# Patient Record
Sex: Female | Born: 1960 | Race: White | Hispanic: No | Smoking: Former smoker
Health system: Southern US, Community
[De-identification: ages and names within clinical notes are randomized; demographics above are authoritative.]

## PROBLEM LIST (undated history)

## (undated) DIAGNOSIS — K219 Gastro-esophageal reflux disease without esophagitis: Secondary | ICD-10-CM

## (undated) DIAGNOSIS — F32A Depression, unspecified: Secondary | ICD-10-CM

## (undated) DIAGNOSIS — Z8719 Personal history of other diseases of the digestive system: Secondary | ICD-10-CM

## (undated) DIAGNOSIS — M1990 Unspecified osteoarthritis, unspecified site: Secondary | ICD-10-CM

## (undated) DIAGNOSIS — F419 Anxiety disorder, unspecified: Secondary | ICD-10-CM

## (undated) DIAGNOSIS — C801 Malignant (primary) neoplasm, unspecified: Secondary | ICD-10-CM

## (undated) HISTORY — PX: CERVIX SURGERY: SHX593

## (undated) HISTORY — PX: LASIK: SHX215

---

## 1998-03-03 ENCOUNTER — Other Ambulatory Visit: Admission: RE | Admit: 1998-03-03 | Discharge: 1998-03-03 | Payer: Self-pay | Admitting: Obstetrics & Gynecology

## 1999-07-07 ENCOUNTER — Other Ambulatory Visit: Admission: RE | Admit: 1999-07-07 | Discharge: 1999-07-07 | Payer: Self-pay | Admitting: Obstetrics & Gynecology

## 2000-03-14 ENCOUNTER — Ambulatory Visit (HOSPITAL_COMMUNITY): Admission: RE | Admit: 2000-03-14 | Discharge: 2000-03-14 | Payer: Self-pay | Admitting: *Deleted

## 2000-08-13 ENCOUNTER — Other Ambulatory Visit: Admission: RE | Admit: 2000-08-13 | Discharge: 2000-08-13 | Payer: Self-pay | Admitting: Obstetrics & Gynecology

## 2001-11-20 ENCOUNTER — Other Ambulatory Visit: Admission: RE | Admit: 2001-11-20 | Discharge: 2001-11-20 | Payer: Self-pay | Admitting: Obstetrics & Gynecology

## 2002-12-02 ENCOUNTER — Other Ambulatory Visit: Admission: RE | Admit: 2002-12-02 | Discharge: 2002-12-02 | Payer: Self-pay | Admitting: Obstetrics & Gynecology

## 2003-12-04 ENCOUNTER — Other Ambulatory Visit: Admission: RE | Admit: 2003-12-04 | Discharge: 2003-12-04 | Payer: Self-pay | Admitting: Obstetrics & Gynecology

## 2005-01-11 ENCOUNTER — Other Ambulatory Visit: Admission: RE | Admit: 2005-01-11 | Discharge: 2005-01-11 | Payer: Self-pay | Admitting: Obstetrics & Gynecology

## 2006-12-17 ENCOUNTER — Ambulatory Visit: Payer: Self-pay | Admitting: Internal Medicine

## 2006-12-17 LAB — CONVERTED CEMR LAB
Basophils Absolute: 0 10*3/uL (ref 0.0–0.1)
Basophils Relative: 0.7 % (ref 0.0–1.0)
Eosinophils Absolute: 0.2 10*3/uL (ref 0.0–0.6)
Eosinophils Relative: 3.6 % (ref 0.0–5.0)
HCT: 42.7 % (ref 36.0–46.0)
Hemoglobin: 14.8 g/dL (ref 12.0–15.0)
IgA: 175 mg/dL (ref 68–378)
IgE (Immunoglobulin E), Serum: 23.4 intl units/mL (ref 0.0–180.0)
IgG (Immunoglobin G), Serum: 1085 mg/dL (ref 694–1618)
Lymphocytes Relative: 34.9 % (ref 12.0–46.0)
MCHC: 34.7 g/dL (ref 30.0–36.0)
MCV: 88.4 fL (ref 78.0–100.0)
Monocytes Absolute: 0.5 10*3/uL (ref 0.2–0.7)
Monocytes Relative: 7.9 % (ref 3.0–11.0)
Neutro Abs: 3.3 10*3/uL (ref 1.4–7.7)
Neutrophils Relative %: 52.9 % (ref 43.0–77.0)
Platelets: 191 10*3/uL (ref 150–400)
RBC: 4.83 M/uL (ref 3.87–5.11)
RDW: 13.9 % (ref 11.5–14.6)
Tissue Transglutaminase Ab, IgA: <3 units (ref <5)
WBC: 6.2 10*3/uL (ref 4.5–10.5)

## 2006-12-20 ENCOUNTER — Encounter (INDEPENDENT_AMBULATORY_CARE_PROVIDER_SITE_OTHER): Payer: Self-pay | Admitting: Specialist

## 2006-12-20 ENCOUNTER — Ambulatory Visit: Payer: Self-pay | Admitting: Internal Medicine

## 2008-08-07 ENCOUNTER — Encounter: Admission: RE | Admit: 2008-08-07 | Discharge: 2008-08-07 | Payer: Self-pay | Admitting: Obstetrics & Gynecology

## 2009-08-09 ENCOUNTER — Encounter: Admission: RE | Admit: 2009-08-09 | Discharge: 2009-08-09 | Payer: Self-pay | Admitting: Obstetrics & Gynecology

## 2010-09-06 ENCOUNTER — Ambulatory Visit (HOSPITAL_COMMUNITY)
Admission: RE | Admit: 2010-09-06 | Discharge: 2010-09-06 | Payer: Self-pay | Source: Home / Self Care | Admitting: Internal Medicine

## 2010-09-26 ENCOUNTER — Encounter (HOSPITAL_COMMUNITY)
Admission: RE | Admit: 2010-09-26 | Discharge: 2010-10-26 | Payer: Self-pay | Source: Home / Self Care | Attending: Internal Medicine | Admitting: Internal Medicine

## 2011-03-07 ENCOUNTER — Emergency Department (HOSPITAL_COMMUNITY): Payer: 59

## 2011-03-07 ENCOUNTER — Emergency Department (HOSPITAL_COMMUNITY)
Admission: EM | Admit: 2011-03-07 | Discharge: 2011-03-07 | Disposition: A | Discharge status: Home / Self Care | Payer: 59 | Attending: Emergency Medicine | Admitting: Emergency Medicine

## 2011-03-07 DIAGNOSIS — Z79899 Other long term (current) drug therapy: Secondary | ICD-10-CM | POA: Insufficient documentation

## 2011-03-07 DIAGNOSIS — R079 Chest pain, unspecified: Secondary | ICD-10-CM | POA: Insufficient documentation

## 2011-03-07 LAB — CBC
HCT: 40 % (ref 36.0–46.0)
MCHC: 33.3 g/dL (ref 30.0–36.0)
MCV: 87 fL (ref 78.0–100.0)
Platelets: 221 10*3/uL (ref 150–400)
RBC: 4.6 MIL/uL (ref 3.87–5.11)
RDW: 14 % (ref 11.5–15.5)
WBC: 4.7 10*3/uL (ref 4.0–10.5)

## 2011-03-07 LAB — BASIC METABOLIC PANEL
CO2: 22 mEq/L (ref 19–32)
Calcium: 9.2 mg/dL (ref 8.4–10.5)
Chloride: 106 mEq/L (ref 96–112)
Creatinine, Ser: 0.77 mg/dL (ref 0.4–1.2)
GFR calc Af Amer: >60 mL/min (ref >60)
GFR calc non Af Amer: >60 mL/min (ref >60)
Glucose, Bld: 115 mg/dL — ABNORMAL HIGH (ref 70–99)
Sodium: 137 mEq/L (ref 135–145)

## 2011-03-07 LAB — POCT CARDIAC MARKERS
CKMB, poc: <1 ng/mL — ABNORMAL LOW (ref 1.0–8.0)
Myoglobin, poc: 33.3 ng/mL (ref 12–200)
Troponin i, poc: <0.05 ng/mL (ref 0.00–0.09)

## 2011-03-07 LAB — DIFFERENTIAL
Basophils Absolute: 0 10*3/uL (ref 0.0–0.1)
Basophils Relative: 0 % (ref 0–1)
Eosinophils Absolute: 0.2 10*3/uL (ref 0.0–0.7)
Eosinophils Relative: 4 % (ref 0–5)
Lymphocytes Relative: 45 % (ref 12–46)
Lymphs Abs: 2.1 10*3/uL (ref 0.7–4.0)
Monocytes Absolute: 0.3 10*3/uL (ref 0.1–1.0)
Monocytes Relative: 7 % (ref 3–12)

## 2011-03-31 NOTE — Procedures (Signed)
[place]. [facility]  Patient:    Caitlyn Torres, Caitlyn Torres                     MRN: [id number] Proc. Date: 03/14/00 Adm. Date:  [id number] Attending:  Sabino Gasser CC:         Dr. _______________             Guy Franco, P.A., [facility], [place]                           Procedure Report  ADDENDUM:  [name] tells me that her upper abdominal discomfort has been improved fairly significantly and at this point with negative examinations would not plan any other work-up at this point.  But, I did tell  her that if it has not totally cleared over the next month for her to return to my office for further consideration of this discomfort. DD:  03/14/00 TD:  03/15/00 Job: [id number]

## 2011-03-31 NOTE — Procedures (Signed)
[place]  Patient:    Caitlyn Torres, Caitlyn Torres                     MRN: [id number] Proc. Date: 03/14/00 Adm. Date:  [id number] Attending:  Sabino Gasser Dictator:   Sabino Gasser, M.D.             Guy Franco, P.A. at [facility] in [place]                           Procedure Report  PROCEDURE:  Colonoscopy.  INDICATIONS:  Hemoccult positive stools.  ANESTHESIA:  Versed 2.5 mg was given additionally.  DESCRIPTION OF PROCEDURE:  With the patient mildly sedated in the left lateral decubitus position, a rectal examination was performed which was unremarkable. Subsequently, the Olympus videoscopic pediatric colonoscope was inserted into the rectum and passed under direct vision to the cecum.  The cecum was identified by the ileocecal valve and appendiceal orifice, both of which were photographed.  We then entered into the terminal ileum via the ileocecal valve, and this appeared normal as well and was photographed.  From this point the endoscope was then slowly withdrawn, taking circumferential views of the entire colonic mucosa, stopping only in the rectum which appeared normal in direct view, and showed internal hemorrhoids on retroflex view.  The endoscope was straightened and withdrawn.  The patients vital signs and pulse oximetry remained stable.  The patient tolerated the procedure well without apparent complications.  FINDINGS:  Internal hemorrhoids, otherwise unremarkable colonoscopic examination, including the terminal ileum.  PLAN:  We will have the patient follow up with me as needed.  Negative essentially endoscopic examinations, including terminal ileum.  Etiology of Hemoccult positive not clear, but not related to any organic cause other than possibly internal hemorrhoids.  Consider repeat colon cancer screening in this lady in 5-10 years. DD:  03/14/00 TD:  03/15/00 Job: [id number]

## 2011-03-31 NOTE — Procedures (Signed)
[name]  Patient:    Caitlyn Torres, Caitlyn Torres                     MRN: [id number] Proc. Date: 03/14/00 Adm. Date:  [id number] Attending:  Sabino Gasser Dictator:   Sabino Gasser, M.D.             Guy Franco, P.A. at [facility] in [place]                           Procedure Report  PROCEDURE:  Upper endoscopy.  INDICATIONS:  Abdominal pain, Hemoccult positive stools.  ANESTHESIA: 1. Demerol 50 mg intravenously in divided dose. 2. Versed 5 mg intravenously in divided dose.  DESCRIPTION OF PROCEDURE:  With the patient mildly sedated in the left lateral decubitus position the Olympus videoscopic endoscope was inserted in the mouth and passed under direct vision through the esophagus, which appeared normal, into the stomach.  The antrum, duodenal bulb, second portion of the duodenum were approached.  All appeared normal and were photographed.  From this point, the endoscope was slowly withdrawn, taken circumferentially viewing the entire duodenum and mucosa until the endoscope had been pulled back into the stomach, placed in retroflexion to view the stomach from below, and this appeared normal and was photographed.  The endoscope was then straightened and pulled back from distal to proximal stomach, taking circumferential views the entire gastric and subsequently esophageal mucosa, all of which appeared normal.  The patients vital signs and pulse oximetry remained stable.  The patient tolerated the procedure well without apparent complications.  FINDINGS:  Essentially endoscopic examination.  PLAN:  We will proceed with colonoscopy as previously planned. DD:  03/14/00 TD:  03/15/00 Job: [id number]

## 2011-03-31 NOTE — Assessment & Plan Note (Signed)
[facility]                         GASTROENTEROLOGY OFFICE NOTE   JAELYNN, POZO                     MRN:          [id number]  DATE:12/17/2006                            DOB:          1961-01-12    Ms. Caitlyn Torres is a very nice 50 year old white female who is here today for  evaluation of gastroesophageal reflux. It seems to be associated with a  rash, and evaluated by Dr. Willa Rough who  feels it qualifies as idiopathic  urticaria. The patient almost on a daily basis breaks out with pruritic  rash on her upper extremities and torso, which is not related to food.  She has been tested for allergy and is current on Zyrtec 10 mg a day  with complete relief of her symptoms .She also takes ranitidine 150 mg a  day, sometimes twice a day as a H2 receptor antago nist. Zantac also  reduces her  symptoms of reflux. She has been experiencing more  heartburn in mid-chest and substernally, not radiating to the back.  There has been no dysphagia or odynophagia. She has occasional diarrhea.  No family history of Crohn's disease or inflammatory bowel disease. The  patient had gastroesophageal reflux several years ago when she was  evaluated by [facility]. She does not remember name of  the gastroenterologist, but she underwent upper endoscopy and  colonoscopy around 2002. No definite diagnosis was made. She has  multiple allergies including ASPIRIN, TYLENOL, AND IBUPROFEN. Another  complaint has been hemorrhoids.   PAST MEDICAL HISTORY:  Significant for:  1. Depression.  2. Hyperlipidemia.  3. Anxiety.   PAST SURGICAL HISTORY:  No operations.   FAMILY HISTORY:  Negative for colon cancer, inflammatory bowel disease.  There is a hx of alcoholism in maternal uncle and heart disease in  mother and father.   SOCIAL HISTORY:  She is married, has one child. She works as a Armed forces logistics/support/administrative officer. She smokes, but does not drink alcohol more than  socially.   REVIEW OF SYSTEMS:  Positive for allergies, urinary changes, arthritic  complaints, skin rashes as described in History of Present Illness.  Heart rhythm problems, back pain, itching, severe fatigue and leakage of  urine.   PHYSICAL EXAMINATION:  Blood pressure is 102/72, pulse 80 and weight 121  pounds. She was slim, alert and oriented, in no distress.  Sclerae anicteric. Oral cavity is normal. The tongue was papillated;  there were no cheilosis.  There was a pruritic maculo-papular rash on the upper extremities and  torso, which appeared like urticaria.  LUNGS: Clear to auscultation.  COR: Normal S1, normal S2.  ABDOMEN: Soft, with normoactive bowel sounds. No tenderness. Liver edge  at costal margin. Lower abdomen was normal. No palpable masses, stool.  RECTAL: Is normal rectal tone. Stool was soft, Hemoccult negative, no  external hemorrhoids   IMPRESSION:  A 50 year old white female with a history of  gastroesophageal reflux disease, recent exacerbation of a burning  sensation, poorly controlled on ranitidine 150 mg a day. There is no  dysphagia to suggest esophageal stricture. She  has an idiopathic  urticaria  controlled  with Zyrtec .We need to rule out possibility of  eosinophilic gastroenteritis or some sort of an allergic condition such  as sprue, which may be associated with rash, dermatitis herpetiformis.  Her H-pylori test was negative in the past.   PLAN:  1. Tissue  transglutaminase levels as well as total eosinophil count      today.  2. IGE levels.  3. Upper endoscopy scheduled with planned biopsy of the small bowel as      well as of the stomach and esophagus.  4. Samples of Prilosec 20 mg daily as a proton pump inhibitor to treat      her reflux symptoms.     [name]. Juanda Chance, MD  Electronically Signed    DMB/MedQ  DD: 12/17/2006  DT: 12/17/2006  Job #: [id number]   cc:   Meliton Rattan. Willa Rough, M.D.  Corrie Mckusick, M.D.

## 2011-09-15 ENCOUNTER — Other Ambulatory Visit: Payer: Self-pay | Admitting: Obstetrics & Gynecology

## 2016-11-01 ENCOUNTER — Other Ambulatory Visit: Payer: Self-pay | Admitting: Obstetrics & Gynecology

## 2016-11-01 DIAGNOSIS — R928 Other abnormal and inconclusive findings on diagnostic imaging of breast: Secondary | ICD-10-CM

## 2016-11-07 ENCOUNTER — Other Ambulatory Visit: Payer: Self-pay | Admitting: Obstetrics & Gynecology

## 2016-11-07 ENCOUNTER — Ambulatory Visit
Admission: RE | Admit: 2016-11-07 | Discharge: 2016-11-07 | Disposition: A | Discharge status: Home / Self Care | Payer: BC Managed Care – PPO | Source: Ambulatory Visit | Attending: Obstetrics & Gynecology | Admitting: Obstetrics & Gynecology

## 2016-11-07 DIAGNOSIS — R928 Other abnormal and inconclusive findings on diagnostic imaging of breast: Secondary | ICD-10-CM

## 2019-10-07 ENCOUNTER — Other Ambulatory Visit: Payer: Self-pay | Admitting: Obstetrics & Gynecology

## 2019-10-07 DIAGNOSIS — N6310 Unspecified lump in the right breast, unspecified quadrant: Secondary | ICD-10-CM

## 2019-10-16 ENCOUNTER — Other Ambulatory Visit: Payer: BC Managed Care – PPO

## 2019-10-17 ENCOUNTER — Ambulatory Visit
Admission: RE | Admit: 2019-10-17 | Discharge: 2019-10-17 | Disposition: A | Discharge status: Home / Self Care | Payer: BC Managed Care – PPO | Source: Ambulatory Visit | Attending: Obstetrics & Gynecology | Admitting: Obstetrics & Gynecology

## 2019-10-17 ENCOUNTER — Other Ambulatory Visit: Payer: Self-pay

## 2019-10-17 DIAGNOSIS — N6310 Unspecified lump in the right breast, unspecified quadrant: Secondary | ICD-10-CM

## 2022-02-02 ENCOUNTER — Other Ambulatory Visit: Payer: Self-pay | Admitting: Obstetrics & Gynecology

## 2022-02-02 DIAGNOSIS — R928 Other abnormal and inconclusive findings on diagnostic imaging of breast: Secondary | ICD-10-CM

## 2022-02-24 LAB — COLOGUARD: COLOGUARD: NEGATIVE

## 2022-02-28 ENCOUNTER — Ambulatory Visit
Admission: RE | Admit: 2022-02-28 | Discharge: 2022-02-28 | Disposition: A | Discharge status: Home / Self Care | Payer: BC Managed Care – PPO | Source: Ambulatory Visit | Attending: Obstetrics & Gynecology | Admitting: Obstetrics & Gynecology

## 2022-02-28 ENCOUNTER — Other Ambulatory Visit: Payer: Self-pay | Admitting: Obstetrics & Gynecology

## 2022-02-28 DIAGNOSIS — R599 Enlarged lymph nodes, unspecified: Secondary | ICD-10-CM

## 2022-02-28 DIAGNOSIS — N6320 Unspecified lump in the left breast, unspecified quadrant: Secondary | ICD-10-CM

## 2022-02-28 DIAGNOSIS — R928 Other abnormal and inconclusive findings on diagnostic imaging of breast: Secondary | ICD-10-CM

## 2022-03-09 ENCOUNTER — Other Ambulatory Visit: Payer: BC Managed Care – PPO

## 2022-03-13 DIAGNOSIS — C801 Malignant (primary) neoplasm, unspecified: Secondary | ICD-10-CM

## 2022-03-13 HISTORY — DX: Malignant (primary) neoplasm, unspecified: C80.1

## 2022-03-14 ENCOUNTER — Ambulatory Visit
Admission: RE | Admit: 2022-03-14 | Discharge: 2022-03-14 | Disposition: A | Discharge status: Home / Self Care | Payer: BC Managed Care – PPO | Source: Ambulatory Visit | Attending: Obstetrics & Gynecology | Admitting: Obstetrics & Gynecology

## 2022-03-14 DIAGNOSIS — R599 Enlarged lymph nodes, unspecified: Secondary | ICD-10-CM

## 2022-03-14 DIAGNOSIS — N6320 Unspecified lump in the left breast, unspecified quadrant: Secondary | ICD-10-CM

## 2022-03-21 ENCOUNTER — Telehealth: Payer: Self-pay | Admitting: Hematology and Oncology

## 2022-03-21 NOTE — Telephone Encounter (Signed)
Scheduled appt per 5/9 staff msg from nurse navigator. Pt is aware of appt date and time. Pt is aware to arrive 15 mins prior to appt time and to bring and updated insurance card. Pt is aware of appt location.   ?

## 2022-03-27 ENCOUNTER — Ambulatory Visit: Payer: Self-pay | Admitting: Surgery

## 2022-03-29 ENCOUNTER — Encounter: Payer: Self-pay | Admitting: Radiology

## 2022-03-29 ENCOUNTER — Inpatient Hospital Stay: Payer: BC Managed Care – PPO | Attending: Hematology and Oncology | Admitting: Hematology and Oncology

## 2022-03-29 ENCOUNTER — Other Ambulatory Visit: Payer: Self-pay

## 2022-03-29 VITALS — BP 148/78 | HR 75 | Temp 97.7°F | Resp 18 | Wt 122.4 lb

## 2022-03-29 DIAGNOSIS — Z170 Estrogen receptor positive status [ER+]: Secondary | ICD-10-CM | POA: Insufficient documentation

## 2022-03-29 DIAGNOSIS — C50412 Malignant neoplasm of upper-outer quadrant of left female breast: Secondary | ICD-10-CM | POA: Diagnosis present

## 2022-03-29 MED ORDER — LIDOCAINE-PRILOCAINE 2.5-2.5 % EX CREA: TOPICAL_CREAM | CUTANEOUS | 3 refills | Status: DC

## 2022-03-29 MED ORDER — DEXAMETHASONE 4 MG PO TABS: 4.0000 mg | ORAL_TABLET | Freq: Every day | ORAL | 0 refills | Status: DC

## 2022-03-29 MED ORDER — PROCHLORPERAZINE MALEATE 10 MG PO TABS: 10.0000 mg | ORAL_TABLET | Freq: Four times a day (QID) | ORAL | 1 refills | Status: DC | PRN

## 2022-03-29 MED ORDER — ONDANSETRON HCL 8 MG PO TABS: 8.0000 mg | ORAL_TABLET | Freq: Two times a day (BID) | ORAL | 1 refills | Status: DC | PRN

## 2022-03-29 MED ORDER — LETROZOLE 2.5 MG PO TABS: 2.5000 mg | ORAL_TABLET | Freq: Every day | ORAL | 3 refills | Status: DC

## 2022-03-29 NOTE — Progress Notes (Signed)
[place] ?CONSULT NOTE ? ?Patient Care Team: ?Bradd Canary, MD as PCP - General (Family Medicine) ? ?CHIEF COMPLAINTS/PURPOSE OF CONSULTATION:  ?Newly diagnosed breast cancer ? ?HISTORY OF PRESENTING ILLNESS:  ?Caitlyn Torres 61 y.o. female is here because of recent diagnosis of left breast cancer.  She had a screening mammogram that detected 2 breast masses 1.5 and 1.3 cm with enlarged left axillary lymph node.  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  She is here accompanied by her husband and an patient of mine to discuss the treatment plan. ? ?I reviewed her records extensively and collaborated the history with the patient. ? ?SUMMARY OF ONCOLOGIC HISTORY: ?Oncology History  ?Malignant neoplasm of upper-outer quadrant of left breast in female, estrogen receptor positive ([place])  ?03/14/2022 Initial Diagnosis  ? Screening detected left breast masses 1.5 cm and 1.3 cm with enlarged left axillary lymph node, biopsy revealed grade 3 IDC with lymph node being positive, ER 95%, PR 0%, HER2 2+ by IHC and FISH positive with a ratio 2.45 and a copy #4.9, the second biopsy was HER2 negative with a ratio 2.15 and a copy #3.55 ?  ? ? ? ?MEDICAL HISTORY:  ? anxiety, arthritis ? ?SURGICAL HISTORY: ?  [place]  ? Skin Cancer 08/2020  ? ?Allergies  ?Allergen Reactions  ? Acetaminophen Swelling  ? Aspirin Swelling  ? Nsaids (Non-Steroidal Anti-Inflammatory Drug) Swelling  ? Cpm-Diphenhyd-Pse-Acetaminophn Hives  ? Ibuprofen Unknown  ? Macrolide Antibiotics Other (See Comments)  ?Flu like sx  ? Naproxen Sodium Unknown  ? Nitrofurantoin Monohyd/M-Cryst Other (See Comments) and Unknown  ?Flu like symptoms, fever 103 ? ? ?Current Outpatient Medications on File Prior to Visit  ?Medication Sig Dispense Refill  ? biotin 10 mg Tab Take by mouth  ? cetirizine (ZYRTEC) 10 MG tablet Take 10 mg by mouth once  daily  ? EPINEPHrine (EPIPEN) 0.3 mg/0.3 mL auto-injector as directed  ? multivitamin tablet Take 1 tablet by mouth once daily  ? ?No current facility-administered medications on file prior to visit.  ? ?History reviewed. No pertinent family history.  ? ?REVIEW OF SYSTEMS:   ?Constitutional: Denies fevers, chills or abnormal night sweats ?  ?All other systems were reviewed with the patient and are negative. ? ?PHYSICAL EXAMINATION: ?ECOG PERFORMANCE STATUS: 1 - Symptomatic but completely ambulatory ? ?Vitals:  ? 03/29/22 1458  ?BP: (!) 148/78  ?Pulse: 75  ?Resp: 18  ?Temp: 97.7 ?F (36.5 ?C)  ?SpO2: 100%  ? ?Filed Weights  ? 03/29/22 1458  ?Weight: 122 lb 6.4 oz (55.5 kg)  ? ?  ? ?LABORATORY DATA:  ?I have reviewed the data as listed ?Lab Results  ?Component Value Date  ? WBC 4.7 03/07/2011  ? HGB 13.3 03/07/2011  ? HCT 40.0 03/07/2011  ? MCV 87.0 03/07/2011  ? PLT 221 03/07/2011  ? ?Lab Results  ?Component Value Date  ? NA 137 03/07/2011  ? K 3.7 03/07/2011  ? CL 106 03/07/2011  ? CO2 22 03/07/2011  ? ? ?RADIOGRAPHIC STUDIES: ?I have personally reviewed the radiological reports and agreed with the findings in the report. ? ?ASSESSMENT AND PLAN:  ?Malignant neoplasm of upper-outer quadrant of left breast in female, estrogen receptor positive ([place]) ?03/14/2022:Screening detected left breast masses 1.5 cm and 1.3 cm with enlarged left axillary lymph node, biopsy revealed grade 3 IDC with lymph node being positive, ER 95%, PR  0%, HER2 2+ by IHC and FISH positive with a ratio 2.45 and a copy #4.9, the second biopsy was HER2 negative with a ratio 2.15 and a copy #3.55 ? ?Pathology and radiology counseling: Discussed with the patient, the details of pathology including the type of breast cancer,the clinical staging, the significance of ER, PR and HER-2/neu receptors and the implications for treatment. After reviewing the pathology in detail, we proceeded to discuss the different treatment options between surgery, radiation,  chemotherapy, antiestrogen therapies. ? ?Recommendation based on multidisciplinary tumor board: ?1. Neoadjuvant chemotherapy with [place] ?6 cycles followed by Herceptin Perjeta maintenance versus Kadcyla maintenance (based on response to neoadjuvant chemo) for 1 year ?2. Followed by breast conserving surgery if possible with sentinel lymph node study ?3. Followed by adjuvant radiation therapy if patient had lumpectomy ?4.  Adjuvant antiestrogen therapy with neratinib ? ?Chemotherapy Counseling: I discussed the risks and benefits of chemotherapy including the risks of nausea/ vomiting, risk of infection from low WBC count, fatigue due to chemo or anemia, bruising or bleeding due to low platelets, mouth sores, loss/ change in taste and decreased appetite. Liver and kidney function will be monitored through out chemotherapy as abnormalities in liver and kidney function may be a side effect of treatment. Cardiac dysfunction due to Herceptin and Perjeta and neuropathy risk from Taxotere were discussed in detail. Risk of permanent bone marrow dysfunction due to chemo were also discussed. ? ?Plan: ?1. Port placement ?2. Echocardiogram ?3. Chemotherapy class ?4. Breast MRI ? ?I encouraged her to participate in the nausea clinical trial ?  ?She is currently moving from [place] to [place]. ?Therefore we will start her on letrozole today. ?Return to clinic in 2 weeks to start chemotherapy. ? ? ? ? ?All questions were answered. The patient knows to call the clinic with any problems, questions or concerns. ?  ? Harriette Ohara, MD ?03/29/22 ? I Gardiner Coins am scribing for Dr. Lindi Adie ? ?I have reviewed the above documentation for accuracy and completeness, and I agree with the above. ?  ?

## 2022-03-29 NOTE — Progress Notes (Signed)
START ON PATHWAY REGIMEN - Breast     Cycle 1: A cycle is 21 days:     Pertuzumab      Trastuzumab-xxxx      Docetaxel      Carboplatin    Cycles 2 through 6: A cycle is every 21 days:     Pertuzumab      Trastuzumab-xxxx      Docetaxel      Carboplatin   **Always confirm dose/schedule in your pharmacy ordering system**  Patient Characteristics: Preoperative or Nonsurgical Candidate (Clinical Staging), Neoadjuvant Therapy followed by Surgery, Invasive Disease, Chemotherapy, HER2 Positive, ER Positive Therapeutic Status: Preoperative or Nonsurgical Candidate (Clinical Staging) AJCC M Category: cM0 AJCC Grade: G3 Breast Surgical Plan: Neoadjuvant Therapy followed by Surgery ER Status: Positive (+) AJCC 8 Stage Grouping: IIA HER2 Status: Positive (+) AJCC T Category: cT1c AJCC N Category: cN1 PR Status: Negative (-) Intent of Therapy: Curative Intent, Discussed with Patient 

## 2022-03-29 NOTE — Research (Signed)
[id number] - TREATMENT OF REFRACTORY NAUSEA ? ?03/29/2022 ? ?[place] was identified by Dr. Lindi Adie as a potential candidate for the above listed study.  This Clinical Research Coordinator met with VIKA BUSKE, [id number], on 03/29/22 in a manner and location that ensures patient privacy to discuss participation in the above listed research study.  Patient is Accompanied by her husband and daughters .  A copy of the informed consent document and separate HIPAA Authorization was provided to the patient.  Patient reads, speaks, and understands [place]. Patient was provided with the business card of this Coordinator and encouraged to contact the research team with any questions.  Approximately 10 minutes were spent with the patient reviewing the informed consent documents.  Patient was provided the option of taking informed consent documents home to review and was encouraged to review at their convenience with their support network, including other care providers. Patient took the consent documents home to review. Informed patient this coordinator will follow up at a later date. Patient and family were thanked for their time. This coordinator will review chart to ensure patient meets eligibility if she is interested in participation.  ? ?Carol Ada, RT(R)(T) ?Clinical Research Coordinator ? ?

## 2022-03-29 NOTE — Assessment & Plan Note (Signed)
03/14/2022:Screening detected left breast masses 1.5 cm and 1.3 cm with enlarged left axillary lymph node, biopsy revealed grade 3 IDC with lymph node being positive, ER 95%, PR 0%, HER2 2+ by IHC and FISH positive with a ratio 2.45 and a copy #4.9, the second biopsy was HER2 negative with a ratio 2.15 and a copy #3.55 ? ?Pathology and radiology counseling: Discussed with the patient, the details of pathology including the type of breast cancer,the clinical staging, the significance of ER, PR and HER-2/neu receptors and the implications for treatment. After reviewing the pathology in detail, we proceeded to discuss the different treatment options between surgery, radiation, chemotherapy, antiestrogen therapies. ? ?Recommendation based on multidisciplinary tumor board: ?1. Neoadjuvant chemotherapy with [place] ?6 cycles followed by Herceptin Perjeta maintenance versus Kadcyla maintenance (based on response to neoadjuvant chemo) for 1 year ?2. Followed by breast conserving surgery if possible with sentinel lymph node study ?3. Followed by adjuvant radiation therapy if patient had lumpectomy ?4.  Adjuvant antiestrogen therapy with neratinib ? ?Chemotherapy Counseling: I discussed the risks and benefits of chemotherapy including the risks of nausea/ vomiting, risk of infection from low WBC count, fatigue due to chemo or anemia, bruising or bleeding due to low platelets, mouth sores, loss/ change in taste and decreased appetite. Liver and kidney function will be monitored through out chemotherapy as abnormalities in liver and kidney function may be a side effect of treatment. Cardiac dysfunction due to Herceptin and Perjeta and neuropathy risk from Taxotere were discussed in detail. Risk of permanent bone marrow dysfunction due to chemo were also discussed. ? ?Plan: ?1. Port placement ?2. Echocardiogram ?3. Chemotherapy class ?4. Breast MRI ? ?I encouraged her to participate in the nausea clinical trial ?I also  encouraged her to participate in the blood draw study ? ?Return to clinic in 2 weeks to start chemotherapy. ? ? ? ?

## 2022-03-30 ENCOUNTER — Telehealth: Payer: Self-pay | Admitting: Genetic Counselor

## 2022-03-30 ENCOUNTER — Telehealth: Payer: Self-pay | Admitting: *Deleted

## 2022-03-30 ENCOUNTER — Encounter: Payer: Self-pay | Admitting: *Deleted

## 2022-03-30 DIAGNOSIS — Z170 Estrogen receptor positive status [ER+]: Secondary | ICD-10-CM

## 2022-03-30 NOTE — Telephone Encounter (Signed)
Spoke to pt regarding future appts. Scheduled and confirmed chemo class after echo. Confirmed remainder of scheduled appts. Denies questions or concerns regarding dx or treatment care plan. Encourage pt to call with needs. Received verbal understanding. Contact information provided.

## 2022-03-30 NOTE — Therapy (Signed)
OUTPATIENT PHYSICAL THERAPY BREAST CANCER BASELINE EVALUATION   Patient Name: Caitlyn Torres MRN: [id number] DOB:1960-12-30, 61 y.o., female Today's Date: 03/31/2022   PT End of Session - 03/31/22 0908     Visit Number 1    Number of Visits 2    Date for PT Re-Evaluation 09/29/22    PT Start Time 0901    PT Stop Time 0947    PT Time Calculation (min) 46 min    Activity Tolerance Patient tolerated treatment well    Behavior During Therapy [facility] for tasks assessed/performed             No past medical history on file. No past surgical history on file. Patient Active Problem List   Diagnosis Date Noted   Malignant neoplasm of upper-outer quadrant of left breast in female, estrogen receptor positive ([place]) 03/29/2022    PCP: Sidney Ace, MD  REFERRING PROVIDER: Nicholas Lose MD  REFERRING DIAG: Left Breast Cancer  THERAPY DIAG:  Malignant neoplasm of upper-outer quadrant of left female breast, unspecified estrogen receptor status ([place])  Abnormal posture  Rationale for Evaluation and Treatment Rehabilitation  ONSET DATE: 03/14/2022  SUBJECTIVE                                                                                                                                                                                           SUBJECTIVE STATEMENT: Patient reports she is here today to be seen by her medical team for her newly diagnosed left breast cancer.   PERTINENT HISTORY:  03/14/2022:Screening detected left breast masses 1.5 cm and 1.3 cm with enlarged left axillary lymph node, biopsy revealed grade 3 IDC with lymph node being positive, ER 95%, PR 0%, HER2 2+ by IHC and FISH positive with a ratio 2.45 and a copy #4.9, the second biopsy was HER2 negative with a ratio 2.15 and a copy #3.55. based on multidisciplinary tumor board: 1. Neoadjuvant chemotherapy with TCH Perjeta 6 cycles followed by Herceptin Perjeta maintenance versus Kadcyla maintenance (based on  response to neoadjuvant chemo) for 1 year 2. Followed by breast conserving surgery if possible with sentinel lymph node study 3. Followed by adjuvant radiation therapy if patient had lumpectomy 4.  Adjuvant antiestrogen therapy with neratinib    PATIENT GOALS   reduce lymphedema risk and learn post op HEP.   PAIN:  Are you having pain? No   PRECAUTIONS: Active CA None  HAND DOMINANCE: right  WEIGHT BEARING RESTRICTIONS No  FALLS:  Has patient fallen in last 6 months? No  LIVING ENVIRONMENT: Patient lives with: partner Lives in: House/apartment Has following equipment at home: none  OCCUPATION:  retired Electrical engineer, Human resources officer for [employer]: walking, out to eat  PRIOR LEVEL OF FUNCTION: Independent   OBJECTIVE  COGNITION:  Overall cognitive status: Within functional limits for tasks assessed    POSTURE:  Forward head and rounded shoulders posture  UPPER EXTREMITY AROM/PROM:  A/PROM RIGHT   eval   Shoulder extension 47  Shoulder flexion 163  Shoulder abduction 180  Shoulder internal rotation 67  Shoulder external rotation 107    (Blank rows = not tested)  A/PROM LEFT   eval  Shoulder extension 60  Shoulder flexion 165  Shoulder abduction 180  Shoulder internal rotation 57  Shoulder external rotation 107    (Blank rows = not tested)   CERVICAL AROM: All within normal limits:      UPPER EXTREMITY STRENGTH: WNL   LYMPHEDEMA ASSESSMENTS:   LANDMARK RIGHT   Eval 03/31/2022  10 cm proximal to olecranon process 24.0  Olecranon process 22.3  10 cm proximal to ulnar styloid process 20.9  Just proximal to ulnar styloid process 14.6  Across hand at thumb web space 19.3  At base of 2nd digit 5.9  (Blank rows = not tested)  LANDMARK LEFT   Eval 03/31/2022  10 cm proximal to olecranon process 23.6  Olecranon process 21.6  10 cm proximal to ulnar styloid process 19.5  Just proximal to ulnar styloid process 14.15  Across hand  at thumb web space 18.45  At base of 2nd digit 5.7  (Blank rows = not tested)   L-DEX LYMPHEDEMA SCREENING:  The patient was assessed using the L-Dex machine today to produce a lymphedema index baseline score. The patient will be reassessed on a regular basis (typically every 3 months) to obtain new L-Dex scores. If the score is > 6.5 points away from his/her baseline score indicating onset of subclinical lymphedema, it will be recommended to wear a compression garment for 4 weeks, 12 hours per day and then be reassessed. If the score continues to be > 6.5 points from baseline at reassessment, we will initiate lymphedema treatment. Assessing in this manner has a 95% rate of preventing clinically significant lymphedema.   L-DEX FLOWSHEETS - 03/31/22 0900       L-DEX LYMPHEDEMA SCREENING   Measurement Type Unilateral    L-DEX MEASUREMENT EXTREMITY Upper Extremity    POSITION  Standing    DOMINANT SIDE Right    At Risk Side Left    BASELINE SCORE (UNILATERAL) -2.1              QUICK DASH SURVEY:6.82   PATIENT EDUCATION:  Education details: Lymphedema risk reduction and post op shoulder/posture HEP, compression BRA, SOZO screens Person educated: Patient Education method: Explanation, Demonstration, Handout Education comprehension: Patient verbalized understanding and returned demonstration   HOME EXERCISE PROGRAM: Patient was instructed today in a home exercise program today for post op shoulder range of motion. These included active assist shoulder flexion in sitting, scapular retraction, wall walking with shoulder abduction, and hands behind head external rotation.  She was encouraged to do these twice a day, holding 3 seconds and repeating 5 times when permitted by her physician.   ASSESSMENT:  CLINICAL IMPRESSION: Her multidisciplinary medical team met prior to her assessments to determine a recommended treatment plan. She is planning to have neoadjuvant chemotherapy  followed by surgery planning a left lumpectomy with SLNB but is unsure right now and possible radiation. She will benefit from a post op PT reassessment to determine needs and from L-Dex screens  every 3 months for 2 years to detect subclinical lymphedema.  Pt will benefit from skilled therapeutic intervention to improve on the following deficits: Decreased knowledge of precautions, impaired UE functional use, pain, decreased ROM, postural dysfunction.   PT treatment/interventions: ADL/self-care home management, pt/family education, therapeutic exercise  REHAB POTENTIAL: Excellent  CLINICAL DECISION MAKING: Stable/uncomplicated  EVALUATION COMPLEXITY: Low   GOALS: Goals reviewed with patient? YES  LONG TERM GOALS: (STG=LTG)    Name Target Date Goal status  1 Pt will be able to verbalize understanding of pertinent lymphedema risk reduction practices relevant to her dx specifically related to skin care.  Baseline:  No knowledge 03/31/2022 Achieved at eval  2 Pt will be able to return demo and/or verbalize understanding of the post op HEP related to regaining shoulder ROM. Baseline:  No knowledge 03/31/2022 Achieved at eval  3 Pt will be able to verbalize understanding of the importance of attending the post op After Breast CA Class for further lymphedema risk reduction education and therapeutic exercise.  Baseline:  No knowledge 03/31/2022 Achieved at eval  4 Pt will demo she has regained full shoulder ROM and function post operatively compared to baselines.  Baseline: See objective measurements taken today. 09/29/2022 INITIAL     PLAN: PT FREQUENCY/DURATION: EVAL and 1 follow up appointment.   PLAN FOR NEXT SESSION: will reassess 3-4 weeks post op to determine needs.   Patient will follow up at outpatient cancer rehab 3-4 weeks following surgery.  If the patient requires physical therapy at that time, a specific plan will be dictated and sent to the referring physician for  approval. The patient was educated today on appropriate basic range of motion exercises to begin post operatively and the importance of attending the After Breast Cancer class following surgery.  Patient was educated today on lymphedema risk reduction practices as it pertains to recommendations that will benefit the patient immediately following surgery.  She verbalized good understanding.    Physical Therapy Information for After Breast Cancer Surgery/Treatment:  Lymphedema is a swelling condition that you may be at risk for in your arm if you have lymph nodes removed from the armpit area.  After a sentinel node biopsy, the risk is approximately 5-9% and is higher after an axillary node dissection.  There is treatment available for this condition and it is not life-threatening.  Contact your physician or physical therapist with concerns. You may begin the 4 shoulder/posture exercises (see additional sheet) when permitted by your physician (typically a week after surgery).  If you have drains, you may need to wait until those are removed before beginning range of motion exercises.  A general recommendation is to not lift your arms above shoulder height until drains are removed.  These exercises should be done to your tolerance and gently.  This is not a "no pain/no gain" type of recovery so listen to your body and stretch into the range of motion that you can tolerate, stopping if you have pain.  If you are having immediate reconstruction, ask your plastic surgeon about doing exercises as he or she may want you to wait. We encourage you to attend the free one time ABC (After Breast Cancer) class offered by [facility].  You will learn information related to lymphedema risk, prevention and treatment and additional exercises to regain mobility following surgery.  You can call [phone] for more information.  This is offered the 1st and 3rd Monday of each month.  You only attend  the  class one time. While undergoing any medical procedure or treatment, try to avoid blood pressure being taken or needle sticks from occurring on the arm on the side of cancer.   This recommendation begins after surgery and continues for the rest of your life.  This may help reduce your risk of getting lymphedema (swelling in your arm). An excellent resource for those seeking information on lymphedema is the National Lymphedema Network's web site. It can be accessed at [place].org If you notice swelling in your hand, arm or breast at any time following surgery (even if it is many years from now), please contact your doctor or physical therapist to discuss this.  Lymphedema can be treated at any time but it is easier for you if it is treated early on.  If you feel like your shoulder motion is not returning to normal in a reasonable amount of time, please contact your surgeon or physical therapist.  [facility] [phone]. [address], [place] [address]  ABC CLASS After Breast Cancer Class  After Breast Cancer Class is a specially designed exercise class to assist you in a safe recover after having breast cancer surgery.  In this class you will learn how to get back to full function whether your drains were just removed or if you had surgery a month ago.  This one-time class is held the 1st and 3rd Monday of every month from 11:00 a.m. until 12:00 noon virtually.  This class is FREE and space is limited. For more information or to register for the next available class, call [phone].  Class Goals  Understand specific stretches to improve the flexibility of you chest and shoulder. Learn ways to safely strengthen your upper body and improve your posture. Understand the warning signs of infection and why you may be at risk for an arm infection. Learn about Lymphedema and prevention.  ** You do not attend this class until after surgery.   Drains must be removed to participate  Patient was instructed today in a home exercise program today for post op shoulder range of motion. These included active assist shoulder flexion in sitting, scapular retraction, wall walking with shoulder abduction, and hands behind head external rotation.  She was encouraged to do these twice a day, holding 3 seconds and repeating 5 times when permitted by her physician.    Claris Pong, PT 03/31/2022, 9:56 AM

## 2022-03-30 NOTE — Progress Notes (Incomplete)
Location of Breast Cancer:  Malignant neoplasm of upper-outer quadrant of left breast in female, estrogen receptor positive  Histology per Pathology Report:  03/14/2022 1. Breast, left, needle core biopsy, 2:00 - INVASIVE DUCTAL CARCINOMA - SEE COMMENT 2. Breast, left, needle core biopsy, 2:30 - INVASIVE DUCTAL CARCINOMA - SEE COMMENT 3. Lymph node, needle/core biopsy, left axilla - METASTATIC CARCINOMA INVOLVING NODAL TISSUE Microscopic Comment 1. and 2. The carcinoma has a syncytial growth pattern. Based on the biopsy, the carcinoma appears Nottingham grade 3 of 3 and measures 1.0 cm in greatest linear extent.  Receptor Status:  Left breast, 2 o'clock: ER(95%), PR (0%), Her2-neu (Positive via FISH), Ki-67(95%) Left breast, 2:30: ER(95%), PR (0%), Her2-neu (Negative via FISH)  Did patient present with symptoms (if so, please note symptoms) or was this found on screening mammography?: Screening mammogram detected 2 breast masses 1.5 and 1.3 cm with enlarged left axillary lymph node.  Past/Anticipated interventions by surgeon, if any:  03/27/2022 --Dr. Erroll Luna (office visit) Patient is scheduled to see oncology on Wednesday. She appears to be a good neoadjuvant chemotherapy candidate. I discussed this with her today as well as port placement for administration of this.  I discussed breast conserving surgery with her, the management of the axilla in her circumstance given a positive node, as well as mastectomy and reconstruction.  I recommend genetic counseling as well for her given her sisters history as well as hers.  She will think all these options over.  Discussed port placement and the pros and cons of this and the rationale for placing a port for chemotherapy.  Risks and benefits of the surgery discussed.  Discussed breast conserving surgery as well with the goal of chemotherapy to downstage her disease.  She will see oncology and let me know about her final decision  when she is seen in the oncologist.  Past/Anticipated interventions by medical oncology, if any:  Under care of Dr. Nicholas Lose 03/29/2022 --Recommendation based on multidisciplinary tumor board: Neoadjuvant chemotherapy with [place] Perjeta 6 cycles followed by Herceptin Perjeta maintenance versus Kadcyla maintenance (based on response to neoadjuvant chemo) for 1 year Followed by breast conserving surgery if possible with sentinel lymph node study Followed by adjuvant radiation therapy if patient had lumpectomy Adjuvant antiestrogen therapy with neratinib --Plan: Port placement Echocardiogram Chemotherapy class Breast MRI Scheduled for 04/07/2022  I encouraged her to participate in the nausea clinical trial She is currently moving from [place] to [place] Therefore we will start her on letrozole today Return to clinic in 2 weeks to start chemotherapy  Lymphedema issues, if any:  ***    Pain issues, if any:  ***   SAFETY ISSUES: Prior radiation? *** Pacemaker/ICD? *** Possible current pregnancy? No--postmenopausal Is the patient on methotrexate? ***  Current Complaints / other details:  ***

## 2022-03-30 NOTE — Telephone Encounter (Signed)
Scheduled appointment per inbasket message. Left message.   ?

## 2022-03-31 ENCOUNTER — Ambulatory Visit: Payer: BC Managed Care – PPO | Attending: Hematology and Oncology

## 2022-03-31 DIAGNOSIS — C50412 Malignant neoplasm of upper-outer quadrant of left female breast: Secondary | ICD-10-CM | POA: Insufficient documentation

## 2022-03-31 DIAGNOSIS — Z170 Estrogen receptor positive status [ER+]: Secondary | ICD-10-CM | POA: Insufficient documentation

## 2022-03-31 DIAGNOSIS — R293 Abnormal posture: Secondary | ICD-10-CM | POA: Insufficient documentation

## 2022-04-03 ENCOUNTER — Ambulatory Visit: Payer: BC Managed Care – PPO

## 2022-04-03 ENCOUNTER — Telehealth: Payer: Self-pay | Admitting: Radiology

## 2022-04-03 ENCOUNTER — Ambulatory Visit: Payer: BC Managed Care – PPO | Admitting: Radiation Oncology

## 2022-04-03 NOTE — Telephone Encounter (Signed)
[id number] - TREATMENT OF REFRACTORY NAUSEA   04/03/2022  PHONE CALL: Confirmed I was speaking with Caitlyn Torres . Informed patient reason for call is to follow-up on the above mentioned study. Patient has not had a chance to read over consent, this clinical research coordinator expressed understanding. Patient requested a call back tomorrow around mid-day. Thanked patient for her time and I look forward to speaking with patient tomorrow.  Carol Ada, RT(R)(T) Clinical Research Coordinator

## 2022-04-04 ENCOUNTER — Encounter: Payer: Self-pay | Admitting: Licensed Clinical Social Worker

## 2022-04-04 ENCOUNTER — Encounter: Payer: Self-pay | Admitting: *Deleted

## 2022-04-04 NOTE — Progress Notes (Signed)
[facility] Work  Initial Assessment   Caitlyn Torres is a 61 y.o. year old female contacted by phone. Clinical Social Work was referred by  new pt protocol  for assessment of psychosocial needs.   SDOH (Social Determinants of Health) assessments performed: Yes SDOH Interventions    Flowsheet Row Most Recent Value  SDOH Interventions   Food Insecurity Interventions Intervention Not Indicated  Financial Strain Interventions Intervention Not Indicated  Housing Interventions Intervention Not Indicated  Transportation Interventions Intervention Not Indicated       SDOH Screenings   Alcohol Screen: Not on file  Depression (PHQ2-9): Not on file  Financial Resource Strain: Low Risk    Difficulty of Paying Living Expenses: Not very hard  Food Insecurity: No Food Insecurity   Worried About Running Out of Food in the Last Year: Never true   Ran Out of Food in the Last Year: Never true  Housing: Low Risk    Last Housing Risk Score: 0  Physical Activity: Not on file  Social Connections: Not on file  Stress: Not on file  Tobacco Use: Not on file  Transportation Needs: No Transportation Needs   Lack of Transportation (Medical): No   Lack of Transportation (Non-Medical): No     Distress Screen completed: No     View : No data to display.            Family/Social Information:  Housing Arrangement: patient lives with partner. They are moving from [place] to [place] prior to tx start (pt works in [employer], family is closer) Family members/support persons in your life? Partner, Family (daughter, parents) and Friends Transportation concerns: no  Employment: Working full time for [employer]. Also retired from working for the state.  Income source: Employment Nurse, children's concerns: No Type of concern: None Food access concerns: no Religious or spiritual practice: Not known Services Currently in place:  n/a  Coping/ Adjustment to  diagnosis: Patient understands treatment plan and what happens next? yes Concerns about diagnosis and/or treatment:  General adjustment to diagnosis Patient reported stressors: Housing- getting ready to move Current coping skills/ strengths: Capable of independent living  and Supportive family/friends     SUMMARY: Current SDOH Barriers:  No significant SDOH barriers noted today  Clinical Social Work Clinical Goal(s):  Patient will work with CSW to continue to adjust to cancer diagnosis and associated treatment  Interventions: Discussed common feeling and emotions when being diagnosed with cancer, and the importance of support during treatment Informed patient of the support team roles and support services at [facility] Provided [place] contact information and encouraged patient to call with any questions or concerns   Follow Up Plan: CSW will see patient on 04/11/2022 for counseling Patient verbalizes understanding of plan: Yes    [name], LCSW

## 2022-04-05 ENCOUNTER — Telehealth: Payer: Self-pay | Admitting: Hematology and Oncology

## 2022-04-05 ENCOUNTER — Telehealth: Payer: Self-pay | Admitting: Radiology

## 2022-04-05 DIAGNOSIS — Z170 Estrogen receptor positive status [ER+]: Secondary | ICD-10-CM

## 2022-04-05 NOTE — Telephone Encounter (Signed)
[id number] - TREATMENT OF REFRACTORY NAUSEA   04/04/22  PHONE CALL: Patient called this clinical research coordinator to follow-up on the above mentioned study. This coordinator answered all questions and explained the process of the study as well as the voluntary nature of study. Patient is interested in participation. Plan to meet with patient on Thursday, 5/25 at 1PM, after her morning appointments. Patient was thanked for her time and consideration of the above mentioned study and I look forward to speaking with patient then.   Carol Ada, RT(R)(T) Clinical Research Coordinator

## 2022-04-05 NOTE — Telephone Encounter (Signed)
.  Called patient to schedule appointment per 5/20 inbasket, patient is aware of date and time.

## 2022-04-06 ENCOUNTER — Inpatient Hospital Stay: Payer: BC Managed Care – PPO | Admitting: Radiology

## 2022-04-06 ENCOUNTER — Inpatient Hospital Stay (HOSPITAL_BASED_OUTPATIENT_CLINIC_OR_DEPARTMENT_OTHER): Payer: BC Managed Care – PPO | Admitting: Hematology and Oncology

## 2022-04-06 ENCOUNTER — Telehealth: Payer: Self-pay | Admitting: *Deleted

## 2022-04-06 ENCOUNTER — Inpatient Hospital Stay: Payer: BC Managed Care – PPO

## 2022-04-06 ENCOUNTER — Ambulatory Visit (HOSPITAL_COMMUNITY)
Admission: RE | Admit: 2022-04-06 | Discharge: 2022-04-06 | Disposition: A | Discharge status: Home / Self Care | Payer: BC Managed Care – PPO | Source: Ambulatory Visit | Attending: Hematology and Oncology | Admitting: Hematology and Oncology

## 2022-04-06 ENCOUNTER — Other Ambulatory Visit: Payer: Self-pay

## 2022-04-06 DIAGNOSIS — C50412 Malignant neoplasm of upper-outer quadrant of left female breast: Secondary | ICD-10-CM | POA: Insufficient documentation

## 2022-04-06 DIAGNOSIS — Z5111 Encounter for antineoplastic chemotherapy: Secondary | ICD-10-CM | POA: Diagnosis not present

## 2022-04-06 DIAGNOSIS — Z0189 Encounter for other specified special examinations: Secondary | ICD-10-CM

## 2022-04-06 DIAGNOSIS — Z170 Estrogen receptor positive status [ER+]: Secondary | ICD-10-CM | POA: Insufficient documentation

## 2022-04-06 LAB — ECHOCARDIOGRAM COMPLETE
AR max vel: 2.11 cm2
AV Area VTI: 2.21 cm2
AV Area mean vel: 2.09 cm2
AV Mean grad: 2 mmHg
AV Peak grad: 3.9 mmHg
Ao pk vel: 0.99 m/s
Area-P 1/2: 3.5 cm2
Calc EF: 63.8 %
S' Lateral: 2.7 cm
Single Plane A2C EF: 70.2 %
Single Plane A4C EF: 53.5 %

## 2022-04-06 NOTE — Telephone Encounter (Signed)
Pt called and requested to meet with Dr. Lindi Adie prior to start of chemo. She has many questions regarding chemo as well as if any other treatments could be added or done other than chemo. Informed pt we can schedule her to see Dr. Lindi Adie at 1pm after chemo education. Received verbal understanding.

## 2022-04-06 NOTE — Progress Notes (Signed)
Echocardiogram 2D Echocardiogram has been performed.  Caitlyn Torres 04/06/2022, 10:11 AM

## 2022-04-06 NOTE — Progress Notes (Signed)
Patient Care Team: Bradd Canary, MD as PCP - General (Family Medicine) Mauro Kaufmann, RN as Oncology Nurse Navigator Rockwell Germany, RN as Oncology Nurse Navigator Nicholas Lose, MD as Consulting Physician (Hematology and Oncology)  DIAGNOSIS:  Encounter Diagnosis  Name Primary?   Malignant neoplasm of upper-outer quadrant of left breast in female, estrogen receptor positive ([place])     SUMMARY OF ONCOLOGIC HISTORY: Oncology History  Malignant neoplasm of upper-outer quadrant of left breast in female, estrogen receptor positive ([place])  03/14/2022 Initial Diagnosis   Screening detected left breast masses 1.5 cm and 1.3 cm with enlarged left axillary lymph node, biopsy revealed grade 3 IDC with lymph node being positive, ER 95%, PR 0%, HER2 2+ by IHC and FISH positive with a ratio 2.45 and a copy #4.9, the second biopsy was HER2 negative with a ratio 2.15 and a copy #3.55   03/29/2022 Cancer Staging   Staging form: Breast, AJCC 8th Edition - Clinical: Stage IIA (cT1c, cN1, cM0, G3, ER+, PR-, HER2+) - Signed by Nicholas Lose, MD on 03/29/2022 Stage prefix: Initial diagnosis Histologic grading system: 3 grade system    04/20/2022 -  Chemotherapy   Patient is on Treatment Plan : BREAST  Docetaxel + Carboplatin + Trastuzumab + Pertuzumab  (TCHP) q21d         CHIEF COMPLIANT: Follow up to discuss [facility] Perjeta   INTERVAL HISTORY: Caitlyn Torres is a 61 y.o. female is here because of recent diagnosis of left breast cancer. She presents to the clinic today for a follow-up to discuss TCHP.  She tells me that she has tremendous number of allergies and therefore she is concerned about going through chemotherapy.  In fact she informed that she does not want to do chemotherapy and would rather only do the Herceptin Perjeta along with the letrozole that she is currently taking.  She is tolerating letrozole fairly well.  Therefore she has decided not to initiate chemotherapy but to start with  the Herceptin and Perjeta along with her letrozole and see what happens.  If she does not respond then she will go on chemo.    ALLERGIES:  is allergic to aleve [naproxen sodium], aspirin, macrobid [nitrofurantoin], motrin [ibuprofen], and tylenol [acetaminophen].  MEDICATIONS:  Current Outpatient Medications  Medication Sig Dispense Refill   carboxymethylcellulose (REFRESH PLUS) 0.5 % SOLN 1 drop 3 (three) times daily as needed (irritation).     cetirizine (ZYRTEC) 10 MG tablet Take 10 mg by mouth daily.     dexamethasone (DECADRON) 4 MG tablet Take 1 tablet (4 mg total) by mouth daily. Take 1 tablet day before chemo and 1 tablet day after chemo with food 12 tablet 0   EPINEPHrine 0.3 mg/0.3 mL IJ SOAJ injection Inject 0.3 mg into the muscle as needed for anaphylaxis.     letrozole (FEMARA) 2.5 MG tablet Take 1 tablet (2.5 mg total) by mouth daily. 90 tablet 3   lidocaine-prilocaine (EMLA) cream Apply to affected area once 30 g 3   olopatadine (PATANOL) 0.1 % ophthalmic solution Place 1 drop into both eyes daily as needed for allergies.     ondansetron (ZOFRAN) 8 MG tablet Take 1 tablet (8 mg total) by mouth 2 (two) times daily as needed (Nausea or vomiting). Start on the third day after chemotherapy. 30 tablet 1   prochlorperazine (COMPAZINE) 10 MG tablet Take 1 tablet (10 mg total) by mouth every 6 (six) hours as needed (Nausea or vomiting). 30 tablet 1  No current facility-administered medications for this visit.    PHYSICAL EXAMINATION: ECOG PERFORMANCE STATUS: 1 - Symptomatic but completely ambulatory  Vitals:   04/06/22 1309  BP: 139/80  Pulse: 63  Resp: 18  Temp: 97.7 F (36.5 C)  SpO2: 100%   Filed Weights   04/06/22 1309  Weight: 122 lb 4.8 oz (55.5 kg)     LABORATORY DATA:  I have reviewed the data as listed    Latest Ref Rng & Units 03/07/2011    5:41 PM  CMP  Glucose 70 - 99 mg/dL 115    BUN 6 - 23 mg/dL 11    Creatinine 0.4 - 1.2 mg/dL 0.77    Sodium 135  - 145 mEq/L 137    Potassium 3.5 - 5.1 mEq/L 3.7    Chloride 96 - 112 mEq/L 106    CO2 19 - 32 mEq/L 22    Calcium 8.4 - 10.5 mg/dL 9.2      Lab Results  Component Value Date   WBC 4.7 03/07/2011   HGB 13.3 03/07/2011   HCT 40.0 03/07/2011   MCV 87.0 03/07/2011   PLT 221 03/07/2011   NEUTROABS 2.0 03/07/2011    ASSESSMENT & PLAN:  Malignant neoplasm of upper-outer quadrant of left breast in female, estrogen receptor positive ([facility]) 03/14/2022:Screening detected left breast masses 1.5 cm and 1.3 cm with enlarged left axillary lymph node, biopsy revealed grade 3 IDC with lymph node being positive, ER 95%, PR 0%, HER2 2+ by IHC and FISH positive with a ratio 2.45 and a copy #4.9, the second biopsy was HER2 negative with a ratio 2.15 and a copy #3.55  Treatment plan based on multidisciplinary tumor board: 1. Neoadjuvant chemotherapy with Herceptin Perjeta and letrozole for 3 cycles.  Reassess with another MRI and then decide if she can continue with the same plan or do we need to add chemotherapy. (Our original recommendation is TCHP x6 cycles but patient is not willing to go through chemo right away) 2. Followed by breast conserving surgery if possible with sentinel lymph node study 3. Followed by adjuvant radiation therapy if patient had lumpectomy 4.  Adjuvant antiestrogen therapy with neratinib   Plan: 1. Port placement 2. Echocardiogram: Normal 3. Chemotherapy class: Done  4. Breast MRI: Tomorrow  CT CAP and bone scan also being planned  Patient will be seeing Dr. Iruku on her first day of her treatment.  She will go through her scans in detail. Patient's husband is focused on holistic approach to treatment. He wants to know all the ingredients in Herceptin and Perjeta.  I informed him that it may not be possible to find out.  Return to clinic in 2 weeks to start chemotherapy.      No orders of the defined types were placed in this encounter.  The patient has a good  understanding of the overall plan. she agrees with it. she will call with any problems that may develop before the next visit here. Total time spent: 30 mins including face to face time and time spent for planning, charting and co-ordination of care   Viinay K Gudena, MD 04/06/22    I Deritra, Mcnairy am scribing for Dr. Gudena  I have reviewed the above documentation for accuracy and completeness, and I agree with the above.   

## 2022-04-06 NOTE — Assessment & Plan Note (Addendum)
03/14/2022:Screening detected left breast masses 1.5 cm and 1.3 cm with enlarged left axillary lymph node, biopsy revealed grade 3 IDC with lymph node being positive, ER 95%, PR 0%, HER2 2+ by IHC and FISH positive with a ratio 2.45 and a copy #4.9, the second biopsy was HER2 negative with a ratio 2.15 and a copy #3.55  Treatment plan based on multidisciplinary tumor board: 1. Neoadjuvant chemotherapy with Herceptin Perjeta and letrozole for 3 cycles.  Reassess with another MRI and then decide if she can continue with the same plan or do we need to add chemotherapy. (Our original recommendation is TCHP x6 cycles but patient is not willing to go through chemo right away) 2. Followed by breast conserving surgery if possible with sentinel lymph node study 3. Followed by adjuvant radiation therapy if patient had lumpectomy 4.  Adjuvant antiestrogen therapy with neratinib   Plan: 1. Port placement 2. Echocardiogram: Normal 3. Chemotherapy class: Done  4. Breast MRI: Tomorrow  CT CAP and bone scan also being planned  Patient will be seeing Dr. Delma Officer on her first day of her treatment.  She will go through her scans in detail. Patient's husband is focused on holistic approach to treatment. He wants to know all the ingredients in Herceptin and Perjeta.  I informed him that it may not be possible to find out.  Return to clinic in 2 weeks to start chemotherapy.

## 2022-04-07 ENCOUNTER — Ambulatory Visit
Admission: RE | Admit: 2022-04-07 | Discharge: 2022-04-07 | Disposition: A | Discharge status: Home / Self Care | Payer: BC Managed Care – PPO | Source: Ambulatory Visit | Attending: Hematology and Oncology | Admitting: Hematology and Oncology

## 2022-04-07 ENCOUNTER — Telehealth: Payer: Self-pay | Admitting: Hematology and Oncology

## 2022-04-07 ENCOUNTER — Telehealth: Payer: Self-pay | Admitting: Radiology

## 2022-04-07 DIAGNOSIS — Z170 Estrogen receptor positive status [ER+]: Secondary | ICD-10-CM

## 2022-04-07 MED ORDER — GADOBUTROL 1 MMOL/ML IV SOLN
5.0000 mL | Freq: Once | INTRAVENOUS | Status: AC | PRN
  Administered 2022-04-07: 5 mL via INTRAVENOUS

## 2022-04-07 NOTE — Telephone Encounter (Signed)
Cancelled appointments per 5/25 los. Left message for patient.

## 2022-04-07 NOTE — Telephone Encounter (Signed)
[id number] - TREATMENT OF REFRACTORY NAUSEA  04/07/22  PHONE CALL: Confirmed I was speaking with Marius Ditch . Informed patient reason for call is to follow-up about the above mentioned study. Patient stated she spoke with Dr. Lindi Adie yesterday and has decided to forgo chemo and do Herceptin + Letrozole. This coordinator expressed understanding and thanked patient for her time and consideration of the above mentioned study.   Carol Ada, RT(R)(T) Clinical Research Coordinator

## 2022-04-07 NOTE — Progress Notes (Signed)
New Breast Cancer Diagnosis: Left Breast UOQ  Did patient present with symptoms (if so, please note symptoms) or screening mammography?: Screening mammogram detected 2 masses in the left breast.   MRI Breast 04/07/2022:   Location and Extent of disease :left breast. Located at 2 o'clock and 2:30 position, measured 1.5 cm and 1.3 cm in greatest dimension. Adenopathy yes.  Histology per Pathology Report: grade 3, Invasive Ductal Carcinoma 03/14/2022  Receptor Status: ER(positive), PR (negative), Her2-neu (positive), Ki-(95%)  Receptor Status: ER(positive), PR (negative), Her2-neu (negative), Ki-(%)   Surgeon and surgical plan, if any:  Dr. Brantley Stage 03/27/2022 -She appears to be a good neoadjuvant chemotherapy candidate. I discussed this with her today as well as port placement for administration of this.  -I discussed breast conserving surgery with her, the management of the axilla in her circumstance given a positive node, as well as mastectomy and reconstruction.  -I recommend genetic counseling as well for her given her sisters history as well as hers.  -She will think all these options over.  -Discussed breast conserving surgery as well with the goal of chemotherapy to downstage her disease.  -She will see oncology and let me know about her final decision.     Medical oncologist, treatment if any:   Dr. Lindi Adie 04/06/2022 -She presents to the clinic today for a follow-up to discuss TCHP.  She tells me that she has tremendous number of allergies and therefore she is concerned about going through chemotherapy. -In fact she informed that she does not want to do chemotherapy and would rather only do the Herceptin Perjeta along with the letrozole that she is currently taking.   -She is tolerating letrozole fairly well.  Therefore she has decided not to initiate chemotherapy but to start with the Herceptin and Perjeta along with her letrozole and see what happens.  If she does not respond then she  will go on chemo. -CT CAP and bone scan also being planned -Patient will be seeing Dr. Chryl Heck on her first day of her treatment.  She will go through her scans in detail. Patient's husband is focused on holistic approach to treatment. He wants to know all the ingredients in Herceptin and Perjeta.  I informed him that it may not be possible to find out. Plan: 1.Neoadjuvant chemotherapy (Herceptin Perjeta and Letrozole) 2. Breast conserving surgery if possible with sentinel lymph node bx 3. Adjuvant radiation therapy if patient has lumpectomy 4. Adjuvant antiestrogen therapy with neratinib. -Chemo start 04/21/2022  Family History of Breast/Ovarian/Prostate Cancer: Sister had breast cancer in her 72's.  Dad had prostate cancer.  Maternal grandfather had prostate.  Prostate cancer in the family.    Lymphedema issues, if any: Yes     Pain issues, if any: Has some tenderness at the biopsy site.    SAFETY ISSUES: Prior radiation? No Pacemaker/ICD? No Possible current pregnancy? Perimenopausal Is the patient on methotrexate? No  Current Complaints / other details:   Port Insertion 04/20/2022

## 2022-04-11 ENCOUNTER — Ambulatory Visit
Admission: RE | Admit: 2022-04-11 | Discharge: 2022-04-11 | Disposition: A | Discharge status: Home / Self Care | Payer: BC Managed Care – PPO | Source: Ambulatory Visit | Attending: Radiation Oncology | Admitting: Radiation Oncology

## 2022-04-11 ENCOUNTER — Encounter: Payer: Self-pay | Admitting: Radiation Oncology

## 2022-04-11 ENCOUNTER — Inpatient Hospital Stay: Payer: BC Managed Care – PPO | Admitting: Licensed Clinical Social Worker

## 2022-04-11 ENCOUNTER — Other Ambulatory Visit: Payer: Self-pay

## 2022-04-11 VITALS — BP 136/73 | HR 62 | Temp 97.7°F | Resp 16 | Ht 63.0 in | Wt 124.2 lb

## 2022-04-11 DIAGNOSIS — C50412 Malignant neoplasm of upper-outer quadrant of left female breast: Secondary | ICD-10-CM | POA: Diagnosis not present

## 2022-04-11 DIAGNOSIS — Z800 Family history of malignant neoplasm of digestive organs: Secondary | ICD-10-CM | POA: Diagnosis not present

## 2022-04-11 DIAGNOSIS — Z170 Estrogen receptor positive status [ER+]: Secondary | ICD-10-CM | POA: Diagnosis not present

## 2022-04-11 DIAGNOSIS — Z8042 Family history of malignant neoplasm of prostate: Secondary | ICD-10-CM | POA: Insufficient documentation

## 2022-04-11 DIAGNOSIS — Z79811 Long term (current) use of aromatase inhibitors: Secondary | ICD-10-CM | POA: Diagnosis not present

## 2022-04-11 DIAGNOSIS — Z87891 Personal history of nicotine dependence: Secondary | ICD-10-CM | POA: Insufficient documentation

## 2022-04-11 DIAGNOSIS — Z803 Family history of malignant neoplasm of breast: Secondary | ICD-10-CM | POA: Diagnosis not present

## 2022-04-11 NOTE — Progress Notes (Signed)
Radiation Oncology         [phone]) [phone] ________________________________  Name: Caitlyn Torres        MRN: [id number]  Date of Service: 04/11/2022 DOB: November 30, 1960  [id number], Marilynne Drivers, MD  Nicholas Lose, MD     REFERRING PHYSICIAN: Nicholas Lose, MD   DIAGNOSIS: The encounter diagnosis was Malignant neoplasm of upper-outer quadrant of left breast in female, estrogen receptor positive ([place]).   HISTORY OF PRESENT ILLNESS: Caitlyn Torres is a 61 y.o. female seen at the request of Dr. Lindi Adie for a diagnosis of left breast cancer. The patient was seen for screening mammogram earlier last month that showed a mass in the left breast.  Further diagnostic work-up showed 2 adjacent oval masses with irregular margins in the 2 and 230 o'clock positions each measured 1.5 cm and 1.3 cm respectively.  The largest of the 2 masses corresponded to the mammographic finding and may represent an abnormal intramammary lymph node.  The left axilla showed a single morphologically abnormal lymph node with cortical thickening of 6 mm.  The remaining visualized axillary nodes were negative.  She underwent a biopsy on 03/14/2022 of the breast and lymph node.  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  Her tumor in the breast was ER positive weak to moderate staining, PR negative, HER2 positive with a Ki-67 of 95%.  Her node was ER positive weak to moderate staining PR negative, HER2 negative.  She underwent an MRI on 04/07/2022 of the breasts, this confirmed the 2 biopsy-proven malignancies with no additional suspicious findings in the left breast, and normal-appearing right breast.  No additional lymphadenopathy was identified but biopsy-proven confirmation in the lymph node measuring 2 cm with clip artifact present.  She is scheduled to undergo staging studies later this week.  She met with Dr. Quintella Reichert who recommends  neoadjuvant chemotherapy.  She is currently tolerating letrozole and elected to forego chemotherapy but to start with Herceptin and Perjeta along with letrozole.  Plans are to continue with this for 3 cycles then reassess with repeat MRI, if this persists then chemotherapy would be added.  She desires breast conservation if possible, and is seen to discuss adjuvant radiotherapy.  PREVIOUS RADIATION THERAPY: No   PAST MEDICAL HISTORY: History reviewed. No pertinent past medical history.     PAST SURGICAL HISTORY:History reviewed. No pertinent surgical history.   FAMILY HISTORY:  Family History  Problem Relation Age of Onset   Prostate cancer Father    Breast cancer Sister 4   Pancreatic cancer Paternal Grandmother    Parkinson's disease Paternal Grandmother    Bladder Cancer Paternal Grandfather      SOCIAL HISTORY:  reports that she has quit smoking. Her smoking use included cigarettes. She has never used smokeless tobacco. She reports that she does not currently use alcohol. The patient is in a relationship and lives in [place], but is moving to [place] this weekend. 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     ALLERGIES: Aleve [naproxen sodium], Aspirin, Macrobid [nitrofurantoin], Motrin [ibuprofen], and Tylenol [acetaminophen]   MEDICATIONS:  Current Outpatient Medications  Medication Sig Dispense Refill   carboxymethylcellulose (REFRESH PLUS) 0.5 % SOLN 1 drop 3 (three) times daily as needed (irritation).     cetirizine (ZYRTEC) 10 MG tablet Take 10 mg by mouth daily.  EPINEPHrine 0.3 mg/0.3 mL IJ SOAJ injection Inject 0.3 mg into the muscle as needed for anaphylaxis.     letrozole (FEMARA) 2.5 MG tablet Take 1 tablet (2.5 mg total) by mouth daily. 90 tablet 3   olopatadine (PATANOL) 0.1 % ophthalmic solution Place 1 drop into both eyes daily as needed for allergies.     dexamethasone (DECADRON) 4  MG tablet Take 1 tablet (4 mg total) by mouth daily. Take 1 tablet day before chemo and 1 tablet day after chemo with food (Patient not taking: Reported on 04/11/2022) 12 tablet 0   lidocaine-prilocaine (EMLA) cream Apply to affected area once (Patient not taking: Reported on 04/11/2022) 30 g 3   ondansetron (ZOFRAN) 8 MG tablet Take 1 tablet (8 mg total) by mouth 2 (two) times daily as needed (Nausea or vomiting). Start on the third day after chemotherapy. (Patient not taking: Reported on 04/11/2022) 30 tablet 1   prochlorperazine (COMPAZINE) 10 MG tablet Take 1 tablet (10 mg total) by mouth every 6 (six) hours as needed (Nausea or vomiting). (Patient not taking: Reported on 04/11/2022) 30 tablet 1   No current facility-administered medications for this encounter.     REVIEW OF SYSTEMS: On review of systems, the patient reports that she is doing pretty well with navigating her illness and is glad to be relocating to [place] as she used to live here and her daughter lives here as well. No breast specific complaints are noted.      PHYSICAL EXAM:  Wt Readings from Last 3 Encounters:  04/11/22 124 lb 4 oz (56.4 kg)  04/06/22 122 lb 4.8 oz (55.5 kg)  03/29/22 122 lb 6.4 oz (55.5 kg)   Temp Readings from Last 3 Encounters:  04/11/22 97.7 F (36.5 C) (Oral)  04/06/22 97.7 F (36.5 C) (Temporal)  03/29/22 97.7 F (36.5 C) (Temporal)   BP Readings from Last 3 Encounters:  04/11/22 136/73  04/06/22 139/80  03/29/22 (!) 148/78   Pulse Readings from Last 3 Encounters:  04/11/22 62  04/06/22 63  03/29/22 75    In general this is a well appearing caucasian female in no acute distress. She's alert and oriented x4 and appropriate throughout the examination. Cardiopulmonary assessment is negative for acute distress and she exhibits normal effort. Bilateral breast exam is deferred.    ECOG = 0  0 - Asymptomatic (Fully active, able to carry on all predisease activities without  restriction)  1 - Symptomatic but completely ambulatory (Restricted in physically strenuous activity but ambulatory and able to carry out work of a light or sedentary nature. For example, light housework, office work)  2 - Symptomatic, <50% in bed during the day (Ambulatory and capable of all self care but unable to carry out any work activities. Up and about more than 50% of waking hours)  3 - Symptomatic, >50% in bed, but not bedbound (Capable of only limited self-care, confined to bed or chair 50% or more of waking hours)  4 - Bedbound (Completely disabled. Cannot carry on any self-care. Totally confined to bed or chair)  5 - Death   Eustace Pen MM, Creech RH, Tormey DC, et al. [phone]). "Toxicity and response criteria of the [facility]". [place] Oncol. 5 (6): 649-55    LABORATORY DATA:  Lab Results  Component Value Date   WBC 4.7 03/07/2011   HGB 13.3 03/07/2011   HCT 40.0 03/07/2011   MCV 87.0 03/07/2011   PLT 221 03/07/2011   Lab Results  Component Value Date   NA 137 03/07/2011   K 3.7 03/07/2011   CL 106 03/07/2011   CO2 22 03/07/2011   No results found for: ALT, AST, GGT, ALKPHOS, BILITOT    RADIOGRAPHY: MR BREAST BILATERAL W WO CONTRAST INC CAD  Result Date: 04/07/2022 CLINICAL DATA:  Recent diagnosis of invasive ductal carcinoma involving 2 adjacent masses in the posterior upper outer left breast as well as metastatic left axillary lymph node. Assess extent of disease. EXAM: BILATERAL BREAST MRI WITH AND WITHOUT CONTRAST TECHNIQUE: Multiplanar, multisequence MR images of both breasts were obtained prior to and following the intravenous administration of 5 ml of Gadavist. Three-dimensional MR images were rendered by post-processing of the original MR data on an independent workstation. The three-dimensional MR images were interpreted, and findings are reported in the following complete MRI report for this study. Three dimensional images were  evaluated at the independent interpreting workstation using the DynaCAD thin client. COMPARISON:  Previous exams. FINDINGS: Breast composition: c. Heterogeneous fibroglandular tissue. Background parenchymal enhancement: Mild Right breast: No mass or abnormal enhancement. Left breast: Evidence of patient's biopsy-proven malignancy over the posterior upper outer left breast measuring 1.3 x 2.1 x 1.4 cm (image 75, series 9) with associated clip artifact. Evidence of patient's biopsy-proven smaller malignancy over the posterior upper outer left breast measuring 0.6 x 1.2 x 0.9 cm (image 85, series 9) with associated clip artifact. Remainder of the left breast is normal. Lymph nodes: Biopsy-proven metastatic left axillary lymph node measuring 2 cm in diameter and containing clip artifact along its superior margin. Ancillary findings:  None. IMPRESSION: 1. Evidence of patient's 2 adjacent biopsy-proven malignancies over the posterior left upper outer quadrant with associated clip artifact. No additional suspicious findings within the left breast. 2.  Normal MRI of the right breast. 3. Biopsy-proven metastatic left axillary lymph node with associated clip artifact. RECOMMENDATION: Recommend continued management per clinical treatment plan. BI-RADS CATEGORY  6: Known biopsy-proven malignancy. Electronically Signed   By: Marin Olp M.D.   On: 04/07/2022 12:25  ECHOCARDIOGRAM COMPLETE  Result Date: 04/06/2022    ECHOCARDIOGRAM REPORT   Patient Name:   CHEYEANNE ROADCAP Date of Exam: 04/06/2022 Medical Rec #:  [id number]           Height: Accession #:    [id number]          Weight:       122.4 lb Date of Birth:  03/28/1961           BSA:          1.456 m Patient Age:    70 years            BP:           127/85 mmHg Patient Gender: F                   HR:           58 bpm. Exam Location:  Outpatient Procedure: 2D Echo, Cardiac Doppler, Color Doppler and Strain Analysis Indications:    Chemo  History:        Patient has  no prior history of Echocardiogram examinations. LT                 Breast Cancer.  Sonographer:    Joette Catching RCS Referring Phys: [phone] Nicholas Lose  Sonographer Comments: Global longitudinal strain was attempted. IMPRESSIONS  1. Left ventricular ejection fraction, by estimation, is 55 to 60%. The left ventricle  has normal function. The left ventricle has no regional wall motion abnormalities. Left ventricular diastolic parameters were normal. The average left ventricular global longitudinal strain is -16.4 %. The global longitudinal strain is normal. GLS likely underestimated due to suboptimal endocardial tracking.  2. Right ventricular systolic function is normal. The right ventricular size is normal. There is normal pulmonary artery systolic pressure.  3. The mitral valve is normal in structure. No evidence of mitral valve regurgitation. No evidence of mitral stenosis.  4. The aortic valve is normal in structure. Aortic valve regurgitation is not visualized. No aortic stenosis is present.  5. The inferior vena cava is normal in size with greater than 50% respiratory variability, suggesting right atrial pressure of 3 mmHg. FINDINGS  Left Ventricle: Left ventricular ejection fraction, by estimation, is 55 to 60%. The left ventricle has normal function. The left ventricle has no regional wall motion abnormalities. The average left ventricular global longitudinal strain is -16.4 %. The global longitudinal strain is normal. The left ventricular internal cavity size was normal in size. There is no left ventricular hypertrophy. Left ventricular diastolic parameters were normal. Right Ventricle: The right ventricular size is normal. No increase in right ventricular wall thickness. Right ventricular systolic function is normal. There is normal pulmonary artery systolic pressure. The tricuspid regurgitant velocity is 1.94 m/s, and  with an assumed right atrial pressure of 3 mmHg, the estimated right ventricular  systolic pressure is 75.4 mmHg. Left Atrium: Left atrial size was normal in size. Right Atrium: Right atrial size was normal in size. Pericardium: There is no evidence of pericardial effusion. Mitral Valve: The mitral valve is normal in structure. No evidence of mitral valve regurgitation. No evidence of mitral valve stenosis. Tricuspid Valve: The tricuspid valve is normal in structure. Tricuspid valve regurgitation is not demonstrated. No evidence of tricuspid stenosis. Aortic Valve: The aortic valve is normal in structure. Aortic valve regurgitation is not visualized. No aortic stenosis is present. Aortic valve mean gradient measures 2.0 mmHg. Aortic valve peak gradient measures 3.9 mmHg. Aortic valve area, by VTI measures 2.21 cm. Pulmonic Valve: The pulmonic valve was normal in structure. Pulmonic valve regurgitation is trivial. No evidence of pulmonic stenosis. Aorta: The aortic root is normal in size and structure. Venous: The inferior vena cava is normal in size with greater than 50% respiratory variability, suggesting right atrial pressure of 3 mmHg. IAS/Shunts: No atrial level shunt detected by color flow Doppler.  LEFT VENTRICLE PLAX 2D LVIDd:         4.10 cm     Diastology LVIDs:         2.70 cm     LV e' medial:    6.53 cm/s LV PW:         0.80 cm     LV E/e' medial:  11.2 LV IVS:        0.80 cm     LV e' lateral:   8.27 cm/s LVOT diam:     1.90 cm     LV E/e' lateral: 8.9 LV SV:         48 LV SV Index:   33          2D Longitudinal Strain LVOT Area:     2.84 cm    2D Strain GLS Avg:     -16.4 %  LV Volumes (MOD) LV vol d, MOD A2C: 70.2 ml LV vol d, MOD A4C: 60.6 ml LV vol s, MOD A2C: 20.9 ml LV vol s,  MOD A4C: 28.2 ml LV SV MOD A2C:     49.3 ml LV SV MOD A4C:     60.6 ml LV SV MOD BP:      42.6 ml RIGHT VENTRICLE             IVC RV Basal diam:  2.30 cm     IVC diam: 1.60 cm RV Mid diam:    1.90 cm RV S prime:     11.60 cm/s TAPSE (M-mode): 1.8 cm LEFT ATRIUM           Index        RIGHT ATRIUM            Index LA diam:      2.90 cm 1.99 cm/m   RA Area:     14.20 cm LA Vol (A2C): 35.3 ml 24.25 ml/m  RA Volume:   33.20 ml  22.81 ml/m LA Vol (A4C): 20.9 ml 14.36 ml/m  AORTIC VALVE                    PULMONIC VALVE AV Area (Vmax):    2.11 cm     PV Vmax:          0.69 m/s AV Area (Vmean):   2.09 cm     PV Peak grad:     1.9 mmHg AV Area (VTI):     2.21 cm     PR End Diast Vel: 3.02 msec AV Vmax:           99.00 cm/s AV Vmean:          66.900 cm/s AV VTI:            0.217 m AV Peak Grad:      3.9 mmHg AV Mean Grad:      2.0 mmHg LVOT Vmax:         73.70 cm/s LVOT Vmean:        49.300 cm/s LVOT VTI:          0.169 m LVOT/AV VTI ratio: 0.78  AORTA Ao Root diam: 2.80 cm Ao Asc diam:  2.60 cm MITRAL VALVE               TRICUSPID VALVE MV Area (PHT): 3.50 cm    TR Peak grad:   15.1 mmHg MV Decel Time: 217 msec    TR Vmax:        194.00 cm/s MV E velocity: 73.30 cm/s MV A velocity: 69.00 cm/s  SHUNTS MV E/A ratio:  1.06        Systemic VTI:  0.17 m                            Systemic Diam: 1.90 cm Glori Bickers MD Electronically signed by Glori Bickers MD Signature Date/Time: 04/06/2022/10:49:21 AM    Final    [place] AXILLARY NODE CORE BIOPSY LEFT  Addendum Date: 03/22/2022   ADDENDUM REPORT: 03/22/2022 15:18 ADDENDUM: Pathology revealed GRADE III INVASIVE DUCTAL CARCINOMA of the LEFT breast, 2:00 o'clock, (coil clip). This was found to be concordant by Dr. Lovey Newcomer. Pathology revealed GRADE III INVASIVE DUCTAL CARCINOMA of the LEFT breast, 2:30 o'clock, (ribbon clip). This was found to be concordant by Dr. Lovey Newcomer. Pathology revealed METASTATIC CARCINOMA INVOLVING NODAL TISSUE of the LEFT axilla, (hydromark clip). This was found to be concordant by Dr. Lovey Newcomer. Pathology results were discussed with the patient by telephone. The patient  reported doing well after the biopsies with tenderness at the sites. Post biopsy instructions and care were reviewed and questions were answered. The patient was  encouraged to call [facility] for any additional concerns. My direct phone number was provided. Surgical consultation has been arranged with Dr. Erroll Luna, per patient request, at [facility] on Mar 27, 2022. Medical oncology consultation has been arranged with Dr. Nicholas Lose at [facility] on Mar 29, 2022. Given the pathologic Diagnosis as well as dense fibroglandular tissue, breast MRI is recommended. Pathology results reported by Terie Purser, RN on 03/15/2022. Electronically Signed   By: Lovey Newcomer M.D.   On: 03/22/2022 15:18   Result Date: 03/22/2022 CLINICAL DATA:  Patient with indeterminate left breast masses and cortically thickened left axillary lymph node. EXAM: ULTRASOUND GUIDED LEFT BREAST CORE NEEDLE BIOPSY COMPARISON:  [facility] PROCEDURE: I met with the patient and we discussed the procedure of ultrasound-guided biopsy, including benefits and alternatives. We discussed the high likelihood of a successful procedure. We discussed the risks of the procedure, including infection, bleeding, tissue injury, clip migration, and inadequate sampling. Informed written consent was given. The usual time-out protocol was performed immediately prior to the procedure. Site 1: Left breast 2 o'clock position 8 cm from the nipple Lesion quadrant: Upper outer quadrant Using sterile technique and 1% Lidocaine as local anesthetic, under direct ultrasound visualization, a 14 gauge spring-loaded device was used to perform biopsy of left breast mass 2 o'clock position using a lateral approach. At the conclusion of the procedure coil tissue marker clip was deployed into the biopsy cavity. Follow up 2 view mammogram was performed and dictated separately. Site 2: Left breast 2:30 o'clock position 8 cm from nipple Lesion quadrant: Upper outer quadrant Using sterile technique and 1% Lidocaine as local anesthetic, under direct ultrasound visualization, a 14 gauge  spring-loaded device was used to perform biopsy of left breast mass 2:30 o'clock using a lateral approach. At the conclusion of the procedure ribbon tissue marker clip was deployed into the biopsy cavity. Follow up 2 view mammogram was performed and dictated separately. Site 3: Left axillary lymph node Lesion quadrant: Upper outer quadrant Using sterile technique and 1% Lidocaine as local anesthetic, under direct ultrasound visualization, a 14 gauge spring-loaded device was used to perform biopsy of left axillary lymph node using a lateral approach. At the conclusion of the procedure [facility] tissue marker clip was deployed into the biopsy cavity. Follow up 2 view mammogram was performed and dictated separately. IMPRESSION: Ultrasound guided biopsy of two adjacent left breast masses and cortically thickened left axillary lymph node. No apparent complications. Electronically Signed: By: Lovey Newcomer M.D. On: 03/14/2022 13:58  MM CLIP PLACEMENT LEFT  Result Date: 03/14/2022 CLINICAL DATA:  Patient status post ultrasound biopsy 2 left breast masses and 1 left axillary lymph node. EXAM: 3D DIAGNOSTIC LEFT MAMMOGRAM POST ULTRASOUND BIOPSY COMPARISON:  None Available. FINDINGS: 3D Mammographic images were obtained following ultrasound guided biopsy of 2 left breast masses and 1 axillary lymph node. Site 1: Left breast mass 2 o'clock position: Coil shaped clip: In appropriate position. Site 2: Left breast mass 2:30 o'clock position: Ribbon shaped clip: In appropriate position. Site 3: Left axillary lymph node: HydroMARK clip: In appropriate position. IMPRESSION: Appropriate positioning of the biopsy marking clips as above. Final Assessment: Post Procedure Mammograms for Marker Placement Electronically Signed   By: Lovey Newcomer M.D.   On: 03/14/2022 14:12  [place] LT BREAST BX  W LOC DEV 1ST LESION IMG BX SPEC US GUIDE  Addendum Date: 03/22/2022   ADDENDUM REPORT: 03/22/2022 15:18 ADDENDUM: Pathology revealed GRADE III  INVASIVE DUCTAL CARCINOMA of the LEFT breast, 2:00 o'clock, (coil clip). This was found to be concordant by Dr. Lovey Newcomer. Pathology revealed GRADE III INVASIVE DUCTAL CARCINOMA of the LEFT breast, 2:30 o'clock, (ribbon clip). This was found to be concordant by Dr. Lovey Newcomer. Pathology revealed METASTATIC CARCINOMA INVOLVING NODAL TISSUE of the LEFT axilla, (hydromark clip). This was found to be concordant by Dr. Lovey Newcomer. Pathology results were discussed with the patient by telephone. The patient reported doing well after the biopsies with tenderness at the sites. Post biopsy instructions and care were reviewed and questions were answered. The patient was encouraged to call [facility] for any additional concerns. My direct phone number was provided. Surgical consultation has been arranged with Dr. Erroll Luna, per patient request, at [facility] on Mar 27, 2022. Medical oncology consultation has been arranged with Dr. Nicholas Lose at [facility] on Mar 29, 2022. Given the pathologic Diagnosis as well as dense fibroglandular tissue, breast MRI is recommended. Pathology results reported by Terie Purser, RN on 03/15/2022. Electronically Signed   By: Lovey Newcomer M.D.   On: 03/22/2022 15:18   Result Date: 03/22/2022 CLINICAL DATA:  Patient with indeterminate left breast masses and cortically thickened left axillary lymph node. EXAM: ULTRASOUND GUIDED LEFT BREAST CORE NEEDLE BIOPSY COMPARISON:  [facility] PROCEDURE: I met with the patient and we discussed the procedure of ultrasound-guided biopsy, including benefits and alternatives. We discussed the high likelihood of a successful procedure. We discussed the risks of the procedure, including infection, bleeding, tissue injury, clip migration, and inadequate sampling. Informed written consent was given. The usual time-out protocol was performed immediately prior to the procedure. Site 1: Left breast 2 o'clock  position 8 cm from the nipple Lesion quadrant: Upper outer quadrant Using sterile technique and 1% Lidocaine as local anesthetic, under direct ultrasound visualization, a 14 gauge spring-loaded device was used to perform biopsy of left breast mass 2 o'clock position using a lateral approach. At the conclusion of the procedure coil tissue marker clip was deployed into the biopsy cavity. Follow up 2 view mammogram was performed and dictated separately. Site 2: Left breast 2:30 o'clock position 8 cm from nipple Lesion quadrant: Upper outer quadrant Using sterile technique and 1% Lidocaine as local anesthetic, under direct ultrasound visualization, a 14 gauge spring-loaded device was used to perform biopsy of left breast mass 2:30 o'clock using a lateral approach. At the conclusion of the procedure ribbon tissue marker clip was deployed into the biopsy cavity. Follow up 2 view mammogram was performed and dictated separately. Site 3: Left axillary lymph node Lesion quadrant: Upper outer quadrant Using sterile technique and 1% Lidocaine as local anesthetic, under direct ultrasound visualization, a 14 gauge spring-loaded device was used to perform biopsy of left axillary lymph node using a lateral approach. At the conclusion of the procedure [facility] tissue marker clip was deployed into the biopsy cavity. Follow up 2 view mammogram was performed and dictated separately. IMPRESSION: Ultrasound guided biopsy of two adjacent left breast masses and cortically thickened left axillary lymph node. No apparent complications. Electronically Signed: By: Lovey Newcomer M.D. On: 03/14/2022 13:58  [place] LT BREAST BX W LOC DEV EA ADD LESION IMG BX SPEC US GUIDE  Addendum Date: 03/22/2022   ADDENDUM REPORT: 03/22/2022 15:18 ADDENDUM: Pathology revealed GRADE  III INVASIVE DUCTAL CARCINOMA of the LEFT breast, 2:00 o'clock, (coil clip). This was found to be concordant by Dr. Lovey Newcomer. Pathology revealed GRADE III INVASIVE DUCTAL CARCINOMA  of the LEFT breast, 2:30 o'clock, (ribbon clip). This was found to be concordant by Dr. Lovey Newcomer. Pathology revealed METASTATIC CARCINOMA INVOLVING NODAL TISSUE of the LEFT axilla, (hydromark clip). This was found to be concordant by Dr. Lovey Newcomer. Pathology results were discussed with the patient by telephone. The patient reported doing well after the biopsies with tenderness at the sites. Post biopsy instructions and care were reviewed and questions were answered. The patient was encouraged to call [facility] for any additional concerns. My direct phone number was provided. Surgical consultation has been arranged with Dr. Erroll Luna, per patient request, at [facility] on Mar 27, 2022. Medical oncology consultation has been arranged with Dr. Nicholas Lose at [facility] on Mar 29, 2022. Given the pathologic Diagnosis as well as dense fibroglandular tissue, breast MRI is recommended. Pathology results reported by Terie Purser, RN on 03/15/2022. Electronically Signed   By: Lovey Newcomer M.D.   On: 03/22/2022 15:18   Result Date: 03/22/2022 CLINICAL DATA:  Patient with indeterminate left breast masses and cortically thickened left axillary lymph node. EXAM: ULTRASOUND GUIDED LEFT BREAST CORE NEEDLE BIOPSY COMPARISON:  [facility] PROCEDURE: I met with the patient and we discussed the procedure of ultrasound-guided biopsy, including benefits and alternatives. We discussed the high likelihood of a successful procedure. We discussed the risks of the procedure, including infection, bleeding, tissue injury, clip migration, and inadequate sampling. Informed written consent was given. The usual time-out protocol was performed immediately prior to the procedure. Site 1: Left breast 2 o'clock position 8 cm from the nipple Lesion quadrant: Upper outer quadrant Using sterile technique and 1% Lidocaine as local anesthetic, under direct ultrasound visualization, a 14 gauge  spring-loaded device was used to perform biopsy of left breast mass 2 o'clock position using a lateral approach. At the conclusion of the procedure coil tissue marker clip was deployed into the biopsy cavity. Follow up 2 view mammogram was performed and dictated separately. Site 2: Left breast 2:30 o'clock position 8 cm from nipple Lesion quadrant: Upper outer quadrant Using sterile technique and 1% Lidocaine as local anesthetic, under direct ultrasound visualization, a 14 gauge spring-loaded device was used to perform biopsy of left breast mass 2:30 o'clock using a lateral approach. At the conclusion of the procedure ribbon tissue marker clip was deployed into the biopsy cavity. Follow up 2 view mammogram was performed and dictated separately. Site 3: Left axillary lymph node Lesion quadrant: Upper outer quadrant Using sterile technique and 1% Lidocaine as local anesthetic, under direct ultrasound visualization, a 14 gauge spring-loaded device was used to perform biopsy of left axillary lymph node using a lateral approach. At the conclusion of the procedure [facility] tissue marker clip was deployed into the biopsy cavity. Follow up 2 view mammogram was performed and dictated separately. IMPRESSION: Ultrasound guided biopsy of two adjacent left breast masses and cortically thickened left axillary lymph node. No apparent complications. Electronically Signed: By: Lovey Newcomer M.D. On: 03/14/2022 13:58      IMPRESSION/PLAN: 1. 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 Dr. Lisbeth Renshaw discusses the pathology findings and reviews the nature of left breast disease. The consensus from her providers are to proceed with neoadjuvant therapy. She is not interested in chemotherapy,  so she will continue Letrozole and begin Herceptin. The plan is to complete 3 cycles of this and restage her with MRI. If her disease persists at the size now, chemotherapy would be revisited.  She is hoping to proceed ultimately with  breast conservation with lumpectomy with targeted node dissection. Dr. Lisbeth Renshaw discusses the rationale for external radiotherapy to the breast and regional nodes to reduce risks of local recurrence followed by continuation of antiestrogen therapy. We discussed the risks, benefits, short, and long term effects of radiotherapy, as well as the curative intent, and the patient is interested in proceeding at the appropriate time. Dr. Lisbeth Renshaw discusses the delivery and logistics of radiotherapy and anticipates a course of 6 1/2 weeks of radiotherapy to the left breast and regional nodes with deep inspiration breath hold technique. We will see her back a few weeks after surgery to discuss the simulation process and anticipate we starting radiotherapy about 4-6 weeks after surgery. '    In a visit lasting 60 minutes, greater than 50% of the time was spent face to face reviewing her case, as well as in preparation of, discussing, and coordinating the patient's care.  The above documentation reflects my direct findings during this shared patient visit. Please see the separate note by Dr. Lisbeth Renshaw on this date for the remainder of the patient's plan of care.    Carola Rhine, [facility]    **Disclaimer: This note was dictated with voice recognition software. Similar sounding words can inadvertently be transcribed and this note may contain transcription errors which may not have been corrected upon publication of note.**

## 2022-04-11 NOTE — Progress Notes (Signed)
[place] CSW Progress Note  Holiday representative met with patient to provide coping support. Patient experiencing a multitude of stressors with moving this coming weekend, all of her initial appointments and scans, and changing lifestyle habits (eating more plant-based). Pt feels she has gotten out of her habit of doing devotionals/reading Bible and walking which are typically helpful for her. CSW and pt discussed ways to reincorporate these into her life. Also discussed giving herself grace if she is not able to do something or eat strictly plant-based one day.    Pt also expressed interest in some support programs- signed up for yoga and may do Reiki in the future.  CSW will plan to see pt during first infusion on 04/21/22.    Marlean Mortell E Kristyn Obyrne, LCSW

## 2022-04-12 ENCOUNTER — Encounter: Payer: Self-pay | Admitting: *Deleted

## 2022-04-12 NOTE — Pre-Procedure Instructions (Signed)
Surgical Instructions    Your procedure is scheduled on Thursday, April 20, 2022 at 7:30 AM.  Report to [facility] Main Entrance "A" at 5:30 A.M., then check in with the Admitting office.  Call this number if you have problems the morning of surgery:  [phone]   If you have any questions prior to your surgery date call [phone]: Open Monday-Friday 8am-4pm    Remember:  Do not eat after midnight the night before your surgery  You may drink clear liquids until 4:30 AM the morning of your surgery.   Clear liquids allowed are: Water, Non-Citrus Juices (without pulp), Carbonated Beverages, Clear Tea, Black Coffee Only (NO MILK, CREAM OR POWDERED CREAMER of any kind), and Gatorade.    Take these medicines the morning of surgery with A SIP OF WATER:  cetirizine (ZYRTEC)  IF NEEDED: carboxymethylcellulose (REFRESH PLUS) EPINEPHrine olopatadine (PATANOL)   As of today, STOP taking any Aspirin (unless otherwise instructed by your surgeon) Aleve, Naproxen, Ibuprofen, Motrin, Advil, Goody's, BC's, all herbal medications, fish oil, and all vitamins.                     Do NOT Smoke (Tobacco/Vaping) for 24 hours prior to your procedure.  If you use a CPAP at night, you may bring your mask/headgear for your overnight stay.   Contacts, glasses, piercing's, hearing aid's, dentures or partials may not be worn into surgery, please bring cases for these belongings.    For patients admitted to the hospital, discharge time will be determined by your treatment team.   Patients discharged the day of surgery will not be allowed to drive home, and someone needs to stay with them for 24 hours.  SURGICAL WAITING ROOM VISITATION Patients having surgery or a procedure may have two support people in the waiting area. Visitors may stay in the waiting area during the procedure and switch out with other visitors if needed. Children under the age of 42 must have an adult accompany them who is not the  patient. If the patient needs to stay at the hospital during part of their recovery, the visitor guidelines for inpatient rooms apply.  Please refer to the [facility] website for the visitor guidelines for Inpatients (after your surgery is over and you are in a regular room).    Special instructions:   [place]- Preparing For Surgery  Before surgery, you can play an important role. Because skin is not sterile, your skin needs to be as free of germs as possible. You can reduce the number of germs on your skin by washing with CHG (chlorahexidine gluconate) Soap before surgery.  CHG is an antiseptic cleaner which kills germs and bonds with the skin to continue killing germs even after washing.    Oral Hygiene is also important to reduce your risk of infection.  Remember - BRUSH YOUR TEETH THE MORNING OF SURGERY WITH YOUR REGULAR TOOTHPASTE  Please do not use if you have an allergy to CHG or antibacterial soaps. If your skin becomes reddened/irritated stop using the CHG.  Do not shave (including legs and underarms) for at least 48 hours prior to first CHG shower. It is OK to shave your face.  Please follow these instructions carefully.   Shower the NIGHT BEFORE SURGERY and the MORNING OF SURGERY  If you chose to wash your hair, wash your hair first as usual with your normal shampoo.  After you shampoo, rinse your hair and body thoroughly to remove the shampoo.  Use CHG Soap as you would any other liquid soap. You can apply CHG directly to the skin and wash gently with a scrungie or a clean washcloth.   Apply the CHG Soap to your body ONLY FROM THE NECK DOWN.  Do not use on open wounds or open sores. Avoid contact with your eyes, ears, mouth and genitals (private parts). Wash Face and genitals (private parts)  with your normal soap.   Wash thoroughly, paying special attention to the area where your surgery will be performed.  Thoroughly rinse your body with warm water from the neck  down.  DO NOT shower/wash with your normal soap after using and rinsing off the CHG Soap.  Pat yourself dry with a CLEAN TOWEL.  Wear CLEAN PAJAMAS to bed the night before surgery  Place CLEAN SHEETS on your bed the night before your surgery  DO NOT SLEEP WITH PETS.   Day of Surgery: Take a shower with CHG soap. Do not wear jewelry or makeup Do not wear lotions, powders, perfumes, or deodorant. Do not shave 48 hours prior to surgery. Do not bring valuables to the hospital.  [facility] is not responsible for any belongings or valuables. Do not wear nail polish, gel polish, artificial nails, or any other type of covering on natural nails (fingers and toes) If you have artificial nails or gel coating that need to be removed by a nail salon, please have this removed prior to surgery. Artificial nails or gel coating may interfere with anesthesia's ability to adequately monitor your vital signs. Wear Clean/Comfortable clothing the morning of surgery Do not apply any deodorants/lotions.   Remember to brush your teeth WITH YOUR REGULAR TOOTHPASTE.   Please read over the following fact sheets that you were given.  If you received a COVID test during your pre-op visit  it is requested that you wear a mask when out in public, stay away from anyone that may not be feeling well and notify your surgeon if you develop symptoms. If you have been in contact with anyone that has tested positive in the last 10 days please notify you surgeon.

## 2022-04-13 ENCOUNTER — Encounter (HOSPITAL_COMMUNITY): Payer: Self-pay

## 2022-04-13 ENCOUNTER — Encounter (HOSPITAL_COMMUNITY)
Admission: RE | Admit: 2022-04-13 | Discharge: 2022-04-13 | Disposition: A | Discharge status: Home / Self Care | Payer: BC Managed Care – PPO | Source: Ambulatory Visit | Attending: Surgery | Admitting: Surgery

## 2022-04-13 ENCOUNTER — Other Ambulatory Visit: Payer: Self-pay

## 2022-04-13 VITALS — BP 133/66 | HR 55 | Temp 97.5°F | Resp 17 | Ht 63.0 in | Wt 120.1 lb

## 2022-04-13 DIAGNOSIS — Z01818 Encounter for other preprocedural examination: Secondary | ICD-10-CM

## 2022-04-13 DIAGNOSIS — Z01812 Encounter for preprocedural laboratory examination: Secondary | ICD-10-CM | POA: Diagnosis present

## 2022-04-13 HISTORY — DX: Malignant (primary) neoplasm, unspecified: C80.1

## 2022-04-13 LAB — CBC
HCT: 43.2 % (ref 36.0–46.0)
Hemoglobin: 14.5 g/dL (ref 12.0–15.0)
MCH: 29.4 pg (ref 26.0–34.0)
MCHC: 33.6 g/dL (ref 30.0–36.0)
MCV: 87.4 fL (ref 80.0–100.0)
Platelets: 143 10*3/uL — ABNORMAL LOW (ref 150–400)
RBC: 4.94 MIL/uL (ref 3.87–5.11)
RDW: 14.4 % (ref 11.5–15.5)
WBC: 3.3 10*3/uL — ABNORMAL LOW (ref 4.0–10.5)
nRBC: 0 % (ref 0.0–0.2)

## 2022-04-13 LAB — BASIC METABOLIC PANEL
Anion gap: 7 (ref 5–15)
BUN: 6 mg/dL (ref 6–20)
CO2: 25 mmol/L (ref 22–32)
Calcium: 9.8 mg/dL (ref 8.9–10.3)
Chloride: 105 mmol/L (ref 98–111)
Creatinine, Ser: 0.69 mg/dL (ref 0.44–1.00)
GFR, Estimated: >60 mL/min (ref >60)
Glucose, Bld: 83 mg/dL (ref 70–99)
Potassium: 4.6 mmol/L (ref 3.5–5.1)
Sodium: 137 mmol/L (ref 135–145)

## 2022-04-13 NOTE — Progress Notes (Signed)
PCP - Dr. Sidney Ace Cardiologist - Denies Oncologist: Dr. Nicholas Lose  PPM/ICD - Denies  Chest x-ray - N/A EKG - N/A Stress Test - Denies ECHO - 04/06/22 Cardiac Cath - Denies  Sleep Study - Denies  Denies diabetes  Blood Thinner Instructions: N/A Aspirin Instructions: N/A  ERAS Protcol - Yes PRE-SURGERY Ensure or G2- No  COVID TEST- N/A   Anesthesia review: Yes, takes femara - hormone chemo pill  Patient denies shortness of breath, fever, cough and chest pain at PAT appointment   All instructions explained to the patient, with a verbal understanding of the material. Patient agrees to go over the instructions while at home for a better understanding. Patient also instructed to self quarantine after being tested for COVID-19. The opportunity to ask questions was provided.

## 2022-04-14 ENCOUNTER — Ambulatory Visit (HOSPITAL_COMMUNITY)
Admission: RE | Admit: 2022-04-14 | Discharge: 2022-04-14 | Disposition: A | Discharge status: Home / Self Care | Payer: BC Managed Care – PPO | Source: Ambulatory Visit | Attending: Hematology and Oncology | Admitting: Hematology and Oncology

## 2022-04-14 DIAGNOSIS — C50412 Malignant neoplasm of upper-outer quadrant of left female breast: Secondary | ICD-10-CM | POA: Insufficient documentation

## 2022-04-14 DIAGNOSIS — Z170 Estrogen receptor positive status [ER+]: Secondary | ICD-10-CM | POA: Diagnosis present

## 2022-04-14 MED ORDER — SODIUM CHLORIDE (PF) 0.9 % IJ SOLN
INTRAMUSCULAR | Status: AC
  Filled 2022-04-14: qty 50

## 2022-04-14 MED ORDER — TECHNETIUM TC 99M MEDRONATE IV KIT
20.0000 | PACK | Freq: Once | INTRAVENOUS | Status: AC | PRN
  Administered 2022-04-14: 21 via INTRAVENOUS

## 2022-04-14 MED ORDER — IOHEXOL 300 MG/ML  SOLN
100.0000 mL | Freq: Once | INTRAMUSCULAR | Status: AC | PRN
  Administered 2022-04-14: 100 mL via INTRAVENOUS

## 2022-04-14 NOTE — Progress Notes (Signed)
Pharmacist Chemotherapy Monitoring - Initial Assessment    Anticipated start date: 04/21/22   The following has been reviewed per standard work regarding the patient's treatment regimen: The patient's diagnosis, treatment plan and drug doses, and organ/hematologic function Lab orders and baseline tests specific to treatment regimen  The treatment plan start date, drug sequencing, and pre-medications Prior authorization status  Patient's documented medication list, including drug-drug interaction screen and prescriptions for anti-emetics and supportive care specific to the treatment regimen The drug concentrations, fluid compatibility, administration routes, and timing of the medications to be used The patient's access for treatment and lifetime cumulative dose history, if applicable  The patient's medication allergies and previous infusion related reactions, if applicable   Changes made to treatment plan:  pre-medications Message sent to covering MD to change premeds to benadryl only for trastuzumab and pertuzumab  Follow up needed:  N/A   Caitlyn Torres, [place], 04/14/2022  1:22 PM

## 2022-04-17 ENCOUNTER — Encounter: Payer: Self-pay | Admitting: *Deleted

## 2022-04-20 ENCOUNTER — Ambulatory Visit (HOSPITAL_COMMUNITY)
Admission: RE | Admit: 2022-04-20 | Discharge: 2022-04-20 | Disposition: A | Discharge status: Home / Self Care | Payer: BC Managed Care – PPO | Attending: Surgery | Admitting: Surgery

## 2022-04-20 ENCOUNTER — Other Ambulatory Visit: Payer: Self-pay

## 2022-04-20 ENCOUNTER — Ambulatory Visit (HOSPITAL_COMMUNITY): Payer: BC Managed Care – PPO

## 2022-04-20 ENCOUNTER — Encounter (HOSPITAL_COMMUNITY): Payer: Self-pay | Admitting: Surgery

## 2022-04-20 ENCOUNTER — Encounter (HOSPITAL_COMMUNITY)
Admission: RE | Disposition: A | Discharge status: Home / Self Care | Payer: Self-pay | Source: Home / Self Care | Attending: Surgery

## 2022-04-20 ENCOUNTER — Encounter: Payer: Self-pay | Admitting: Hematology and Oncology

## 2022-04-20 ENCOUNTER — Ambulatory Visit (HOSPITAL_COMMUNITY): Payer: BC Managed Care – PPO | Admitting: Certified Registered Nurse Anesthetist

## 2022-04-20 ENCOUNTER — Ambulatory Visit (HOSPITAL_COMMUNITY): Payer: BC Managed Care – PPO | Admitting: Emergency Medicine

## 2022-04-20 DIAGNOSIS — Z87891 Personal history of nicotine dependence: Secondary | ICD-10-CM | POA: Insufficient documentation

## 2022-04-20 DIAGNOSIS — Z171 Estrogen receptor negative status [ER-]: Secondary | ICD-10-CM | POA: Insufficient documentation

## 2022-04-20 DIAGNOSIS — C50412 Malignant neoplasm of upper-outer quadrant of left female breast: Secondary | ICD-10-CM | POA: Insufficient documentation

## 2022-04-20 DIAGNOSIS — C773 Secondary and unspecified malignant neoplasm of axilla and upper limb lymph nodes: Secondary | ICD-10-CM | POA: Insufficient documentation

## 2022-04-20 DIAGNOSIS — Z803 Family history of malignant neoplasm of breast: Secondary | ICD-10-CM | POA: Diagnosis not present

## 2022-04-20 DIAGNOSIS — Z452 Encounter for adjustment and management of vascular access device: Secondary | ICD-10-CM | POA: Insufficient documentation

## 2022-04-20 HISTORY — PX: PORTACATH PLACEMENT: SHX2246

## 2022-04-20 SURGERY — INSERTION, TUNNELED CENTRAL VENOUS DEVICE, WITH PORT
Anesthesia: General | Site: Chest | Laterality: Right

## 2022-04-20 MED ORDER — OXYCODONE HCL 5 MG PO TABS: 5.0000 mg | ORAL_TABLET | Freq: Once | ORAL | Status: DC | PRN

## 2022-04-20 MED ORDER — AMISULPRIDE (ANTIEMETIC) 5 MG/2ML IV SOLN: 10.0000 mg | Freq: Once | INTRAVENOUS | Status: DC | PRN

## 2022-04-20 MED ORDER — CEFAZOLIN SODIUM-DEXTROSE 2-4 GM/100ML-% IV SOLN
INTRAVENOUS | Status: AC
  Filled 2022-04-20: qty 100

## 2022-04-20 MED ORDER — CEFAZOLIN SODIUM-DEXTROSE 2-4 GM/100ML-% IV SOLN
2.0000 g | INTRAVENOUS | Status: AC
  Administered 2022-04-20: 2 g via INTRAVENOUS

## 2022-04-20 MED ORDER — DEXAMETHASONE SODIUM PHOSPHATE 10 MG/ML IJ SOLN
INTRAMUSCULAR | Status: DC | PRN
  Administered 2022-04-20: 8 mg via INTRAVENOUS

## 2022-04-20 MED ORDER — GABAPENTIN 300 MG PO CAPS: 300.0000 mg | ORAL_CAPSULE | ORAL | Status: AC

## 2022-04-20 MED ORDER — BUPIVACAINE-EPINEPHRINE (PF) 0.25% -1:200000 IJ SOLN
INTRAMUSCULAR | Status: AC
Start: 2022-04-20 — End: ?
  Filled 2022-04-20: qty 30

## 2022-04-20 MED ORDER — LIDOCAINE 2% (20 MG/ML) 5 ML SYRINGE
INTRAMUSCULAR | Status: DC | PRN
  Administered 2022-04-20: 40 mg via INTRAVENOUS

## 2022-04-20 MED ORDER — OXYCODONE HCL 5 MG/5ML PO SOLN: 5.0000 mg | Freq: Once | ORAL | Status: DC | PRN

## 2022-04-20 MED ORDER — CHLORHEXIDINE GLUCONATE 0.12 % MT SOLN: 15.0000 mL | Freq: Once | OROMUCOSAL | Status: AC

## 2022-04-20 MED ORDER — HEPARIN SOD (PORK) LOCK FLUSH 100 UNIT/ML IV SOLN
INTRAVENOUS | Status: AC
Start: 2022-04-20 — End: ?
  Filled 2022-04-20: qty 5

## 2022-04-20 MED ORDER — CHLORHEXIDINE GLUCONATE CLOTH 2 % EX PADS: 6.0000 | MEDICATED_PAD | Freq: Once | CUTANEOUS | Status: DC

## 2022-04-20 MED ORDER — PHENYLEPHRINE 80 MCG/ML (10ML) SYRINGE FOR IV PUSH (FOR BLOOD PRESSURE SUPPORT)
PREFILLED_SYRINGE | INTRAVENOUS | Status: DC | PRN
  Administered 2022-04-20: 240 ug via INTRAVENOUS
  Administered 2022-04-20: 160 ug via INTRAVENOUS
  Administered 2022-04-20 (×2): 80 ug via INTRAVENOUS

## 2022-04-20 MED ORDER — 0.9 % SODIUM CHLORIDE (POUR BTL) OPTIME
TOPICAL | Status: DC | PRN
  Administered 2022-04-20: 1000 mL

## 2022-04-20 MED ORDER — HEPARIN SOD (PORK) LOCK FLUSH 100 UNIT/ML IV SOLN
INTRAVENOUS | Status: DC | PRN
  Administered 2022-04-20: 500 [IU]

## 2022-04-20 MED ORDER — LACTATED RINGERS IV SOLN: INTRAVENOUS | Status: DC

## 2022-04-20 MED ORDER — FENTANYL CITRATE (PF) 100 MCG/2ML IJ SOLN
INTRAMUSCULAR | Status: DC | PRN
Start: 2022-04-20 — End: 2022-04-20
  Administered 2022-04-20: 50 ug via INTRAVENOUS

## 2022-04-20 MED ORDER — PROPOFOL 10 MG/ML IV BOLUS
INTRAVENOUS | Status: AC
  Filled 2022-04-20: qty 20

## 2022-04-20 MED ORDER — HEPARIN 6000 UNIT IRRIGATION SOLUTION
Status: DC | PRN
  Administered 2022-04-20: 1

## 2022-04-20 MED ORDER — FENTANYL CITRATE (PF) 250 MCG/5ML IJ SOLN
INTRAMUSCULAR | Status: AC
  Filled 2022-04-20: qty 5

## 2022-04-20 MED ORDER — CHLORHEXIDINE GLUCONATE 0.12 % MT SOLN
OROMUCOSAL | Status: AC
  Administered 2022-04-20: 15 mL via OROMUCOSAL
  Filled 2022-04-20: qty 15

## 2022-04-20 MED ORDER — HYDROMORPHONE HCL 1 MG/ML IJ SOLN: 0.2500 mg | INTRAMUSCULAR | Status: DC | PRN

## 2022-04-20 MED ORDER — LACTATED RINGERS IV SOLN: INTRAVENOUS | Status: DC | PRN

## 2022-04-20 MED ORDER — MEPERIDINE HCL 25 MG/ML IJ SOLN: 6.2500 mg | INTRAMUSCULAR | Status: DC | PRN

## 2022-04-20 MED ORDER — BUPIVACAINE-EPINEPHRINE 0.25% -1:200000 IJ SOLN
INTRAMUSCULAR | Status: DC | PRN
  Administered 2022-04-20: 12 mL

## 2022-04-20 MED ORDER — MIDAZOLAM HCL 2 MG/2ML IJ SOLN
INTRAMUSCULAR | Status: AC
Start: 2022-04-20 — End: ?
  Filled 2022-04-20: qty 2

## 2022-04-20 MED ORDER — OXYCODONE HCL 5 MG PO TABS: 5.0000 mg | ORAL_TABLET | Freq: Four times a day (QID) | ORAL | 0 refills | Status: DC | PRN

## 2022-04-20 MED ORDER — MIDAZOLAM HCL 2 MG/2ML IJ SOLN
INTRAMUSCULAR | Status: DC | PRN
  Administered 2022-04-20: 2 mg via INTRAVENOUS

## 2022-04-20 MED ORDER — ACETAMINOPHEN 500 MG PO TABS: 1000.0000 mg | ORAL_TABLET | ORAL | Status: DC

## 2022-04-20 MED ORDER — ACETAMINOPHEN 500 MG PO TABS
ORAL_TABLET | ORAL | Status: AC
  Filled 2022-04-20: qty 2

## 2022-04-20 MED ORDER — PROMETHAZINE HCL 25 MG/ML IJ SOLN: 6.2500 mg | INTRAMUSCULAR | Status: DC | PRN

## 2022-04-20 MED ORDER — ONDANSETRON HCL 4 MG/2ML IJ SOLN
INTRAMUSCULAR | Status: DC | PRN
  Administered 2022-04-20: 4 mg via INTRAVENOUS

## 2022-04-20 MED ORDER — HEPARIN 6000 UNIT IRRIGATION SOLUTION
Status: AC
  Filled 2022-04-20: qty 500

## 2022-04-20 MED ORDER — PROPOFOL 10 MG/ML IV BOLUS
INTRAVENOUS | Status: DC | PRN
  Administered 2022-04-20: 200 mg via INTRAVENOUS

## 2022-04-20 MED ORDER — ORAL CARE MOUTH RINSE
15.0000 mL | Freq: Once | OROMUCOSAL | Status: AC
Start: 2022-04-20 — End: 2022-04-20

## 2022-04-20 MED ORDER — GABAPENTIN 300 MG PO CAPS
ORAL_CAPSULE | ORAL | Status: AC
  Administered 2022-04-20: 300 mg via ORAL
  Filled 2022-04-20: qty 1

## 2022-04-20 SURGICAL SUPPLY — 42 items
ADH SKN CLS APL DERMABOND .7 (GAUZE/BANDAGES/DRESSINGS) ×1
APL PRP STRL LF DISP 70% ISPRP (MISCELLANEOUS) ×1
BAG COUNTER SPONGE SURGICOUNT (BAG) ×2 IMPLANT
BAG DECANTER FOR FLEXI CONT (MISCELLANEOUS) ×2 IMPLANT
BAG SPNG CNTER NS LX DISP (BAG) ×1
CHLORAPREP W/TINT 26 (MISCELLANEOUS) ×2 IMPLANT
COVER SURGICAL LIGHT HANDLE (MISCELLANEOUS) ×2 IMPLANT
COVER TRANSDUCER ULTRASND GEL (DISPOSABLE) ×2 IMPLANT
DERMABOND ADVANCED (GAUZE/BANDAGES/DRESSINGS) ×1
DERMABOND ADVANCED .7 DNX12 (GAUZE/BANDAGES/DRESSINGS) ×1 IMPLANT
DRAPE C-ARM 42X120 X-RAY (DRAPES) ×2 IMPLANT
DRSG TEGADERM 4X4.75 (GAUZE/BANDAGES/DRESSINGS) ×2 IMPLANT
ELECT CAUTERY BLADE 6.4 (BLADE) ×2 IMPLANT
ELECT REM PT RETURN 9FT ADLT (ELECTROSURGICAL) ×2
ELECTRODE REM PT RTRN 9FT ADLT (ELECTROSURGICAL) ×1 IMPLANT
GAUZE SPONGE 4X4 12PLY STRL LF (GAUZE/BANDAGES/DRESSINGS) ×1 IMPLANT
GLOVE BIO SURGEON STRL SZ8 (GLOVE) ×2 IMPLANT
GLOVE BIOGEL PI IND STRL 8 (GLOVE) ×1 IMPLANT
GLOVE BIOGEL PI INDICATOR 8 (GLOVE) ×1
GOWN STRL REUS W/ TWL LRG LVL3 (GOWN DISPOSABLE) ×1 IMPLANT
GOWN STRL REUS W/ TWL XL LVL3 (GOWN DISPOSABLE) ×1 IMPLANT
GOWN STRL REUS W/TWL LRG LVL3 (GOWN DISPOSABLE) ×4
GOWN STRL REUS W/TWL XL LVL3 (GOWN DISPOSABLE) ×2
INTRODUCER COOK 11FR (CATHETERS) IMPLANT
KIT BASIN OR (CUSTOM PROCEDURE TRAY) ×2 IMPLANT
KIT PORT POWER 8FR ISP CVUE (Port) ×1 IMPLANT
KIT TURNOVER KIT B (KITS) ×2 IMPLANT
NS IRRIG 1000ML POUR BTL (IV SOLUTION) ×2 IMPLANT
PAD ARMBOARD 7.5X6 YLW CONV (MISCELLANEOUS) ×2 IMPLANT
PENCIL BUTTON HOLSTER BLD 10FT (ELECTRODE) ×2 IMPLANT
POSITIONER HEAD DONUT 9IN (MISCELLANEOUS) ×2 IMPLANT
SET INTRODUCER 12FR PACEMAKER (INTRODUCER) IMPLANT
SET SHEATH INTRODUCER 10FR (MISCELLANEOUS) IMPLANT
SHEATH COOK PEEL AWAY SET 9F (SHEATH) IMPLANT
SUT MNCRL AB 4-0 PS2 18 (SUTURE) ×2 IMPLANT
SUT PROLENE 2 0 SH 30 (SUTURE) ×2 IMPLANT
SUT VIC AB 3-0 SH 27 (SUTURE) ×2
SUT VIC AB 3-0 SH 27X BRD (SUTURE) ×1 IMPLANT
SYR 5ML LUER SLIP (SYRINGE) ×2 IMPLANT
TOWEL GREEN STERILE (TOWEL DISPOSABLE) ×2 IMPLANT
TOWEL GREEN STERILE FF (TOWEL DISPOSABLE) ×2 IMPLANT
TRAY LAPAROSCOPIC MC (CUSTOM PROCEDURE TRAY) ×2 IMPLANT

## 2022-04-20 NOTE — H&P (Signed)
Chief Complaint: New Consultation (Left Breast Cancer )   History of Present Illness: Caitlyn Torres is a 61 y.o. female who is seen today as an office consultation for evaluation of New Consultation (Left Breast Cancer ) .   Patient presents for evaluation of newly diagnosed left breast cancer. She underwent recent screening mammogram was noted to have 2 nodules left breast upper outer quadrant each measuring roughly 1.3 cm. This is at the 2 o'clock position. 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 Of note her sister had breast cancer in her 20s. She also relates a family history of prostate cancer as well. Since the biopsy, the areas are sore and mildly swollen. Also, she had an abnormal lymph node in the left axilla core biopsy showed metastatic disease involving the left axilla.  Review of Systems: A complete review of systems was obtained from the patient. I have reviewed this information and discussed as appropriate with the patient. See HPI as well for other ROS.    Medical History: Past Medical History:  Diagnosis Date   Anxiety   Arthritis   History of cancer   There is no problem list on file for this patient.  Past Surgical History:  Procedure Laterality Date   CAUTERIZATION CERVIX BY LASER 1989   Skin Cancer 08/2020    Allergies  Allergen Reactions   Acetaminophen Swelling   Aspirin Swelling   Nsaids (Non-Steroidal Anti-Inflammatory Drug) Swelling   Cpm-Diphenhyd-Pse-Acetaminophn Hives   Ibuprofen Unknown   Macrolide Antibiotics Other (See Comments)  Flu like sx   Naproxen Sodium Unknown   Nitrofurantoin Monohyd/M-Cryst Other (See Comments) and Unknown  Flu like symptoms, fever 103   Current Outpatient Medications on File Prior to Visit  Medication Sig Dispense Refill   biotin 10 mg Tab Take by mouth   cetirizine (ZYRTEC) 10 MG tablet Take 10 mg by mouth once daily   EPINEPHrine (EPIPEN) 0.3 mg/0.3  mL auto-injector as directed   multivitamin tablet Take 1 tablet by mouth once daily   No current facility-administered medications on file prior to visit.   History reviewed. No pertinent family history.   Social History   Tobacco Use  Smoking Status Former   Types: Cigarettes  Smokeless Tobacco Never    Social History   Socioeconomic History   Marital status: Life Partner  Tobacco Use   Smoking status: Former  Types: Cigarettes   Smokeless tobacco: Never  Substance and Sexual Activity   Alcohol use: Yes   Drug use: Never   Objective:   Vitals:  03/27/22 0924  BP: 124/70  Pulse: 62  Temp: 36.2 C (97.1 F)  SpO2: 99%  Weight: 55.6 kg (122 lb 9.6 oz)  Height: 160 cm ($Remov'5\' 3"'pcISIX$ )   Body mass index is 21.72 kg/m.  Physical Exam Eyes:  Pupils: Pupils are equal, round, and reactive to light.  Cardiovascular:  Rate and Rhythm: Normal rate.  Chest:  Breasts: Right: No mass, skin change or tenderness.  Left: Swelling and mass present. No skin change or tenderness.   Comments: Area of swelling and fullness measuring about 3 cm corresponding to biopsy site. Mobile. Left breast   Musculoskeletal:  General: Normal range of motion.  Cervical back: Normal range of motion.  Lymphadenopathy:  Upper Body:  Right upper body: No supraclavicular or axillary adenopathy.  Left upper body: Axillary adenopathy present. No supraclavicular adenopathy.  Skin: General: Skin is warm.  Neurological:  General: No focal deficit present.  Mental Status: She is alert.  Psychiatric:  Mood and Affect: Mood normal.  Behavior: Behavior normal.     Labs, Imaging and Diagnostic Testing: CLINICAL DATA: 61 year old female recalled from screening mammogram dated 02/01/2022 for a possible left breast mass. Patient has a strong family history of breast cancer with her sister diagnosed in her 70s.   EXAM: DIGITAL DIAGNOSTIC UNILATERAL LEFT MAMMOGRAM WITH TOMOSYNTHESIS AND CAD; ULTRASOUND  LEFT BREAST LIMITED   TECHNIQUE: Left digital diagnostic mammography and breast tomosynthesis was performed. The images were evaluated with computer-aided detection.; Targeted ultrasound examination of the left breast was performed.   COMPARISON: Previous exam(s).   ACR Breast Density Category d: The breast tissue is extremely dense, which lowers the sensitivity of mammography.   FINDINGS: There is a persistent irregular, spiculated mass in the upper-outer quadrant of the left breast at posterior depth. Further evaluation with ultrasound was performed.   Targeted ultrasound is performed, showing 2 adjacent oval, hypoechoic masses with irregular margins at the 2 o'clock and 2:30 positions 8 cm from the nipple. They measure 1.5 x 1.3 x 0.9 cm and 1.3 x 0.8 x 0.5 cm respectively. There is associated vascularity. The largest of the 2 masses corresponds with the mammographic finding and may represent an abnormal intramammary lymph node. Evaluation of the left axilla demonstrates a single morphologically abnormal lymph node with cortical thickening up to 6 mm. Additional axillary lymph nodes appear normal.   IMPRESSION: 1. Two adjacent suspicious masses at the 2 o'clock and 2:30 positions of the left breast. Recommendation is for ultrasound-guided biopsy of both masses. 2. Single suspicious left axillary lymph node. Recommendation is for ultrasound-guided biopsy.   RECOMMENDATION: Three area ultrasound-guided biopsy to include 2 adjacent left breast masses and an abnormal left axillary lymph node.   I have discussed the findings and recommendations with the patient. If applicable, a reminder letter will be sent to the patient regarding the next appointment.   BI-RADS CATEGORY 5: Highly suggestive of malignancy.     Electronically Signed By: Kristopher Oppenheim M.D. On: 02/28/2022 10:21  ADDITIONAL INFORMATION: 1. FLUORESCENCE IN-SITU HYBRIDIZATION Results: GROUP 1: HER2  **POSITIVE** On the tissue sample received from this individual HER2 FISH was performed by a technologist and cell imaging and analysis on the [employer]. RATIO OF HER2/CEN 17 SIGNALS 2.45 AVERAGE HER2 COPY NUMBER PER CELL 4.90 The ratio of HER2/CEN 17 result exceeds the cutoff value of >=2.0 and a copy number of HER2 signals exceeding the cutoff range of >=4.0 signals per cell. Arch Pathol Lab Med 1:1,2018 Tobin Chad MD Pathologist, Electronic Signature ( Signed 03/17/2022) 1. PROGNOSTIC INDICATORS Results: IMMUNOHISTOCHEMICAL AND MORPHOMETRIC ANALYSIS PERFORMED MANUALLY The tumor cells are EQUIVOCAL for Her2 (2+). Her2 by FISH will be performed and the results reported separately. Estrogen Receptor: 95%, POSITIVE, WEAK-MODERATE STAINING INTENSITY Progesterone Receptor: 0%, NEGATIVE Proliferation Marker Ki67: 95% COMMENT: The negative hormone receptor study(ies) in this case has an internal positive control. REFERENCE RANGE ESTROGEN RECEPTOR NEGATIVE 0% 1 of 4 FINAL for ANGLES, TREVIZO [phone]) ADDITIONAL INFORMATION:(continued) POSITIVE =>1% REFERENCE RANGE PROGESTERONE RECEPTOR NEGATIVE 0% POSITIVE =>1% All controls stained appropriately Tobin Chad MD Pathologist, Electronic Signature ( Signed 03/16/2022) 3. FLUORESCENCE IN-SITU HYBRIDIZATION Results: GROUP 2: HER2 **NEGATIVE** Equivocal form of amplification of the HER2 gene was detected in the IHC 2+ tissue sample received from this individual. HER2 FISH was performed by a technologist and cell imaging and analysis on the [employer]View. First Analysis: RATIO OF HER2/CEN 17 SIGNALS 2.15 AVERAGE HER2 COPY NUMBER PER  CELL 3.55 An additional 20 cells was analyzed by FISH in a separate region with invasive cancer. Additional Analysis: RATIO OF HER2/CEN 17 SIGNALS 2.00 AVERAGE HER2 COPY NUMBER PER CELL 3.40 The ratio of HER2/CEN 17 is within the normal range >=2.0 of HER2/CEN 17 and a copy number of HER2 signals  per cell is < 4.0. Arch Pathol Lab Med 1:1,2018 COMMENT: Evidence is limited on the efficacy of HER2-targeted therapy in the small subset of cases with HER2/CEN 17 ratio >=2.0 and an average HER2 copy number <4.0/cell. In the first generation of adjuvant trastuzumab trials, patients in this subgroup who were randomized to the trastuzumab arm did not appear to derive an improvement in disease free or overall survival, but there were too few such cases to draw definitive conclusions. IHC expression for HER2 should be used to complement ISH and define HER2 status. If IHC result is not 3+ positive, it is recommended that the specimen be considered HER2 negative because of the low HER2 copy number by ISH and lack of protein overexpression. Tobin Chad MD Pathologist, Electronic Signature ( Signed 03/17/2022) 3. PROGNOSTIC INDICATORS Results: IMMUNOHISTOCHEMICAL AND MORPHOMETRIC ANALYSIS PERFORMED MANUALLY 2 of 4 FINAL for SUKHMAN, KOCHER [phone]) ADDITIONAL INFORMATION:(continued) The tumor cells are EQUIVOCAL for Her2 (2+). Her2 by FISH will be performed and the results reported separately. Estrogen Receptor: 95%, POSITIVE, WEAK-MODERATE STAINING INTENSITY Progesterone Receptor: 0%, NEGATIVE COMMENT: The negative hormone receptor study(ies) in this case has an internal positive control. REFERENCE RANGE ESTROGEN RECEPTOR NEGATIVE 0% POSITIVE =>1% REFERENCE RANGE PROGESTERONE RECEPTOR NEGATIVE 0% POSITIVE =>1% All controls stained appropriately Tobin Chad MD Pathologist, Electronic Signature ( Signed 03/16/2022) FINAL DIAGNOSIS Diagnosis 1. Breast, left, needle core biopsy, 2:00 - INVASIVE DUCTAL CARCINOMA - SEE COMMENT 2. Breast, left, needle core biopsy, 2:30 - INVASIVE DUCTAL CARCINOMA - SEE COMMENT 3. Lymph node, needle/core biopsy, left axilla - METASTATIC CARCINOMA INVOLVING NODAL TISSUE Microscopic Comment 1. and 2. The carcinoma has a syncytial growth  pattern. Based on the biopsy, the carcinoma appears Nottingham grade 3 of 3 and measures 1.0 cm in greatest linear extent. Prognostic markers (ER/PR/ki-67/HER2) are pending and will be reported in an addendum. Dr. Thornell Sartorius reviewed the case and agrees with the above diagnosis.  Assessment and Plan:   Diagnoses and all orders for this visit:  Malignant neoplasm of upper-outer quadrant of left breast in female, estrogen receptor negative (CMS-HCC) - MRI breast bilateral with and without contrast; Future - Ambulatory Referral to Genetics   Patient is scheduled to see oncology on Wednesday. She appears to be a good neoadjuvant chemotherapy candidate. I discussed this with her today as well as port placement for administration of this. I discussed breast conserving surgery with her, the management of the axilla in her circumstance given a positive node, as well as mastectomy and reconstruction. I recommend genetic counseling as well for her given her sisters history as well as hers. She will think all these options over. Discussed port placement and the pros and cons of this and the rationale for placing a port for chemotherapy. Risks and benefits of the surgery discussed. Complications of surgery to include but not exclusive of bleeding, infection, collapsed lung, injury to major vascular structure, nerve injury, the need for the treatment center procedures, catheter migration, catheter malfunction, catheter thrombosis, and less likely cardiovascular event, pulmonary event or death. Discussed breast conserving surgery as well with the goal of chemotherapy to downstage her disease. She will see oncology and let me know about her  final decision when she is seen in the oncologist.  No follow-ups on file.  Kennieth Francois, MD   I spent a total of 63 minutes in both face-to-face and non-face-to-face activities, excluding procedures performed, for this visit on the date of this encounter.   Electronically signed by Kennieth Francois, MD at 03/27/2022 12:02 PM EDT

## 2022-04-20 NOTE — Discharge Instructions (Signed)
PORT-A-CATH: POST OP INSTRUCTIONS  Always review your discharge instruction sheet given to you by the facility where your surgery was performed.   A prescription for pain medication may be given to you upon discharge. Take your pain medication as prescribed, if needed. If narcotic pain medicine is not needed, then you make take acetaminophen (Tylenol) or ibuprofen (Advil) as needed.  Take your usually prescribed medications unless otherwise directed. If you need a refill on your pain medication, please contact our office. All narcotic pain medicine now requires a paper prescription.  Phoned in and fax refills are no longer allowed by law.  Prescriptions will not be filled after 5 pm or on weekends.  You should follow a light diet for the remainder of the day after your procedure. Most patients will experience some mild swelling and/or bruising in the area of the incision. It may take several days to resolve. It is common to experience some constipation if taking pain medication after surgery. Increasing fluid intake and taking a stool softener (such as Colace) will usually help or prevent this problem from occurring. A mild laxative (Milk of Magnesia or Miralax) should be taken according to package directions if there are no bowel movements after 48 hours.  Unless discharge instructions indicate otherwise, you may remove your bandages 48 hours after surgery, and you may shower at that time. You may have steri-strips (small white skin tapes) in place directly over the incision.  These strips should be left on the skin for 7-10 days.  If your surgeon used Dermabond (skin glue) on the incision, you may shower in 24 hours.  The glue will flake off over the next 2-3 weeks.  If your port is left accessed at the end of surgery (needle left in port), the dressing cannot get wet and should only by changed by a healthcare professional. When the port is no longer accessed (when the needle has been removed), follow  step 7.   ACTIVITIES:  Limit activity involving your arms for the next 72 hours. Do no strenuous exercise or activity for 1 week. You may drive when you are no longer taking prescription pain medication, you can comfortably wear a seatbelt, and you can maneuver your car. 10.You may need to see your doctor in the office for a follow-up appointment.  Please       check with your doctor.  11.When you receive a new Port-a-Cath, you will get a product guide and        ID card.  Please keep them in case you need them.  WHEN TO CALL YOUR DOCTOR ([phone]): Fever over 101.0 Chills Continued bleeding from incision Increased redness and tenderness at the site Shortness of breath, difficulty breathing   The clinic staff is available to answer your questions during regular business hours. Please don't hesitate to call and ask to speak to one of the nurses or medical assistants for clinical concerns. If you have a medical emergency, go to the nearest emergency room or call 911.  A surgeon from [facility] is always on call at the hospital.     For further information, please visit [email or web address]      

## 2022-04-20 NOTE — Anesthesia Procedure Notes (Signed)
Procedure Name: LMA Insertion Date/Time: 04/20/2022 7:37 AM  Performed by: Leonor Liv, CRNAPre-anesthesia Checklist: Patient identified, Emergency Drugs available, Suction available and Patient being monitored Patient Re-evaluated:Patient Re-evaluated prior to induction Oxygen Delivery Method: Circle System Utilized Preoxygenation: Pre-oxygenation with 100% oxygen Induction Type: IV induction Ventilation: Mask ventilation without difficulty LMA: LMA inserted LMA Size: 4.0 Number of attempts: 1 Placement Confirmation: positive ETCO2 Tube secured with: Tape Dental Injury: Teeth and Oropharynx as per pre-operative assessment

## 2022-04-20 NOTE — Progress Notes (Signed)
Called pt to introduce myself as her Arboriculturist and to discuss copay assistance.  Pt gave me consent to apply in her behalf so I applied to the [employer].  She is approved for $25,000 for Perjeta from 04/20/22.  Pt will pay as little as $5 for each treatment.  I also completed the online application with the [employer] for [employer].  The app is pending so I will notify her of the outcome once I receive it. Pt exceeds the income requirement for the [employer].  I will give her my card to contact me if she has any questions or concerns in the future.

## 2022-04-20 NOTE — Op Note (Signed)
Preoperative diagnosis: PAC needed for chemotherapy   Postoperative diagnosis: Same  Procedure: Portacath Placement with C arm and U/S guidance   Surgeon: Turner Daniels, MD, FACS  Anesthesia: General and 0.25 % marcaine with epinephrine  Clinical History and Indications: The patient is getting ready to begin chemotherapy for her cancer. She  needs a Port-A-Cath for venous access. Risk of bleeding, infection,  Collapse lung,  Death,  DVT,  Organ injury,  Mediastinal injury,  Injury to heart,  Injury to blood vessels,  Nerves,  Migration of catheter,  Embolization of catheter and the need for more surgery.  Description of Procedure: I have seen the patient in the holding area and confirmed the plans for the procedure as noted above. I reviewed the risks and complications again and the patient has no further questions. She wishes to proceed.   The patient was then taken to the operating room. After satisfactory general  anesthesia had been obtained the upper chest and lower neck were prepped and draped as a sterile field. The timeout was done.  The right internal jugular vein  was entered under U/S guidance  and the guidewire threaded into the superior vena cava right atrial area under fluoroscopic guidance. An incision was then made on the anterior chest wall and a subcutaneous pocket fashioned for the port reservoir.  The port tubing was then brought through a subcutaneous tunnel from the port site to the guidewire site.  The port and catheter were attached, locked  and flushed. The catheter was measured and cut to appropriate length.The dilator and peel-away sheath were then advanced over the guidewire while monitoring this with fluoroscopy. The guidewire and dilator were removed and the tubing threaded to approximately 20 cm. The peel-away sheath was then removed. The catheter aspirated and flushed easily. Using fluoroscopy the tip was in the superior vena cava right atrial junction area. It  aspirated and flushed easily. That aspirated and flushed easily.  The reservoir was secured to the fascia with 1 sutures of 2-0 Prolene. A final check with fluoroscopy was done to make sure we had no kinks and good positioning of the tip of the catheter. Everything appeared to be okay. The catheter was aspirated, flushed with dilute heparin and then concentrated aqueous heparin.  The port was accessed for chemotherapy.  The incision was then closed with interrupted 3-0 Vicryl, and 4-0 Monocryl subcuticular with Dermabond on the skin.  There were no operative complications. Estimated blood loss was minimal. All counts were correct. The patient tolerated the procedure well.  Turner Daniels, MD, FACS

## 2022-04-20 NOTE — Transfer of Care (Signed)
Immediate Anesthesia Transfer of Care Note  Patient: Caitlyn Torres  Procedure(s) Performed: PORT PLACEMENT (Right: Chest)  Patient Location: PACU  Anesthesia Type:General  Level of Consciousness: awake, alert  and oriented  Airway & Oxygen Therapy: Patient Spontanous Breathing and Patient connected to face mask oxygen  Post-op Assessment: Report given to RN, Post -op Vital signs reviewed and stable and Patient moving all extremities  Post vital signs: Reviewed and stable  Last Vitals:  Vitals Value Taken Time  BP 107/42 04/20/22 0842  Temp    Pulse 67 04/20/22 0853  Resp 22 04/20/22 0853  SpO2 100 % 04/20/22 0853  Vitals shown include unvalidated device data.  Last Pain:  Vitals:   04/20/22 0642  TempSrc:   PainSc: 0-No pain         Complications: No notable events documented.

## 2022-04-20 NOTE — Anesthesia Preprocedure Evaluation (Signed)
Anesthesia Evaluation  Patient identified by MRN, date of birth, ID band Patient awake    Reviewed: Allergy & Precautions, NPO status , Patient's Chart, lab work & pertinent test results  Airway Mallampati: II  TM Distance: >3 FB Neck ROM: Full    Dental no notable dental hx.    Pulmonary neg pulmonary ROS, former smoker,    Pulmonary exam normal breath sounds clear to auscultation       Cardiovascular negative cardio ROS Normal cardiovascular exam Rhythm:Regular Rate:Normal     Neuro/Psych negative neurological ROS  negative psych ROS   GI/Hepatic negative GI ROS, Neg liver ROS,   Endo/Other  negative endocrine ROS  Renal/GU negative Renal ROS  negative genitourinary   Musculoskeletal negative musculoskeletal ROS (+)   Abdominal   Peds negative pediatric ROS (+)  Hematology negative hematology ROS (+)   Anesthesia Other Findings Breast Cancer  Reproductive/Obstetrics negative OB ROS                             Anesthesia Physical Anesthesia Plan  ASA: 3  Anesthesia Plan: General   Post-op Pain Management: Gabapentin PO (pre-op)* and Minimal or no pain anticipated   Induction: Intravenous  PONV Risk Score and Plan: 3 and Ondansetron, Dexamethasone, Midazolam and Treatment may vary due to age or medical condition  Airway Management Planned: LMA  Additional Equipment:   Intra-op Plan:   Post-operative Plan: Extubation in OR  Informed Consent: I have reviewed the patients History and Physical, chart, labs and discussed the procedure including the risks, benefits and alternatives for the proposed anesthesia with the patient or authorized representative who has indicated his/her understanding and acceptance.     Dental advisory given  Plan Discussed with: CRNA  Anesthesia Plan Comments:         Anesthesia Quick Evaluation

## 2022-04-20 NOTE — Interval H&P Note (Signed)
History and Physical Interval Note:  04/20/2022 7:26 AM  Caitlyn Torres  has presented today for surgery, with the diagnosis of ACCESS FOR CHEMOTHERAPY.  The various methods of treatment have been discussed with the patient and family. After consideration of risks, benefits and other options for treatment, the patient has consented to  Procedure(s): PORT PLACEMENT (N/A) as a surgical intervention.  The patient's history has been reviewed, patient examined, no change in status, stable for surgery.  I have reviewed the patient's chart and labs.  Questions were answered to the patient's satisfaction.     [name]

## 2022-04-21 ENCOUNTER — Inpatient Hospital Stay: Payer: BC Managed Care – PPO | Admitting: Licensed Clinical Social Worker

## 2022-04-21 ENCOUNTER — Inpatient Hospital Stay: Payer: BC Managed Care – PPO

## 2022-04-21 ENCOUNTER — Other Ambulatory Visit: Payer: Self-pay

## 2022-04-21 ENCOUNTER — Inpatient Hospital Stay: Payer: BC Managed Care – PPO | Attending: Hematology and Oncology

## 2022-04-21 ENCOUNTER — Encounter (HOSPITAL_COMMUNITY): Payer: Self-pay | Admitting: Surgery

## 2022-04-21 ENCOUNTER — Inpatient Hospital Stay: Payer: BC Managed Care – PPO | Admitting: Hematology and Oncology

## 2022-04-21 ENCOUNTER — Encounter: Payer: Self-pay | Admitting: *Deleted

## 2022-04-21 VITALS — BP 134/64 | HR 76 | Resp 15

## 2022-04-21 VITALS — BP 133/61 | HR 72 | Temp 98.2°F | Resp 16 | Ht 63.0 in | Wt 124.0 lb

## 2022-04-21 DIAGNOSIS — D72819 Decreased white blood cell count, unspecified: Secondary | ICD-10-CM | POA: Insufficient documentation

## 2022-04-21 DIAGNOSIS — Z5112 Encounter for antineoplastic immunotherapy: Secondary | ICD-10-CM | POA: Insufficient documentation

## 2022-04-21 DIAGNOSIS — Z170 Estrogen receptor positive status [ER+]: Secondary | ICD-10-CM | POA: Insufficient documentation

## 2022-04-21 DIAGNOSIS — Z7952 Long term (current) use of systemic steroids: Secondary | ICD-10-CM | POA: Diagnosis not present

## 2022-04-21 DIAGNOSIS — Z803 Family history of malignant neoplasm of breast: Secondary | ICD-10-CM | POA: Diagnosis not present

## 2022-04-21 DIAGNOSIS — R530 Neoplastic (malignant) related fatigue: Secondary | ICD-10-CM | POA: Insufficient documentation

## 2022-04-21 DIAGNOSIS — Z79899 Other long term (current) drug therapy: Secondary | ICD-10-CM | POA: Insufficient documentation

## 2022-04-21 DIAGNOSIS — R590 Localized enlarged lymph nodes: Secondary | ICD-10-CM | POA: Diagnosis not present

## 2022-04-21 DIAGNOSIS — Z79811 Long term (current) use of aromatase inhibitors: Secondary | ICD-10-CM | POA: Diagnosis not present

## 2022-04-21 DIAGNOSIS — Z5189 Encounter for other specified aftercare: Secondary | ICD-10-CM | POA: Insufficient documentation

## 2022-04-21 DIAGNOSIS — C50412 Malignant neoplasm of upper-outer quadrant of left female breast: Secondary | ICD-10-CM

## 2022-04-21 DIAGNOSIS — R110 Nausea: Secondary | ICD-10-CM | POA: Diagnosis not present

## 2022-04-21 DIAGNOSIS — R197 Diarrhea, unspecified: Secondary | ICD-10-CM | POA: Diagnosis not present

## 2022-04-21 DIAGNOSIS — Z800 Family history of malignant neoplasm of digestive organs: Secondary | ICD-10-CM | POA: Insufficient documentation

## 2022-04-21 LAB — CBC WITH DIFFERENTIAL/PLATELET
Abs Immature Granulocytes: 0.01 10*3/uL (ref 0.00–0.07)
Basophils Absolute: 0 10*3/uL (ref 0.0–0.1)
Basophils Relative: 0 %
Eosinophils Absolute: 0.1 10*3/uL (ref 0.0–0.5)
Eosinophils Relative: 1 %
HCT: 40.5 % (ref 36.0–46.0)
Hemoglobin: 14 g/dL (ref 12.0–15.0)
Immature Granulocytes: 0 %
Lymphocytes Relative: 29 %
Lymphs Abs: 2 10*3/uL (ref 0.7–4.0)
MCH: 29.4 pg (ref 26.0–34.0)
MCHC: 34.6 g/dL (ref 30.0–36.0)
MCV: 84.9 fL (ref 80.0–100.0)
Monocytes Absolute: 0.6 10*3/uL (ref 0.1–1.0)
Monocytes Relative: 9 %
Neutro Abs: 4.2 10*3/uL (ref 1.7–7.7)
Neutrophils Relative %: 61 %
Platelets: 151 10*3/uL (ref 150–400)
RBC: 4.77 MIL/uL (ref 3.87–5.11)
RDW: 14.7 % (ref 11.5–15.5)
WBC: 6.9 10*3/uL (ref 4.0–10.5)
nRBC: 0 % (ref 0.0–0.2)

## 2022-04-21 LAB — COMPREHENSIVE METABOLIC PANEL
ALT: 16 U/L (ref 0–44)
AST: 17 U/L (ref 15–41)
Albumin: 4.5 g/dL (ref 3.5–5.0)
Alkaline Phosphatase: 44 U/L (ref 38–126)
Anion gap: 7 (ref 5–15)
BUN: 8 mg/dL (ref 6–20)
CO2: 28 mmol/L (ref 22–32)
Calcium: 9.7 mg/dL (ref 8.9–10.3)
Chloride: 103 mmol/L (ref 98–111)
Creatinine, Ser: 0.72 mg/dL (ref 0.44–1.00)
GFR, Estimated: >60 mL/min (ref >60)
Glucose, Bld: 121 mg/dL — ABNORMAL HIGH (ref 70–99)
Potassium: 3.9 mmol/L (ref 3.5–5.1)
Sodium: 138 mmol/L (ref 135–145)
Total Bilirubin: 0.5 mg/dL (ref 0.3–1.2)
Total Protein: 6.8 g/dL (ref 6.5–8.1)

## 2022-04-21 MED ORDER — SODIUM CHLORIDE 0.9 % IV SOLN
420.0000 mg | Freq: Once | INTRAVENOUS | Status: AC
  Administered 2022-04-21: 420 mg via INTRAVENOUS
  Filled 2022-04-21: qty 14

## 2022-04-21 MED ORDER — TRASTUZUMAB-DKST CHEMO 150 MG IV SOLR
8.0000 mg/kg | Freq: Once | INTRAVENOUS | Status: AC
  Administered 2022-04-21: 441 mg via INTRAVENOUS
  Filled 2022-04-21: qty 21

## 2022-04-21 MED ORDER — DIPHENHYDRAMINE HCL 25 MG PO CAPS
50.0000 mg | ORAL_CAPSULE | Freq: Once | ORAL | Status: AC
  Administered 2022-04-21: 50 mg via ORAL
  Filled 2022-04-21: qty 2

## 2022-04-21 MED ORDER — SODIUM CHLORIDE 0.9 % IV SOLN: Freq: Once | INTRAVENOUS | Status: AC

## 2022-04-21 NOTE — Progress Notes (Signed)
Per Dr. Chryl Heck, "OK To Start Premeds without CMET".

## 2022-04-21 NOTE — Progress Notes (Signed)
Patient Care Team: Bradd Canary, MD as PCP - General (Family Medicine) Mauro Kaufmann, RN as Oncology Nurse Navigator Rockwell Germany, RN as Oncology Nurse Navigator Nicholas Lose, MD as Consulting Physician (Hematology and Oncology)  DIAGNOSIS:  No diagnosis found.   SUMMARY OF ONCOLOGIC HISTORY: Oncology History  Malignant neoplasm of upper-outer quadrant of left breast in female, estrogen receptor positive ([place])  03/14/2022 Initial Diagnosis   Screening detected left breast masses 1.5 cm and 1.3 cm with enlarged left axillary lymph node, biopsy revealed grade 3 IDC with lymph node being positive, ER 95%, PR 0%, HER2 2+ by IHC and FISH positive with a ratio 2.45 and a copy #4.9, the second biopsy was HER2 negative with a ratio 2.15 and a copy #3.55   03/29/2022 Cancer Staging   Staging form: Breast, AJCC 8th Edition - Clinical: Stage IIA (cT1c, cN1, cM0, G3, ER+, PR-, HER2+) - Signed by Nicholas Lose, MD on 03/29/2022 Stage prefix: Initial diagnosis Histologic grading system: 3 grade system   04/21/2022 -  Chemotherapy   Patient is on Treatment Plan : BREAST  Docetaxel + Carboplatin + Trastuzumab + Pertuzumab  (TCHP) q21d        CHIEF COMPLIANT: Follow up to discuss [facility] Perjeta   INTERVAL HISTORY: Caitlyn Torres is a 61 y.o. female is here because of recent diagnosis of left breast cancer.  Patient is here for follow-up with her husband before planned Herceptin and pertuzumab.  She had her port placed yesterday, some bruising around the area.  She otherwise denies any new complaints at all.  Husband states that they have changed her diet quite a bit and they are hoping to just continue the antibodytherapy.  ALLERGIES:  is allergic to aleve [naproxen sodium], aspirin, macrobid [nitrofurantoin], motrin [ibuprofen], and tylenol [acetaminophen].  MEDICATIONS:  Current Outpatient Medications  Medication Sig Dispense Refill   carboxymethylcellulose (REFRESH PLUS) 0.5 % SOLN  1 drop 3 (three) times daily as needed (irritation).     cetirizine (ZYRTEC) 10 MG tablet Take 10 mg by mouth daily.     dexamethasone (DECADRON) 4 MG tablet Take 1 tablet (4 mg total) by mouth daily. Take 1 tablet day before chemo and 1 tablet day after chemo with food (Patient not taking: Reported on 04/11/2022) 12 tablet 0   EPINEPHrine 0.3 mg/0.3 mL IJ SOAJ injection Inject 0.3 mg into the muscle as needed for anaphylaxis.     letrozole (FEMARA) 2.5 MG tablet Take 1 tablet (2.5 mg total) by mouth daily. 90 tablet 3   lidocaine-prilocaine (EMLA) cream Apply to affected area once (Patient not taking: Reported on 04/11/2022) 30 g 3   olopatadine (PATANOL) 0.1 % ophthalmic solution Place 1 drop into both eyes daily as needed for allergies.     ondansetron (ZOFRAN) 8 MG tablet Take 1 tablet (8 mg total) by mouth 2 (two) times daily as needed (Nausea or vomiting). Start on the third day after chemotherapy. (Patient not taking: Reported on 04/11/2022) 30 tablet 1   oxyCODONE (OXY IR/ROXICODONE) 5 MG immediate release tablet Take 1 tablet (5 mg total) by mouth every 6 (six) hours as needed for severe pain. 15 tablet 0   prochlorperazine (COMPAZINE) 10 MG tablet Take 1 tablet (10 mg total) by mouth every 6 (six) hours as needed (Nausea or vomiting). (Patient not taking: Reported on 04/11/2022) 30 tablet 1   No current facility-administered medications for this visit.    PHYSICAL EXAMINATION: ECOG PERFORMANCE STATUS: 1 - Symptomatic but  completely ambulatory  Vitals:   04/21/22 0843  BP: 133/61  Pulse: 72  Resp: 16  Temp: 98.2 F (36.8 C)  SpO2: 100%    Filed Weights   04/21/22 0843  Weight: 124 lb (56.2 kg)     Physical Exam Constitutional:      Appearance: Normal appearance.  Chest:       Comments: Palpable left breast upper outer quadrant breast mass measuring around 2 to 3 cm in largest dimension.  I could not appreciate a small mass that was described on the MRI.  Palpable left  axillary lymphadenopathy measuring again approximately 2 cm in largest dimension. Musculoskeletal:     Cervical back: Normal range of motion and neck supple. No rigidity.  Lymphadenopathy:     Cervical: No cervical adenopathy.  Skin:    Findings: Rash (Some bruising around the port site which is expected) present.  Neurological:     Mental Status: She is alert.      LABORATORY DATA:  I have reviewed the data as listed    Latest Ref Rng & Units 04/13/2022    9:17 AM 03/07/2011    5:41 PM  CMP  Glucose 70 - 99 mg/dL 83  115   BUN 6 - 20 mg/dL 6  11   Creatinine 0.44 - 1.00 mg/dL 0.69  0.77   Sodium 135 - 145 mmol/L 137  137   Potassium 3.5 - 5.1 mmol/L 4.6  3.7   Chloride 98 - 111 mmol/L 105  106   CO2 22 - 32 mmol/L 25  22   Calcium 8.9 - 10.3 mg/dL 9.8  9.2     Lab Results  Component Value Date   WBC 3.3 (L) 04/13/2022   HGB 14.5 04/13/2022   HCT 43.2 04/13/2022   MCV 87.4 04/13/2022   PLT 143 (L) 04/13/2022   NEUTROABS 2.0 03/07/2011    ASSESSMENT & PLAN:   Malignant neoplasm of upper-outer quadrant of left breast in female, estrogen receptor positive ([place]) 03/14/2022:Screening detected left breast masses 1.5 cm and 1.3 cm with enlarged left axillary lymph node, biopsy revealed grade 3 IDC with lymph node being positive, ER 95%, PR 0%, HER2 2+ by IHC and FISH positive with a ratio 2.45 and a copy #4.9, the second biopsy was HER2 negative with a ratio 2.15 and a copy #3.55  Treatment plan based on multidisciplinary tumor board: 1. Neoadjuvant chemotherapy with Herceptin Perjeta and letrozole for 3 cycles.  Reassess with another MRI and then decide if she can continue with the same plan or do we need to add chemotherapy. (Our original recommendation is TCHP x6 cycles but patient is not willing to go through chemo right away) 2. Followed by breast conserving surgery if possible with sentinel lymph node study 3. Followed by adjuvant radiation therapy if patient had  lumpectomy 4.  Adjuvant antiestrogen therapy with neratinib   Plan: 1. Port placement 2. Echocardiogram: Normal 3. Chemotherapy class: Done  4. Breast MRI: Tomorrow  She is a patient of Dr. Lindi Adie and she refused to consider chemotherapy and wanted to proceed with antibody alone, Herceptin and Perjeta.  She is here before her first planned cycle of chemotherapy. CT chest abdomen pelvis without any evidence of metastatic disease.  Bone scan again with no definitive evidence of skeletal metastatic disease. We have discussed the imaging findings today.  We have discussed that a PCR rate with antibody alone could be in the order of 15 to 20% whereas with the combination  of chemotherapy and antibody therapy, PCR rate could be in the order of about 50%.  Once again they would like to proceed with Herceptin and pertuzumab alone for now.  They will see Mendel Ryder for toxicity check and they will return to clinic before cycle 2 with Dr. Lindi Adie. Okay to proceed with treatment as planned today  No orders of the defined types were placed in this encounter.  The patient has a good understanding of the overall plan. she agrees with it. she will call with any problems that may develop before the next visit here. Total time spent: 30 mins including face to face time and time spent for planning, charting and co-ordination of care   Benay Pike, MD 04/21/22

## 2022-04-21 NOTE — Patient Instructions (Signed)
[facility]COL[facility]  Discharge Instructions: Thank you for choosing [facility] to provide your oncology and hematology care.   If you have a lab appointment with the [facility], please go directly to the [facility] and check in at the registration area.   Wear comfortable clothing and clothing appropriate for easy access to any Portacath or PICC line.   We strive to give you quality time with your provider. You may need to reschedule your appointment if you arrive late (15 or more minutes).  Arriving late affects you and other patients whose appointments are after yours.  Also, if you miss three or more appointments without notifying the office, you may be dismissed from the clinic at the provider's discretion.      For prescription refill requests, have your pharmacy contact our office and allow 72 hours for refills to be completed.    Today you received the following chemotherapy and/or immunotherapy agents Trastuzumab-dkst and Pertuzumab      To help prevent nausea and vomiting after your treatment, we encourage you to take your nausea medication as directed.  BELOW ARE SYMPTOMS THAT SHOULD BE REPORTED IMMEDIATELY: *FEVER GREATER THAN 100.4 F (38 C) OR HIGHER *CHILLS OR SWEATING *NAUSEA AND VOMITING THAT IS NOT CONTROLLED WITH YOUR NAUSEA MEDICATION *UNUSUAL SHORTNESS OF BREATH *UNUSUAL BRUISING OR BLEEDING *URINARY PROBLEMS (pain or burning when urinating, or frequent urination) *BOWEL PROBLEMS (unusual diarrhea, constipation, pain near the anus) TENDERNESS IN MOUTH AND THROAT WITH OR WITHOUT PRESENCE OF ULCERS (sore throat, sores in mouth, or a toothache) UNUSUAL RASH, SWELLING OR PAIN  UNUSUAL VAGINAL DISCHARGE OR ITCHING   Items with * indicate a potential emergency and should be followed up as soon as possible or go to the Emergency Department if any problems should occur.  Please show the CHEMOTHERAPY ALERT CARD or IMMUNOTHERAPY ALERT  CARD at check-in to the Emergency Department and triage nurse.  Should you have questions after your visit or need to cancel or reschedule your appointment, please contact [facility]  Dept: [phone]  and follow the prompts.  Office hours are 8:00 a.m. to 4:30 p.m. Monday - Friday. Please note that voicemails left after 4:00 p.m. may not be returned until the following business day.  We are closed weekends and major holidays. You have access to a nurse at all times for urgent questions. Please call the main number to the clinic Dept: [phone] and follow the prompts.   For any non-urgent questions, you may also contact your provider using MyChart. We now offer e-Visits for anyone 25 and older to request care online for non-urgent symptoms. For details visit [email or web address].   Also download the MyChart app! Go to the app store, search "MyChart", open the app, select [facility], and log in with your MyChart username and password.  Due to Covid, a mask is required upon entering the hospital/clinic. If you do not have a mask, one will be given to you upon arrival. For doctor visits, patients may have 1 support person aged 52 or older with them. For treatment visits, patients cannot have anyone with them due to current Covid guidelines and our immunocompromised population.  Trastuzumab injection for infusion What is this medication? TRASTUZUMAB (tras TOO zoo mab) is a monoclonal antibody. It is used to treat breast cancer and stomach cancer. This medicine may be used for other purposes; ask your health care provider or pharmacist if you have questions. COMMON BRAND  NAME(S): Herceptin, Janae Bridgeman, Ontruzant, Trazimera What should I tell my care team before I take this medication? They need to know if you have any of these conditions: heart disease heart failure lung or breathing disease, like asthma an unusual or allergic reaction to  trastuzumab, benzyl alcohol, or other medications, foods, dyes, or preservatives pregnant or trying to get pregnant breast-feeding How should I use this medication? This drug is given as an infusion into a vein. It is administered in a hospital or clinic by a specially trained health care professional. Talk to your pediatrician regarding the use of this medicine in children. This medicine is not approved for use in children. Overdosage: If you think you have taken too much of this medicine contact a poison control center or emergency room at once. NOTE: This medicine is only for you. Do not share this medicine with others. What if I miss a dose? It is important not to miss a dose. Call your doctor or health care professional if you are unable to keep an appointment. What may interact with this medication? This medicine may interact with the following medications: certain types of chemotherapy, such as daunorubicin, doxorubicin, epirubicin, and idarubicin This list may not describe all possible interactions. Give your health care provider a list of all the medicines, herbs, non-prescription drugs, or dietary supplements you use. Also tell them if you smoke, drink alcohol, or use illegal drugs. Some items may interact with your medicine. What should I watch for while using this medication? Visit your doctor for checks on your progress. Report any side effects. Continue your course of treatment even though you feel ill unless your doctor tells you to stop. Call your doctor or health care professional for advice if you get a fever, chills or sore throat, or other symptoms of a cold or flu. Do not treat yourself. Try to avoid being around people who are sick. You may experience fever, chills and shaking during your first infusion. These effects are usually mild and can be treated with other medicines. Report any side effects during the infusion to your health care professional. Fever and chills usually  do not happen with later infusions. Do not become pregnant while taking this medicine or for 7 months after stopping it. Women should inform their doctor if they wish to become pregnant or think they might be pregnant. Women of child-bearing potential will need to have a negative pregnancy test before starting this medicine. There is a potential for serious side effects to an unborn child. Talk to your health care professional or pharmacist for more information. Do not breast-feed an infant while taking this medicine or for 7 months after stopping it. Women must use effective birth control with this medicine. What side effects may I notice from receiving this medication? Side effects that you should report to your doctor or health care professional as soon as possible: allergic reactions like skin rash, itching or hives, swelling of the face, lips, or tongue chest pain or palpitations cough dizziness feeling faint or lightheaded, falls fever general ill feeling or flu-like symptoms signs of worsening heart failure like breathing problems; swelling in your legs and feet unusually weak or tired Side effects that usually do not require medical attention (report to your doctor or health care professional if they continue or are bothersome): bone pain changes in taste diarrhea joint pain nausea/vomiting weight loss This list may not describe all possible side effects. Call your doctor for medical advice about side  effects. You may report side effects to FDA at 1-800-FDA-1088. Where should I keep my medication? This drug is given in a hospital or clinic and will not be stored at home. NOTE: This sheet is a summary. It may not cover all possible information. If you have questions about this medicine, talk to your doctor, pharmacist, or health care provider.  2023 Elsevier/Gold Standard (2016-11-14 00:00:00)  Pertuzumab injection What is this medication? PERTUZUMAB (per TOOZ ue mab) is a  monoclonal antibody. It is used to treat breast cancer. This medicine may be used for other purposes; ask your health care provider or pharmacist if you have questions. COMMON BRAND NAME(S): PERJETA What should I tell my care team before I take this medication? They need to know if you have any of these conditions: heart disease heart failure high blood pressure history of irregular heart beat recent or ongoing radiation therapy an unusual or allergic reaction to pertuzumab, other medicines, foods, dyes, or preservatives pregnant or trying to get pregnant breast-feeding How should I use this medication? This medicine is for infusion into a vein. It is given by a health care professional in a hospital or clinic setting. Talk to your pediatrician regarding the use of this medicine in children. Special care may be needed. Overdosage: If you think you have taken too much of this medicine contact a poison control center or emergency room at once. NOTE: This medicine is only for you. Do not share this medicine with others. What if I miss a dose? It is important not to miss your dose. Call your doctor or health care professional if you are unable to keep an appointment. What may interact with this medication? Interactions are not expected. Give your health care provider a list of all the medicines, herbs, non-prescription drugs, or dietary supplements you use. Also tell them if you smoke, drink alcohol, or use illegal drugs. Some items may interact with your medicine. This list may not describe all possible interactions. Give your health care provider a list of all the medicines, herbs, non-prescription drugs, or dietary supplements you use. Also tell them if you smoke, drink alcohol, or use illegal drugs. Some items may interact with your medicine. What should I watch for while using this medication? Your condition will be monitored carefully while you are receiving this medicine. Report any side  effects. Continue your course of treatment even though you feel ill unless your doctor tells you to stop. Do not become pregnant while taking this medicine or for 7 months after stopping it. Women should inform their doctor if they wish to become pregnant or think they might be pregnant. Women of child-bearing potential will need to have a negative pregnancy test before starting this medicine. There is a potential for serious side effects to an unborn child. Talk to your health care professional or pharmacist for more information. Do not breast-feed an infant while taking this medicine or for 7 months after stopping it. Women must use effective birth control with this medicine. Call your doctor or health care professional for advice if you get a fever, chills or sore throat, or other symptoms of a cold or flu. Do not treat yourself. Try to avoid being around people who are sick. You may experience fever, chills, and headache during the infusion. Report any side effects during the infusion to your health care professional. What side effects may I notice from receiving this medication? Side effects that you should report to your doctor or health care  professional as soon as possible: breathing problems chest pain or palpitations dizziness feeling faint or lightheaded fever or chills skin rash, itching or hives sore throat swelling of the face, lips, or tongue swelling of the legs or ankles unusually weak or tired Side effects that usually do not require medical attention (report to your doctor or health care professional if they continue or are bothersome): diarrhea hair loss nausea, vomiting tiredness This list may not describe all possible side effects. Call your doctor for medical advice about side effects. You may report side effects to FDA at 1-800-FDA-1088. Where should I keep my medication? This drug is given in a hospital or clinic and will not be stored at home. NOTE: This sheet is a  summary. It may not cover all possible information. If you have questions about this medicine, talk to your doctor, pharmacist, or health care provider.  2023 Elsevier/Gold Standard (2015-12-02 00:00:00)

## 2022-04-21 NOTE — Anesthesia Postprocedure Evaluation (Signed)
Anesthesia Post Note  Patient: Caitlyn Torres  Procedure(s) Performed: PORT PLACEMENT (Right: Chest)     Patient location during evaluation: PACU Anesthesia Type: General Level of consciousness: awake and alert Pain management: pain level controlled Vital Signs Assessment: post-procedure vital signs reviewed and stable Respiratory status: spontaneous breathing, nonlabored ventilation and respiratory function stable Cardiovascular status: blood pressure returned to baseline and stable Postop Assessment: no apparent nausea or vomiting Anesthetic complications: no   No notable events documented.  Last Vitals:  Vitals:   04/20/22 0857 04/20/22 0912  BP: 112/86 134/74  Pulse: 69 (!) 53  Resp: 20 14  Temp:  36.5 C  SpO2: 100% 100%    Last Pain:  Vitals:   04/20/22 0912  TempSrc:   PainSc: 0-No pain   Pain Goal:                   Lynda Rainwater

## 2022-04-21 NOTE — Progress Notes (Signed)
[facility] CSW Progress Note  Holiday representative met with patient to provide support during first infusion. Patient is doing well today. Decreased stress as the move has been completed and she has had help with unpacking. She is receiving significant support from family and friends and plans to stay with her mom after treatment today. Pt was able to go for a walk this week and has done some reading and devotionals. She plans to try to attend yoga next week.  No other needs at this time.    Nishanth Mccaughan E Rumaldo Difatta, LCSW

## 2022-04-24 ENCOUNTER — Telehealth: Payer: Self-pay | Admitting: *Deleted

## 2022-04-24 ENCOUNTER — Inpatient Hospital Stay: Payer: BC Managed Care – PPO

## 2022-04-24 ENCOUNTER — Encounter: Payer: Self-pay | Admitting: Hematology and Oncology

## 2022-04-24 NOTE — Progress Notes (Signed)
Pt was approved for [employer] from 04/20/22 to 04/20/23 for up to $25,000.  The program reduces pt's copay responsibility to $0.

## 2022-04-24 NOTE — Telephone Encounter (Signed)
Called pt to see how she did with her recent treatment.  She reports doing well & worked at home today.  She reports some nausea but hasn't taken anything.  Encouraged to take either compazine or zofran now to help with this.  She reports fever 100.5 after treatment & called on-call RN & was told to take tylenol which she is allergic to but took with benadryl & fever decreased.  She denies any other signs of infection.  She did not take her decadron before or after treatment b/c she thought that was only if she did chemo.  Instructed to call & talk with Merleen Nicely RN tomorrow to verify for next treatment.

## 2022-04-24 NOTE — Telephone Encounter (Signed)
-----   Message from Evalee Jefferson, RN sent at 04/21/2022  2:31 PM EDT ----- Regarding: First Time Pertuzumab and Trastuzumab-dkst ([place]) Pt tolerated infusion well.

## 2022-04-24 NOTE — Telephone Encounter (Signed)
Received after hours message from pt regarding fever of 100.5.  RN attempt x1 to contact pt.  No answer, LVM for pt to return call to the office.

## 2022-04-27 ENCOUNTER — Encounter (HOSPITAL_COMMUNITY): Payer: Self-pay | Admitting: Surgery

## 2022-04-27 NOTE — Progress Notes (Signed)
Patient Care Team: Caitlyn Canary, MD as PCP - General (Family Medicine) Caitlyn Kaufmann, RN as Oncology Nurse Navigator Caitlyn Germany, RN as Oncology Nurse Navigator Caitlyn Lose, MD as Consulting Physician (Hematology and Oncology)  DIAGNOSIS:  Encounter Diagnosis  Name Primary?   Malignant neoplasm of upper-outer quadrant of left breast in female, estrogen receptor positive ([place])     SUMMARY OF ONCOLOGIC HISTORY: Oncology History  Malignant neoplasm of upper-outer quadrant of left breast in female, estrogen receptor positive ([place])  03/14/2022 Initial Diagnosis   Screening detected left breast masses 1.5 cm and 1.3 cm with enlarged left axillary lymph node, biopsy revealed grade 3 IDC with lymph node being positive, ER 95%, PR 0%, HER2 2+ by IHC and FISH positive with a ratio 2.45 and a copy #4.9, the second biopsy was HER2 negative with a ratio 2.15 and a copy #3.55   03/29/2022 Cancer Staging   Staging form: Breast, AJCC 8th Edition - Clinical: Stage IIA (cT1c, cN1, cM0, G3, ER+, PR-, HER2+) - Signed by Caitlyn Lose, MD on 03/29/2022 Stage prefix: Initial diagnosis Histologic grading system: 3 grade system   03/29/2022 -  Anti-estrogen oral therapy   Letrozole daily   04/21/2022 -  Chemotherapy   Herceptin/Perjeta every 21 days initially x 3, will reassess with MRI and then will decide whether to continue or to add chemo.  (Original recommendation was TCHP x 6)       CHIEF COMPLIANT: Follow-up cycle 2 Herceptin Perjeta   INTERVAL HISTORY: Caitlyn Torres is a  a 61 y.o. female is here because of recent diagnosis of left breast cancer. She presents to the clinic today for a follow-up. States that she had a fever but she took some benadryl. States that she had some mild fatigue. States 3rd day she had some sever diarrhea. States that she didn't take anything for it. States that a week in a half ago she noticed mouth sores. States that it was causing her problems to eat.  She states that she has had a lot of stress from trying to move and getting her house situated. States that she had some nausea for at leas 5 or 6 days. States that she is tolerating the letrozole.   ALLERGIES:  is allergic to aleve [naproxen sodium], aspirin, macrobid [nitrofurantoin], motrin [ibuprofen], and tylenol [acetaminophen].  MEDICATIONS:  Current Outpatient Medications  Medication Sig Dispense Refill   carboxymethylcellulose (REFRESH PLUS) 0.5 % SOLN 1 drop 3 (three) times daily as needed (irritation).     cetirizine (ZYRTEC) 10 MG tablet Take 10 mg by mouth daily.     dexamethasone (DECADRON) 4 MG tablet Take 1 tablet (4 mg total) by mouth daily. Take 1 tablet day before chemo and 1 tablet day after chemo with food (Patient not taking: Reported on 04/11/2022) 12 tablet 0   EPINEPHrine 0.3 mg/0.3 mL IJ SOAJ injection Inject 0.3 mg into the muscle as needed for anaphylaxis. Prn     letrozole (FEMARA) 2.5 MG tablet Take 1 tablet (2.5 mg total) by mouth daily. 90 tablet 3   lidocaine-prilocaine (EMLA) cream Apply to affected area once (Patient not taking: Reported on 04/11/2022) 30 g 3   magic mouthwash w/lidocaine SOLN Take 5 mLs by mouth 4 (four) times daily as needed for mouth pain. 240 mL 1   olopatadine (PATANOL) 0.1 % ophthalmic solution Place 1 drop into both eyes daily as needed for allergies.     ondansetron (ZOFRAN) 8 MG tablet Take 1  tablet (8 mg total) by mouth 2 (two) times daily as needed (Nausea or vomiting). Start on the third day after chemotherapy. (Patient not taking: Reported on 04/11/2022) 30 tablet 1   prochlorperazine (COMPAZINE) 10 MG tablet Take 1 tablet (10 mg total) by mouth every 6 (six) hours as needed (Nausea or vomiting). (Patient not taking: Reported on 04/11/2022) 30 tablet 1   No current facility-administered medications for this visit.    PHYSICAL EXAMINATION: ECOG PERFORMANCE STATUS: 1 - Symptomatic but completely ambulatory  Vitals:   05/11/22 1052   BP: 136/68  Pulse: (!) 57  Resp: 17  Temp: (!) 97.5 F (36.4 C)  SpO2: 100%   Filed Weights   05/11/22 1052  Weight: 118 lb 4.8 oz (53.7 kg)     LABORATORY DATA:  I have reviewed the data as listed    Latest Ref Rng & Units 05/11/2022   10:38 AM 04/28/2022   10:34 AM 04/21/2022    9:07 AM  CMP  Glucose 70 - 99 mg/dL 106  97  121   BUN 6 - 20 mg/dL _0 Creatinine 0.44 - 1.00 mg/dL 0.63  0.57  0.72   Sodium 135 - 145 mmol/L 137  135  138   Potassium 3.5 - 5.1 mmol/L 4.0  4.2  3.9   Chloride 98 - 111 mmol/L 103  102  103   CO2 22 - 32 mmol/L _1 Calcium 8.9 - 10.3 mg/dL 9.8  9.8  9.7   Total Protein 6.5 - 8.1 g/dL 6.4  7.2  6.8   Total Bilirubin 0.3 - 1.2 mg/dL 0.6  0.5  0.5   Alkaline Phos 38 - 126 U/L 48  52  44   AST 15 - 41 U/L _2 ALT 0 - 44 U/L _3 Lab Results  Component Value Date   WBC 2.8 (L) 05/11/2022   HGB 13.5 05/11/2022   HCT 38.8 05/11/2022   MCV 85.1 05/11/2022   PLT 151 05/11/2022   NEUTROABS 1.1 (L) 05/11/2022    ASSESSMENT & PLAN:  Malignant neoplasm of upper-outer quadrant of left breast in female, estrogen receptor positive ([place]) 03/14/2022:Screening detected left breast masses 1.5 cm and 1.3 cm with enlarged left axillary lymph node, biopsy revealed grade 3 IDC with lymph node being positive, ER 95%, PR 0%, HER2 2+ by IHC and FISH positive with a ratio 2.45 and a copy #4.9, the second biopsy was HER2 negative with a ratio 2.15 and a copy #3.55   Treatment plan based on multidisciplinary tumor board: 1. Neoadjuvant chemotherapy with Herceptin Perjeta and letrozole for 3 cycles.  Reassess with another MRI and then decide if she can continue with the same plan or do we need to add chemotherapy. (Our original recommendation is TCHP x6 cycles but patient is not willing to go through chemo right away) 2. Followed by breast conserving surgery if possible with sentinel lymph node study 3. Followed by adjuvant radiation  therapy if patient had lumpectomy 4.  Adjuvant antiestrogen therapy with neratinib -------------------------------------------------------------------------------------------------------------------------- Current Treatment: Cycle 2 Herceptin and Perjeta CT CAP and Bone Scan: 04/16/22: No distant mets Toxicities: Severe diarrhea for 1 day: She will start taking Imodium if needed in the future. Nausea: Takes Compazine Fatigue  Leukopenia: I do not believe it is a side effect of Herceptin and Perjeta.  She does take a lot  of natural herbal supplements and I encouraged her to look at each and every one of them and determine if any of those could be causing her leukopenia. For today it is fine to treat with an ANC of 1.1.  RTC in 3 weeks for cycle 3 We will plan to do mammogram/ultrasound after 3 cycles.   No orders of the defined types were placed in this encounter.  The patient has a good understanding of the overall plan. she agrees with it. she will call with any problems that may develop before the next visit here. Total time spent: 30 mins including face to face time and time spent for planning, charting and co-ordination of care   Harriette Ohara, MD 05/11/22    I Gardiner Coins am scribing for Dr. Lindi Adie  I have reviewed the above documentation for accuracy and completeness, and I agree with the above.

## 2022-04-28 ENCOUNTER — Inpatient Hospital Stay: Payer: BC Managed Care – PPO | Admitting: Adult Health

## 2022-04-28 ENCOUNTER — Other Ambulatory Visit: Payer: Self-pay

## 2022-04-28 ENCOUNTER — Inpatient Hospital Stay: Payer: BC Managed Care – PPO

## 2022-04-28 ENCOUNTER — Encounter: Payer: Self-pay | Admitting: Adult Health

## 2022-04-28 VITALS — BP 147/71 | HR 66 | Temp 97.6°F | Resp 18 | Ht 63.0 in | Wt 118.8 lb

## 2022-04-28 DIAGNOSIS — Z170 Estrogen receptor positive status [ER+]: Secondary | ICD-10-CM | POA: Diagnosis not present

## 2022-04-28 DIAGNOSIS — C50412 Malignant neoplasm of upper-outer quadrant of left female breast: Secondary | ICD-10-CM

## 2022-04-28 DIAGNOSIS — Z95828 Presence of other vascular implants and grafts: Secondary | ICD-10-CM | POA: Insufficient documentation

## 2022-04-28 LAB — COMPREHENSIVE METABOLIC PANEL
ALT: 21 U/L (ref 0–44)
AST: 23 U/L (ref 15–41)
Albumin: 4.6 g/dL (ref 3.5–5.0)
Alkaline Phosphatase: 52 U/L (ref 38–126)
Anion gap: 5 (ref 5–15)
BUN: 7 mg/dL (ref 6–20)
CO2: 28 mmol/L (ref 22–32)
Calcium: 9.8 mg/dL (ref 8.9–10.3)
Chloride: 102 mmol/L (ref 98–111)
Creatinine, Ser: 0.57 mg/dL (ref 0.44–1.00)
GFR, Estimated: >60 mL/min (ref >60)
Glucose, Bld: 97 mg/dL (ref 70–99)
Potassium: 4.2 mmol/L (ref 3.5–5.1)
Sodium: 135 mmol/L (ref 135–145)
Total Bilirubin: 0.5 mg/dL (ref 0.3–1.2)
Total Protein: 7.2 g/dL (ref 6.5–8.1)

## 2022-04-28 LAB — CBC WITH DIFFERENTIAL/PLATELET
Abs Immature Granulocytes: 0.02 10*3/uL (ref 0.00–0.07)
Basophils Absolute: 0 10*3/uL (ref 0.0–0.1)
Basophils Relative: 1 %
Eosinophils Absolute: 0.1 10*3/uL (ref 0.0–0.5)
Eosinophils Relative: 3 %
HCT: 40 % (ref 36.0–46.0)
Hemoglobin: 14.1 g/dL (ref 12.0–15.0)
Immature Granulocytes: 1 %
Lymphocytes Relative: 33 %
Lymphs Abs: 1.1 10*3/uL (ref 0.7–4.0)
MCH: 29.6 pg (ref 26.0–34.0)
MCHC: 35.3 g/dL (ref 30.0–36.0)
MCV: 83.9 fL (ref 80.0–100.0)
Monocytes Absolute: 0.3 10*3/uL (ref 0.1–1.0)
Monocytes Relative: 9 %
Neutro Abs: 1.8 10*3/uL (ref 1.7–7.7)
Neutrophils Relative %: 53 %
Platelets: 161 10*3/uL (ref 150–400)
RBC: 4.77 MIL/uL (ref 3.87–5.11)
RDW: 14.1 % (ref 11.5–15.5)
WBC: 3.4 10*3/uL — ABNORMAL LOW (ref 4.0–10.5)
nRBC: 0 % (ref 0.0–0.2)

## 2022-04-28 MED ORDER — LETROZOLE 2.5 MG PO TABS: 2.5000 mg | ORAL_TABLET | Freq: Every day | ORAL | 3 refills | Status: DC

## 2022-04-28 MED ORDER — SODIUM CHLORIDE 0.9% FLUSH
10.0000 mL | Freq: Once | INTRAVENOUS | Status: AC
  Administered 2022-04-28: 10 mL

## 2022-04-28 MED ORDER — HEPARIN SOD (PORK) LOCK FLUSH 100 UNIT/ML IV SOLN
500.0000 [IU] | Freq: Once | INTRAVENOUS | Status: AC
  Administered 2022-04-28: 500 [IU]

## 2022-04-28 NOTE — Assessment & Plan Note (Signed)
Caitlyn Torres is a 61 year old woman with stage IIa estrogen and HER2 positive breast cancer currently undergoing neoadjuvant treatment with Herceptin, Perjeta, and letrozole.  She tolerated this therapy well.  Her labs today are stable and I reviewed those with her in detail.  She will continue on the letrozole daily and will return in 2 weeks for Herceptin Perjeta.  I reviewed her medications with her and she is not taking dexamethasone which is strictly for her chemo so she would not start that unless she starts chemotherapy.  The working plan is for her to receive 3 cycles of therapy and then undergo restaging with breast MRI to determine whether the addition of Taxotere and carboplatin are necessary.

## 2022-04-28 NOTE — Progress Notes (Signed)
[facility] Follow up:    Caitlyn Canary, MD [place] [address]   DIAGNOSIS:  Cancer Staging  Malignant neoplasm of upper-outer quadrant of left breast in female, estrogen receptor positive ([place]) Staging form: Breast, AJCC 8th Edition - Clinical: Stage IIA (cT1c, cN1, cM0, G3, ER+, PR-, HER2+) - Signed by Nicholas Lose, MD on 03/29/2022 Stage prefix: Initial diagnosis Histologic grading system: 3 grade system   SUMMARY OF ONCOLOGIC HISTORY: Oncology History  Malignant neoplasm of upper-outer quadrant of left breast in female, estrogen receptor positive ([place])  03/14/2022 Initial Diagnosis   Screening detected left breast masses 1.5 cm and 1.3 cm with enlarged left axillary lymph node, biopsy revealed grade 3 IDC with lymph node being positive, ER 95%, PR 0%, HER2 2+ by IHC and FISH positive with a ratio 2.45 and a copy #4.9, the second biopsy was HER2 negative with a ratio 2.15 and a copy #3.55   03/29/2022 Cancer Staging   Staging form: Breast, AJCC 8th Edition - Clinical: Stage IIA (cT1c, cN1, cM0, G3, ER+, PR-, HER2+) - Signed by Nicholas Lose, MD on 03/29/2022 Stage prefix: Initial diagnosis Histologic grading system: 3 grade system   03/29/2022 -  Anti-estrogen oral therapy   Letrozole daily   04/21/2022 -  Chemotherapy   Herceptin/Perjeta every 21 days initially x 3, will reassess with MRI and then will decide whether to continue or to add chemo.  (Original recommendation was TCHP x 6)       CURRENT THERAPY: Herceptin Perjeta cycle 1 day 8; letrozole daily  INTERVAL HISTORY: Caitlyn Torres 61 y.o. female returns forfollow-up after receiving her first cycle of Herceptin and Perjeta.  She is tolerating this treatment well.  On day 3 following the infusion she did develop some diarrhea this happened overnight but then resolved.  She did not need to take any Imodium and she has been hydrating herself well.  She had some mild  nausea and took Compazine for it but otherwise denies any side effects from this medication.  Her most recent echocardiogram was completed on Apr 06, 2022 and showed a normal ejection fraction of 55 to 60% and normal global longitudinal strain.  She felt her breast nodule the other day and said that it felt the same to may be slightly better.  She also continues on letrozole daily.  She is tolerating this well.  Patient Active Problem List   Diagnosis Date Noted   Port-A-Cath in place 04/28/2022   Malignant neoplasm of upper-outer quadrant of left breast in female, estrogen receptor positive ([place]) 03/29/2022    is allergic to aleve [naproxen sodium], aspirin, macrobid [nitrofurantoin], motrin [ibuprofen], and tylenol [acetaminophen].  MEDICAL HISTORY: Past Medical History:  Diagnosis Date   Cancer [facility])     SURGICAL HISTORY: Past Surgical History:  Procedure Laterality Date   CERVIX SURGERY     Laser removal of pre-cancer cells   LASIK Bilateral    PORTACATH PLACEMENT Right 04/20/2022   Procedure: PORT PLACEMENT;  Surgeon: Erroll Luna, MD;  Location: [facility];  Service: General;  Laterality: Right;    SOCIAL HISTORY: Social History   Socioeconomic History   Marital status: Married    Spouse name: Not on file   Number of children: 1   Years of education: Not on file   Highest education level: Not on file  Occupational History   Not on file  Tobacco Use   Smoking status: Former  Types: Cigarettes   Smokeless tobacco: Never  Vaping Use   Vaping Use: Never used  Substance and Sexual Activity   Alcohol use: Not Currently   Drug use: Never   Sexual activity: Not on file  Other Topics Concern   Not on file  Social History Narrative   Not on file   Social Determinants of Health   Financial Resource Strain: Low Risk  (04/04/2022)   Overall Financial Resource Strain (CARDIA)    Difficulty of Paying Living Expenses: Not very hard  Food Insecurity: No Food Insecurity  (04/04/2022)   Hunger Vital Sign    Worried About Running Out of Food in the Last Year: Never true    Ran Out of Food in the Last Year: Never true  Transportation Needs: No Transportation Needs (04/04/2022)   PRAPARE - Hydrologist (Medical): No    Lack of Transportation (Non-Medical): No  Physical Activity: Not on file  Stress: Not on file  Social Connections: Not on file  Intimate Partner Violence: Not At Risk (04/11/2022)   Humiliation, Afraid, Rape, and Kick questionnaire    Fear of Current or Ex-Partner: No    Emotionally Abused: No    Physically Abused: No    Sexually Abused: No    FAMILY HISTORY: Family History  Problem Relation Age of Onset   Prostate cancer Father    Breast cancer Sister 54   Pancreatic cancer Paternal Grandmother    Parkinson's disease Paternal Grandmother    Bladder Cancer Paternal Grandfather     Review of Systems  Constitutional:  Positive for fatigue (Mild). Negative for appetite change, chills, fever and unexpected weight change.  HENT:   Negative for hearing loss, lump/mass and trouble swallowing.   Eyes:  Negative for eye problems and icterus.  Respiratory:  Negative for chest tightness, cough and shortness of breath.   Cardiovascular:  Negative for chest pain, leg swelling and palpitations.  Gastrointestinal:  Positive for diarrhea (Now resolved). Negative for abdominal distention, abdominal pain, constipation, nausea and vomiting.  Endocrine: Negative for hot flashes.  Genitourinary:  Negative for difficulty urinating.   Musculoskeletal:  Negative for arthralgias.  Skin:  Negative for itching and rash.  Neurological:  Negative for dizziness, extremity weakness, headaches and numbness.  Hematological:  Negative for adenopathy. Does not bruise/bleed easily.  Psychiatric/Behavioral:  Negative for depression. The patient is not nervous/anxious.       PHYSICAL EXAMINATION  ECOG PERFORMANCE STATUS: 1 - Symptomatic  but completely ambulatory  Vitals:   04/28/22 1055  BP: (!) 147/71  Pulse: 66  Resp: 18  Temp: 97.6 F (36.4 C)  SpO2: 100%    Physical Exam Constitutional:      General: She is not in acute distress.    Appearance: Normal appearance. She is not toxic-appearing.  HENT:     Head: Normocephalic and atraumatic.  Eyes:     General: No scleral icterus. Cardiovascular:     Rate and Rhythm: Normal rate and regular rhythm.     Pulses: Normal pulses.     Heart sounds: Normal heart sounds.  Pulmonary:     Effort: Pulmonary effort is normal.     Breath sounds: Normal breath sounds.  Abdominal:     General: Abdomen is flat. Bowel sounds are normal. There is no distension.     Palpations: Abdomen is soft.     Tenderness: There is no abdominal tenderness.  Musculoskeletal:        General: No  swelling.     Cervical back: Neck supple.  Lymphadenopathy:     Cervical: No cervical adenopathy.  Skin:    General: Skin is warm and dry.     Findings: No rash.  Neurological:     General: No focal deficit present.     Mental Status: She is alert.  Psychiatric:        Mood and Affect: Mood normal.        Behavior: Behavior normal.     LABORATORY DATA:  CBC    Component Value Date/Time   WBC 3.4 (L) 04/28/2022 1034   RBC 4.77 04/28/2022 1034   HGB 14.1 04/28/2022 1034   HCT 40.0 04/28/2022 1034   PLT 161 04/28/2022 1034   MCV 83.9 04/28/2022 1034   MCH 29.6 04/28/2022 1034   MCHC 35.3 04/28/2022 1034   RDW 14.1 04/28/2022 1034   LYMPHSABS 1.1 04/28/2022 1034   MONOABS 0.3 04/28/2022 1034   EOSABS 0.1 04/28/2022 1034   BASOSABS 0.0 04/28/2022 1034    CMP     Component Value Date/Time   NA 135 04/28/2022 1034   K 4.2 04/28/2022 1034   CL 102 04/28/2022 1034   CO2 28 04/28/2022 1034   GLUCOSE 97 04/28/2022 1034   BUN 7 04/28/2022 1034   CREATININE 0.57 04/28/2022 1034   CALCIUM 9.8 04/28/2022 1034   PROT 7.2 04/28/2022 1034   ALBUMIN 4.6 04/28/2022 1034   AST 23  04/28/2022 1034   ALT 21 04/28/2022 1034   ALKPHOS 52 04/28/2022 1034   BILITOT 0.5 04/28/2022 1034   GFRNONAA >60 04/28/2022 1034   GFRAA  03/07/2011 1741    >60        The eGFR has been calculated using the MDRD equation. This calculation has not been validated in all clinical situations. eGFR's persistently <60 mL/min signify possible Chronic Kidney Disease.      ASSESSMENT and THERAPY PLAN:   Malignant neoplasm of upper-outer quadrant of left breast in female, estrogen receptor positive ([place]) Caitlyn Torres is a 61 year old woman with stage IIa estrogen and HER2 positive breast cancer currently undergoing neoadjuvant treatment with Herceptin, Perjeta, and letrozole.  She tolerated this therapy well.  Her labs today are stable and I reviewed those with her in detail.  She will continue on the letrozole daily and will return in 2 weeks for Herceptin Perjeta.  I reviewed her medications with her and she is not taking dexamethasone which is strictly for her chemo so she would not start that unless she starts chemotherapy.  The working plan is for her to receive 3 cycles of therapy and then undergo restaging with breast MRI to determine whether the addition of Taxotere and carboplatin are necessary.   All questions were answered. The patient knows to call the clinic with any problems, questions or concerns. We can certainly see the patient much sooner if necessary.  Total encounter time:30 minutes*in face-to-face visit time, chart review, lab review, care coordination, order entry, and documentation of the encounter time.    Wilber Bihari, NP 04/28/22 11:39 AM Medical Oncology and Hematology [facility] [place], [place] [address] Tel. [phone]    Fax. [phone]  *Total Encounter Time as defined by the Centers for Medicare and Medicaid Services includes, in addition to the face-to-face time of a patient visit (documented in the note above)  non-face-to-face time: obtaining and reviewing outside history, ordering and reviewing medications, tests or procedures, care coordination (communications with other health care  professionals or caregivers) and documentation in the medical record.

## 2022-04-28 NOTE — Addendum Note (Signed)
Addended by: Wilber Bihari C on: 04/28/2022 11:40 AM   Modules accepted: Orders

## 2022-05-01 ENCOUNTER — Encounter: Payer: Self-pay | Admitting: *Deleted

## 2022-05-09 ENCOUNTER — Other Ambulatory Visit: Payer: Self-pay | Admitting: *Deleted

## 2022-05-09 MED ORDER — MAGIC MOUTHWASH W/LIDOCAINE: 5.0000 mL | Freq: Four times a day (QID) | ORAL | 1 refills | Status: DC | PRN

## 2022-05-10 NOTE — Assessment & Plan Note (Signed)
03/14/2022:Screening detected left breast masses 1.5 cm and 1.3 cm with enlarged left axillary lymph node, biopsy revealed grade 3 IDC with lymph node being positive, ER 95%, PR 0%, HER2 2+ by IHC and FISH positive with a ratio 2.45 and a copy #4.9, the second biopsy was HER2 negative with a ratio 2.15 and a copy #3.55  Treatment plan based on multidisciplinary tumor board: 1. Neoadjuvant chemotherapy with Herceptin Perjeta and letrozole for 3 cycles.  Reassess with another MRI and then decide if she can continue with the same plan or do we need to add chemotherapy. (Our original recommendation is TCHP x6 cycles but patient is not willing to go through chemo right away) 2. Followed by breast conserving surgery if possible with sentinel lymph node study 3. Followed by adjuvant radiation therapy if patient had lumpectomy 4.  Adjuvant antiestrogen therapy with neratinib -------------------------------------------------------------------------------------------------------------------------- Current Treatment: Cycle 2 Herceptin and Perjeta CT CAP and Bone Scan: 04/16/22: No distant mets Toxicities:  RTC in 3 weeks for cycle 3

## 2022-05-11 ENCOUNTER — Other Ambulatory Visit: Payer: Self-pay

## 2022-05-11 ENCOUNTER — Inpatient Hospital Stay (HOSPITAL_BASED_OUTPATIENT_CLINIC_OR_DEPARTMENT_OTHER): Payer: BC Managed Care – PPO | Admitting: Genetic Counselor

## 2022-05-11 ENCOUNTER — Inpatient Hospital Stay: Payer: BC Managed Care – PPO

## 2022-05-11 ENCOUNTER — Inpatient Hospital Stay (HOSPITAL_BASED_OUTPATIENT_CLINIC_OR_DEPARTMENT_OTHER): Payer: BC Managed Care – PPO | Admitting: Hematology and Oncology

## 2022-05-11 ENCOUNTER — Other Ambulatory Visit: Payer: Self-pay | Admitting: Genetic Counselor

## 2022-05-11 ENCOUNTER — Encounter: Payer: BC Managed Care – PPO | Admitting: Genetic Counselor

## 2022-05-11 VITALS — BP 117/50 | HR 60 | Resp 16

## 2022-05-11 DIAGNOSIS — Z170 Estrogen receptor positive status [ER+]: Secondary | ICD-10-CM

## 2022-05-11 DIAGNOSIS — Z8042 Family history of malignant neoplasm of prostate: Secondary | ICD-10-CM | POA: Diagnosis not present

## 2022-05-11 DIAGNOSIS — C50412 Malignant neoplasm of upper-outer quadrant of left female breast: Secondary | ICD-10-CM

## 2022-05-11 DIAGNOSIS — Z800 Family history of malignant neoplasm of digestive organs: Secondary | ICD-10-CM | POA: Diagnosis not present

## 2022-05-11 DIAGNOSIS — Z803 Family history of malignant neoplasm of breast: Secondary | ICD-10-CM | POA: Diagnosis not present

## 2022-05-11 DIAGNOSIS — Z95828 Presence of other vascular implants and grafts: Secondary | ICD-10-CM

## 2022-05-11 LAB — CBC WITH DIFFERENTIAL/PLATELET
Abs Immature Granulocytes: 0 10*3/uL (ref 0.00–0.07)
Basophils Absolute: 0 10*3/uL (ref 0.0–0.1)
Basophils Relative: 1 %
Eosinophils Absolute: 0.1 10*3/uL (ref 0.0–0.5)
Eosinophils Relative: 5 %
HCT: 38.8 % (ref 36.0–46.0)
Hemoglobin: 13.5 g/dL (ref 12.0–15.0)
Immature Granulocytes: 0 %
Lymphocytes Relative: 44 %
Lymphs Abs: 1.2 10*3/uL (ref 0.7–4.0)
MCH: 29.6 pg (ref 26.0–34.0)
MCHC: 34.8 g/dL (ref 30.0–36.0)
MCV: 85.1 fL (ref 80.0–100.0)
Monocytes Absolute: 0.3 10*3/uL (ref 0.1–1.0)
Monocytes Relative: 11 %
Neutro Abs: 1.1 10*3/uL — ABNORMAL LOW (ref 1.7–7.7)
Neutrophils Relative %: 39 %
Platelets: 151 10*3/uL (ref 150–400)
RBC: 4.56 MIL/uL (ref 3.87–5.11)
RDW: 14.4 % (ref 11.5–15.5)
WBC: 2.8 10*3/uL — ABNORMAL LOW (ref 4.0–10.5)
nRBC: 0 % (ref 0.0–0.2)

## 2022-05-11 LAB — GENETIC SCREENING ORDER

## 2022-05-11 LAB — COMPREHENSIVE METABOLIC PANEL
ALT: 23 U/L (ref 0–44)
AST: 25 U/L (ref 15–41)
Albumin: 4.3 g/dL (ref 3.5–5.0)
Alkaline Phosphatase: 48 U/L (ref 38–126)
Anion gap: 5 (ref 5–15)
BUN: 7 mg/dL (ref 6–20)
CO2: 29 mmol/L (ref 22–32)
Calcium: 9.8 mg/dL (ref 8.9–10.3)
Chloride: 103 mmol/L (ref 98–111)
Creatinine, Ser: 0.63 mg/dL (ref 0.44–1.00)
GFR, Estimated: >60 mL/min (ref >60)
Glucose, Bld: 106 mg/dL — ABNORMAL HIGH (ref 70–99)
Potassium: 4 mmol/L (ref 3.5–5.1)
Sodium: 137 mmol/L (ref 135–145)
Total Bilirubin: 0.6 mg/dL (ref 0.3–1.2)
Total Protein: 6.4 g/dL — ABNORMAL LOW (ref 6.5–8.1)

## 2022-05-11 MED ORDER — TRASTUZUMAB-DKST CHEMO 150 MG IV SOLR
6.0000 mg/kg | Freq: Once | INTRAVENOUS | Status: AC
  Administered 2022-05-11: 336 mg via INTRAVENOUS
  Filled 2022-05-11: qty 16

## 2022-05-11 MED ORDER — SODIUM CHLORIDE 0.9% FLUSH
10.0000 mL | INTRAVENOUS | Status: DC | PRN
  Administered 2022-05-11: 10 mL

## 2022-05-11 MED ORDER — SODIUM CHLORIDE 0.9% FLUSH
10.0000 mL | Freq: Once | INTRAVENOUS | Status: AC
  Administered 2022-05-11: 10 mL

## 2022-05-11 MED ORDER — SODIUM CHLORIDE 0.9 % IV SOLN
420.0000 mg | Freq: Once | INTRAVENOUS | Status: AC
  Administered 2022-05-11: 420 mg via INTRAVENOUS
  Filled 2022-05-11: qty 14

## 2022-05-11 MED ORDER — HEPARIN SOD (PORK) LOCK FLUSH 100 UNIT/ML IV SOLN
500.0000 [IU] | Freq: Once | INTRAVENOUS | Status: AC | PRN
  Administered 2022-05-11: 500 [IU]

## 2022-05-11 MED ORDER — DIPHENHYDRAMINE HCL 25 MG PO CAPS
50.0000 mg | ORAL_CAPSULE | Freq: Once | ORAL | Status: AC
  Administered 2022-05-11: 50 mg via ORAL
  Filled 2022-05-11 (×2): qty 2

## 2022-05-11 MED ORDER — SODIUM CHLORIDE 0.9 % IV SOLN: Freq: Once | INTRAVENOUS | Status: AC

## 2022-05-11 NOTE — Progress Notes (Signed)
Per Dr. Lindi Adie OK to trt w/ ANC 1.1 today

## 2022-05-11 NOTE — Progress Notes (Signed)
REFERRING PROVIDER: Nicholas Lose, MD [place],  [place] [address]  PRIMARY PROVIDER:  Bradd Canary, MD  PRIMARY REASON FOR VISIT:  Encounter Diagnoses  Name Primary?   Malignant neoplasm of upper-outer quadrant of left breast in female, estrogen receptor positive ([facility]) Yes   Family history of breast cancer    Family history of pancreatic cancer    Family history of prostate cancer     HISTORY OF PRESENT ILLNESS:   Caitlyn Torres, a 61 y.o. female, was seen for a [facility] cancer genetics consultation at the request of Dr. Lindi Torres due to a personal and family history of breast cancer.  Caitlyn Torres presents to clinic today to discuss the possibility of a hereditary predisposition to cancer, to discuss genetic testing, and to further clarify her future cancer risks, as well as potential cancer risks for family members.   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 Current treatment includes neoadjuvant chemotherapy and anti-estrogens.    CANCER HISTORY:  Oncology History  Malignant neoplasm of upper-outer quadrant of left breast in female, estrogen receptor positive ([facility] [place])  03/14/2022 Initial Diagnosis   Screening detected left breast masses 1.5 cm and 1.3 cm with enlarged left axillary lymph node, biopsy revealed grade 3 IDC with lymph node being positive, ER 95%, PR 0%, HER2 2+ by IHC and FISH positive with a ratio 2.45 and a copy #4.9, the second biopsy was HER2 negative with a ratio 2.15 and a copy #3.55   03/29/2022 Cancer Staging   Staging form: Breast, AJCC 8th Edition - Clinical: Stage IIA (cT1c, cN1, cM0, G3, ER+, PR-, HER2+) - Signed by Nicholas Lose, MD on 03/29/2022 Stage prefix: Initial diagnosis Histologic grading system: 3 grade system   03/29/2022 -  Anti-estrogen oral therapy   Letrozole daily   04/21/2022 -  Chemotherapy   Herceptin/Perjeta every 21 days initially x  3, will reassess with MRI and then will decide whether to continue or to add chemo.  (Original recommendation was TCHP x 6)       RISK FACTORS:  Colonoscopy: yes;  more than 10 years ago; Cologuard normal . Hysterectomy: no.  Ovaries intact: yes.  Menarche was at age 75 or 89.  First live birth at age 89.  Menopausal status: postmenopausal.  OCP use for approximately  over 30  years.  HRT use: around 1 month Any excessive radiation exposure in the past: no Dermatology: as needed  Past Medical History:  Diagnosis Date   Cancer [facility])     Past Surgical History:  Procedure Laterality Date   CERVIX SURGERY     Laser removal of pre-cancer cells   LASIK Bilateral    PORTACATH PLACEMENT Right 04/20/2022   Procedure: PORT PLACEMENT;  Surgeon: Erroll Luna, MD;  Location: [facility];  Service: General;  Laterality: Right;    FAMILY HISTORY:  We obtained a detailed, 4-generation family history.  Significant diagnoses are listed below:  Family History  Problem Relation Age of Onset   Prostate cancer Father        dx after 18   Breast cancer Sister 52       neg GT   Prostate cancer Maternal Grandfather        d. late 22s   Pancreatic cancer Paternal Grandmother        d. 53s   Parkinson's disease Paternal Grandmother    Bladder Cancer Paternal Grandfather  dx after 64   Breast cancer Cousin        paternal female cousin; dx 13s     Caitlyn Torres's sister had negative hereditary cancer genetic testing in 2017 for 24 genes through [facility]. Caitlyn Torres is unaware of other previous family history of genetic testing for hereditary cancer risks. There is no reported Ashkenazi Jewish ancestry. There is no known consanguinity.  GENETIC COUNSELING ASSESSMENT: Ms. Pala is a 61 y.o. female with a personal and family history of cancer which is somewhat suggestive of a hereditary cancer syndrome and predisposition to cancer given the presence of related cancers in the family (breast,  prostate, pancreatic) and the ages of diagnosis. We, therefore, discussed and recommended the following at today's visit.   DISCUSSION: We discussed that 5 - 10% of cancer is hereditary.  Most cases of hereditary breast, prostate, and pancreatic cancers are associated with mutations in BRCA1/2.  There are other genes that can be associated with hereditary breast cancer syndromes.  We discussed that testing is beneficial for several reasons including knowing how to follow individuals for their cancer risks, identifying whether potential treatment options would be beneficial, and understanding if other family members could be at risk for cancer and allowing them to undergo genetic testing.   We reviewed the characteristics, features and inheritance patterns of hereditary cancer syndromes. We also discussed genetic testing, including the appropriate family members to test, the process of testing, insurance coverage and turn-around-time for results. We discussed the implications of a negative, positive, carrier and/or variant of uncertain significant result. We recommended Caitlyn Torres pursue genetic testing for a panel that includes genes associated with breast, pancreatic, prostate and other cancers.   The CancerNext-Expanded gene panel offered by [facility] and includes sequencing, rearrangement, and RNA analysis for the following 77 genes: AIP, ALK, APC, ATM, AXIN2, BAP1, BARD1, BLM, BMPR1A, BRCA1, BRCA2, BRIP1, CDC73, CDH1, CDK4, CDKN1B, CDKN2A, CHEK2, CTNNA1, DICER1, FANCC, FH, FLCN, GALNT12, KIF1B, LZTR1, MAX, MEN1, MET, MLH1, MSH2, MSH3, MSH6, MUTYH, NBN, NF1, NF2, NTHL1, PALB2, PHOX2B, PMS2, POT1, PRKAR1A, PTCH1, PTEN, RAD51C, RAD51D, RB1, RECQL, RET, SDHA, SDHAF2, SDHB, SDHC, SDHD, SMAD4, SMARCA4, SMARCB1, SMARCE1, STK11, SUFU, TMEM127, TP53, TSC1, TSC2, VHL and XRCC2 (sequencing and deletion/duplication); EGFR, EGLN1, HOXB13, KIT, MITF, PDGFRA, POLD1, and POLE (sequencing only); EPCAM and GREM1  (deletion/duplication only).   Based on Caitlyn Torres's personal and family history of cancer, she meets medical criteria for genetic testing. Despite that she meets criteria, she may still have an out of pocket cost. We discussed that if her out of pocket cost for testing is over $100, the laboratory should contact her and discuss the self-pay prices and/or patient pay assistance programs.    PLAN: After considering the risks, benefits, and limitations, Ms. Ortlieb provided informed consent to pursue genetic testing and the blood sample was sent to [facility] for analysis of the CancerNext-Expanded +RNAinsight Panel. Results should be available within approximately 3 weeks' time, at which point they will be disclosed by telephone to Ms. Kuan, as will any additional recommendations warranted by these results. Ms. Murphey will receive a summary of her genetic counseling visit and a copy of her results once available. This information will also be available in Epic.   Ms. Neiswender questions were answered to her satisfaction today. Our contact information was provided should additional questions or concerns arise. Thank you for the referral and allowing [place] to share in the care of your patient.   Jasiyah Paulding M. Joette Catching, [place], [place]  [email or web address] (P) [phone]  The patient was seen for a total of 30 minutes in face-to-face genetic counseling.  Patient was accompanied by her husband, Lanny Hurst.  Drs. Caitlyn Torres and/or Burr Medico were available to discuss this case as needed.   _______________________________________________________________________ For Office Staff:  Number of people involved in session: 2 Was an Intern/ student involved with case: no

## 2022-05-11 NOTE — Patient Instructions (Signed)
[facility]COL[facility]  Discharge Instructions: Thank you for choosing [facility] to provide your oncology and hematology care.   If you have a lab appointment with the [facility], please go directly to the [facility] and check in at the registration area.   Wear comfortable clothing and clothing appropriate for easy access to any Portacath or PICC line.   We strive to give you quality time with your provider. You may need to reschedule your appointment if you arrive late (15 or more minutes).  Arriving late affects you and other patients whose appointments are after yours.  Also, if you miss three or more appointments without notifying the office, you may be dismissed from the clinic at the provider's discretion.      For prescription refill requests, have your pharmacy contact our office and allow 72 hours for refills to be completed.    Today you received the following chemotherapy and/or immunotherapy agents: Og[place]ri/Perjeta      To help prevent nausea and vomiting after your treatment, we encourage you to take your nausea medication as directed.  BELOW ARE SYMPTOMS THAT SHOULD BE REPORTED IMMEDIATELY: *FEVER GREATER THAN 100.4 F (38 C) OR HIGHER *CHILLS OR SWEATING *NAUSEA AND VOMITING THAT IS NOT CONTROLLED WITH YOUR NAUSEA MEDICATION *UNUSUAL SHORTNESS OF BREATH *UNUSUAL BRUISING OR BLEEDING *URINARY PROBLEMS (pain or burning when urinating, or frequent urination) *BOWEL PROBLEMS (unusual diarrhea, constipation, pain near the anus) TENDERNESS IN MOUTH AND THROAT WITH OR WITHOUT PRESENCE OF ULCERS (sore throat, sores in mouth, or a toothache) UNUSUAL RASH, SWELLING OR PAIN  UNUSUAL VAGINAL DISCHARGE OR ITCHING   Items with * indicate a potential emergency and should be followed up as soon as possible or go to the Emergency Department if any problems should occur.  Please show the CHEMOTHERAPY ALERT CARD or IMMUNOTHERAPY ALERT CARD at  check-in to the Emergency Department and triage nurse.  Should you have questions after your visit or need to cancel or reschedule your appointment, please contact [facility]  Dept: [phone]  and follow the prompts.  Office hours are 8:00 a.m. to 4:30 p.m. Monday - Friday. Please note that voicemails left after 4:00 p.m. may not be returned until the following business day.  We are closed weekends and major holidays. You have access to a nurse at all times for urgent questions. Please call the main number to the clinic Dept: [phone] and follow the prompts.   For any non-urgent questions, you may also contact your provider using MyChart. We now offer e-Visits for anyone 25 and older to request care online for non-urgent symptoms. For details visit [email or web address].   Also download the MyChart app! Go to the app store, search "MyChart", open the app, select [facility], and log in with your MyChart username and password.  Masks are optional in the cancer centers. If you would like for your care team to wear a mask while they are taking care of you, please let them know. For doctor visits, patients may have with them one support person who is at least 59 years old. At this time, visitors are not allowed in the infusion area.

## 2022-05-13 ENCOUNTER — Inpatient Hospital Stay: Payer: BC Managed Care – PPO

## 2022-05-15 ENCOUNTER — Encounter: Payer: Self-pay | Admitting: Hematology and Oncology

## 2022-05-15 ENCOUNTER — Encounter: Payer: Self-pay | Admitting: Genetic Counselor

## 2022-05-15 ENCOUNTER — Inpatient Hospital Stay: Payer: BC Managed Care – PPO

## 2022-05-15 ENCOUNTER — Encounter: Payer: Self-pay | Admitting: Adult Health

## 2022-05-15 DIAGNOSIS — Z800 Family history of malignant neoplasm of digestive organs: Secondary | ICD-10-CM

## 2022-05-15 DIAGNOSIS — Z803 Family history of malignant neoplasm of breast: Secondary | ICD-10-CM

## 2022-05-15 DIAGNOSIS — Z8042 Family history of malignant neoplasm of prostate: Secondary | ICD-10-CM

## 2022-05-15 HISTORY — DX: Family history of malignant neoplasm of digestive organs: Z80.0

## 2022-05-15 HISTORY — DX: Family history of malignant neoplasm of breast: Z80.3

## 2022-05-15 HISTORY — DX: Family history of malignant neoplasm of prostate: Z80.42

## 2022-06-01 ENCOUNTER — Encounter: Payer: Self-pay | Admitting: *Deleted

## 2022-06-01 ENCOUNTER — Inpatient Hospital Stay (HOSPITAL_BASED_OUTPATIENT_CLINIC_OR_DEPARTMENT_OTHER): Payer: BC Managed Care – PPO

## 2022-06-01 ENCOUNTER — Inpatient Hospital Stay: Payer: BC Managed Care – PPO | Admitting: Hematology and Oncology

## 2022-06-01 ENCOUNTER — Telehealth: Payer: Self-pay | Admitting: Genetic Counselor

## 2022-06-01 ENCOUNTER — Ambulatory Visit: Payer: Self-pay | Admitting: Genetic Counselor

## 2022-06-01 ENCOUNTER — Encounter: Payer: Self-pay | Admitting: Genetic Counselor

## 2022-06-01 ENCOUNTER — Inpatient Hospital Stay: Payer: BC Managed Care – PPO | Attending: Hematology and Oncology

## 2022-06-01 ENCOUNTER — Other Ambulatory Visit: Payer: Self-pay

## 2022-06-01 VITALS — BP 123/73 | HR 59 | Resp 17

## 2022-06-01 DIAGNOSIS — C50412 Malignant neoplasm of upper-outer quadrant of left female breast: Secondary | ICD-10-CM

## 2022-06-01 DIAGNOSIS — Z1379 Encounter for other screening for genetic and chromosomal anomalies: Secondary | ICD-10-CM | POA: Insufficient documentation

## 2022-06-01 DIAGNOSIS — Z170 Estrogen receptor positive status [ER+]: Secondary | ICD-10-CM

## 2022-06-01 DIAGNOSIS — Z79811 Long term (current) use of aromatase inhibitors: Secondary | ICD-10-CM | POA: Diagnosis not present

## 2022-06-01 DIAGNOSIS — Z803 Family history of malignant neoplasm of breast: Secondary | ICD-10-CM

## 2022-06-01 DIAGNOSIS — Z5112 Encounter for antineoplastic immunotherapy: Secondary | ICD-10-CM | POA: Insufficient documentation

## 2022-06-01 DIAGNOSIS — D72819 Decreased white blood cell count, unspecified: Secondary | ICD-10-CM | POA: Diagnosis not present

## 2022-06-01 DIAGNOSIS — Z95828 Presence of other vascular implants and grafts: Secondary | ICD-10-CM

## 2022-06-01 DIAGNOSIS — Z8042 Family history of malignant neoplasm of prostate: Secondary | ICD-10-CM

## 2022-06-01 DIAGNOSIS — Z800 Family history of malignant neoplasm of digestive organs: Secondary | ICD-10-CM

## 2022-06-01 LAB — CBC WITH DIFFERENTIAL/PLATELET
Abs Immature Granulocytes: 0.01 10*3/uL (ref 0.00–0.07)
Basophils Absolute: 0 10*3/uL (ref 0.0–0.1)
Basophils Relative: 1 %
Eosinophils Absolute: 0.2 10*3/uL (ref 0.0–0.5)
Eosinophils Relative: 6 %
HCT: 38.8 % (ref 36.0–46.0)
Hemoglobin: 13.3 g/dL (ref 12.0–15.0)
Immature Granulocytes: 0 %
Lymphocytes Relative: 35 %
Lymphs Abs: 1.1 10*3/uL (ref 0.7–4.0)
MCH: 29.4 pg (ref 26.0–34.0)
MCHC: 34.3 g/dL (ref 30.0–36.0)
MCV: 85.7 fL (ref 80.0–100.0)
Monocytes Absolute: 0.3 10*3/uL (ref 0.1–1.0)
Monocytes Relative: 10 %
Neutro Abs: 1.5 10*3/uL — ABNORMAL LOW (ref 1.7–7.7)
Neutrophils Relative %: 48 %
Platelets: 144 10*3/uL — ABNORMAL LOW (ref 150–400)
RBC: 4.53 MIL/uL (ref 3.87–5.11)
RDW: 14.6 % (ref 11.5–15.5)
WBC: 3.1 10*3/uL — ABNORMAL LOW (ref 4.0–10.5)
nRBC: 0 % (ref 0.0–0.2)

## 2022-06-01 LAB — COMPREHENSIVE METABOLIC PANEL
ALT: 28 U/L (ref 0–44)
AST: 25 U/L (ref 15–41)
Albumin: 4.3 g/dL (ref 3.5–5.0)
Alkaline Phosphatase: 63 U/L (ref 38–126)
Anion gap: 3 — ABNORMAL LOW (ref 5–15)
BUN: 7 mg/dL (ref 6–20)
CO2: 30 mmol/L (ref 22–32)
Calcium: 9.6 mg/dL (ref 8.9–10.3)
Chloride: 105 mmol/L (ref 98–111)
Creatinine, Ser: 0.55 mg/dL (ref 0.44–1.00)
GFR, Estimated: >60 mL/min (ref >60)
Glucose, Bld: 91 mg/dL (ref 70–99)
Potassium: 4.1 mmol/L (ref 3.5–5.1)
Sodium: 138 mmol/L (ref 135–145)
Total Bilirubin: 0.4 mg/dL (ref 0.3–1.2)
Total Protein: 6.5 g/dL (ref 6.5–8.1)

## 2022-06-01 MED ORDER — TRASTUZUMAB-DKST CHEMO 150 MG IV SOLR
6.0000 mg/kg | Freq: Once | INTRAVENOUS | Status: AC
  Administered 2022-06-01: 336 mg via INTRAVENOUS
  Filled 2022-06-01: qty 16

## 2022-06-01 MED ORDER — SODIUM CHLORIDE 0.9 % IV SOLN
420.0000 mg | Freq: Once | INTRAVENOUS | Status: AC
  Administered 2022-06-01: 420 mg via INTRAVENOUS
  Filled 2022-06-01: qty 14

## 2022-06-01 MED ORDER — SODIUM CHLORIDE 0.9% FLUSH
10.0000 mL | INTRAVENOUS | Status: DC | PRN
  Administered 2022-06-01: 10 mL

## 2022-06-01 MED ORDER — SODIUM CHLORIDE 0.9 % IV SOLN: Freq: Once | INTRAVENOUS | Status: AC

## 2022-06-01 MED ORDER — DIPHENHYDRAMINE HCL 25 MG PO CAPS
50.0000 mg | ORAL_CAPSULE | Freq: Once | ORAL | Status: AC
  Administered 2022-06-01: 50 mg via ORAL
  Filled 2022-06-01: qty 2

## 2022-06-01 MED ORDER — SODIUM CHLORIDE 0.9% FLUSH
10.0000 mL | Freq: Once | INTRAVENOUS | Status: AC
  Administered 2022-06-01: 10 mL

## 2022-06-01 MED ORDER — HEPARIN SOD (PORK) LOCK FLUSH 100 UNIT/ML IV SOLN
500.0000 [IU] | Freq: Once | INTRAVENOUS | Status: AC | PRN
  Administered 2022-06-01: 500 [IU]

## 2022-06-01 NOTE — Progress Notes (Signed)
Patient Care Team: Konrad Felix, MD as PCP - General (Family Medicine) Pershing Proud, RN as Oncology Nurse Navigator Donnelly Angelica, RN as Oncology Nurse Navigator [name], MD as Consulting Physician (Hematology and Oncology)  DIAGNOSIS:  Encounter Diagnosis  Name Primary?   Malignant neoplasm of upper-outer quadrant of left breast in female, estrogen receptor positive ([facility])     SUMMARY OF ONCOLOGIC HISTORY: Oncology History  Malignant neoplasm of upper-outer quadrant of left breast in female, estrogen receptor positive ([facility])  03/14/2022 Initial Diagnosis   Screening detected left breast masses 1.5 cm and 1.3 cm with enlarged left axillary lymph node, biopsy revealed grade 3 IDC with lymph node being positive, ER 95%, PR 0%, HER2 2+ by IHC and FISH positive with a ratio 2.45 and a copy #4.9, the second biopsy was HER2 negative with a ratio 2.15 and a copy #3.55   03/29/2022 Cancer Staging   Staging form: Breast, AJCC 8th Edition - Clinical: Stage IIA (cT1c, cN1, cM0, G3, ER+, PR-, HER2+) - Signed by [name], MD on 03/29/2022 Stage prefix: Initial diagnosis Histologic grading system: 3 grade system   03/29/2022 -  Anti-estrogen oral therapy   Letrozole daily   04/21/2022 -  Chemotherapy   Herceptin/Perjeta every 21 days initially x 3, will reassess with MRI and then will decide whether to continue or to add chemo.  (Original recommendation was TCHP x 6)       CHIEF COMPLIANT: Follow-up cycle 3 Herceptin Perjeta and Letrozole  INTERVAL HISTORY: Caitlyn Torres is a 61 y.o. female is here because of recent diagnosis of left breast cancer. She presents to the clinic today for a follow-up. She states that she had less side effects. Denies diarrhea just a little gas. She is tolerating the letrozole denies pain and discomfort in breast.   ALLERGIES:  is allergic to aleve [naproxen sodium], aspirin, macrobid [nitrofurantoin], motrin [ibuprofen], and tylenol  [acetaminophen].  MEDICATIONS:  Current Outpatient Medications  Medication Sig Dispense Refill   carboxymethylcellulose (REFRESH PLUS) 0.5 % SOLN 1 drop 3 (three) times daily as needed (irritation).     cetirizine (ZYRTEC) 10 MG tablet Take 10 mg by mouth daily.     dexamethasone (DECADRON) 4 MG tablet Take 1 tablet (4 mg total) by mouth daily. Take 1 tablet day before chemo and 1 tablet day after chemo with food (Patient not taking: Reported on 04/11/2022) 12 tablet 0   EPINEPHrine 0.3 mg/0.3 mL IJ SOAJ injection Inject 0.3 mg into the muscle as needed for anaphylaxis. Prn     letrozole (FEMARA) 2.5 MG tablet Take 1 tablet (2.5 mg total) by mouth daily. 90 tablet 3   lidocaine-prilocaine (EMLA) cream Apply to affected area once (Patient not taking: Reported on 04/11/2022) 30 g 3   magic mouthwash w/lidocaine SOLN Take 5 mLs by mouth 4 (four) times daily as needed for mouth pain. 240 mL 1   olopatadine (PATANOL) 0.1 % ophthalmic solution Place 1 drop into both eyes daily as needed for allergies.     ondansetron (ZOFRAN) 8 MG tablet Take 1 tablet (8 mg total) by mouth 2 (two) times daily as needed (Nausea or vomiting). Start on the third day after chemotherapy. (Patient not taking: Reported on 04/11/2022) 30 tablet 1   prochlorperazine (COMPAZINE) 10 MG tablet Take 1 tablet (10 mg total) by mouth every 6 (six) hours as needed (Nausea or vomiting). (Patient not taking: Reported on 04/11/2022) 30 tablet 1   No current facility-administered medications  for this visit.    PHYSICAL EXAMINATION: ECOG PERFORMANCE STATUS: 0 - Asymptomatic  Vitals:   06/01/22 1111  BP: (!) 156/82  Pulse: 74  Resp: 18  Temp: (!) 97.2 F (36.2 C)  SpO2: 100%   Filed Weights   06/01/22 1111  Weight: 118 lb 12.8 oz (53.9 kg)    BREAST: No palpable masses or nodules in either   left breast. No palpable axillary supraclavicular or infraclavicular adenopathy no breast tenderness or nipple discharge. (exam performed in  the presence of a chaperone)  LABORATORY DATA:  I have reviewed the data as listed    Latest Ref Rng & Units 05/11/2022   10:38 AM 04/28/2022   10:34 AM 04/21/2022    9:07 AM  CMP  Glucose 70 - 99 mg/dL 106  97  121   BUN 6 - 20 mg/dL $Remove'7  7  8   'DmMkMir$ Creatinine 0.44 - 1.00 mg/dL 0.63  0.57  0.72   Sodium 135 - 145 mmol/L 137  135  138   Potassium 3.5 - 5.1 mmol/L 4.0  4.2  3.9   Chloride 98 - 111 mmol/L 103  102  103   CO2 22 - 32 mmol/L $RemoveB'29  28  28   'GzSyppAU$ Calcium 8.9 - 10.3 mg/dL 9.8  9.8  9.7   Total Protein 6.5 - 8.1 g/dL 6.4  7.2  6.8   Total Bilirubin 0.3 - 1.2 mg/dL 0.6  0.5  0.5   Alkaline Phos 38 - 126 U/L 48  52  44   AST 15 - 41 U/L $Remo'25  23  17   'YwDdg$ ALT 0 - 44 U/L $Remo'23  21  16     'myzfY$ Lab Results  Component Value Date   WBC 3.1 (L) 06/01/2022   HGB 13.3 06/01/2022   HCT 38.8 06/01/2022   MCV 85.7 06/01/2022   PLT 144 (L) 06/01/2022   NEUTROABS 1.5 (L) 06/01/2022    ASSESSMENT & PLAN:  Malignant neoplasm of upper-outer quadrant of left breast in female, estrogen receptor positive ([facility]) 03/14/2022:Screening detected left breast masses 1.5 cm and 1.3 cm with enlarged left axillary lymph node, biopsy revealed grade 3 IDC with lymph node being positive, ER 95%, PR 0%, HER2 2+ by IHC and FISH positive with a ratio 2.45 and a copy #4.9, the second biopsy was HER2 negative with a ratio 2.15 and a copy #3.55   Treatment plan based on multidisciplinary tumor board: 1. Neoadjuvant chemotherapy with Herceptin Perjeta and letrozole for 3 cycles.  Reassess with another MRI and then decide if she can continue with the same plan or do we need to add chemotherapy. (Our original recommendation is TCHP x6 cycles but patient is not willing to go through chemo right away) 2. Followed by breast conserving surgery if possible with sentinel lymph node study 3. Followed by adjuvant radiation therapy if patient had lumpectomy 4.  Adjuvant antiestrogen therapy with  neratinib -------------------------------------------------------------------------------------------------------------------------- Current Treatment: Cycle 3 Herceptin and Perjeta CT CAP and Bone Scan: 04/16/22: No distant mets Toxicities: Severe diarrhea improved with cycle 2. Nausea: Takes Compazine Fatigue 4.  Leukopenia:   RTC in 3 weeks for cycle 4 If she is responding to treatment we will continue with 6 rounds of treatment and then discuss surgery.  If she does not respond to respond then we will have to add chemotherapy. We will plan to do mammogram/ultrasound after this cycle.   Orders Placed This Encounter  Procedures   MM DIAG BREAST TOMO UNI  LEFT    Standing Status:   Future    Standing Expiration Date:   06/02/2023    Order Specific Question:   Reason for Exam (SYMPTOM  OR DIAGNOSIS REQUIRED)    Answer:   Evaluate for breast cancer response    Order Specific Question:   Is the patient pregnant?    Answer:   No    Order Specific Question:   Preferred imaging location?    Answer[facility]   US BREAST LTD UNI LEFT INC AXILLA    Standing Status:   Future    Standing Expiration Date:   06/02/2023    Order Specific Question:   Reason for Exam (SYMPTOM  OR DIAGNOSIS REQUIRED)    Answer:   evaluate response to treatment    Order Specific Question:   Preferred imaging location?    Answer:   [facility]    Order Specific Question:   Release to patient    Answer:   Immediate   The patient has a good understanding of the overall plan. she agrees with it. she will call with any problems that may develop before the next visit here. Total time spent: 30 mins including face to face time and time spent for planning, charting and co-ordination of care   Harriette Ohara, MD 06/01/22    I Gardiner Coins am scribing for Dr. Lindi Adie  I have reviewed the above documentation for accuracy and completeness, and I agree with the above. Marland Kitchen

## 2022-06-01 NOTE — Telephone Encounter (Signed)
I contacted Ms. Garmon to discuss her genetic testing results. No pathogenic variants were identified in the 77 genes analyzed. Detailed clinic note to follow.  The test report has been scanned into EPIC and is located under the Molecular Pathology section of the Results Review tab.  A portion of the result report is included below for reference.   Lucille Passy, MS, [facility] Genetic Counselor [email or web address] (P) [phone]

## 2022-06-01 NOTE — Patient Instructions (Signed)
[facility] ONCOLOG[facility]   Discharge Instructions: Thank you for choosing [facility] to provide your oncology and hematology care.   If you have a lab appointment with the [facility], please go directly to the [facility] and check in at the registration area.   Wear comfortable clothing and clothing appropriate for easy access to any Portacath or PICC line.   We strive to give you quality time with your provider. You may need to reschedule your appointment if you arrive late (15 or more minutes).  Arriving late affects you and other patients whose appointments are after yours.  Also, if you miss three or more appointments without notifying the office, you may be dismissed from the clinic at the provider's discretion.      For prescription refill requests, have your pharmacy contact our office and allow 72 hours for refills to be completed.    Today you received the following chemotherapy and/or immunotherapy agents: trastuzumab-dkst and pertuzumab      To help prevent nausea and vomiting after your treatment, we encourage you to take your nausea medication as directed.  BELOW ARE SYMPTOMS THAT SHOULD BE REPORTED IMMEDIATELY: *FEVER GREATER THAN 100.4 F (38 C) OR HIGHER *CHILLS OR SWEATING *NAUSEA AND VOMITING THAT IS NOT CONTROLLED WITH YOUR NAUSEA MEDICATION *UNUSUAL SHORTNESS OF BREATH *UNUSUAL BRUISING OR BLEEDING *URINARY PROBLEMS (pain or burning when urinating, or frequent urination) *BOWEL PROBLEMS (unusual diarrhea, constipation, pain near the anus) TENDERNESS IN MOUTH AND THROAT WITH OR WITHOUT PRESENCE OF ULCERS (sore throat, sores in mouth, or a toothache) UNUSUAL RASH, SWELLING OR PAIN  UNUSUAL VAGINAL DISCHARGE OR ITCHING   Items with * indicate a potential emergency and should be followed up as soon as possible or go to the Emergency Department if any problems should occur.  Please show the CHEMOTHERAPY ALERT CARD or IMMUNOTHERAPY  ALERT CARD at check-in to the Emergency Department and triage nurse.  Should you have questions after your visit or need to cancel or reschedule your appointment, please contact [facility]  Dept: [phone]  and follow the prompts.  Office hours are 8:00 a.m. to 4:30 p.m. Monday - Friday. Please note that voicemails left after 4:00 p.m. may not be returned until the following business day.  We are closed weekends and major holidays. You have access to a nurse at all times for urgent questions. Please call the main number to the clinic Dept: [phone] and follow the prompts.   For any non-urgent questions, you may also contact your provider using MyChart. We now offer e-Visits for anyone 58 and older to request care online for non-urgent symptoms. For details visit [email or web address].   Also download the MyChart app! Go to the app store, search "MyChart", open the app, select [facility], and log in with your MyChart username and password.  Masks are optional in the cancer centers. If you would like for your care team to wear a mask while they are taking care of you, please let them know. For doctor visits, patients may have with them one support person who is at least 59 years old. At this time, visitors are not allowed in the infusion area.

## 2022-06-01 NOTE — Assessment & Plan Note (Addendum)
03/14/2022:Screening detected left breast masses 1.5 cm and 1.3 cm with enlarged left axillary lymph node, biopsy revealed grade 3 IDC with lymph node being positive, ER 95%, PR 0%, HER2 2+ by IHC and FISH positive with a ratio 2.45 and a copy #4.9, the second biopsy was HER2 negative with a ratio 2.15 and a copy #3.55  Treatment planbased on multidisciplinary tumor board: 1. Neoadjuvant chemotherapy withHerceptin Perjeta and letrozole for 3 cycles. Reassess with another MRI and then decide if she can continue with the same plan or do we need to add chemotherapy. (Our original recommendation is TCHP x6 cycles but patient is not willing to go through chemo right away) 2. Followed by breast conserving surgery if possible with sentinel lymph node study 3. Followed by adjuvant radiation therapy if patient had lumpectomy 4. Adjuvant antiestrogen therapy with neratinib -------------------------------------------------------------------------------------------------------------------------- Current Treatment: Cycle 3 Herceptin and Perjeta CT CAP and Bone Scan: 04/16/22: No distant mets Toxicities: 1. Severe diarrhea improved with cycle 2. 2. Nausea: Takes Compazine 3. Fatigue 4.  Leukopenia:  RTC in 3 weeks for cycle 4 We will plan to do mammogram/ultrasound after this cycle.

## 2022-06-03 ENCOUNTER — Inpatient Hospital Stay: Payer: BC Managed Care – PPO

## 2022-06-05 ENCOUNTER — Other Ambulatory Visit: Payer: Self-pay

## 2022-06-11 ENCOUNTER — Encounter: Payer: Self-pay | Admitting: Adult Health

## 2022-06-11 ENCOUNTER — Encounter: Payer: Self-pay | Admitting: Hematology and Oncology

## 2022-06-11 NOTE — Progress Notes (Signed)
HPI:   Caitlyn Torres was previously seen in the [place] clinic due to a personal and family history of breast cancer and concerns regarding a hereditary predisposition to cancer. Please refer to our prior cancer genetics clinic note for more information regarding our discussion, assessment and recommendations, at the time. Caitlyn Torres recent genetic test results were disclosed to her, as were recommendations warranted by these results. These results and recommendations are discussed in more detail below.  CANCER HISTORY:  Oncology History  Malignant neoplasm of upper-outer quadrant of left breast in female, estrogen receptor positive ([place])  03/14/2022 Initial Diagnosis   Screening detected left breast masses 1.5 cm and 1.3 cm with enlarged left axillary lymph node, biopsy revealed grade 3 IDC with lymph node being positive, ER 95%, PR 0%, HER2 2+ by IHC and FISH positive with a ratio 2.45 and a copy #4.9, the second biopsy was HER2 negative with a ratio 2.15 and a copy #3.55   03/29/2022 Cancer Staging   Staging form: Breast, AJCC 8th Edition - Clinical: Stage IIA (cT1c, cN1, cM0, G3, ER+, PR-, HER2+) - Signed by Nicholas Lose, MD on 03/29/2022 Stage prefix: Initial diagnosis Histologic grading system: 3 grade system   03/29/2022 -  Anti-estrogen oral therapy   Letrozole daily   04/21/2022 -  Chemotherapy   Herceptin/Perjeta every 21 days initially x 3, will reassess with MRI and then will decide whether to continue or to add chemo.  (Original recommendation was TCHP x 6)      Genetic Testing   [facility] CancerNext-Expanded Panel was Negative. Report date is 05/30/2022.  The CancerNext-Expanded gene panel offered by [facility] and includes sequencing, rearrangement, and RNA analysis for the following 77 genes: AIP, ALK, APC, ATM, AXIN2, BAP1, BARD1, BLM, BMPR1A, BRCA1, BRCA2, BRIP1, CDC73, CDH1, CDK4, CDKN1B, CDKN2A, CHEK2, CTNNA1, DICER1, FANCC, FH, FLCN, GALNT12, KIF1B, LZTR1,  MAX, MEN1, MET, MLH1, MSH2, MSH3, MSH6, MUTYH, NBN, NF1, NF2, NTHL1, PALB2, PHOX2B, PMS2, POT1, PRKAR1A, PTCH1, PTEN, RAD51C, RAD51D, RB1, RECQL, RET, SDHA, SDHAF2, SDHB, SDHC, SDHD, SMAD4, SMARCA4, SMARCB1, SMARCE1, STK11, SUFU, TMEM127, TP53, TSC1, TSC2, VHL and XRCC2 (sequencing and deletion/duplication); EGFR, EGLN1, HOXB13, KIT, MITF, PDGFRA, POLD1, and POLE (sequencing only); EPCAM and GREM1 (deletion/duplication only).     FAMILY HISTORY:  We obtained a detailed, 4-generation family history.  Significant diagnoses are listed below:        Family History  Problem Relation Age of Onset   Prostate cancer Father          dx after 64   Breast cancer Sister 60        neg GT   Prostate cancer Maternal Grandfather          d. late 64s   Pancreatic cancer Paternal Grandmother          d. 21s   Parkinson's disease Paternal Grandmother     Bladder Cancer Paternal Grandfather          dx after 54   Breast cancer Cousin          paternal female cousin; dx 36s     Caitlyn Torres's sister had negative hereditary cancer genetic testing in 2017 for 24 genes through [facility]. Caitlyn Torres is unaware of other previous family history of genetic testing for hereditary cancer risks. There is no reported Ashkenazi Jewish ancestry. There is no known consanguinity.  GENETIC TEST RESULTS:  The [facility] CancerNext-Expanded +RNAinsight Panel found no pathogenic mutations.   The CancerNext-Expanded gene panel  offered by [facility] and includes sequencing, rearrangement, and RNA analysis for the following 77 genes: AIP, ALK, APC, ATM, AXIN2, BAP1, BARD1, BLM, BMPR1A, BRCA1, BRCA2, BRIP1, CDC73, CDH1, CDK4, CDKN1B, CDKN2A, CHEK2, CTNNA1, DICER1, FANCC, FH, FLCN, GALNT12, KIF1B, LZTR1, MAX, MEN1, MET, MLH1, MSH2, MSH3, MSH6, MUTYH, NBN, NF1, NF2, NTHL1, PALB2, PHOX2B, PMS2, POT1, PRKAR1A, PTCH1, PTEN, RAD51C, RAD51D, RB1, RECQL, RET, SDHA, SDHAF2, SDHB, SDHC, SDHD, SMAD4, SMARCA4, SMARCB1, SMARCE1, STK11, SUFU,  TMEM127, TP53, TSC1, TSC2, VHL and XRCC2 (sequencing and deletion/duplication); EGFR, EGLN1, HOXB13, KIT, MITF, PDGFRA, POLD1, and POLE (sequencing only); EPCAM and GREM1 (deletion/duplication only).  .   The test report will be scanned into EPIC and is located under the Molecular Pathology section of the Results Review tab.  A portion of the result report is included below for reference. Genetic testing reported out on May 30, 2022.     Even though a pathogenic variant was not identified, possible explanations for the cancer in the family may include: There may be no hereditary risk for cancer in the family. The cancers in Caitlyn Torres and/or her family may be sporadic/familial or due to other genetic and environmental factors. There may be a gene mutation in one of these genes that current testing methods cannot detect but that chance is small. There could be another gene that has not yet been discovered, or that we have not yet tested, that is responsible for the cancer diagnoses in the family.  It is also possible there is a hereditary cause for the cancer in the family that Caitlyn Torres did not inherit.  Therefore, it is important to remain in touch with cancer genetics in the future so that we can continue to offer Caitlyn Torres the most up to date genetic testing.   ADDITIONAL GENETIC TESTING:  We discussed with Caitlyn Torres that her genetic testing was fairly extensive.  If there are additional relevant genes identified to increase cancer risk that can be analyzed in the future, we would be happy to discuss and coordinate this testing at that time.      CANCER SCREENING RECOMMENDATIONS:  Caitlyn Torres test result is considered negative (normal).  This means that we have not identified a hereditary cause for her personal history of breast cancer at this time.   An individual's cancer risk and medical management are not determined by genetic test results alone. Overall cancer risk assessment  incorporates additional factors, including personal medical history, family history, and any available genetic information that may result in a personalized plan for cancer prevention and surveillance. Therefore, it is recommended she continue to follow the cancer management and screening guidelines provided by her oncology and primary healthcare provider.  RECOMMENDATIONS FOR FAMILY MEMBERS:   Since she did not inherit a identifiable mutation in a cancer predisposition gene included on this panel, her children could not have inherited a known mutation from her in one of these genes. Individuals in this family might be at some increased risk of developing cancer, over the general population risk, due to the family history of cancer.  Individuals in the family should notify their providers of the family history of cancer. We recommend women in this family have a yearly mammogram beginning at age 11, or 41 years younger than the earliest onset of cancer, an annual clinical breast exam, and perform monthly breast self-exams.   Other members of the family may still carry a pathogenic variant in one of these genes that Caitlyn Torres did not inherit.  Based on the family history, we recommend her paternal cousin, who was diagnosed with breast cancer in her 83s, have genetic counseling and testing. Ms. Yeatman can let us know if we can be of any assistance in coordinating genetic counseling and/or testing for this family member.     FOLLOW-UP:  Lastly, we discussed with Caitlyn Torres that cancer genetics is a rapidly advancing field and it is possible that new genetic tests will be appropriate for her and/or her family members in the future. We encouraged her to remain in contact with cancer genetics on an annual basis so we can update her personal and family histories and let her know of advances in cancer genetics that may benefit this family.   Our contact number was provided. Caitlyn Torres questions were answered  to her satisfaction, and she knows she is welcome to call us at anytime with additional questions or concerns.   Breyon Blass M. Joette Catching, [place], [facility] Genetic Counselor [email or web address] (P) [phone]

## 2022-06-13 NOTE — Progress Notes (Signed)
Patient Care Team: Bradd Canary, MD as PCP - General (Family Medicine) Mauro Kaufmann, RN as Oncology Nurse Navigator Rockwell Germany, RN as Oncology Nurse Navigator Nicholas Lose, MD as Consulting Physician (Hematology and Oncology)  DIAGNOSIS:  Encounter Diagnosis  Name Primary?   Malignant neoplasm of upper-outer quadrant of left breast in female, estrogen receptor positive ([place])     SUMMARY OF ONCOLOGIC HISTORY: Oncology History  Malignant neoplasm of upper-outer quadrant of left breast in female, estrogen receptor positive ([place])  03/14/2022 Initial Diagnosis   Screening detected left breast masses 1.5 cm and 1.3 cm with enlarged left axillary lymph node, biopsy revealed grade 3 IDC with lymph node being positive, ER 95%, PR 0%, HER2 2+ by IHC and FISH positive with a ratio 2.45 and a copy #4.9, the second biopsy was HER2 negative with a ratio 2.15 and a copy #3.55   03/29/2022 Cancer Staging   Staging form: Breast, AJCC 8th Edition - Clinical: Stage IIA (cT1c, cN1, cM0, G3, ER+, PR-, HER2+) - Signed by Nicholas Lose, MD on 03/29/2022 Stage prefix: Initial diagnosis Histologic grading system: 3 grade system   03/29/2022 -  Anti-estrogen oral therapy   Letrozole daily   04/21/2022 -  Chemotherapy   Herceptin/Perjeta every 21 days initially x 3, will reassess with MRI and then will decide whether to continue or to add chemo.  (Original recommendation was TCHP x 6)      Genetic Testing   [facility] CancerNext-Expanded Panel was Negative. Report date is 05/30/2022.  The CancerNext-Expanded gene panel offered by [facility] and includes sequencing, rearrangement, and RNA analysis for the following 77 genes: AIP, ALK, APC, ATM, AXIN2, BAP1, BARD1, BLM, BMPR1A, BRCA1, BRCA2, BRIP1, CDC73, CDH1, CDK4, CDKN1B, CDKN2A, CHEK2, CTNNA1, DICER1, FANCC, FH, FLCN, GALNT12, KIF1B, LZTR1, MAX, MEN1, MET, MLH1, MSH2, MSH3, MSH6, MUTYH, NBN, NF1, NF2, NTHL1, PALB2, PHOX2B, PMS2, POT1, PRKAR1A,  PTCH1, PTEN, RAD51C, RAD51D, RB1, RECQL, RET, SDHA, SDHAF2, SDHB, SDHC, SDHD, SMAD4, SMARCA4, SMARCB1, SMARCE1, STK11, SUFU, TMEM127, TP53, TSC1, TSC2, VHL and XRCC2 (sequencing and deletion/duplication); EGFR, EGLN1, HOXB13, KIT, MITF, PDGFRA, POLD1, and POLE (sequencing only); EPCAM and GREM1 (deletion/duplication only).      CHIEF COMPLIANT: follow-up Herceptin and Perjeta  INTERVAL HISTORY: MAVI UN is a 61 year old above-mentioned history of HER2 positive breast cancer was currently on Herceptin and Perjeta.  She is tolerating the treatment reasonably well.  She did have a rash that showed up for a few days on her back and her chest and arms.  It seemed to resolve without any problems.  She also had 1 day of profound diarrhea up to 10 times a day.  She did not take Imodium.  Did not experience any other side effects.   ALLERGIES:  is allergic to aleve [naproxen sodium], aspirin, macrobid [nitrofurantoin], motrin [ibuprofen], and tylenol [acetaminophen].  MEDICATIONS:  Current Outpatient Medications  Medication Sig Dispense Refill   carboxymethylcellulose (REFRESH PLUS) 0.5 % SOLN 1 drop 3 (three) times daily as needed (irritation).     cetirizine (ZYRTEC) 10 MG tablet Take 10 mg by mouth daily.     dexamethasone (DECADRON) 4 MG tablet Take 1 tablet (4 mg total) by mouth daily. Take 1 tablet day before chemo and 1 tablet day after chemo with food (Patient not taking: Reported on 04/11/2022) 12 tablet 0   EPINEPHrine 0.3 mg/0.3 mL IJ SOAJ injection Inject 0.3 mg into the muscle as needed for anaphylaxis. Prn     letrozole (FEMARA) 2.5 MG  tablet Take 1 tablet (2.5 mg total) by mouth daily. 90 tablet 3   lidocaine-prilocaine (EMLA) cream Apply to affected area once (Patient not taking: Reported on 04/11/2022) 30 g 3   magic mouthwash w/lidocaine SOLN Take 5 mLs by mouth 4 (four) times daily as needed for mouth pain. 240 mL 1   olopatadine (PATANOL) 0.1 % ophthalmic solution Place 1  drop into both eyes daily as needed for allergies.     ondansetron (ZOFRAN) 8 MG tablet Take 1 tablet (8 mg total) by mouth 2 (two) times daily as needed (Nausea or vomiting). Start on the third day after chemotherapy. (Patient not taking: Reported on 04/11/2022) 30 tablet 1   prochlorperazine (COMPAZINE) 10 MG tablet Take 1 tablet (10 mg total) by mouth every 6 (six) hours as needed (Nausea or vomiting). (Patient not taking: Reported on 04/11/2022) 30 tablet 1   No current facility-administered medications for this visit.    PHYSICAL EXAMINATION: ECOG PERFORMANCE STATUS: 1 - Symptomatic but completely ambulatory  Vitals:   06/22/22 0951  BP: 135/71  Pulse: 75  Resp: 18  Temp: (!) 97.5 F (36.4 C)  SpO2: 100%   Filed Weights   06/22/22 0951  Weight: 119 lb 1.6 oz (54 kg)      LABORATORY DATA:  I have reviewed the data as listed    Latest Ref Rng & Units 06/01/2022   11:00 AM 05/11/2022   10:38 AM 04/28/2022   10:34 AM  CMP  Glucose 70 - 99 mg/dL 91  106  97   BUN 6 - 20 mg/dL _0 Creatinine 0.44 - 1.00 mg/dL 0.55  0.63  0.57   Sodium 135 - 145 mmol/L 138  137  135   Potassium 3.5 - 5.1 mmol/L 4.1  4.0  4.2   Chloride 98 - 111 mmol/L 105  103  102   CO2 22 - 32 mmol/L _1 Calcium 8.9 - 10.3 mg/dL 9.6  9.8  9.8   Total Protein 6.5 - 8.1 g/dL 6.5  6.4  7.2   Total Bilirubin 0.3 - 1.2 mg/dL 0.4  0.6  0.5   Alkaline Phos 38 - 126 U/L 63  48  52   AST 15 - 41 U/L _2 ALT 0 - 44 U/L _3 Lab Results  Component Value Date   WBC 3.3 (L) 06/22/2022   HGB 13.2 06/22/2022   HCT 37.8 06/22/2022   MCV 85.1 06/22/2022   PLT 152 06/22/2022   NEUTROABS 1.7 06/22/2022    ASSESSMENT & PLAN:  Malignant neoplasm of upper-outer quadrant of left breast in female, estrogen receptor positive ([place]) 03/14/2022:Screening detected left breast masses 1.5 cm and 1.3 cm with enlarged left axillary lymph node, biopsy revealed grade 3 IDC with lymph node being  positive, ER 95%, PR 0%, HER2 2+ by IHC and FISH positive with a ratio 2.45 and a copy #4.9, the second biopsy was HER2 negative with a ratio 2.15 and a copy #3.55   Treatment plan based on multidisciplinary tumor board: 1. Neoadjuvant chemotherapy with Herceptin Perjeta and letrozole for 3 cycles.  Reassess with another MRI and then decide if she can continue with the same plan or do we need to add chemotherapy. (Our original recommendation is TCHP x6 cycles but patient is not willing to go through chemo right away) 2. Followed by breast conserving  surgery if possible with sentinel lymph node study 3. Followed by adjuvant radiation therapy if patient had lumpectomy 4.  Adjuvant antiestrogen therapy with neratinib -------------------------------------------------------------------------------------------------------------------------- Current Treatment: Cycle 4 Herceptin and Perjeta CT CAP and Bone Scan: 04/16/22: No distant mets Toxicities: Severe diarrhea that lasted 24 hours.  She did not take Imodium.  Instructed to take Imodium next time. Nausea: Takes Compazine Fatigue 4.  Leukopenia: Improving 5.  Maculopapular rash: Seems to come on after each treatment but it seems to fade away.  Will monitor   Mammogram and ultrasound 06/20/2022: Interval decrease in the size of the mass from 1.3 cm to 1 cm.  Second mass measured 1.3 cm and noted 0.8 cm.  Left axillary ultrasound 1.1 cm lymph node previously was 0.6.  I discussed with the patient that the primary tumors in the breast have responded but the axillary lymph node has not.    We will conduct 3 more cycles of Herceptin and Perjeta and then repeat the mammogram and ultrasound.    No orders of the defined types were placed in this encounter.  The patient has a good understanding of the overall plan. she agrees with it. she will call with any problems that may develop before the next visit here. Total time spent: 30 mins including face to  face time and time spent for planning, charting and co-ordination of care   Harriette Ohara, MD 06/22/22    I Gardiner Coins am scribing for Dr. Lindi Adie  I have reviewed the above documentation for accuracy and completeness, and I agree with the above.

## 2022-06-20 ENCOUNTER — Ambulatory Visit
Admission: RE | Admit: 2022-06-20 | Discharge: 2022-06-20 | Disposition: A | Discharge status: Home / Self Care | Payer: BC Managed Care – PPO | Source: Ambulatory Visit | Attending: Hematology and Oncology | Admitting: Hematology and Oncology

## 2022-06-20 ENCOUNTER — Other Ambulatory Visit: Payer: Self-pay | Admitting: Hematology and Oncology

## 2022-06-20 DIAGNOSIS — Z170 Estrogen receptor positive status [ER+]: Secondary | ICD-10-CM

## 2022-06-21 MED FILL — Dexamethasone Sodium Phosphate Inj 100 MG/10ML: INTRAMUSCULAR | Qty: 1 | Status: AC

## 2022-06-21 NOTE — Assessment & Plan Note (Signed)
03/14/2022:Screening detected left breast masses 1.5 cm and 1.3 cm with enlarged left axillary lymph node, biopsy revealed grade 3 IDC with lymph node being positive, ER 95%, PR 0%, HER2 2+ by IHC and FISH positive with a ratio 2.45 and a copy #4.9, the second biopsy was HER2 negative with a ratio 2.15 and a copy #3.55  Treatment planbased on multidisciplinary tumor board: 1. Neoadjuvant chemotherapy withHerceptin Perjeta and letrozole for 3 cycles. Reassess with another MRI and then decide if she can continue with the same plan or do we need to add chemotherapy. (Our original recommendation is TCHP x6 cycles but patient is not willing to go through chemo right away) 2. Followed by breast conserving surgery if possible with sentinel lymph node study 3. Followed by adjuvant radiation therapy if patient had lumpectomy 4. Adjuvant antiestrogen therapy with neratinib -------------------------------------------------------------------------------------------------------------------------- Current Treatment: Cycle 4 Herceptin and Perjeta CT CAP and Bone Scan: 04/16/22: No distant mets Toxicities: 1. Severe diarrhea that lasted 24 hours.  She did not take Imodium.  Instructed to take Imodium next time. 2. Nausea: Takes Compazine 3. Fatigue 4.  Leukopenia: Improving 5.  Maculopapular rash: Seems to come on after each treatment but it seems to fade away.  Will monitor  Mammogram and ultrasound 06/20/2022: Interval decrease in the size of the mass from 1.3 cm to 1 cm.  Second mass measured 1.3 cm and noted 0.8 cm.  Left axillary ultrasound 1.1 cm lymph node previously was 0.6.  I discussed with the patient that the primary tumors in the breast have responded but the axillary lymph node has not.    We will conduct 3 more cycles of Herceptin and Perjeta and then repeat the mammogram and ultrasound.

## 2022-06-22 ENCOUNTER — Inpatient Hospital Stay: Payer: BC Managed Care – PPO

## 2022-06-22 ENCOUNTER — Other Ambulatory Visit: Payer: Self-pay

## 2022-06-22 ENCOUNTER — Inpatient Hospital Stay: Payer: BC Managed Care – PPO | Admitting: Hematology and Oncology

## 2022-06-22 ENCOUNTER — Encounter: Payer: Self-pay | Admitting: *Deleted

## 2022-06-22 ENCOUNTER — Other Ambulatory Visit: Payer: Self-pay | Admitting: Hematology and Oncology

## 2022-06-22 ENCOUNTER — Inpatient Hospital Stay: Payer: BC Managed Care – PPO | Attending: Hematology and Oncology

## 2022-06-22 VITALS — BP 130/77 | HR 76 | Resp 17

## 2022-06-22 DIAGNOSIS — R197 Diarrhea, unspecified: Secondary | ICD-10-CM | POA: Diagnosis not present

## 2022-06-22 DIAGNOSIS — Z5112 Encounter for antineoplastic immunotherapy: Secondary | ICD-10-CM | POA: Insufficient documentation

## 2022-06-22 DIAGNOSIS — R110 Nausea: Secondary | ICD-10-CM | POA: Diagnosis not present

## 2022-06-22 DIAGNOSIS — C50412 Malignant neoplasm of upper-outer quadrant of left female breast: Secondary | ICD-10-CM | POA: Insufficient documentation

## 2022-06-22 DIAGNOSIS — Z170 Estrogen receptor positive status [ER+]: Secondary | ICD-10-CM | POA: Diagnosis not present

## 2022-06-22 DIAGNOSIS — Z79811 Long term (current) use of aromatase inhibitors: Secondary | ICD-10-CM | POA: Diagnosis not present

## 2022-06-22 DIAGNOSIS — D72819 Decreased white blood cell count, unspecified: Secondary | ICD-10-CM | POA: Diagnosis not present

## 2022-06-22 DIAGNOSIS — Z7952 Long term (current) use of systemic steroids: Secondary | ICD-10-CM | POA: Insufficient documentation

## 2022-06-22 DIAGNOSIS — Z95828 Presence of other vascular implants and grafts: Secondary | ICD-10-CM

## 2022-06-22 DIAGNOSIS — R5383 Other fatigue: Secondary | ICD-10-CM | POA: Insufficient documentation

## 2022-06-22 DIAGNOSIS — R21 Rash and other nonspecific skin eruption: Secondary | ICD-10-CM | POA: Insufficient documentation

## 2022-06-22 LAB — CBC WITH DIFFERENTIAL/PLATELET
Abs Immature Granulocytes: 0.01 10*3/uL (ref 0.00–0.07)
Basophils Absolute: 0 10*3/uL (ref 0.0–0.1)
Basophils Relative: 1 %
Eosinophils Absolute: 0.1 10*3/uL (ref 0.0–0.5)
Eosinophils Relative: 3 %
HCT: 37.8 % (ref 36.0–46.0)
Hemoglobin: 13.2 g/dL (ref 12.0–15.0)
Immature Granulocytes: 0 %
Lymphocytes Relative: 31 %
Lymphs Abs: 1 10*3/uL (ref 0.7–4.0)
MCH: 29.7 pg (ref 26.0–34.0)
MCHC: 34.9 g/dL (ref 30.0–36.0)
MCV: 85.1 fL (ref 80.0–100.0)
Monocytes Absolute: 0.4 10*3/uL (ref 0.1–1.0)
Monocytes Relative: 13 %
Neutro Abs: 1.7 10*3/uL (ref 1.7–7.7)
Neutrophils Relative %: 52 %
Platelets: 152 10*3/uL (ref 150–400)
RBC: 4.44 MIL/uL (ref 3.87–5.11)
RDW: 14.1 % (ref 11.5–15.5)
WBC: 3.3 10*3/uL — ABNORMAL LOW (ref 4.0–10.5)
nRBC: 0 % (ref 0.0–0.2)

## 2022-06-22 LAB — COMPREHENSIVE METABOLIC PANEL
ALT: 25 U/L (ref 0–44)
AST: 25 U/L (ref 15–41)
Albumin: 4.3 g/dL (ref 3.5–5.0)
Alkaline Phosphatase: 58 U/L (ref 38–126)
Anion gap: 5 (ref 5–15)
BUN: 9 mg/dL (ref 6–20)
CO2: 28 mmol/L (ref 22–32)
Calcium: 9.1 mg/dL (ref 8.9–10.3)
Chloride: 105 mmol/L (ref 98–111)
Creatinine, Ser: 0.55 mg/dL (ref 0.44–1.00)
GFR, Estimated: >60 mL/min (ref >60)
Glucose, Bld: 94 mg/dL (ref 70–99)
Potassium: 3.8 mmol/L (ref 3.5–5.1)
Sodium: 138 mmol/L (ref 135–145)
Total Bilirubin: 0.3 mg/dL (ref 0.3–1.2)
Total Protein: 6.8 g/dL (ref 6.5–8.1)

## 2022-06-22 MED ORDER — TRASTUZUMAB-DKST CHEMO 150 MG IV SOLR
6.0000 mg/kg | Freq: Once | INTRAVENOUS | Status: AC
  Administered 2022-06-22: 336 mg via INTRAVENOUS
  Filled 2022-06-22: qty 16

## 2022-06-22 MED ORDER — SODIUM CHLORIDE 0.9% FLUSH
10.0000 mL | INTRAVENOUS | Status: DC | PRN
  Administered 2022-06-22: 10 mL

## 2022-06-22 MED ORDER — SODIUM CHLORIDE 0.9% FLUSH
10.0000 mL | Freq: Once | INTRAVENOUS | Status: AC
  Administered 2022-06-22: 10 mL

## 2022-06-22 MED ORDER — DIPHENHYDRAMINE HCL 25 MG PO CAPS
ORAL_CAPSULE | ORAL | Status: AC
  Filled 2022-06-22: qty 1

## 2022-06-22 MED ORDER — HEPARIN SOD (PORK) LOCK FLUSH 100 UNIT/ML IV SOLN
500.0000 [IU] | Freq: Once | INTRAVENOUS | Status: AC | PRN
  Administered 2022-06-22: 500 [IU]

## 2022-06-22 MED ORDER — DIPHENHYDRAMINE HCL 25 MG PO CAPS
25.0000 mg | ORAL_CAPSULE | Freq: Once | ORAL | Status: AC
  Administered 2022-06-22: 25 mg via ORAL

## 2022-06-22 MED ORDER — SODIUM CHLORIDE 0.9 % IV SOLN: Freq: Once | INTRAVENOUS | Status: AC

## 2022-06-22 MED ORDER — SODIUM CHLORIDE 0.9 % IV SOLN
420.0000 mg | Freq: Once | INTRAVENOUS | Status: AC
  Administered 2022-06-22: 420 mg via INTRAVENOUS
  Filled 2022-06-22: qty 14

## 2022-06-22 NOTE — Patient Instructions (Signed)
[facility] MEDICAL ONCOLOGY  Discharge Instructions: Thank you for choosing [facility] to provide your oncology and hematology care.   If you have a lab appointment with the Cancer Center, please go directly to the Cancer Center and check in at the registration area.   Wear comfortable clothing and clothing appropriate for easy access to any Portacath or PICC line.   We strive to give you quality time with your provider. You may need to reschedule your appointment if you arrive late (15 or more minutes).  Arriving late affects you and other patients whose appointments are after yours.  Also, if you miss three or more appointments without notifying the office, you may be dismissed from the clinic at the provider's discretion.      For prescription refill requests, have your pharmacy contact our office and allow 72 hours for refills to be completed.    Today you received the following chemotherapy and/or immunotherapy agents: Trastuzumab and Pertuzumab       To help prevent nausea and vomiting after your treatment, we encourage you to take your nausea medication as directed.  BELOW ARE SYMPTOMS THAT SHOULD BE REPORTED IMMEDIATELY: *FEVER GREATER THAN 100.4 F (38 C) OR HIGHER *CHILLS OR SWEATING *NAUSEA AND VOMITING THAT IS NOT CONTROLLED WITH YOUR NAUSEA MEDICATION *UNUSUAL SHORTNESS OF BREATH *UNUSUAL BRUISING OR BLEEDING *URINARY PROBLEMS (pain or burning when urinating, or frequent urination) *BOWEL PROBLEMS (unusual diarrhea, constipation, pain near the anus) TENDERNESS IN MOUTH AND THROAT WITH OR WITHOUT PRESENCE OF ULCERS (sore throat, sores in mouth, or a toothache) UNUSUAL RASH, SWELLING OR PAIN  UNUSUAL VAGINAL DISCHARGE OR ITCHING   Items with * indicate a potential emergency and should be followed up as soon as possible or go to the Emergency Department if any problems should occur.  Please show the CHEMOTHERAPY ALERT CARD or IMMUNOTHERAPY ALERT  CARD at check-in to the Emergency Department and triage nurse.  Should you have questions after your visit or need to cancel or reschedule your appointment, please contact [facility]ICAL [facility]COL[facility]  Dept: [phone]  and follow the prompts.  Office hours are 8:00 a.m. to 4:30 p.m. Monday - Friday. Please note that voicemails left after 4:00 p.m. may not be returned until the following business day.  We are closed weekends and major holidays. You have access to a nurse at all times for urgent questions. Please call the main number to the clinic Dept: [phone] and follow the prompts.   For any non-urgent questions, you may also contact your provider using MyChart. We now offer e-Visits for anyone 18 and older to request care online for non-urgent symptoms. For details visit [email or web address].   Also download the MyChart app! Go to the app store, search "MyChart", open the app, select , and log in with your MyChart username and password.  Masks are optional in the cancer centers. If you would like for your care team to wear a mask while they are taking care of you, please let them know. You may have one support person who is at least 59 years old accompany you for your appointments. 

## 2022-06-23 ENCOUNTER — Telehealth: Payer: Self-pay | Admitting: Hematology and Oncology

## 2022-06-23 NOTE — Telephone Encounter (Signed)
Contacted patient to scheduled appointments. Patient is aware of appointments that are scheduled.   

## 2022-06-24 ENCOUNTER — Other Ambulatory Visit: Payer: Self-pay

## 2022-06-24 ENCOUNTER — Inpatient Hospital Stay: Payer: BC Managed Care – PPO

## 2022-07-01 ENCOUNTER — Other Ambulatory Visit: Payer: Self-pay

## 2022-07-03 ENCOUNTER — Encounter: Payer: Self-pay | Admitting: *Deleted

## 2022-07-12 NOTE — Progress Notes (Signed)
Patient Care Team: Bradd Canary, MD as PCP - General (Family Medicine) Mauro Kaufmann, RN as Oncology Nurse Navigator Rockwell Germany, RN as Oncology Nurse Navigator Nicholas Lose, MD as Consulting Physician (Hematology and Oncology)  DIAGNOSIS: No diagnosis found.  SUMMARY OF ONCOLOGIC HISTORY: Oncology History  Malignant neoplasm of upper-outer quadrant of left breast in female, estrogen receptor positive ([place])  03/14/2022 Initial Diagnosis   Screening detected left breast masses 1.5 cm and 1.3 cm with enlarged left axillary lymph node, biopsy revealed grade 3 IDC with lymph node being positive, ER 95%, PR 0%, HER2 2+ by IHC and FISH positive with a ratio 2.45 and a copy #4.9, the second biopsy was HER2 negative with a ratio 2.15 and a copy #3.55   03/29/2022 Cancer Staging   Staging form: Breast, AJCC 8th Edition - Clinical: Stage IIA (cT1c, cN1, cM0, G3, ER+, PR-, HER2+) - Signed by Nicholas Lose, MD on 03/29/2022 Stage prefix: Initial diagnosis Histologic grading system: 3 grade system   03/29/2022 -  Anti-estrogen oral therapy   Letrozole daily   04/21/2022 -  Chemotherapy   Herceptin/Perjeta every 21 days initially x 3, will reassess with MRI and then will decide whether to continue or to add chemo.  (Original recommendation was TCHP x 6)      Genetic Testing   [facility] CancerNext-Expanded Panel was Negative. Report date is 05/30/2022.  The CancerNext-Expanded gene panel offered by [employer] and includes sequencing, rearrangement, and RNA analysis for the following 77 genes: AIP, ALK, APC, ATM, AXIN2, BAP1, BARD1, BLM, BMPR1A, BRCA1, BRCA2, BRIP1, CDC73, CDH1, CDK4, CDKN1B, CDKN2A, CHEK2, CTNNA1, DICER1, FANCC, FH, FLCN, GALNT12, KIF1B, LZTR1, MAX, MEN1, MET, MLH1, MSH2, MSH3, MSH6, MUTYH, NBN, NF1, NF2, NTHL1, PALB2, PHOX2B, PMS2, POT1, PRKAR1A, PTCH1, PTEN, RAD51C, RAD51D, RB1, RECQL, RET, SDHA, SDHAF2, SDHB, SDHC, SDHD, SMAD4, SMARCA4, SMARCB1, SMARCE1, STK11, SUFU,  TMEM127, TP53, TSC1, TSC2, VHL and XRCC2 (sequencing and deletion/duplication); EGFR, EGLN1, HOXB13, KIT, MITF, PDGFRA, POLD1, and POLE (sequencing only); EPCAM and GREM1 (deletion/duplication only).      CHIEF COMPLIANT:  follow-up Herceptin and Perjeta  INTERVAL HISTORY: SHENIQUA CAROLAN is a 61 year old above-mentioned history of HER2 positive breast cancer was currently on Herceptin and Perjeta.    ALLERGIES:  is allergic to aleve [naproxen sodium], aspirin, macrobid [nitrofurantoin], motrin [ibuprofen], and tylenol [acetaminophen].  MEDICATIONS:  Current Outpatient Medications  Medication Sig Dispense Refill   carboxymethylcellulose (REFRESH PLUS) 0.5 % SOLN 1 drop 3 (three) times daily as needed (irritation).     cetirizine (ZYRTEC) 10 MG tablet Take 10 mg by mouth daily.     dexamethasone (DECADRON) 4 MG tablet Take 1 tablet (4 mg total) by mouth daily. Take 1 tablet day before chemo and 1 tablet day after chemo with food (Patient not taking: Reported on 04/11/2022) 12 tablet 0   EPINEPHrine 0.3 mg/0.3 mL IJ SOAJ injection Inject 0.3 mg into the muscle as needed for anaphylaxis. Prn     letrozole (FEMARA) 2.5 MG tablet Take 1 tablet (2.5 mg total) by mouth daily. 90 tablet 3   lidocaine-prilocaine (EMLA) cream Apply to affected area once (Patient not taking: Reported on 04/11/2022) 30 g 3   magic mouthwash w/lidocaine SOLN Take 5 mLs by mouth 4 (four) times daily as needed for mouth pain. 240 mL 1   olopatadine (PATANOL) 0.1 % ophthalmic solution Place 1 drop into both eyes daily as needed for allergies.     ondansetron (ZOFRAN) 8 MG tablet Take 1 tablet (  8 mg total) by mouth 2 (two) times daily as needed (Nausea or vomiting). Start on the third day after chemotherapy. (Patient not taking: Reported on 04/11/2022) 30 tablet 1   prochlorperazine (COMPAZINE) 10 MG tablet Take 1 tablet (10 mg total) by mouth every 6 (six) hours as needed (Nausea or vomiting). (Patient not taking: Reported  on 04/11/2022) 30 tablet 1   No current facility-administered medications for this visit.    PHYSICAL EXAMINATION: ECOG PERFORMANCE STATUS: {CHL ONC ECOG PS:[phone]}  There were no vitals filed for this visit. There were no vitals filed for this visit.  BREAST:*** No palpable masses or nodules in either right or left breasts. No palpable axillary supraclavicular or infraclavicular adenopathy no breast tenderness or nipple discharge. (exam performed in the presence of a chaperone)  LABORATORY DATA:  I have reviewed the data as listed    Latest Ref Rng & Units 06/22/2022    9:33 AM 06/01/2022   11:00 AM 05/11/2022   10:38 AM  CMP  Glucose 70 - 99 mg/dL 94  91  106   BUN 6 - 20 mg/dL _0 Creatinine 0.44 - 1.00 mg/dL 0.55  0.55  0.63   Sodium 135 - 145 mmol/L 138  138  137   Potassium 3.5 - 5.1 mmol/L 3.8  4.1  4.0   Chloride 98 - 111 mmol/L 105  105  103   CO2 22 - 32 mmol/L _1 Calcium 8.9 - 10.3 mg/dL 9.1  9.6  9.8   Total Protein 6.5 - 8.1 g/dL 6.8  6.5  6.4   Total Bilirubin 0.3 - 1.2 mg/dL 0.3  0.4  0.6   Alkaline Phos 38 - 126 U/L 58  63  48   AST 15 - 41 U/L _2 ALT 0 - 44 U/L _3 Lab Results  Component Value Date   WBC 3.3 (L) 06/22/2022   HGB 13.2 06/22/2022   HCT 37.8 06/22/2022   MCV 85.1 06/22/2022   PLT 152 06/22/2022   NEUTROABS 1.7 06/22/2022    ASSESSMENT & PLAN:  No problem-specific Assessment & Plan notes found for this encounter.    No orders of the defined types were placed in this encounter.  The patient has a good understanding of the overall plan. she agrees with it. she will call with any problems that may develop before the next visit here. Total time spent: 30 mins including face to face time and time spent for planning, charting and co-ordination of care   Suzzette Righter, Drain 07/12/22    I Gardiner Coins am scribing for Dr. Lindi Adie  ***

## 2022-07-13 ENCOUNTER — Inpatient Hospital Stay: Payer: BC Managed Care – PPO

## 2022-07-13 ENCOUNTER — Encounter: Payer: Self-pay | Admitting: *Deleted

## 2022-07-13 ENCOUNTER — Inpatient Hospital Stay: Payer: BC Managed Care – PPO | Admitting: Hematology and Oncology

## 2022-07-13 ENCOUNTER — Other Ambulatory Visit: Payer: Self-pay

## 2022-07-13 VITALS — BP 144/64 | HR 70 | Temp 97.7°F | Resp 18 | Ht 63.0 in | Wt 118.1 lb

## 2022-07-13 VITALS — BP 139/77 | HR 75 | Resp 17

## 2022-07-13 DIAGNOSIS — Z170 Estrogen receptor positive status [ER+]: Secondary | ICD-10-CM

## 2022-07-13 DIAGNOSIS — C50412 Malignant neoplasm of upper-outer quadrant of left female breast: Secondary | ICD-10-CM | POA: Diagnosis not present

## 2022-07-13 DIAGNOSIS — Z79899 Other long term (current) drug therapy: Secondary | ICD-10-CM

## 2022-07-13 DIAGNOSIS — Z95828 Presence of other vascular implants and grafts: Secondary | ICD-10-CM

## 2022-07-13 DIAGNOSIS — Z5181 Encounter for therapeutic drug level monitoring: Secondary | ICD-10-CM | POA: Diagnosis not present

## 2022-07-13 LAB — CBC WITH DIFFERENTIAL/PLATELET
Abs Immature Granulocytes: 0.01 10*3/uL (ref 0.00–0.07)
Basophils Absolute: 0.1 10*3/uL (ref 0.0–0.1)
Basophils Relative: 2 %
Eosinophils Absolute: 0.2 10*3/uL (ref 0.0–0.5)
Eosinophils Relative: 5 %
HCT: 37.7 % (ref 36.0–46.0)
Hemoglobin: 13.1 g/dL (ref 12.0–15.0)
Immature Granulocytes: 0 %
Lymphocytes Relative: 37 %
Lymphs Abs: 1.3 10*3/uL (ref 0.7–4.0)
MCH: 30 pg (ref 26.0–34.0)
MCHC: 34.7 g/dL (ref 30.0–36.0)
MCV: 86.5 fL (ref 80.0–100.0)
Monocytes Absolute: 0.4 10*3/uL (ref 0.1–1.0)
Monocytes Relative: 10 %
Neutro Abs: 1.6 10*3/uL — ABNORMAL LOW (ref 1.7–7.7)
Neutrophils Relative %: 46 %
Platelets: 164 10*3/uL (ref 150–400)
RBC: 4.36 MIL/uL (ref 3.87–5.11)
RDW: 14.5 % (ref 11.5–15.5)
WBC: 3.4 10*3/uL — ABNORMAL LOW (ref 4.0–10.5)
nRBC: 0 % (ref 0.0–0.2)

## 2022-07-13 LAB — COMPREHENSIVE METABOLIC PANEL
ALT: 33 U/L (ref 0–44)
AST: 28 U/L (ref 15–41)
Albumin: 4.4 g/dL (ref 3.5–5.0)
Alkaline Phosphatase: 57 U/L (ref 38–126)
Anion gap: 4 — ABNORMAL LOW (ref 5–15)
BUN: 10 mg/dL (ref 6–20)
CO2: 29 mmol/L (ref 22–32)
Calcium: 9.6 mg/dL (ref 8.9–10.3)
Chloride: 104 mmol/L (ref 98–111)
Creatinine, Ser: 0.55 mg/dL (ref 0.44–1.00)
GFR, Estimated: >60 mL/min (ref >60)
Glucose, Bld: 96 mg/dL (ref 70–99)
Potassium: 4.1 mmol/L (ref 3.5–5.1)
Sodium: 137 mmol/L (ref 135–145)
Total Bilirubin: 0.4 mg/dL (ref 0.3–1.2)
Total Protein: 6.7 g/dL (ref 6.5–8.1)

## 2022-07-13 MED ORDER — SODIUM CHLORIDE 0.9% FLUSH
10.0000 mL | Freq: Once | INTRAVENOUS | Status: AC
  Administered 2022-07-13: 10 mL

## 2022-07-13 MED ORDER — ESTRADIOL 0.1 MG/GM VA CREA: 1.0000 | TOPICAL_CREAM | VAGINAL | 12 refills | Status: DC

## 2022-07-13 MED ORDER — TRASTUZUMAB-DKST CHEMO 150 MG IV SOLR
6.0000 mg/kg | Freq: Once | INTRAVENOUS | Status: AC
  Administered 2022-07-13: 336 mg via INTRAVENOUS
  Filled 2022-07-13: qty 16

## 2022-07-13 MED ORDER — DIPHENHYDRAMINE HCL 25 MG PO CAPS
25.0000 mg | ORAL_CAPSULE | Freq: Once | ORAL | Status: AC
  Administered 2022-07-13: 25 mg via ORAL
  Filled 2022-07-13: qty 1

## 2022-07-13 MED ORDER — SODIUM CHLORIDE 0.9% FLUSH
10.0000 mL | INTRAVENOUS | Status: DC | PRN
  Administered 2022-07-13: 10 mL

## 2022-07-13 MED ORDER — HEPARIN SOD (PORK) LOCK FLUSH 100 UNIT/ML IV SOLN
500.0000 [IU] | Freq: Once | INTRAVENOUS | Status: AC | PRN
  Administered 2022-07-13: 500 [IU]

## 2022-07-13 MED ORDER — SODIUM CHLORIDE 0.9 % IV SOLN
420.0000 mg | Freq: Once | INTRAVENOUS | Status: AC
  Administered 2022-07-13: 420 mg via INTRAVENOUS
  Filled 2022-07-13: qty 14

## 2022-07-13 MED ORDER — SODIUM CHLORIDE 0.9 % IV SOLN: Freq: Once | INTRAVENOUS | Status: AC

## 2022-07-13 NOTE — Progress Notes (Signed)
Per MD ok to treat today with echo from 04/06/22.  Pt scheduled for updated echo and verbalized understanding.

## 2022-07-13 NOTE — Assessment & Plan Note (Signed)
03/14/2022:Screening detected left breast masses 1.5 cm and 1.3 cm with enlarged left axillary lymph node, biopsy revealed grade 3 IDC with lymph node being positive, ER 95%, PR 0%, HER2 2+ by IHC and FISH positive with a ratio 2.45 and a copy #4.9, the second biopsy was HER2 negative with a ratio 2.15 and a copy #3.55  Treatment planbased on multidisciplinary tumor board: 1. Neoadjuvant chemotherapy withHerceptin Perjeta and letrozole for 3 cycles. Reassess with another MRI and then decide if she can continue with the same plan or do we need to add chemotherapy. (Our original recommendation is TCHP x6 cycles but patient is not willing to go through chemo right away) 2. Followed by breast conserving surgery if possible with sentinel lymph node study 3. Followed by adjuvant radiation therapy if patient had lumpectomy 4. Adjuvant antiestrogen therapy with neratinib -------------------------------------------------------------------------------------------------------------------------- Current Treatment: Cycle4Herceptin and Perjeta CT CAP and Bone Scan: 04/16/22: No distant mets Toxicities: 1. Severe diarrhea that lasted 24 hours.  She did not take Imodium.  Instructed to take Imodium next time. 2. Nausea: Takes Compazine 3. Fatigue 4.Leukopenia: Improving 5.  Maculopapular rash: Seems to come on after each treatment but it seems to fade away.  Will monitor  Mammogram and ultrasound 06/20/2022: Interval decrease in the size of the mass from 1.3 cm to 1 cm.  Second mass measured 1.3 cm and noted 0.8 cm.  Left axillary ultrasound 1.1 cm lymph node previously was 0.6.  I discussed with the patient that the primary tumors in the breast have responded but the axillary lymph node has not.    We will conduct 3 more cycles of Herceptin and Perjeta and then repeat the mammogram and ultrasound.

## 2022-07-13 NOTE — Progress Notes (Signed)
OK to proceed w/ Ogivri today per Dr. Lindi Adie. May try to switch to a different biosimilar w/ upcoming tx - PA will be needed.  Kennith Center, Pharm.D., CPP 07/13/2022'@12'$ :29 PM

## 2022-07-13 NOTE — Progress Notes (Signed)
Spoke w/ PA and Mason Jim would be an approved biosimilar for this patient. Orders updated in the treatment plan.  Larene Beach, PharmD ;

## 2022-07-17 ENCOUNTER — Ambulatory Visit (HOSPITAL_COMMUNITY)
Admission: EM | Admit: 2022-07-17 | Discharge: 2022-07-17 | Disposition: A | Discharge status: Home / Self Care | Payer: BC Managed Care – PPO | Attending: Family Medicine | Admitting: Family Medicine

## 2022-07-17 DIAGNOSIS — S60414A Abrasion of right ring finger, initial encounter: Secondary | ICD-10-CM | POA: Diagnosis not present

## 2022-07-17 DIAGNOSIS — S60419A Abrasion of unspecified finger, initial encounter: Secondary | ICD-10-CM

## 2022-07-17 MED ORDER — SILVER NITRATE-POT NITRATE 75-25 % EX MISC
CUTANEOUS | Status: AC
  Filled 2022-07-17: qty 10

## 2022-07-17 NOTE — Discharge Instructions (Addendum)
Keep your finger elevated with ice on it for the next 24 to 48 hours  Clean the wound once daily with hydrogen peroxide or warm soapy water and put new antibiotic ointment on it.  For the most part I would keep it covered.  Call your oncologist about whether not you could get a tetanus booster with your current medications you are on.  Also call your primary just to see when the last time he had a tetanus was

## 2022-07-17 NOTE — ED Triage Notes (Signed)
Pt reports right ring finger injury this afternoon. Pt report she was putting up decor and her finger got caught between the reef and door.

## 2022-07-17 NOTE — ED Provider Notes (Signed)
[facility]    CSN: [id number] Arrival date & time: 07/17/22  1554      History   Chief Complaint Chief Complaint  Patient presents with   Finger Injury    HPI Caitlyn Torres is a 61 y.o. female.   HPI Here with a wound on her right ring finger.  She was putting up a wreath and got her finger pad of her right ring finger caught in between the parts of a strong magnet.  She is uncertain of her last tetanus.  She is receiving Herceptin and Ogivri for breast cancer.    Past Medical History:  Diagnosis Date   Cancer [facility])    Family history of breast cancer 05/15/2022   Family history of pancreatic cancer 05/15/2022   Family history of prostate cancer 05/15/2022    Patient Active Problem List   Diagnosis Date Noted   Genetic testing 06/01/2022   Family history of breast cancer 05/15/2022   Family history of pancreatic cancer 05/15/2022   Family history of prostate cancer 05/15/2022   Port-A-Cath in place 04/28/2022   Malignant neoplasm of upper-outer quadrant of left breast in female, estrogen receptor positive ([place]) 03/29/2022    Past Surgical History:  Procedure Laterality Date   CERVIX SURGERY     Laser removal of pre-cancer cells   LASIK Bilateral    PORTACATH PLACEMENT Right 04/20/2022   Procedure: PORT PLACEMENT;  Surgeon: Erroll Luna, MD;  Location: [facility];  Service: General;  Laterality: Right;    OB History   No obstetric history on file.      Home Medications    Prior to Admission medications   Medication Sig Start Date End Date Taking? Authorizing Provider  carboxymethylcellulose (REFRESH PLUS) 0.5 % SOLN 1 drop 3 (three) times daily as needed (irritation).    [provider]  cetirizine (ZYRTEC) 10 MG tablet Take 10 mg by mouth daily.    [provider]  dexamethasone (DECADRON) 4 MG tablet Take 1 tablet (4 mg total) by mouth daily. Take 1 tablet day before chemo and 1 tablet day after chemo with food Patient not  taking: Reported on 04/11/2022 03/29/22   Nicholas Lose, MD  EPINEPHrine 0.3 mg/0.3 mL IJ SOAJ injection Inject 0.3 mg into the muscle as needed for anaphylaxis. Prn 01/25/22   [provider]  estradiol (ESTRACE VAGINAL) 0.1 MG/GM vaginal cream Place 1 Applicatorful vaginally once a week. 07/13/22   Nicholas Lose, MD  letrozole [facility]) 2.5 MG tablet Take 1 tablet (2.5 mg total) by mouth daily. 04/28/22   Gardenia Phlegm, NP  lidocaine-prilocaine (EMLA) cream Apply to affected area once Patient not taking: Reported on 04/11/2022 03/29/22   Nicholas Lose, MD  magic mouthwash w/lidocaine SOLN Take 5 mLs by mouth 4 (four) times daily as needed for mouth pain. 05/09/22   Nicholas Lose, MD  olopatadine (PATANOL) 0.1 % ophthalmic solution Place 1 drop into both eyes daily as needed for allergies.    [provider]  ondansetron (ZOFRAN) 8 MG tablet Take 1 tablet (8 mg total) by mouth 2 (two) times daily as needed (Nausea or vomiting). Start on the third day after chemotherapy. Patient not taking: Reported on 04/11/2022 03/29/22   Nicholas Lose, MD  prochlorperazine (COMPAZINE) 10 MG tablet Take 1 tablet (10 mg total) by mouth every 6 (six) hours as needed (Nausea or vomiting). Patient not taking: Reported on 04/11/2022 03/29/22   Nicholas Lose, MD    Family History Family History  Problem Relation Age of Onset   Prostate cancer Father        dx after 66   Breast cancer Sister 61       neg GT   Prostate cancer Maternal Grandfather        d. late 72s   Pancreatic cancer Paternal Grandmother        d. 91s   Parkinson's disease Paternal Grandmother    Bladder Cancer Paternal Grandfather        dx after 78   Breast cancer Cousin        paternal female cousin; dx 45s    Social History Social History   Tobacco Use   Smoking status: Former    Types: Cigarettes   Smokeless tobacco: Never  Scientific laboratory technician Use: Never used  Substance Use Topics   Alcohol use: Not Currently    Drug use: Never     Allergies   Aleve [naproxen sodium], Aspirin, Macrobid [nitrofurantoin], Motrin [ibuprofen], and Tylenol [acetaminophen]   Review of Systems Review of Systems   Physical Exam Triage Vital Signs ED Triage Vitals [07/17/22 1734]  Enc Vitals Group     BP (!) 141/83     Pulse Rate 98     Resp 18     Temp 98.5 F (36.9 C)     Temp Source Oral     SpO2 98 %     Weight      Height      Head Circumference      Peak Flow      Pain Score      Pain Loc      Pain Edu?      Excl. in [place]?    No data found.  Updated Vital Signs BP (!) 141/83 (BP Location: Left Arm)   Pulse 98   Temp 98.5 F (36.9 C) (Oral)   Resp 18   LMP 09/25/2019   SpO2 98%   Visual Acuity Right Eye Distance:   Left Eye Distance:   Bilateral Distance:    Right Eye Near:   Left Eye Near:    Bilateral Near:     Physical Exam Vitals reviewed.  Constitutional:      General: She is not in acute distress.    Appearance: She is not ill-appearing, toxic-appearing or diaphoretic.  Skin:    Comments: There is a full-thickness abrasion that is about a centimeter in diameter on the pad of her right ring finger pad.  There is a spot that still wanting to ooze.  Neurological:     Mental Status: She is alert and oriented to person, place, and time.  Psychiatric:        Behavior: Behavior normal.      UC Treatments / Results  Labs (all labs ordered are listed, but only abnormal results are displayed) Labs Reviewed - No data to display  EKG   Radiology No results found.  Procedures Procedures (including critical care time)  Medications Ordered in UC Medications - No data to display  Initial Impression / Assessment and Plan / UC Course  I have reviewed the triage vital signs and the nursing notes.  Pertinent labs & imaging results that were available during my care of the patient were reviewed by me and considered in my medical decision making (see chart for details).      Silver nitrate stick used for cautery.  It did burn but provided good results.  Then pressure bandage was applied  after bacitracin.  We discussed a tetanus booster, and she really does not want to do it at this point.  I have asked her to contact her oncology and her PCP tomorrow.  For 1 to find out if she has had a tetanus in the last 5 years, and also to find out if they think there are any interactions between her Tdap and her Herceptin treatments.  I could not find any interactions and up-to-date reference, but it did not take Tdap as a medication to search for.  She has some hydrocodone at home that she plans to take as needed for pain relief.  She will elevate and ice the finger.  Wound care was explained Final Clinical Impressions(s) / UC Diagnoses   Final diagnoses:  Abrasion, finger w/o infection     Discharge Instructions      Keep your finger elevated with ice on it for the next 24 to 48 hours  Clean the wound once daily with hydrogen peroxide or warm soapy water and put new antibiotic ointment on it.  For the most part I would keep it covered.  Call your oncologist about whether not you could get a tetanus booster with your current medications you are on.  Also call your primary just to see when the last time he had a tetanus was      ED Prescriptions   None    PDMP not reviewed this encounter.   Barrett Henle, MD 07/17/22 2012

## 2022-07-19 ENCOUNTER — Other Ambulatory Visit: Payer: Self-pay

## 2022-07-19 ENCOUNTER — Other Ambulatory Visit: Payer: Self-pay | Admitting: Hematology and Oncology

## 2022-07-19 DIAGNOSIS — C50412 Malignant neoplasm of upper-outer quadrant of left female breast: Secondary | ICD-10-CM

## 2022-07-20 ENCOUNTER — Other Ambulatory Visit: Payer: Self-pay

## 2022-07-24 ENCOUNTER — Telehealth: Payer: Self-pay | Admitting: *Deleted

## 2022-07-24 ENCOUNTER — Other Ambulatory Visit: Payer: Self-pay | Admitting: *Deleted

## 2022-07-24 MED ORDER — AMOXICILLIN-POT CLAVULANATE 875-125 MG PO TABS
1.0000 | ORAL_TABLET | Freq: Two times a day (BID) | ORAL | 0 refills | Status: DC
Start: 2022-07-24 — End: 2022-08-14

## 2022-07-24 NOTE — Telephone Encounter (Signed)
Pt called with c/o right ring finger infection. Informed on 9/4 "tip of ring finger was taken off". Feels like finger is infected. Red, hot, swollen and warm to touch. No fever nor purulent drainage. Redness is traveling to the front of finger. Pt in questions if abx should be started. Msg sent to Dr. Lindi Adie and nurse. Pt request abx be sent to [facility].

## 2022-07-24 NOTE — Progress Notes (Signed)
Received call from pt with complaint of infect right ring finger.  Pt states over Labor Day weekend she had the tip of her finger taken off by "strong magnets".  Pt states she has been treating wound at home with band aids and neosporin with no relief.  States finger is red and swollen but denies purulent drainage at this time.  Per MD pt needing to start Augmentin 875 mg p.o BID x7 days.  Prescription sent to pharmacy on file.  Pt educated and verbalized understanding.

## 2022-07-25 ENCOUNTER — Other Ambulatory Visit (HOSPITAL_COMMUNITY): Payer: BC Managed Care – PPO

## 2022-07-29 ENCOUNTER — Other Ambulatory Visit: Payer: Self-pay

## 2022-07-31 ENCOUNTER — Ambulatory Visit (HOSPITAL_COMMUNITY)
Admission: RE | Admit: 2022-07-31 | Discharge: 2022-07-31 | Disposition: A | Discharge status: Home / Self Care | Payer: BC Managed Care – PPO | Source: Ambulatory Visit | Attending: Hematology and Oncology | Admitting: Hematology and Oncology

## 2022-07-31 DIAGNOSIS — Z79899 Other long term (current) drug therapy: Secondary | ICD-10-CM | POA: Diagnosis not present

## 2022-07-31 DIAGNOSIS — Z5181 Encounter for therapeutic drug level monitoring: Secondary | ICD-10-CM

## 2022-07-31 DIAGNOSIS — Z170 Estrogen receptor positive status [ER+]: Secondary | ICD-10-CM | POA: Insufficient documentation

## 2022-07-31 DIAGNOSIS — Z0189 Encounter for other specified special examinations: Secondary | ICD-10-CM

## 2022-07-31 DIAGNOSIS — C50412 Malignant neoplasm of upper-outer quadrant of left female breast: Secondary | ICD-10-CM | POA: Diagnosis not present

## 2022-07-31 DIAGNOSIS — Z01818 Encounter for other preprocedural examination: Secondary | ICD-10-CM | POA: Diagnosis present

## 2022-07-31 LAB — ECHOCARDIOGRAM COMPLETE
AR max vel: 2.29 cm2
AV Peak grad: 4 mmHg
Ao pk vel: 1.01 m/s
Area-P 1/2: 4.21 cm2
S' Lateral: 3.6 cm

## 2022-08-03 ENCOUNTER — Inpatient Hospital Stay: Payer: BC Managed Care – PPO | Attending: Hematology and Oncology

## 2022-08-03 ENCOUNTER — Other Ambulatory Visit: Payer: Self-pay

## 2022-08-03 VITALS — BP 136/75 | HR 63 | Temp 97.8°F | Resp 16 | Ht 63.0 in | Wt 118.2 lb

## 2022-08-03 DIAGNOSIS — C50412 Malignant neoplasm of upper-outer quadrant of left female breast: Secondary | ICD-10-CM | POA: Insufficient documentation

## 2022-08-03 DIAGNOSIS — Z5112 Encounter for antineoplastic immunotherapy: Secondary | ICD-10-CM | POA: Insufficient documentation

## 2022-08-03 DIAGNOSIS — Z170 Estrogen receptor positive status [ER+]: Secondary | ICD-10-CM

## 2022-08-03 LAB — CBC WITH DIFFERENTIAL/PLATELET
Abs Immature Granulocytes: 0.01 10*3/uL (ref 0.00–0.07)
Basophils Absolute: 0 10*3/uL (ref 0.0–0.1)
Basophils Relative: 1 %
Eosinophils Absolute: 0.2 10*3/uL (ref 0.0–0.5)
Eosinophils Relative: 6 %
HCT: 38.5 % (ref 36.0–46.0)
Hemoglobin: 13.3 g/dL (ref 12.0–15.0)
Immature Granulocytes: 0 %
Lymphocytes Relative: 26 %
Lymphs Abs: 0.8 10*3/uL (ref 0.7–4.0)
MCH: 29.5 pg (ref 26.0–34.0)
MCHC: 34.5 g/dL (ref 30.0–36.0)
MCV: 85.4 fL (ref 80.0–100.0)
Monocytes Absolute: 0.4 10*3/uL (ref 0.1–1.0)
Monocytes Relative: 12 %
Neutro Abs: 1.7 10*3/uL (ref 1.7–7.7)
Neutrophils Relative %: 55 %
Platelets: 169 10*3/uL (ref 150–400)
RBC: 4.51 MIL/uL (ref 3.87–5.11)
RDW: 14.2 % (ref 11.5–15.5)
WBC: 3.1 10*3/uL — ABNORMAL LOW (ref 4.0–10.5)
nRBC: 0 % (ref 0.0–0.2)

## 2022-08-03 MED ORDER — ACETAMINOPHEN 325 MG PO TABS: 650.0000 mg | ORAL_TABLET | Freq: Once | ORAL | Status: DC

## 2022-08-03 MED ORDER — TRASTUZUMAB-ANNS CHEMO 150 MG IV SOLR
6.0000 mg/kg | Freq: Once | INTRAVENOUS | Status: AC
  Administered 2022-08-03: 315 mg via INTRAVENOUS
  Filled 2022-08-03: qty 15

## 2022-08-03 MED ORDER — SODIUM CHLORIDE 0.9 % IV SOLN
420.0000 mg | Freq: Once | INTRAVENOUS | Status: AC
  Administered 2022-08-03: 420 mg via INTRAVENOUS
  Filled 2022-08-03: qty 14

## 2022-08-03 MED ORDER — HEPARIN SOD (PORK) LOCK FLUSH 100 UNIT/ML IV SOLN
500.0000 [IU] | Freq: Once | INTRAVENOUS | Status: AC | PRN
  Administered 2022-08-03: 500 [IU]

## 2022-08-03 MED ORDER — SODIUM CHLORIDE 0.9 % IV SOLN: Freq: Once | INTRAVENOUS | Status: AC

## 2022-08-03 MED ORDER — DIPHENHYDRAMINE HCL 25 MG PO CAPS
25.0000 mg | ORAL_CAPSULE | Freq: Once | ORAL | Status: AC
  Administered 2022-08-03: 25 mg via ORAL
  Filled 2022-08-03: qty 1

## 2022-08-03 MED ORDER — SODIUM CHLORIDE 0.9% FLUSH
10.0000 mL | INTRAVENOUS | Status: DC | PRN
  Administered 2022-08-03: 10 mL

## 2022-08-03 NOTE — Patient Instructions (Signed)
[facility] MEDICAL ONCOLOGY  Discharge Instructions: Thank you for choosing [facility] to provide your oncology and hematology care.   If you have a lab appointment with the Cancer Center, please go directly to the Cancer Center and check in at the registration area.   Wear comfortable clothing and clothing appropriate for easy access to any Portacath or PICC line.   We strive to give you quality time with your provider. You may need to reschedule your appointment if you arrive late (15 or more minutes).  Arriving late affects you and other patients whose appointments are after yours.  Also, if you miss three or more appointments without notifying the office, you may be dismissed from the clinic at the provider's discretion.      For prescription refill requests, have your pharmacy contact our office and allow 72 hours for refills to be completed.    Today you received the following chemotherapy and/or immunotherapy agents: Trastuzumab (Kanjinti) and Pertuzumab (Perjeta)   To help prevent nausea and vomiting after your treatment, we encourage you to take your nausea medication as directed.  BELOW ARE SYMPTOMS THAT SHOULD BE REPORTED IMMEDIATELY: *FEVER GREATER THAN 100.4 F (38 C) OR HIGHER *CHILLS OR SWEATING *NAUSEA AND VOMITING THAT IS NOT CONTROLLED WITH YOUR NAUSEA MEDICATION *UNUSUAL SHORTNESS OF BREATH *UNUSUAL BRUISING OR BLEEDING *URINARY PROBLEMS (pain or burning when urinating, or frequent urination) *BOWEL PROBLEMS (unusual diarrhea, constipation, pain near the anus) TENDERNESS IN MOUTH AND THROAT WITH OR WITHOUT PRESENCE OF ULCERS (sore throat, sores in mouth, or a toothache) UNUSUAL RASH, SWELLING OR PAIN  UNUSUAL VAGINAL DISCHARGE OR ITCHING   Items with * indicate a potential emergency and should be followed up as soon as possible or go to the Emergency Department if any problems should occur.  Please show the CHEMOTHERAPY ALERT CARD or  IMMUNOTHERAPY ALERT CARD at check-in to the Emergency Department and triage nurse.  Should you have questions after your visit or need to cancel or reschedule your appointment, please contact [facility]  Dept: [phone]  and follow the prompts.  Office hours are 8:00 a.m. to 4:30 p.m. Monday - Friday. Please note that voicemails left after 4:00 p.m. may not be returned until the following business day.  We are closed weekends and major holidays. You have access to a nurse at all times for urgent questions. Please call the main number to the clinic Dept: [phone] and follow the prompts.   For any non-urgent questions, you may also contact your provider using MyChart. We now offer e-Visits for anyone 18 and older to request care online for non-urgent symptoms. For details visit [email or web address].   Also download the MyChart app! Go to the app store, search "MyChart", open the app, select , and log in with your MyChart username and password.  Masks are optional in the cancer centers. If you would like for your care team to wear a mask while they are taking care of you, please let them know. You may have one support person who is at least 59 years old accompany you for your appointments. Trastuzumab Injection What is this medication? TRASTUZUMAB (tras TOO zoo mab) treats breast cancer and stomach cancer. It works by blocking a protein that causes cancer cells to grow and multiply. This helps to slow or stop the spread of cancer cells. This medicine may be used for other purposes; ask your health care provider or pharmacist if you have questions.   COMMON BRAND NAME(S): Herceptin, Herzuma, KANJINTI, Ogivri, Ontruzant, Trazimera What should I tell my care team before I take this medication? They need to know if you have any of these conditions: Heart failure Lung disease An unusual or allergic reaction to trastuzumab, other medications, foods, dyes,  or preservatives Pregnant or trying to get pregnant Breast-feeding How should I use this medication? This medication is injected into a vein. It is given by your care team in a hospital or clinic setting. Talk to your care team about the use of this medication in children. It is not approved for use in children. Overdosage: If you think you have taken too much of this medicine contact a poison control center or emergency room at once. NOTE: This medicine is only for you. Do not share this medicine with others. What if I miss a dose? Keep appointments for follow-up doses. It is important not to miss your dose. Call your care team if you are unable to keep an appointment. What may interact with this medication? Certain types of chemotherapy, such as daunorubicin, doxorubicin, epirubicin, idarubicin This list may not describe all possible interactions. Give your health care provider a list of all the medicines, herbs, non-prescription drugs, or dietary supplements you use. Also tell them if you smoke, drink alcohol, or use illegal drugs. Some items may interact with your medicine. What should I watch for while using this medication? Your condition will be monitored carefully while you are receiving this medication. This medication may make you feel generally unwell. This is not uncommon, as chemotherapy affects healthy cells as well as cancer cells. Report any side effects. Continue your course of treatment even though you feel ill unless your care team tells you to stop. This medication may increase your risk of getting an infection. Call your care team for advice if you get a fever, chills, sore throat, or other symptoms of a cold or flu. Do not treat yourself. Try to avoid being around people who are sick. Avoid taking medications that contain aspirin, acetaminophen, ibuprofen, naproxen, or ketoprofen unless instructed by your care team. These medications can hide a fever. Talk to your care team if  you may be pregnant. Serious birth defects can occur if you take this medication during pregnancy and for 7 months after the last dose. You will need a negative pregnancy test before starting this medication. Contraception is recommended while taking this medication and for 7 months after the last dose. Your care team can help you find the option that works for you. Do not breastfeed while taking this medication and for 7 months after stopping treatment. What side effects may I notice from receiving this medication? Side effects that you should report to your care team as soon as possible: Allergic reactions or angioedema--skin rash, itching or hives, swelling of the face, eyes, lips, tongue, arms, or legs, trouble swallowing or breathing Dry cough, shortness of breath or trouble breathing Heart failure--shortness of breath, swelling of the ankles, feet, or hands, sudden weight gain, unusual weakness or fatigue Infection--fever, chills, cough, or sore throat Infusion reactions--chest pain, shortness of breath or trouble breathing, feeling faint or lightheaded Side effects that usually do not require medical attention (report to your care team if they continue or are bothersome): Diarrhea Dizziness Headache Nausea Trouble sleeping Vomiting This list may not describe all possible side effects. Call your doctor for medical advice about side effects. You may report side effects to FDA at 1-800-FDA-1088. Where should I keep my   medication? This medication is given in a hospital or clinic. It will not be stored at home. NOTE: This sheet is a summary. It may not cover all possible information. If you have questions about this medicine, talk to your doctor, pharmacist, or health care provider.  2023 Elsevier/Gold Standard (2022-03-14 00:00:00) Pertuzumab Injection What is this medication? PERTUZUMAB (per TOOZ ue mab) treats breast cancer. It works by blocking a protein that causes cancer cells to grow  and multiply. This helps to slow or stop the spread of cancer cells. It is a monoclonal antibody. This medicine may be used for other purposes; ask your health care provider or pharmacist if you have questions. COMMON BRAND NAME(S): PERJETA What should I tell my care team before I take this medication? They need to know if you have any of these conditions: Heart failure An unusual or allergic reaction to pertuzumab, other medications, foods, dyes, or preservatives Pregnant or trying to get pregnant Breast-feeding How should I use this medication? This medication is injected into a vein. It is given by your care team in a hospital or clinic setting. Talk to your care team about the use of this medication in children. Special care may be needed. Overdosage: If you think you have taken too much of this medicine contact a poison control center or emergency room at once. NOTE: This medicine is only for you. Do not share this medicine with others. What if I miss a dose? Keep appointments for follow-up doses. It is important not to miss your dose. Call your care team if you are unable to keep an appointment. What may interact with this medication? Interactions are not expected. This list may not describe all possible interactions. Give your health care provider a list of all the medicines, herbs, non-prescription drugs, or dietary supplements you use. Also tell them if you smoke, drink alcohol, or use illegal drugs. Some items may interact with your medicine. What should I watch for while using this medication? Your condition will be monitored carefully while you are receiving this medication. This medication may make you feel generally unwell. This is not uncommon as chemotherapy can affect healthy cells as well as cancer cells. Report any side effects. Continue your course of treatment even though you feel ill unless your care team tells you to stop. Talk to your care team if you may be pregnant.  Serious birth defects can occur if you take this medication during pregnancy and for 7 months after the last dose. You will need a negative pregnancy test before starting this medication. Contraception is recommended while taking this medication and for 7 months after the last dose. Your care team can help you find the option that works for you. Do not breastfeed while taking this medication and for 7 months after the last dose. What side effects may I notice from receiving this medication? Side effects that you should report to your care team as soon as possible: Allergic reactions or angioedema--skin rash, itching or hives, swelling of the face, eyes, lips, tongue, arms, or legs, trouble swallowing or breathing Heart failure--shortness of breath, swelling of the ankles, feet, or hands, sudden weight gain, unusual weakness or fatigue Infusion reactions--chest pain, shortness of breath or trouble breathing, feeling faint or lightheaded Side effects that usually do not require medical attention (report to your care team if they continue or are bothersome): Diarrhea Dry skin Fatigue Hair loss Nausea Vomiting This list may not describe all possible side effects. Call your doctor   for medical advice about side effects. You may report side effects to FDA at 1-800-FDA-1088. Where should I keep my medication? This medication is given in a hospital or clinic. It will not be stored at home. NOTE: This sheet is a summary. It may not cover all possible information. If you have questions about this medicine, talk to your doctor, pharmacist, or health care provider.  2023 Elsevier/Gold Standard (2022-03-02 00:00:00) 

## 2022-08-03 NOTE — Progress Notes (Signed)
Per Dr. Lindi Adie, ok for treatment today with ECHO from 04/13/22.

## 2022-08-04 ENCOUNTER — Inpatient Hospital Stay: Payer: BC Managed Care – PPO | Admitting: Hematology and Oncology

## 2022-08-04 ENCOUNTER — Inpatient Hospital Stay: Payer: BC Managed Care – PPO

## 2022-08-04 ENCOUNTER — Encounter: Payer: Self-pay | Admitting: *Deleted

## 2022-08-10 ENCOUNTER — Ambulatory Visit: Payer: BC Managed Care – PPO | Admitting: Nurse Practitioner

## 2022-08-14 ENCOUNTER — Ambulatory Visit (INDEPENDENT_AMBULATORY_CARE_PROVIDER_SITE_OTHER): Payer: BC Managed Care – PPO | Admitting: Nurse Practitioner

## 2022-08-14 ENCOUNTER — Encounter: Payer: Self-pay | Admitting: Nurse Practitioner

## 2022-08-14 VITALS — BP 137/84 | HR 65 | Ht 63.0 in | Wt 117.4 lb

## 2022-08-14 DIAGNOSIS — C50412 Malignant neoplasm of upper-outer quadrant of left female breast: Secondary | ICD-10-CM

## 2022-08-14 DIAGNOSIS — Z7689 Persons encountering health services in other specified circumstances: Secondary | ICD-10-CM | POA: Diagnosis not present

## 2022-08-14 DIAGNOSIS — R5383 Other fatigue: Secondary | ICD-10-CM | POA: Diagnosis not present

## 2022-08-14 DIAGNOSIS — Z170 Estrogen receptor positive status [ER+]: Secondary | ICD-10-CM

## 2022-08-14 DIAGNOSIS — R52 Pain, unspecified: Secondary | ICD-10-CM | POA: Diagnosis not present

## 2022-08-14 MED ORDER — TRAMADOL HCL 50 MG PO TABS: 50.0000 mg | ORAL_TABLET | Freq: Three times a day (TID) | ORAL | 0 refills | Status: AC | PRN

## 2022-08-14 NOTE — Progress Notes (Signed)
New Patient Office Visit  Subjective    Patient ID: Caitlyn Torres, female    DOB: Oct 17, 1961  Age: 61 y.o. MRN: [id number]  CC:  Chief Complaint  Patient presents with   New Patient (Initial Visit)    HPI Caitlyn Torres presents to establish care She has recently relocated from [place].  Currently being treated at [facility] center for breast cancer.  Currently doing immunotherapy first. Further treatment plans to be discussed at a later date.  Does make her feel tired.  GYN provider is here in [place].  Last seen by GYN in spring. This is usually when routine labs are checked.  She does not have any current concerns or complaints.    Outpatient Encounter Medications as of 08/14/2022  Medication Sig   estradiol (ESTRACE VAGINAL) 0.1 MG/GM vaginal cream Place 1 Applicatorful vaginally once a week.   letrozole (FEMARA) 2.5 MG tablet Take 1 tablet (2.5 mg total) by mouth daily.   magic mouthwash w/lidocaine SOLN Take 5 mLs by mouth 4 (four) times daily as needed for mouth pain.   olopatadine (PATANOL) 0.1 % ophthalmic solution Place 1 drop into both eyes daily as needed for allergies.   [EXPIRED] traMADol (ULTRAM) 50 MG tablet Take 1 tablet (50 mg total) by mouth every 8 (eight) hours as needed for up to 5 days.   cetirizine (ZYRTEC) 10 MG tablet Take 10 mg by mouth daily. (Patient not taking: Reported on 08/03/2022)   EPINEPHrine 0.3 mg/0.3 mL IJ SOAJ injection Inject 0.3 mg into the muscle as needed for anaphylaxis. Prn (Patient not taking: Reported on 08/14/2022)   [DISCONTINUED] amoxicillin-clavulanate (AUGMENTIN) 875-125 MG tablet Take 1 tablet by mouth 2 (two) times daily. (Patient not taking: Reported on 08/03/2022)   [DISCONTINUED] carboxymethylcellulose (REFRESH PLUS) 0.5 % SOLN 1 drop 3 (three) times daily as needed (irritation).   [DISCONTINUED] prochlorperazine (COMPAZINE) 10 MG tablet Take 1 tablet (10 mg total) by mouth every 6 (six) hours as  needed (Nausea or vomiting). (Patient not taking: Reported on 04/11/2022)   No facility-administered encounter medications on file as of 08/14/2022.    Past Medical History:  Diagnosis Date   Cancer [facility])    Family history of breast cancer 05/15/2022   Family history of pancreatic cancer 05/15/2022   Family history of prostate cancer 05/15/2022    Past Surgical History:  Procedure Laterality Date   CERVIX SURGERY     Laser removal of pre-cancer cells   LASIK Bilateral    PORTACATH PLACEMENT Right 04/20/2022   Procedure: PORT PLACEMENT;  Surgeon: Erroll Luna, MD;  Location: [facility];  Service: General;  Laterality: Right;    Family History  Problem Relation Age of Onset   Prostate cancer Father        dx after 70   Breast cancer Sister 26       neg GT   Prostate cancer Maternal Grandfather        d. late 35s   Pancreatic cancer Paternal Grandmother        d. 23s   Parkinson's disease Paternal Grandmother    Bladder Cancer Paternal Grandfather        dx after 50   Breast cancer Cousin        paternal female cousin; dx 22s    Social History   Socioeconomic History   Marital status: Married    Spouse name: Not on file   Number of children: 1   Years of education:  Not on file   Highest education level: Not on file  Occupational History   Not on file  Tobacco Use   Smoking status: Former    Types: Cigarettes   Smokeless tobacco: Never  Vaping Use   Vaping Use: Never used  Substance and Sexual Activity   Alcohol use: Not Currently   Drug use: Never   Sexual activity: Not on file  Other Topics Concern   Not on file  Social History Narrative   Not on file   Social Determinants of Health   Financial Resource Strain: Low Risk  (04/04/2022)   Overall Financial Resource Strain (CARDIA)    Difficulty of Paying Living Expenses: Not very hard  Food Insecurity: No Food Insecurity (04/04/2022)   Hunger Vital Sign    Worried About Running Out of Food in the Last Year: Never  true    Ran Out of Food in the Last Year: Never true  Transportation Needs: No Transportation Needs (04/04/2022)   PRAPARE - Hydrologist (Medical): No    Lack of Transportation (Non-Medical): No  Physical Activity: Not on file  Stress: Not on file  Social Connections: Not on file  Intimate Partner Violence: Not At Risk (04/11/2022)   Humiliation, Afraid, Rape, and Kick questionnaire    Torres of Current or Ex-Partner: No    Emotionally Abused: No    Physically Abused: No    Sexually Abused: No    Review of Systems  Constitutional:  Positive for malaise/fatigue. Negative for chills and fever.  HENT:  Negative for congestion, sinus pain and sore throat.   Eyes: Negative.   Respiratory:  Negative for cough, shortness of breath and wheezing.   Cardiovascular:  Negative for chest pain, palpitations and leg swelling.  Gastrointestinal:  Negative for constipation, diarrhea, nausea and vomiting.  Genitourinary: Negative.   Musculoskeletal:  Negative for myalgias.  Skin: Negative.   Neurological:  Negative for dizziness and headaches.  Endo/Heme/Allergies:  Does not bruise/bleed easily.  Psychiatric/Behavioral:  Negative for depression. The patient is not nervous/anxious.         Objective    Today's Vitals   08/14/22 1548  BP: 137/84  Pulse: 65  SpO2: 98%  Weight: 117 lb 6.4 oz (53.3 kg)  Height: '5\' 3"'$  (1.6 m)   Body mass index is 20.8 kg/m.   Physical Exam Vitals and nursing note reviewed.  Constitutional:      Appearance: Normal appearance. She is well-developed.  HENT:     Head: Normocephalic and atraumatic.     Nose: Nose normal.     Mouth/Throat:     Mouth: Mucous membranes are moist.     Pharynx: Oropharynx is clear.  Eyes:     Extraocular Movements: Extraocular movements intact.     Conjunctiva/sclera: Conjunctivae normal.     Pupils: Pupils are equal, round, and reactive to light.  Cardiovascular:     Rate and Rhythm: Normal rate  and regular rhythm.     Pulses: Normal pulses.     Heart sounds: Normal heart sounds.  Pulmonary:     Effort: Pulmonary effort is normal.     Breath sounds: Normal breath sounds.  Abdominal:     Palpations: Abdomen is soft.  Musculoskeletal:        General: Normal range of motion.     Cervical back: Normal range of motion and neck supple.  Lymphadenopathy:     Cervical: No cervical adenopathy.  Skin:    General:  Skin is warm and dry.     Capillary Refill: Capillary refill takes less than 2 seconds.  Neurological:     General: No focal deficit present.     Mental Status: She is alert and oriented to person, place, and time.  Psychiatric:        Mood and Affect: Mood normal.        Behavior: Behavior normal.        Thought Content: Thought content normal.        Judgment: Judgment normal.      Assessment & Plan:  1. Other fatigue Fatigue due to immunotherapy for breast cancer treatment.  2. Acute pain Intermittent pain from Port-A-Cath placement.  Short-term prescription for tramadol 50 mg sent to her pharmacy.  This may be taken up to 3 times daily if needed for acute pain.  Advised patient to use with caution.  She should not drive or make legal decisions after taking his medication as it could cause dizziness and drowsiness.  She voiced understanding and agreement.  Prescription for 15 tablets sent to her pharmacy. - traMADol (ULTRAM) 50 MG tablet; Take 1 tablet (50 mg total) by mouth every 8 (eight) hours as needed for up to 5 days.  Dispense: 15 tablet; Refill: 0  3. Malignant neoplasm of upper-outer quadrant of left breast in female, estrogen receptor positive ([place]) Patient being treated for breast cancer at [facility].  4. Encounter to establish care Appointment today to establish new primary care provider      Problem List Items Addressed This Visit       Other   Malignant neoplasm of upper-outer quadrant of left breast in female, estrogen receptor  positive ([place])   Other fatigue - Primary   Acute pain   Other Visit Diagnoses     Encounter to establish care           Return in about 6 months (around 02/13/2023) for health maintenance exam.   Ronnell Freshwater, NP

## 2022-08-15 ENCOUNTER — Other Ambulatory Visit: Payer: Self-pay

## 2022-08-20 NOTE — Progress Notes (Signed)
Patient Care Team: Bradd Canary, MD as PCP - General (Family Medicine) Mauro Kaufmann, RN as Oncology Nurse Navigator Rockwell Germany, RN as Oncology Nurse Navigator Nicholas Lose, MD as Consulting Physician (Hematology and Oncology)  DIAGNOSIS:  Encounter Diagnosis  Name Primary?   Malignant neoplasm of upper-outer quadrant of left breast in female, estrogen receptor positive ([place])     SUMMARY OF ONCOLOGIC HISTORY: Oncology History  Malignant neoplasm of upper-outer quadrant of left breast in female, estrogen receptor positive ([place])  03/14/2022 Initial Diagnosis   Screening detected left breast masses 1.5 cm and 1.3 cm with enlarged left axillary lymph node, biopsy revealed grade 3 IDC with lymph node being positive, ER 95%, PR 0%, HER2 2+ by IHC and FISH positive with a ratio 2.45 and a copy #4.9, the second biopsy was HER2 negative with a ratio 2.15 and a copy #3.55   03/29/2022 Cancer Staging   Staging form: Breast, AJCC 8th Edition - Clinical: Stage IIA (cT1c, cN1, cM0, G3, ER+, PR-, HER2+) - Signed by Nicholas Lose, MD on 03/29/2022 Stage prefix: Initial diagnosis Histologic grading system: 3 grade system   03/29/2022 -  Anti-estrogen oral therapy   Letrozole daily   04/21/2022 -  Chemotherapy   Herceptin/Perjeta every 21 days initially x 3, will reassess with MRI and then will decide whether to continue or to add chemo.  (Original recommendation was TCHP x 6)      Genetic Testing   [facility] CancerNext-Expanded Panel was Negative. Report date is 05/30/2022.  The CancerNext-Expanded gene panel offered by [facility] and includes sequencing, rearrangement, and RNA analysis for the following 77 genes: AIP, ALK, APC, ATM, AXIN2, BAP1, BARD1, BLM, BMPR1A, BRCA1, BRCA2, BRIP1, CDC73, CDH1, CDK4, CDKN1B, CDKN2A, CHEK2, CTNNA1, DICER1, FANCC, FH, FLCN, GALNT12, KIF1B, LZTR1, MAX, MEN1, MET, MLH1, MSH2, MSH3, MSH6, MUTYH, NBN, NF1, NF2, NTHL1, PALB2, PHOX2B, PMS2, POT1, PRKAR1A,  PTCH1, PTEN, RAD51C, RAD51D, RB1, RECQL, RET, SDHA, SDHAF2, SDHB, SDHC, SDHD, SMAD4, SMARCA4, SMARCB1, SMARCE1, STK11, SUFU, TMEM127, TP53, TSC1, TSC2, VHL and XRCC2 (sequencing and deletion/duplication); EGFR, EGLN1, HOXB13, KIT, MITF, PDGFRA, POLD1, and POLE (sequencing only); EPCAM and GREM1 (deletion/duplication only).    04/21/2022 -  Chemotherapy   Patient is on Treatment Plan : BREAST Trastuzumab  + Pertuzumab q21d x 13 cycles       CHIEF COMPLIANT: follow-up Herceptin and Perjeta  INTERVAL HISTORY: Caitlyn Torres is a 61 year old above-mentioned history of HER2 positive breast cancer was currently on Herceptin and Perjeta. She presents to the clinic for a follow-up. She states that her skin was really bad after last treatment but reports it has got better as of today.  She had an ultrasound of the breast and is here today to discuss results.   ALLERGIES:  is allergic to aleve [naproxen sodium], aspirin, macrobid [nitrofurantoin], motrin [ibuprofen], and tylenol [acetaminophen].  MEDICATIONS:  Current Outpatient Medications  Medication Sig Dispense Refill   cetirizine (ZYRTEC) 10 MG tablet Take 10 mg by mouth daily. (Patient not taking: Reported on 08/03/2022)     EPINEPHrine 0.3 mg/0.3 mL IJ SOAJ injection Inject 0.3 mg into the muscle as needed for anaphylaxis. Prn (Patient not taking: Reported on 08/14/2022)     estradiol (ESTRACE VAGINAL) 0.1 MG/GM vaginal cream Place 1 Applicatorful vaginally once a week. 42.5 g 12   letrozole (FEMARA) 2.5 MG tablet Take 1 tablet (2.5 mg total) by mouth daily. 90 tablet 3   magic mouthwash w/lidocaine SOLN Take 5 mLs by mouth 4 (  four) times daily as needed for mouth pain. 240 mL 1   olopatadine (PATANOL) 0.1 % ophthalmic solution Place 1 drop into both eyes daily as needed for allergies.     No current facility-administered medications for this visit.    PHYSICAL EXAMINATION: ECOG PERFORMANCE STATUS: 1 - Symptomatic but completely  ambulatory  Vitals:   08/24/22 0938  BP: (!) 145/61  Pulse: 68  Resp: 18  Temp: (!) 97.3 F (36.3 C)  SpO2: 99%   Filed Weights   08/24/22 0938  Weight: 119 lb (54 kg)      LABORATORY DATA:  I have reviewed the data as listed    Latest Ref Rng & Units 08/24/2022    9:17 AM 07/13/2022   11:38 AM 06/22/2022    9:33 AM  CMP  Glucose 70 - 99 mg/dL 82  96  94   BUN 6 - 20 mg/dL _0 Creatinine 0.44 - 1.00 mg/dL 0.56  0.55  0.55   Sodium 135 - 145 mmol/L 136  137  138   Potassium 3.5 - 5.1 mmol/L 4.0  4.1  3.8   Chloride 98 - 111 mmol/L 104  104  105   CO2 22 - 32 mmol/L _1 Calcium 8.9 - 10.3 mg/dL 9.2  9.6  9.1   Total Protein 6.5 - 8.1 g/dL 6.7  6.7  6.8   Total Bilirubin 0.3 - 1.2 mg/dL 0.3  0.4  0.3   Alkaline Phos 38 - 126 U/L 66  57  58   AST 15 - 41 U/L _2 ALT 0 - 44 U/L 33  33  25     Lab Results  Component Value Date   WBC 3.5 (L) 08/24/2022   HGB 13.7 08/24/2022   HCT 39.8 08/24/2022   MCV 85.2 08/24/2022   PLT 144 (L) 08/24/2022   NEUTROABS 1.7 08/24/2022    ASSESSMENT & PLAN:  Malignant neoplasm of upper-outer quadrant of left breast in female, estrogen receptor positive ([place]) 03/14/2022:Screening detected left breast masses 1.5 cm and 1.3 cm with enlarged left axillary lymph node, biopsy revealed grade 3 IDC with lymph node being positive, ER 95%, PR 0%, HER2 2+ by IHC and FISH positive with a ratio 2.45 and a copy #4.9, the second biopsy was HER2 negative with a ratio 2.15 and a copy #3.55   Treatment plan based on multidisciplinary tumor board: 1. Neoadjuvant chemotherapy with Herceptin Perjeta and letrozole for 3 cycles.  Reassess with another MRI and then decide if she can continue with the same plan or do we need to add chemotherapy. (Our original recommendation is TCHP x6 cycles but patient is not willing to go through chemo right away) 2. Followed by breast conserving surgery if possible with sentinel lymph node study 3.  Followed by adjuvant radiation therapy if patient had lumpectomy 4.  Adjuvant antiestrogen therapy with neratinib -------------------------------------------------------------------------------------------------------------------------- Current Treatment: Cycle 7 Herceptin and Perjeta CT CAP and Bone Scan: 04/16/22: No distant mets Toxicities: Maculopapular rash: Requested a different biosimilar: The rash appears to be coming in sooner but has also improved faster.  Mammogram and ultrasound 06/20/2022: Interval decrease in the size of the mass from 1.3 cm to 1 cm.  Second mass measured 1.3 cm and noted 0.8 cm.  Left axillary ultrasound 1.1 cm lymph node previously was 0.6.  08/22/2022: Mammogram and ultrasound: Biopsy-proven malignancy (stable.  2 o'clock position mass  is smaller.  There are now 2 abnormal lymph nodes in the left axilla 1 was previously biopsied  We are concerned about the additional lymph node metastases detected by ultrasound.  Although the primary tumor appears to be under stable and improved conditions because of the presence of additional lymph node I recommended that we use proceed to surgery instead.  She will think about the surgical option and will inform us of her decision. After surgery we might transition her from Herceptin Perjeta to [place].   No orders of the defined types were placed in this encounter.  The patient has a good understanding of the overall plan. she agrees with it. she will call with any problems that may develop before the next visit here. Total time spent: 30 mins including face to face time and time spent for planning, charting and co-ordination of care   Harriette Ohara, MD 08/24/22    I Gardiner Coins am scribing for Dr. Lindi Adie  I have reviewed the above documentation for accuracy and completeness, and I agree with the above.

## 2022-08-22 ENCOUNTER — Encounter: Payer: Self-pay | Admitting: *Deleted

## 2022-08-22 ENCOUNTER — Ambulatory Visit
Admission: RE | Admit: 2022-08-22 | Discharge: 2022-08-22 | Disposition: A | Discharge status: Home / Self Care | Payer: BC Managed Care – PPO | Source: Ambulatory Visit | Attending: Hematology and Oncology | Admitting: Hematology and Oncology

## 2022-08-22 DIAGNOSIS — Z170 Estrogen receptor positive status [ER+]: Secondary | ICD-10-CM

## 2022-08-23 ENCOUNTER — Other Ambulatory Visit: Payer: Self-pay | Admitting: *Deleted

## 2022-08-23 DIAGNOSIS — C50412 Malignant neoplasm of upper-outer quadrant of left female breast: Secondary | ICD-10-CM

## 2022-08-24 ENCOUNTER — Inpatient Hospital Stay: Payer: BC Managed Care – PPO

## 2022-08-24 ENCOUNTER — Other Ambulatory Visit: Payer: Self-pay | Admitting: Hematology and Oncology

## 2022-08-24 ENCOUNTER — Inpatient Hospital Stay: Payer: BC Managed Care – PPO | Attending: Hematology and Oncology | Admitting: Hematology and Oncology

## 2022-08-24 ENCOUNTER — Other Ambulatory Visit: Payer: BC Managed Care – PPO

## 2022-08-24 ENCOUNTER — Encounter: Payer: Self-pay | Admitting: *Deleted

## 2022-08-24 VITALS — BP 131/70 | HR 72 | Temp 98.7°F | Resp 18

## 2022-08-24 DIAGNOSIS — R21 Rash and other nonspecific skin eruption: Secondary | ICD-10-CM | POA: Diagnosis not present

## 2022-08-24 DIAGNOSIS — Z79811 Long term (current) use of aromatase inhibitors: Secondary | ICD-10-CM | POA: Insufficient documentation

## 2022-08-24 DIAGNOSIS — Z9221 Personal history of antineoplastic chemotherapy: Secondary | ICD-10-CM | POA: Diagnosis not present

## 2022-08-24 DIAGNOSIS — Z95828 Presence of other vascular implants and grafts: Secondary | ICD-10-CM

## 2022-08-24 DIAGNOSIS — C50412 Malignant neoplasm of upper-outer quadrant of left female breast: Secondary | ICD-10-CM

## 2022-08-24 DIAGNOSIS — C779 Secondary and unspecified malignant neoplasm of lymph node, unspecified: Secondary | ICD-10-CM | POA: Diagnosis not present

## 2022-08-24 DIAGNOSIS — Z170 Estrogen receptor positive status [ER+]: Secondary | ICD-10-CM | POA: Insufficient documentation

## 2022-08-24 DIAGNOSIS — Z5112 Encounter for antineoplastic immunotherapy: Secondary | ICD-10-CM | POA: Insufficient documentation

## 2022-08-24 LAB — CMP (CANCER CENTER ONLY)
ALT: 33 U/L (ref 0–44)
AST: 28 U/L (ref 15–41)
Albumin: 4.3 g/dL (ref 3.5–5.0)
Alkaline Phosphatase: 66 U/L (ref 38–126)
Anion gap: 4 — ABNORMAL LOW (ref 5–15)
BUN: 9 mg/dL (ref 6–20)
CO2: 28 mmol/L (ref 22–32)
Calcium: 9.2 mg/dL (ref 8.9–10.3)
Chloride: 104 mmol/L (ref 98–111)
Creatinine: 0.56 mg/dL (ref 0.44–1.00)
GFR, Estimated: >60 mL/min (ref >60)
Glucose, Bld: 82 mg/dL (ref 70–99)
Potassium: 4 mmol/L (ref 3.5–5.1)
Sodium: 136 mmol/L (ref 135–145)
Total Bilirubin: 0.3 mg/dL (ref 0.3–1.2)
Total Protein: 6.7 g/dL (ref 6.5–8.1)

## 2022-08-24 LAB — CBC WITH DIFFERENTIAL (CANCER CENTER ONLY)
Abs Immature Granulocytes: 0 10*3/uL (ref 0.00–0.07)
Basophils Absolute: 0 10*3/uL (ref 0.0–0.1)
Basophils Relative: 1 %
Eosinophils Absolute: 0.2 10*3/uL (ref 0.0–0.5)
Eosinophils Relative: 6 %
HCT: 39.8 % (ref 36.0–46.0)
Hemoglobin: 13.7 g/dL (ref 12.0–15.0)
Immature Granulocytes: 0 %
Lymphocytes Relative: 32 %
Lymphs Abs: 1.1 10*3/uL (ref 0.7–4.0)
MCH: 29.3 pg (ref 26.0–34.0)
MCHC: 34.4 g/dL (ref 30.0–36.0)
MCV: 85.2 fL (ref 80.0–100.0)
Monocytes Absolute: 0.4 10*3/uL (ref 0.1–1.0)
Monocytes Relative: 11 %
Neutro Abs: 1.7 10*3/uL (ref 1.7–7.7)
Neutrophils Relative %: 50 %
Platelet Count: 144 10*3/uL — ABNORMAL LOW (ref 150–400)
RBC: 4.67 MIL/uL (ref 3.87–5.11)
RDW: 14 % (ref 11.5–15.5)
WBC Count: 3.5 10*3/uL — ABNORMAL LOW (ref 4.0–10.5)
nRBC: 0 % (ref 0.0–0.2)

## 2022-08-24 MED ORDER — DIPHENHYDRAMINE HCL 25 MG PO CAPS
25.0000 mg | ORAL_CAPSULE | Freq: Once | ORAL | Status: AC
  Administered 2022-08-24: 25 mg via ORAL
  Filled 2022-08-24: qty 1

## 2022-08-24 MED ORDER — SODIUM CHLORIDE 0.9% FLUSH
10.0000 mL | Freq: Once | INTRAVENOUS | Status: AC
  Administered 2022-08-24: 10 mL

## 2022-08-24 MED ORDER — SODIUM CHLORIDE 0.9 % IV SOLN: Freq: Once | INTRAVENOUS | Status: AC

## 2022-08-24 MED ORDER — SODIUM CHLORIDE 0.9% FLUSH
10.0000 mL | INTRAVENOUS | Status: DC | PRN
  Administered 2022-08-24: 10 mL

## 2022-08-24 MED ORDER — HEPARIN SOD (PORK) LOCK FLUSH 100 UNIT/ML IV SOLN
500.0000 [IU] | Freq: Once | INTRAVENOUS | Status: AC | PRN
  Administered 2022-08-24: 500 [IU]

## 2022-08-24 MED ORDER — TRASTUZUMAB-ANNS CHEMO 150 MG IV SOLR
6.0000 mg/kg | Freq: Once | INTRAVENOUS | Status: AC
  Administered 2022-08-24: 315 mg via INTRAVENOUS
  Filled 2022-08-24: qty 15

## 2022-08-24 MED ORDER — SODIUM CHLORIDE 0.9 % IV SOLN
420.0000 mg | Freq: Once | INTRAVENOUS | Status: AC
  Administered 2022-08-24: 420 mg via INTRAVENOUS
  Filled 2022-08-24: qty 14

## 2022-08-24 NOTE — Patient Instructions (Signed)
[facility] MEDICAL ONCOLOGY  Discharge Instructions: Thank you for choosing [facility] to provide your oncology and hematology care.   If you have a lab appointment with the Cancer Center, please go directly to the Cancer Center and check in at the registration area.   Wear comfortable clothing and clothing appropriate for easy access to any Portacath or PICC line.   We strive to give you quality time with your provider. You may need to reschedule your appointment if you arrive late (15 or more minutes).  Arriving late affects you and other patients whose appointments are after yours.  Also, if you miss three or more appointments without notifying the office, you may be dismissed from the clinic at the provider's discretion.      For prescription refill requests, have your pharmacy contact our office and allow 72 hours for refills to be completed.    Today you received the following chemotherapy and/or immunotherapy agents: Trastuzumab (Kanjinti) and Pertuzumab (Perjeta)   To help prevent nausea and vomiting after your treatment, we encourage you to take your nausea medication as directed.  BELOW ARE SYMPTOMS THAT SHOULD BE REPORTED IMMEDIATELY: *FEVER GREATER THAN 100.4 F (38 C) OR HIGHER *CHILLS OR SWEATING *NAUSEA AND VOMITING THAT IS NOT CONTROLLED WITH YOUR NAUSEA MEDICATION *UNUSUAL SHORTNESS OF BREATH *UNUSUAL BRUISING OR BLEEDING *URINARY PROBLEMS (pain or burning when urinating, or frequent urination) *BOWEL PROBLEMS (unusual diarrhea, constipation, pain near the anus) TENDERNESS IN MOUTH AND THROAT WITH OR WITHOUT PRESENCE OF ULCERS (sore throat, sores in mouth, or a toothache) UNUSUAL RASH, SWELLING OR PAIN  UNUSUAL VAGINAL DISCHARGE OR ITCHING   Items with * indicate a potential emergency and should be followed up as soon as possible or go to the Emergency Department if any problems should occur.  Please show the CHEMOTHERAPY ALERT CARD or  IMMUNOTHERAPY ALERT CARD at check-in to the Emergency Department and triage nurse.  Should you have questions after your visit or need to cancel or reschedule your appointment, please contact [facility]COL[facility]  Dept: [phone]  and follow the prompts.  Office hours are 8:00 a.m. to 4:30 p.m. Monday - Friday. Please note that voicemails left after 4:00 p.m. may not be returned until the following business day.  We are closed weekends and major holidays. You have access to a nurse at all times for urgent questions. Please call the main number to the clinic Dept: [phone] and follow the prompts.   For any non-urgent questions, you may also contact your provider using MyChart. We now offer e-Visits for anyone 18 and older to request care online for non-urgent symptoms. For details visit [email or web address].   Also download the MyChart app! Go to the app store, search "MyChart", open the app, select [facility], and log in with your MyChart username and password.  Masks are optional in the cancer centers. If you would like for your care team to wear a mask while they are taking care of you, please let them know. You may have one support person who is at least 59 years old accompany you for your appointments. Trastuzumab Injection What is this medication? TRASTUZUMAB (tras TOO zoo mab) treats breast cancer and stomach cancer. It works by blocking a protein that causes cancer cells to grow and multiply. This helps to slow or stop the spread of cancer cells. This medicine may be used for other purposes; ask your health care provider or pharmacist if you have questions.   COMMON BRAND NAME(S): Herceptin, Herzuma, KANJINTI, Ogivri, Ontruzant, Trazimera What should I tell my care team before I take this medication? They need to know if you have any of these conditions: Heart failure Lung disease An unusual or allergic reaction to trastuzumab, other medications, foods, dyes,  or preservatives Pregnant or trying to get pregnant Breast-feeding How should I use this medication? This medication is injected into a vein. It is given by your care team in a hospital or clinic setting. Talk to your care team about the use of this medication in children. It is not approved for use in children. Overdosage: If you think you have taken too much of this medicine contact a poison control center or emergency room at once. NOTE: This medicine is only for you. Do not share this medicine with others. What if I miss a dose? Keep appointments for follow-up doses. It is important not to miss your dose. Call your care team if you are unable to keep an appointment. What may interact with this medication? Certain types of chemotherapy, such as daunorubicin, doxorubicin, epirubicin, idarubicin This list may not describe all possible interactions. Give your health care provider a list of all the medicines, herbs, non-prescription drugs, or dietary supplements you use. Also tell them if you smoke, drink alcohol, or use illegal drugs. Some items may interact with your medicine. What should I watch for while using this medication? Your condition will be monitored carefully while you are receiving this medication. This medication may make you feel generally unwell. This is not uncommon, as chemotherapy affects healthy cells as well as cancer cells. Report any side effects. Continue your course of treatment even though you feel ill unless your care team tells you to stop. This medication may increase your risk of getting an infection. Call your care team for advice if you get a fever, chills, sore throat, or other symptoms of a cold or flu. Do not treat yourself. Try to avoid being around people who are sick. Avoid taking medications that contain aspirin, acetaminophen, ibuprofen, naproxen, or ketoprofen unless instructed by your care team. These medications can hide a fever. Talk to your care team if  you may be pregnant. Serious birth defects can occur if you take this medication during pregnancy and for 7 months after the last dose. You will need a negative pregnancy test before starting this medication. Contraception is recommended while taking this medication and for 7 months after the last dose. Your care team can help you find the option that works for you. Do not breastfeed while taking this medication and for 7 months after stopping treatment. What side effects may I notice from receiving this medication? Side effects that you should report to your care team as soon as possible: Allergic reactions or angioedema--skin rash, itching or hives, swelling of the face, eyes, lips, tongue, arms, or legs, trouble swallowing or breathing Dry cough, shortness of breath or trouble breathing Heart failure--shortness of breath, swelling of the ankles, feet, or hands, sudden weight gain, unusual weakness or fatigue Infection--fever, chills, cough, or sore throat Infusion reactions--chest pain, shortness of breath or trouble breathing, feeling faint or lightheaded Side effects that usually do not require medical attention (report to your care team if they continue or are bothersome): Diarrhea Dizziness Headache Nausea Trouble sleeping Vomiting This list may not describe all possible side effects. Call your doctor for medical advice about side effects. You may report side effects to FDA at 1-800-FDA-1088. Where should I keep my   medication? This medication is given in a hospital or clinic. It will not be stored at home. NOTE: This sheet is a summary. It may not cover all possible information. If you have questions about this medicine, talk to your doctor, pharmacist, or health care provider.  2023 Elsevier/Gold Standard (2022-03-14 00:00:00) Pertuzumab Injection What is this medication? PERTUZUMAB (per TOOZ ue mab) treats breast cancer. It works by blocking a protein that causes cancer cells to grow  and multiply. This helps to slow or stop the spread of cancer cells. It is a monoclonal antibody. This medicine may be used for other purposes; ask your health care provider or pharmacist if you have questions. COMMON BRAND NAME(S): PERJETA What should I tell my care team before I take this medication? They need to know if you have any of these conditions: Heart failure An unusual or allergic reaction to pertuzumab, other medications, foods, dyes, or preservatives Pregnant or trying to get pregnant Breast-feeding How should I use this medication? This medication is injected into a vein. It is given by your care team in a hospital or clinic setting. Talk to your care team about the use of this medication in children. Special care may be needed. Overdosage: If you think you have taken too much of this medicine contact a poison control center or emergency room at once. NOTE: This medicine is only for you. Do not share this medicine with others. What if I miss a dose? Keep appointments for follow-up doses. It is important not to miss your dose. Call your care team if you are unable to keep an appointment. What may interact with this medication? Interactions are not expected. This list may not describe all possible interactions. Give your health care provider a list of all the medicines, herbs, non-prescription drugs, or dietary supplements you use. Also tell them if you smoke, drink alcohol, or use illegal drugs. Some items may interact with your medicine. What should I watch for while using this medication? Your condition will be monitored carefully while you are receiving this medication. This medication may make you feel generally unwell. This is not uncommon as chemotherapy can affect healthy cells as well as cancer cells. Report any side effects. Continue your course of treatment even though you feel ill unless your care team tells you to stop. Talk to your care team if you may be pregnant.  Serious birth defects can occur if you take this medication during pregnancy and for 7 months after the last dose. You will need a negative pregnancy test before starting this medication. Contraception is recommended while taking this medication and for 7 months after the last dose. Your care team can help you find the option that works for you. Do not breastfeed while taking this medication and for 7 months after the last dose. What side effects may I notice from receiving this medication? Side effects that you should report to your care team as soon as possible: Allergic reactions or angioedema--skin rash, itching or hives, swelling of the face, eyes, lips, tongue, arms, or legs, trouble swallowing or breathing Heart failure--shortness of breath, swelling of the ankles, feet, or hands, sudden weight gain, unusual weakness or fatigue Infusion reactions--chest pain, shortness of breath or trouble breathing, feeling faint or lightheaded Side effects that usually do not require medical attention (report to your care team if they continue or are bothersome): Diarrhea Dry skin Fatigue Hair loss Nausea Vomiting This list may not describe all possible side effects. Call your doctor   for medical advice about side effects. You may report side effects to FDA at 1-800-FDA-1088. Where should I keep my medication? This medication is given in a hospital or clinic. It will not be stored at home. NOTE: This sheet is a summary. It may not cover all possible information. If you have questions about this medicine, talk to your doctor, pharmacist, or health care provider.  2023 Elsevier/Gold Standard (2022-03-02 00:00:00) 

## 2022-08-24 NOTE — Assessment & Plan Note (Signed)
03/14/2022:Screening detected left breast masses 1.5 cm and 1.3 cm with enlarged left axillary lymph node, biopsy revealed grade 3 IDC with lymph node being positive, ER 95%, PR 0%, HER2 2+ by IHC and FISH positive with a ratio 2.45 and a copy #4.9, the second biopsy was HER2 negative with a ratio 2.15 and a copy #3.55  Treatment planbased on multidisciplinary tumor board: 1. Neoadjuvant chemotherapy withHerceptin Perjeta and letrozole for 3 cycles. Reassess with another MRI and then decide if she can continue with the same plan or do we need to add chemotherapy. (Our original recommendation is TCHP x6 cycles but patient is not willing to go through chemo right away) 2. Followed by breast conserving surgery if possible with sentinel lymph node study 3. Followed by adjuvant radiation therapy if patient had lumpectomy 4. Adjuvant antiestrogen therapy with neratinib -------------------------------------------------------------------------------------------------------------------------- Current Treatment: Cycle4Herceptin and Perjeta CT CAP and Bone Scan: 04/16/22: No distant mets Toxicities: Maculopapular rash: Requested a different biosimilar Mammogram and ultrasound 06/20/2022: Interval decrease in the size of the mass from 1.3 cm to 1 cm. Second mass measured 1.3 cm and noted 0.8 cm. Left axillary ultrasound 1.1 cm lymph node previously was 0.6.  08/22/2022: Mammogram and ultrasound: Biopsy-proven malignancy (stable.  2 o'clock position mass is smaller.  There are now 2 abnormal lymph nodes in the left axilla 1 was previously biopsied  We are concerned about the additional lymph node metastases detected by ultrasound.  Although the primary tumor appears to be under stable and improved conditions because of the presence of additional lymph node I recommended that we use proceed to surgery instead.

## 2022-08-25 ENCOUNTER — Other Ambulatory Visit: Payer: BC Managed Care – PPO

## 2022-08-25 ENCOUNTER — Ambulatory Visit: Payer: BC Managed Care – PPO | Admitting: Hematology and Oncology

## 2022-08-25 ENCOUNTER — Ambulatory Visit: Payer: BC Managed Care – PPO

## 2022-08-28 DIAGNOSIS — R52 Pain, unspecified: Secondary | ICD-10-CM | POA: Insufficient documentation

## 2022-08-28 DIAGNOSIS — R5383 Other fatigue: Secondary | ICD-10-CM | POA: Insufficient documentation

## 2022-08-30 ENCOUNTER — Encounter: Payer: Self-pay | Admitting: *Deleted

## 2022-09-01 ENCOUNTER — Ambulatory Visit: Payer: Self-pay | Admitting: Surgery

## 2022-09-01 DIAGNOSIS — C50912 Malignant neoplasm of unspecified site of left female breast: Secondary | ICD-10-CM

## 2022-09-02 ENCOUNTER — Other Ambulatory Visit: Payer: Self-pay | Admitting: Surgery

## 2022-09-02 DIAGNOSIS — C50412 Malignant neoplasm of upper-outer quadrant of left female breast: Secondary | ICD-10-CM

## 2022-09-04 ENCOUNTER — Encounter: Payer: Self-pay | Admitting: *Deleted

## 2022-09-06 ENCOUNTER — Other Ambulatory Visit: Payer: Self-pay | Admitting: Surgery

## 2022-09-06 DIAGNOSIS — C50912 Malignant neoplasm of unspecified site of left female breast: Secondary | ICD-10-CM

## 2022-09-09 NOTE — Progress Notes (Signed)
Patient Care Team: Bradd Canary, MD as PCP - General (Family Medicine) Mauro Kaufmann, RN as Oncology Nurse Navigator Rockwell Germany, RN as Oncology Nurse Navigator Nicholas Lose, MD as Consulting Physician (Hematology and Oncology)  DIAGNOSIS: No diagnosis found.  SUMMARY OF ONCOLOGIC HISTORY: Oncology History  Malignant neoplasm of upper-outer quadrant of left breast in female, estrogen receptor positive ([place])  03/14/2022 Initial Diagnosis   Screening detected left breast masses 1.5 cm and 1.3 cm with enlarged left axillary lymph node, biopsy revealed grade 3 IDC with lymph node being positive, ER 95%, PR 0%, HER2 2+ by IHC and FISH positive with a ratio 2.45 and a copy #4.9, the second biopsy was HER2 negative with a ratio 2.15 and a copy #3.55   03/29/2022 Cancer Staging   Staging form: Breast, AJCC 8th Edition - Clinical: Stage IIA (cT1c, cN1, cM0, G3, ER+, PR-, HER2+) - Signed by Nicholas Lose, MD on 03/29/2022 Stage prefix: Initial diagnosis Histologic grading system: 3 grade system   03/29/2022 -  Anti-estrogen oral therapy   Letrozole daily   04/21/2022 -  Chemotherapy   Herceptin/Perjeta every 21 days initially x 3, will reassess with MRI and then will decide whether to continue or to add chemo.  (Original recommendation was TCHP x 6)      Genetic Testing   [facility] CancerNext-Expanded Panel was Negative. Report date is 05/30/2022.  The CancerNext-Expanded gene panel offered by [facility] and includes sequencing, rearrangement, and RNA analysis for the following 77 genes: AIP, ALK, APC, ATM, AXIN2, BAP1, BARD1, BLM, BMPR1A, BRCA1, BRCA2, BRIP1, CDC73, CDH1, CDK4, CDKN1B, CDKN2A, CHEK2, CTNNA1, DICER1, FANCC, FH, FLCN, GALNT12, KIF1B, LZTR1, MAX, MEN1, MET, MLH1, MSH2, MSH3, MSH6, MUTYH, NBN, NF1, NF2, NTHL1, PALB2, PHOX2B, PMS2, POT1, PRKAR1A, PTCH1, PTEN, RAD51C, RAD51D, RB1, RECQL, RET, SDHA, SDHAF2, SDHB, SDHC, SDHD, SMAD4, SMARCA4, SMARCB1, SMARCE1, STK11, SUFU,  TMEM127, TP53, TSC1, TSC2, VHL and XRCC2 (sequencing and deletion/duplication); EGFR, EGLN1, HOXB13, KIT, MITF, PDGFRA, POLD1, and POLE (sequencing only); EPCAM and GREM1 (deletion/duplication only).    04/21/2022 -  Chemotherapy   Patient is on Treatment Plan : BREAST Trastuzumab  + Pertuzumab q21d x 13 cycles       CHIEF COMPLIANT:  follow-up Herceptin and Perjeta    INTERVAL HISTORY: Caitlyn Torres is a 61 year old above-mentioned history of HER2 positive breast cancer was currently on Herceptin and Perjeta. She presents to the clinic for a follow-up.   ALLERGIES:  is allergic to aleve [naproxen sodium], aspirin, macrobid [nitrofurantoin], motrin [ibuprofen], and tylenol [acetaminophen].  MEDICATIONS:  Current Outpatient Medications  Medication Sig Dispense Refill   Brimonidine Tartrate (LUMIFY) 0.025 % SOLN Place 1 drop into both eyes daily as needed (red eyes).     carboxymethylcellulose (REFRESH PLUS) 0.5 % SOLN Place 1 drop into both eyes 3 (three) times daily as needed (dry eyes).     cetirizine (ZYRTEC) 10 MG tablet Take 10 mg by mouth daily.     EPINEPHrine 0.3 mg/0.3 mL IJ SOAJ injection Inject 0.3 mg into the muscle as needed for anaphylaxis. Prn (Patient not taking: Reported on 08/14/2022)     estradiol (ESTRACE VAGINAL) 0.1 MG/GM vaginal cream Place 1 Applicatorful vaginally once a week. 42.5 g 12   letrozole (FEMARA) 2.5 MG tablet Take 1 tablet (2.5 mg total) by mouth daily. 90 tablet 3   magic mouthwash w/lidocaine SOLN Take 5 mLs by mouth 4 (four) times daily as needed for mouth pain. (Patient not taking: Reported on 09/08/2022) 240  mL 1   Multiple Vitamins-Iron (CHLORELLA) CAPS Take 1 capsule by mouth daily.     OVER THE COUNTER MEDICATION Take 1 capsule by mouth daily. Amla     OVER THE COUNTER MEDICATION Take 1 capsule by mouth daily. Mushroom Complex     OVER THE COUNTER MEDICATION Place 1 Application vaginally daily as needed (moisture). Femininity     QUERCETIN  PO Take 1 capsule by mouth daily.     sodium chloride (OCEAN) 0.65 % SOLN nasal spray Place 1 spray into both nostrils as needed for congestion.     traMADol (ULTRAM) 50 MG tablet Take 50 mg by mouth every 6 (six) hours as needed for moderate pain.     TURMERIC CURCUMIN PO Take 1 capsule by mouth daily.     No current facility-administered medications for this visit.    PHYSICAL EXAMINATION: ECOG PERFORMANCE STATUS: {CHL ONC ECOG PS:[phone]}  There were no vitals filed for this visit. There were no vitals filed for this visit.  BREAST:*** No palpable masses or nodules in either right or left breasts. No palpable axillary supraclavicular or infraclavicular adenopathy no breast tenderness or nipple discharge. (exam performed in the presence of a chaperone)  LABORATORY DATA:  I have reviewed the data as listed    Latest Ref Rng & Units 08/24/2022    9:17 AM 07/13/2022   11:38 AM 06/22/2022    9:33 AM  CMP  Glucose 70 - 99 mg/dL 82  96  94   BUN 6 - 20 mg/dL _0 Creatinine 0.44 - 1.00 mg/dL 0.56  0.55  0.55   Sodium 135 - 145 mmol/L 136  137  138   Potassium 3.5 - 5.1 mmol/L 4.0  4.1  3.8   Chloride 98 - 111 mmol/L 104  104  105   CO2 22 - 32 mmol/L _1 Calcium 8.9 - 10.3 mg/dL 9.2  9.6  9.1   Total Protein 6.5 - 8.1 g/dL 6.7  6.7  6.8   Total Bilirubin 0.3 - 1.2 mg/dL 0.3  0.4  0.3   Alkaline Phos 38 - 126 U/L 66  57  58   AST 15 - 41 U/L _2 ALT 0 - 44 U/L 33  33  25     Lab Results  Component Value Date   WBC 3.5 (L) 08/24/2022   HGB 13.7 08/24/2022   HCT 39.8 08/24/2022   MCV 85.2 08/24/2022   PLT 144 (L) 08/24/2022   NEUTROABS 1.7 08/24/2022    ASSESSMENT & PLAN:  No problem-specific Assessment & Plan notes found for this encounter.    No orders of the defined types were placed in this encounter.  The patient has a good understanding of the overall plan. she agrees with it. she will call with any problems that may develop before the  next visit here. Total time spent: 30 mins including face to face time and time spent for planning, charting and co-ordination of care   Suzzette Righter, [place] 09/09/22    I Gardiner Coins am scribing for Dr. Lindi Adie  ***

## 2022-09-12 NOTE — Pre-Procedure Instructions (Signed)
Surgical Instructions    Your procedure is scheduled on Wednesday November 8.  Report to [facility] Main Entrance "A" at 6:30 A.M., then check in with the Admitting office.  Call this number if you have problems the morning of surgery:  [phone]  If you have any questions prior to your surgery date call [phone]: Open Monday-Friday 8am-4pm  If you experience any cold, Covid, or flu symptoms such as cough, fever, chills, shortness of breath, etc. between now and your scheduled surgery, please notify us at the above number     Remember:  You may drink clear liquids until 5:30 the morning of your surgery.   Clear liquids allowed are: Water, Non-Citrus Juices (without pulp), Carbonated Beverages, Clear Tea, Black Coffee ONLY (NO MILK, CREAM OR POWDERED CREAMER of any kind), and Gatorade   Patient Instructions  The night before surgery:  No food after midnight. ONLY clear liquids after midnight   The day of surgery (if you do NOT have diabetes):  Drink ONE (1) Pre-Surgery Clear Ensure by 5:30 the morning of surgery. Drink in one sitting. Do not sip.  This drink was given to you during your hospital  pre-op appointment visit.  Nothing else to drink after completing the  Pre-Surgery Clear Ensure.          If you have questions, please contact your surgeon's office.    Take these medications the morning of surgery with A SIP OF WATER:  cetirizine (ZYRTEC) letrozole [facility])  You may take these medications with A SIP OF WATER IF YOU NEED THEM: Brimonidine Tartrate (LUMIFY)  carboxymethylcellulose (REFRESH PLUS) sodium chloride (OCEAN) traMADol (ULTRAM)    As of today, STOP taking any Aspirin (unless otherwise instructed by your surgeon) Aleve, Naproxen, Ibuprofen, Motrin, Advil, Goody's, BC's, all herbal medications, fish oil, and all vitamins.          Do NOT Smoke (Tobacco/Vaping)  24 hours prior to your procedure  If you use a CPAP at night, you may bring your  mask for your overnight stay.   Contacts, glasses, hearing aids, dentures or partials may not be worn into surgery, please bring cases for these belongings   For patients admitted to the hospital, discharge time will be determined by your treatment team.   Patients discharged the day of surgery will not be allowed to drive home, and someone needs to stay with them for 24 hours.   SURGICAL WAITING ROOM VISITATION Patients having surgery or a procedure may have no more than 2 support people in the waiting area - these visitors may rotate.   Children under the age of 47 must have an adult with them who is not the patient. If the patient needs to stay at the hospital during part of their recovery, the visitor guidelines for inpatient rooms apply. Pre-op nurse will coordinate an appropriate time for 1 support person to accompany patient in pre-op.  This support person may not rotate.   Please refer to the [facility] website for the visitor guidelines for Inpatients (after your surgery is over and you are in a regular room).    Special instructions:    Oral Hygiene is also important to reduce your risk of infection.  Remember -  BRUSH YOUR TEETH THE MORNING OF SURGERY WITH YOUR REGULAR TOOTHPASTE   [place]- Preparing For Surgery  Before surgery, you can play an important role. Because skin is not sterile, your skin needs to be as free of germs as possible. You can  reduce the number of germs on your skin by washing with CHG (chlorahexidine gluconate) Soap before surgery.  CHG is an antiseptic cleaner which kills germs and bonds with the skin to continue killing germs even after washing.     Please do not use if you have an allergy to CHG or antibacterial soaps. If your skin becomes reddened/irritated stop using the CHG.  Do not shave (including legs and underarms) for at least 48 hours prior to first CHG shower. It is OK to shave your face.  Please follow these instructions  carefully.    Shower the NIGHT BEFORE SURGERY and the MORNING OF SURGERY with CHG Soap.  If you chose to wash your hair, wash your hair first as usual with your normal shampoo.  After you shampoo, rinse your hair and body thoroughly to remove the shampoo.   Then [employer] and genitals (private parts) with your normal soap and rinse thoroughly to remove soap.  After that Use CHG Soap as you would any other liquid soap.  You can apply CHG directly to the skin and wash gently with a scrungie or a clean washcloth.   Apply the CHG Soap to your body ONLY FROM THE NECK DOWN.   Do not use on open wounds or open sores.  Avoid contact with your eyes, ears, mouth and genitals (private parts).  Wash thoroughly, paying special attention to the area where your surgery will be performed.  Thoroughly rinse your body with warm water from the neck down.  DO NOT shower/wash with your normal soap after using and rinsing off the CHG Soap.  Pat yourself dry with a CLEAN TOWEL.  Wear CLEAN PAJAMAS to bed the night before surgery  Place CLEAN SHEETS on your bed the night before your surgery  DO NOT SLEEP WITH PETS.   Day of Surgery:  Take a shower with CHG soap. Wear Clean/Comfortable clothing the morning of surgery Brush your teeth WITH YOUR REGULAR TOOTHPASTE. Do not wear jewelry or makeup. Do not wear lotions, powders, perfumes or deodorant. Do not shave 48 hours prior to surgery. Do not bring valuables to the hospital.  [facility] is not responsible for any belongings or valuables.  Do not wear nail polish, gel polish, artificial nails, or any other type of covering on natural nails (fingers and toes) If you have artificial nails or gel coating that need to be removed by a nail salon, please have this removed prior to surgery. Artificial nails or gel coating may interfere with anesthesia's ability to adequately monitor your vital signs.    If you received a COVID test during your pre-op  visit, it is requested that you wear a mask when out in public, stay away from anyone that may not be feeling well, and notify your surgeon if you develop symptoms. If you have been in contact with anyone that has tested positive in the last 10 days, please notify your surgeon.    Please read over the following fact sheets that you were given.

## 2022-09-13 ENCOUNTER — Encounter (HOSPITAL_COMMUNITY): Payer: Self-pay

## 2022-09-13 ENCOUNTER — Other Ambulatory Visit: Payer: Self-pay

## 2022-09-13 ENCOUNTER — Encounter (HOSPITAL_COMMUNITY)
Admission: RE | Admit: 2022-09-13 | Discharge: 2022-09-13 | Disposition: A | Discharge status: Home / Self Care | Payer: BC Managed Care – PPO | Source: Ambulatory Visit | Attending: Surgery | Admitting: Surgery

## 2022-09-13 VITALS — BP 137/54 | HR 65 | Temp 98.7°F | Resp 16 | Ht 63.0 in | Wt 117.8 lb

## 2022-09-13 DIAGNOSIS — C50912 Malignant neoplasm of unspecified site of left female breast: Secondary | ICD-10-CM | POA: Insufficient documentation

## 2022-09-13 DIAGNOSIS — Z87891 Personal history of nicotine dependence: Secondary | ICD-10-CM | POA: Insufficient documentation

## 2022-09-13 DIAGNOSIS — K219 Gastro-esophageal reflux disease without esophagitis: Secondary | ICD-10-CM | POA: Insufficient documentation

## 2022-09-13 DIAGNOSIS — Z01818 Encounter for other preprocedural examination: Secondary | ICD-10-CM | POA: Diagnosis present

## 2022-09-13 DIAGNOSIS — Z170 Estrogen receptor positive status [ER+]: Secondary | ICD-10-CM | POA: Diagnosis not present

## 2022-09-13 DIAGNOSIS — Z9221 Personal history of antineoplastic chemotherapy: Secondary | ICD-10-CM | POA: Diagnosis not present

## 2022-09-13 HISTORY — DX: Unspecified osteoarthritis, unspecified site: M19.90

## 2022-09-13 HISTORY — DX: Personal history of other diseases of the digestive system: Z87.19

## 2022-09-13 HISTORY — DX: Gastro-esophageal reflux disease without esophagitis: K21.9

## 2022-09-13 HISTORY — DX: Anxiety disorder, unspecified: F41.9

## 2022-09-13 HISTORY — DX: Depression, unspecified: F32.A

## 2022-09-13 LAB — CBC
HCT: 41.2 % (ref 36.0–46.0)
Hemoglobin: 14 g/dL (ref 12.0–15.0)
MCH: 29.6 pg (ref 26.0–34.0)
MCHC: 34 g/dL (ref 30.0–36.0)
MCV: 87.1 fL (ref 80.0–100.0)
Platelets: 155 10*3/uL (ref 150–400)
RBC: 4.73 MIL/uL (ref 3.87–5.11)
RDW: 13.6 % (ref 11.5–15.5)
WBC: 3.7 10*3/uL — ABNORMAL LOW (ref 4.0–10.5)
nRBC: 0 % (ref 0.0–0.2)

## 2022-09-13 LAB — BASIC METABOLIC PANEL
Anion gap: 15 (ref 5–15)
BUN: 9 mg/dL (ref 6–20)
CO2: 25 mmol/L (ref 22–32)
Calcium: 10 mg/dL (ref 8.9–10.3)
Chloride: 97 mmol/L — ABNORMAL LOW (ref 98–111)
Creatinine, Ser: 0.68 mg/dL (ref 0.44–1.00)
GFR, Estimated: >60 mL/min (ref >60)
Glucose, Bld: 91 mg/dL (ref 70–99)
Potassium: 4.3 mmol/L (ref 3.5–5.1)
Sodium: 137 mmol/L (ref 135–145)

## 2022-09-13 NOTE — Progress Notes (Signed)
PCP - Leretha Pol Cardiologist - denies  PPM/ICD - denies  Chest x-ray - 04/20/22 EKG - 05/12/22- CE Stress Test - denies ECHO - 07/31/22 Cardiac Cath - denies  Sleep Study - denies CPAP - n/a  Denies diabetes.  As of today, STOP taking any Aspirin (unless otherwise instructed by your surgeon) Aleve, Naproxen, Ibuprofen, Motrin, Advil, Goody's, BC's, all herbal medications, fish oil, and all vitamins.  ERAS Protcol - yes PRE-SURGERY Ensure or G2- ensure  COVID TEST- n/a  Anesthesia review: yes- seed placement  Patient denies shortness of breath, fever, cough and chest pain at PAT appointment   All instructions explained to the patient, with a verbal understanding of the material. Patient agrees to go over the instructions while at home for a better understanding. The opportunity to ask questions was provided.

## 2022-09-14 ENCOUNTER — Inpatient Hospital Stay: Payer: BC Managed Care – PPO

## 2022-09-14 ENCOUNTER — Inpatient Hospital Stay: Payer: BC Managed Care – PPO | Attending: Hematology and Oncology | Admitting: Hematology and Oncology

## 2022-09-14 ENCOUNTER — Other Ambulatory Visit: Payer: BC Managed Care – PPO

## 2022-09-14 VITALS — BP 118/65 | HR 60 | Temp 97.8°F | Resp 18

## 2022-09-14 VITALS — BP 120/64 | HR 77 | Temp 97.3°F | Resp 18 | Ht 63.0 in | Wt 119.5 lb

## 2022-09-14 DIAGNOSIS — R21 Rash and other nonspecific skin eruption: Secondary | ICD-10-CM | POA: Diagnosis not present

## 2022-09-14 DIAGNOSIS — Z170 Estrogen receptor positive status [ER+]: Secondary | ICD-10-CM | POA: Insufficient documentation

## 2022-09-14 DIAGNOSIS — Z5112 Encounter for antineoplastic immunotherapy: Secondary | ICD-10-CM | POA: Insufficient documentation

## 2022-09-14 DIAGNOSIS — Z79811 Long term (current) use of aromatase inhibitors: Secondary | ICD-10-CM | POA: Insufficient documentation

## 2022-09-14 DIAGNOSIS — C50412 Malignant neoplasm of upper-outer quadrant of left female breast: Secondary | ICD-10-CM | POA: Diagnosis not present

## 2022-09-14 MED ORDER — TRASTUZUMAB-ANNS CHEMO 150 MG IV SOLR
6.0000 mg/kg | Freq: Once | INTRAVENOUS | Status: AC
  Administered 2022-09-14: 315 mg via INTRAVENOUS
  Filled 2022-09-14: qty 15

## 2022-09-14 MED ORDER — DIPHENHYDRAMINE HCL 25 MG PO CAPS
25.0000 mg | ORAL_CAPSULE | Freq: Once | ORAL | Status: AC
  Administered 2022-09-14: 25 mg via ORAL
  Filled 2022-09-14: qty 1

## 2022-09-14 MED ORDER — HEPARIN SOD (PORK) LOCK FLUSH 100 UNIT/ML IV SOLN
500.0000 [IU] | Freq: Once | INTRAVENOUS | Status: AC | PRN
  Administered 2022-09-14: 500 [IU]

## 2022-09-14 MED ORDER — SODIUM CHLORIDE 0.9 % IV SOLN
420.0000 mg | Freq: Once | INTRAVENOUS | Status: AC
  Administered 2022-09-14: 420 mg via INTRAVENOUS
  Filled 2022-09-14: qty 14

## 2022-09-14 MED ORDER — SODIUM CHLORIDE 0.9% FLUSH
10.0000 mL | INTRAVENOUS | Status: DC | PRN
  Administered 2022-09-14: 10 mL

## 2022-09-14 MED ORDER — SODIUM CHLORIDE 0.9 % IV SOLN: Freq: Once | INTRAVENOUS | Status: AC

## 2022-09-14 NOTE — Anesthesia Preprocedure Evaluation (Addendum)
Anesthesia Evaluation  Patient identified by MRN, date of birth, ID band Patient awake    Reviewed: Allergy & Precautions, NPO status , Patient's Chart, lab work & pertinent test results  History of Anesthesia Complications Negative for: history of anesthetic complications  Airway Mallampati: II  TM Distance: >3 FB Neck ROM: Full    Dental  (+) Dental Advisory Given, Teeth Intact   Pulmonary former smoker   Pulmonary exam normal        Cardiovascular negative cardio ROS Normal cardiovascular exam     Neuro/Psych negative neurological ROS     GI/Hepatic Neg liver ROS, hiatal hernia,GERD  ,,  Endo/Other  negative endocrine ROS    Renal/GU negative Renal ROS  negative genitourinary   Musculoskeletal negative musculoskeletal ROS (+)    Abdominal   Peds  Hematology negative hematology ROS (+)   Anesthesia Other Findings Left breast cancer  Reproductive/Obstetrics                             Anesthesia Physical Anesthesia Plan  ASA: 2  Anesthesia Plan: General   Post-op Pain Management: Regional block*   Induction: Intravenous  PONV Risk Score and Plan: 3 and Ondansetron, Dexamethasone, Midazolam and Treatment may vary due to age or medical condition  Airway Management Planned: LMA  Additional Equipment: None  Intra-op Plan:   Post-operative Plan: Extubation in OR  Informed Consent: I have reviewed the patients History and Physical, chart, labs and discussed the procedure including the risks, benefits and alternatives for the proposed anesthesia with the patient or authorized representative who has indicated his/her understanding and acceptance.     Dental advisory given  Plan Discussed with:   Anesthesia Plan Comments: (PAT note written 09/14/2022 by Shonna Chock, PA-C.  )       Anesthesia Quick Evaluation

## 2022-09-14 NOTE — Progress Notes (Signed)
Anesthesia Chart Review:  Case: [id number] Date/Time: 09/20/22 0815   Procedure: LEFT BREAST SEED LUMPECTOMY, LEFT AXILLARY LYMPH NODE DISSECTION (Left) - GEN & PEC BLOCK   Anesthesia type: General   Pre-op diagnosis: LEFT BREAST CANCER   Location: [place] OR ROOM 06 / [place] OR   Surgeons: Erroll Luna, MD       DISCUSSION: Patient is a 61 year old female scheduled for the above procedure.  History includes former smoker, left breast cancer (IDC, + left axillary LN 03/14/22; Port-a-cath 04/20/22; s/p chemotherapy with Herceptin and Perjeta), GERD, hiatal hernia.  Last seen by oncologist Dr. Lindi Adie on 09/14/22. Received Herceptin and Perjeta. He notes that breast MRI is scheduled for 09/17/22 and subsequently will undergo surgery. Anticipates initiating Kadcyla after surgery and her recovery.   RSL is scheduled for 09/19/22 at 1:00 PM. Anesthesia team to evaluate on the day of surgery.   VS: BP (!) 137/54   Pulse 65   Temp 37.1 C   Resp 16   Ht '5\' 3"'$  (1.6 m)   Wt 53.4 kg   LMP 09/25/2019   SpO2 100%   BMI 20.87 kg/m Postmenopausal.   PROVIDERS: Leretha Pol, NP is PCP, first established 08/14/22. She was seeing Bradd Canary, MD in [place].   Nicholas Lose, MD is HEM-ONC Kyung Rudd, MD is RAD-ONC   LABS: Most recent lab results in [facility] include: Lab Results  Component Value Date   WBC 3.7 (L) 09/13/2022   HGB 14.0 09/13/2022   HCT 41.2 09/13/2022   PLT 155 09/13/2022   GLUCOSE 91 09/13/2022   ALT 33 08/24/2022   AST 28 08/24/2022   NA 137 09/13/2022   K 4.3 09/13/2022   CL 97 (L) 09/13/2022   CREATININE 0.68 09/13/2022   BUN 9 09/13/2022   CO2 25 09/13/2022     IMAGES: 1V PCXR 04/20/22 (post-Port): IMPRESSION: 1. No evidence of acute cardiopulmonary disease. 2. Right IJ Port-A-Cath with the tip projecting at the superior cavoatrial junction. No visible pneumothorax on this semi erect radiograph. Small amount of subcutaneous gas in the right supraclavicular  region, possibly postsurgical. Recommend low threshold for repeat imaging to exclude developing pneumothorax.  Bone Scan 04/14/22: IMPRESSION: 1. No definite evidence for skeletal metastatic disease. 2. Likely degenerative foci of activity at the shoulders and lower lumbar spine.    EKG: N/A. 03/07/11 EKG showed sinus rhythm with sinus arrhythmia.   CV: Echo 07/31/22 (re: chemotherapy): IMPRESSIONS   1. Left ventricular ejection fraction, by estimation, is 55 to 60%. The  left ventricle has normal function. The left ventricle has no regional  wall motion abnormalities. Left ventricular diastolic parameters were  normal. The average left ventricular  global longitudinal strain is -19.2 %. The global longitudinal strain is  normal.   2. Right ventricular systolic function is normal. The right ventricular  size is normal. There is normal pulmonary artery systolic pressure. The  estimated right ventricular systolic pressure is 14.4 mmHg.   3. The mitral valve is grossly normal. Trivial mitral valve  regurgitation. No evidence of mitral stenosis.   4. The aortic valve is tricuspid. Aortic valve regurgitation is not  visualized. No aortic stenosis is present.   5. The inferior vena cava is normal in size with greater than 50%  respiratory variability, suggesting right atrial pressure of 3 mmHg.  - Comparison(s): No significant change from prior study.    Past Medical History:  Diagnosis Date   Anxiety    Arthritis    Cancer (  [place]) 03/2022   L breast   Depression    Family history of breast cancer 05/15/2022   Family history of pancreatic cancer 05/15/2022   Family history of prostate cancer 05/15/2022   GERD (gastroesophageal reflux disease)    History of hiatal hernia    patient believes she may have been told she has this years ago    Past Surgical History:  Procedure Laterality Date   CERVIX SURGERY     Laser removal of pre-cancer cells   LASIK Bilateral    PORTACATH  PLACEMENT Right 04/20/2022   Procedure: PORT PLACEMENT;  Surgeon: Erroll Luna, MD;  Location: [place];  Service: General;  Laterality: Right;    MEDICATIONS:  Brimonidine Tartrate (LUMIFY) 0.025 % SOLN   carboxymethylcellulose (REFRESH PLUS) 0.5 % SOLN   cetirizine (ZYRTEC) 10 MG tablet   EPINEPHrine 0.3 mg/0.3 mL IJ SOAJ injection   estradiol (ESTRACE VAGINAL) 0.1 MG/GM vaginal cream   letrozole (FEMARA) 2.5 MG tablet   magic mouthwash w/lidocaine SOLN   Multiple Vitamins-Iron (CHLORELLA) CAPS   OVER THE COUNTER MEDICATION   OVER THE COUNTER MEDICATION   OVER THE COUNTER MEDICATION   QUERCETIN PO   sodium chloride (OCEAN) 0.65 % SOLN nasal spray   traMADol (ULTRAM) 50 MG tablet   TURMERIC CURCUMIN PO   No current facility-administered medications for this encounter.    sodium chloride flush (NS) 0.9 % injection 10 mL    Myra Gianotti, PA-C Surgical Short Stay/Anesthesiology [facility] Phone [phone] [facility] Phone [phone] 09/14/2022 2:29 PM

## 2022-09-14 NOTE — Assessment & Plan Note (Signed)
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 Neoadjuvant chemotherapy with Herceptin Perjeta and letrozole for 3 cycles.  Reassess with another MRI and then decide if she can continue with the same plan or do we need to add chemotherapy. (Our original recommendation is TCHP x6 cycles but patient is not willing to go through chemo right away) 2. Followed by breast conserving surgery if possible with sentinel lymph node study 3. Followed by adjuvant radiation therapy if patient had lumpectomy 4.  Adjuvant antiestrogen therapy with neratinib -------------------------------------------------------------------------------------------------------------------------- Current Treatment: Cycle 8 Herceptin and Perjeta CT CAP and Bone Scan: 04/16/22: No distant mets Toxicities: Maculopapular rash: Requested a different biosimilar: The rash appears to be coming in sooner but has also improved faster.  08/22/2022: Mammogram and ultrasound: Biopsy-proven malignancy (stable.  2 o'clock position mass is smaller.  There are now 2 abnormal lymph nodes in the left axilla 1 was previously biopsied   Breast MRIs been scheduled for 09/17/2022. Subsequently she will undergo surgery.

## 2022-09-14 NOTE — Patient Instructions (Signed)
[facility]  Discharge Instructions: Thank you for choosing [facility] to provide your oncology and hematology care.   If you have a lab appointment with the [facility], please go directly to the [facility] and check in at the registration area.   Wear comfortable clothing and clothing appropriate for easy access to any Portacath or PICC line.   We strive to give you quality time with your provider. You may need to reschedule your appointment if you arrive late (15 or more minutes).  Arriving late affects you and other patients whose appointments are after yours.  Also, if you miss three or more appointments without notifying the office, you may be dismissed from the clinic at the provider's discretion.      For prescription refill requests, have your pharmacy contact our office and allow 72 hours for refills to be completed.    Today you received the following chemotherapy and/or immunotherapy agents: Trastuzumab (Kanjinti) and Pertuzumab (Perjeta)   To help prevent nausea and vomiting after your treatment, we encourage you to take your nausea medication as directed.  BELOW ARE SYMPTOMS THAT SHOULD BE REPORTED IMMEDIATELY: *FEVER GREATER THAN 100.4 F (38 C) OR HIGHER *CHILLS OR SWEATING *NAUSEA AND VOMITING THAT IS NOT CONTROLLED WITH YOUR NAUSEA MEDICATION *UNUSUAL SHORTNESS OF BREATH *UNUSUAL BRUISING OR BLEEDING *URINARY PROBLEMS (pain or burning when urinating, or frequent urination) *BOWEL PROBLEMS (unusual diarrhea, constipation, pain near the anus) TENDERNESS IN MOUTH AND THROAT WITH OR WITHOUT PRESENCE OF ULCERS (sore throat, sores in mouth, or a toothache) UNUSUAL RASH, SWELLING OR PAIN  UNUSUAL VAGINAL DISCHARGE OR ITCHING   Items with * indicate a potential emergency and should be followed up as soon as possible or go to the Emergency Department if any problems should occur.  Please show the CHEMOTHERAPY ALERT CARD or  IMMUNOTHERAPY ALERT CARD at check-in to the Emergency Department and triage nurse.  Should you have questions after your visit or need to cancel or reschedule your appointment, please contact [facility]  Dept: [phone]  and follow the prompts.  Office hours are 8:00 a.m. to 4:30 p.m. Monday - Friday. Please note that voicemails left after 4:00 p.m. may not be returned until the following business day.  We are closed weekends and major holidays. You have access to a nurse at all times for urgent questions. Please call the main number to the clinic Dept: [phone] and follow the prompts.   For any non-urgent questions, you may also contact your provider using MyChart. We now offer e-Visits for anyone 4 and older to request care online for non-urgent symptoms. For details visit [email or web address].   Also download the MyChart app! Go to the app store, search "MyChart", open the app, select [facility], and log in with your MyChart username and password.  Masks are optional in the cancer centers. If you would like for your care team to wear a mask while they are taking care of you, please let them know. You may have one support person who is at least 59 years old accompany you for your appointments. Trastuzumab Injection What is this medication? TRASTUZUMAB (tras TOO zoo mab) treats breast cancer and stomach cancer. It works by blocking a protein that causes cancer cells to grow and multiply. This helps to slow or stop the spread of cancer cells. This medicine may be used for other purposes; ask your health care provider or pharmacist if you have questions.  COMMON BRAND NAME(S): Herceptin, Janae Bridgeman, Ontruzant, Trazimera What should I tell my care team before I take this medication? They need to know if you have any of these conditions: Heart failure Lung disease An unusual or allergic reaction to trastuzumab, other medications, foods, dyes,  or preservatives Pregnant or trying to get pregnant Breast-feeding How should I use this medication? This medication is injected into a vein. It is given by your care team in a hospital or clinic setting. Talk to your care team about the use of this medication in children. It is not approved for use in children. Overdosage: If you think you have taken too much of this medicine contact a poison control center or emergency room at once. NOTE: This medicine is only for you. Do not share this medicine with others. What if I miss a dose? Keep appointments for follow-up doses. It is important not to miss your dose. Call your care team if you are unable to keep an appointment. What may interact with this medication? Certain types of chemotherapy, such as daunorubicin, doxorubicin, epirubicin, idarubicin This list may not describe all possible interactions. Give your health care provider a list of all the medicines, herbs, non-prescription drugs, or dietary supplements you use. Also tell them if you smoke, drink alcohol, or use illegal drugs. Some items may interact with your medicine. What should I watch for while using this medication? Your condition will be monitored carefully while you are receiving this medication. This medication may make you feel generally unwell. This is not uncommon, as chemotherapy affects healthy cells as well as cancer cells. Report any side effects. Continue your course of treatment even though you feel ill unless your care team tells you to stop. This medication may increase your risk of getting an infection. Call your care team for advice if you get a fever, chills, sore throat, or other symptoms of a cold or flu. Do not treat yourself. Try to avoid being around people who are sick. Avoid taking medications that contain aspirin, acetaminophen, ibuprofen, naproxen, or ketoprofen unless instructed by your care team. These medications can hide a fever. Talk to your care team if  you may be pregnant. Serious birth defects can occur if you take this medication during pregnancy and for 7 months after the last dose. You will need a negative pregnancy test before starting this medication. Contraception is recommended while taking this medication and for 7 months after the last dose. Your care team can help you find the option that works for you. Do not breastfeed while taking this medication and for 7 months after stopping treatment. What side effects may I notice from receiving this medication? Side effects that you should report to your care team as soon as possible: Allergic reactions or angioedema--skin rash, itching or hives, swelling of the face, eyes, lips, tongue, arms, or legs, trouble swallowing or breathing Dry cough, shortness of breath or trouble breathing Heart failure--shortness of breath, swelling of the ankles, feet, or hands, sudden weight gain, unusual weakness or fatigue Infection--fever, chills, cough, or sore throat Infusion reactions--chest pain, shortness of breath or trouble breathing, feeling faint or lightheaded Side effects that usually do not require medical attention (report to your care team if they continue or are bothersome): Diarrhea Dizziness Headache Nausea Trouble sleeping Vomiting This list may not describe all possible side effects. Call your doctor for medical advice about side effects. You may report side effects to FDA at 1-800-FDA-1088. Where should I keep my  medication? This medication is given in a hospital or clinic. It will not be stored at home. NOTE: This sheet is a summary. It may not cover all possible information. If you have questions about this medicine, talk to your doctor, pharmacist, or health care provider.  2023 Elsevier/Gold Standard (2022-03-14 00:00:00) Pertuzumab Injection What is this medication? PERTUZUMAB (per TOOZ ue mab) treats breast cancer. It works by blocking a protein that causes cancer cells to grow  and multiply. This helps to slow or stop the spread of cancer cells. It is a monoclonal antibody. This medicine may be used for other purposes; ask your health care provider or pharmacist if you have questions. COMMON BRAND NAME(S): PERJETA What should I tell my care team before I take this medication? They need to know if you have any of these conditions: Heart failure An unusual or allergic reaction to pertuzumab, other medications, foods, dyes, or preservatives Pregnant or trying to get pregnant Breast-feeding How should I use this medication? This medication is injected into a vein. It is given by your care team in a hospital or clinic setting. Talk to your care team about the use of this medication in children. Special care may be needed. Overdosage: If you think you have taken too much of this medicine contact a poison control center or emergency room at once. NOTE: This medicine is only for you. Do not share this medicine with others. What if I miss a dose? Keep appointments for follow-up doses. It is important not to miss your dose. Call your care team if you are unable to keep an appointment. What may interact with this medication? Interactions are not expected. This list may not describe all possible interactions. Give your health care provider a list of all the medicines, herbs, non-prescription drugs, or dietary supplements you use. Also tell them if you smoke, drink alcohol, or use illegal drugs. Some items may interact with your medicine. What should I watch for while using this medication? Your condition will be monitored carefully while you are receiving this medication. This medication may make you feel generally unwell. This is not uncommon as chemotherapy can affect healthy cells as well as cancer cells. Report any side effects. Continue your course of treatment even though you feel ill unless your care team tells you to stop. Talk to your care team if you may be pregnant.  Serious birth defects can occur if you take this medication during pregnancy and for 7 months after the last dose. You will need a negative pregnancy test before starting this medication. Contraception is recommended while taking this medication and for 7 months after the last dose. Your care team can help you find the option that works for you. Do not breastfeed while taking this medication and for 7 months after the last dose. What side effects may I notice from receiving this medication? Side effects that you should report to your care team as soon as possible: Allergic reactions or angioedema--skin rash, itching or hives, swelling of the face, eyes, lips, tongue, arms, or legs, trouble swallowing or breathing Heart failure--shortness of breath, swelling of the ankles, feet, or hands, sudden weight gain, unusual weakness or fatigue Infusion reactions--chest pain, shortness of breath or trouble breathing, feeling faint or lightheaded Side effects that usually do not require medical attention (report to your care team if they continue or are bothersome): Diarrhea Dry skin Fatigue Hair loss Nausea Vomiting This list may not describe all possible side effects. Call your doctor  for medical advice about side effects. You may report side effects to FDA at 1-800-FDA-1088. Where should I keep my medication? This medication is given in a hospital or clinic. It will not be stored at home. NOTE: This sheet is a summary. It may not cover all possible information. If you have questions about this medicine, talk to your doctor, pharmacist, or health care provider.  2023 Elsevier/Gold Standard (2022-03-02 00:00:00)

## 2022-09-15 ENCOUNTER — Ambulatory Visit: Payer: BC Managed Care – PPO | Admitting: Hematology and Oncology

## 2022-09-15 ENCOUNTER — Other Ambulatory Visit: Payer: BC Managed Care – PPO

## 2022-09-15 ENCOUNTER — Ambulatory Visit: Payer: BC Managed Care – PPO

## 2022-09-17 ENCOUNTER — Ambulatory Visit
Admission: RE | Admit: 2022-09-17 | Discharge: 2022-09-17 | Disposition: A | Discharge status: Home / Self Care | Payer: BC Managed Care – PPO | Source: Ambulatory Visit | Attending: Surgery | Admitting: Surgery

## 2022-09-17 DIAGNOSIS — Z171 Estrogen receptor negative status [ER-]: Secondary | ICD-10-CM

## 2022-09-17 MED ORDER — GADOPICLENOL 0.5 MMOL/ML IV SOLN
5.0000 mL | Freq: Once | INTRAVENOUS | Status: AC | PRN
  Administered 2022-09-17: 5 mL via INTRAVENOUS

## 2022-09-18 ENCOUNTER — Encounter: Payer: Self-pay | Admitting: *Deleted

## 2022-09-18 DIAGNOSIS — C50412 Malignant neoplasm of upper-outer quadrant of left female breast: Secondary | ICD-10-CM

## 2022-09-19 ENCOUNTER — Ambulatory Visit
Admission: RE | Admit: 2022-09-19 | Discharge: 2022-09-19 | Disposition: A | Discharge status: Home / Self Care | Payer: BC Managed Care – PPO | Source: Ambulatory Visit | Attending: Surgery | Admitting: Surgery

## 2022-09-19 ENCOUNTER — Ambulatory Visit: Payer: Self-pay | Admitting: Surgery

## 2022-09-19 DIAGNOSIS — C50912 Malignant neoplasm of unspecified site of left female breast: Secondary | ICD-10-CM

## 2022-09-19 HISTORY — PX: BREAST BIOPSY: SHX20

## 2022-09-20 ENCOUNTER — Ambulatory Visit
Admission: RE | Admit: 2022-09-20 | Discharge: 2022-09-20 | Disposition: A | Discharge status: Home / Self Care | Payer: BC Managed Care – PPO | Source: Ambulatory Visit | Attending: Surgery | Admitting: Surgery

## 2022-09-20 ENCOUNTER — Ambulatory Visit (HOSPITAL_COMMUNITY): Payer: BC Managed Care – PPO | Admitting: Anesthesiology

## 2022-09-20 ENCOUNTER — Ambulatory Visit (HOSPITAL_COMMUNITY)
Admission: RE | Admit: 2022-09-20 | Discharge: 2022-09-20 | Disposition: A | Discharge status: Home / Self Care | Payer: BC Managed Care – PPO | Attending: Surgery | Admitting: Surgery

## 2022-09-20 ENCOUNTER — Ambulatory Visit (HOSPITAL_COMMUNITY): Payer: BC Managed Care – PPO | Admitting: Vascular Surgery

## 2022-09-20 ENCOUNTER — Encounter (HOSPITAL_COMMUNITY): Payer: Self-pay | Admitting: Surgery

## 2022-09-20 ENCOUNTER — Other Ambulatory Visit: Payer: Self-pay

## 2022-09-20 ENCOUNTER — Encounter (HOSPITAL_COMMUNITY)
Admission: RE | Disposition: A | Discharge status: Home / Self Care | Payer: Self-pay | Source: Home / Self Care | Attending: Surgery

## 2022-09-20 DIAGNOSIS — C50412 Malignant neoplasm of upper-outer quadrant of left female breast: Secondary | ICD-10-CM | POA: Diagnosis present

## 2022-09-20 DIAGNOSIS — Z171 Estrogen receptor negative status [ER-]: Secondary | ICD-10-CM | POA: Insufficient documentation

## 2022-09-20 DIAGNOSIS — C50912 Malignant neoplasm of unspecified site of left female breast: Secondary | ICD-10-CM

## 2022-09-20 DIAGNOSIS — C773 Secondary and unspecified malignant neoplasm of axilla and upper limb lymph nodes: Secondary | ICD-10-CM | POA: Diagnosis not present

## 2022-09-20 HISTORY — PX: BREAST LUMPECTOMY WITH RADIOACTIVE SEED AND AXILLARY LYMPH NODE DISSECTION: SHX6656

## 2022-09-20 SURGERY — BREAST LUMPECTOMY WITH RADIOACTIVE SEED AND AXILLARY LYMPH NODE DISSECTION
Anesthesia: General | Site: Breast | Laterality: Left

## 2022-09-20 MED ORDER — SODIUM CHLORIDE 0.9 % IV SOLN
INTRAVENOUS | Status: DC | PRN
  Administered 2022-09-20: 500 mL

## 2022-09-20 MED ORDER — BUPIVACAINE-EPINEPHRINE 0.25% -1:200000 IJ SOLN
INTRAMUSCULAR | Status: DC | PRN
  Administered 2022-09-20: 10 mL

## 2022-09-20 MED ORDER — DEXAMETHASONE SODIUM PHOSPHATE 10 MG/ML IJ SOLN
INTRAMUSCULAR | Status: DC | PRN
  Administered 2022-09-20: 4 mg via INTRAVENOUS

## 2022-09-20 MED ORDER — FENTANYL CITRATE (PF) 100 MCG/2ML IJ SOLN
INTRAMUSCULAR | Status: DC | PRN
  Administered 2022-09-20: 50 ug via INTRAVENOUS

## 2022-09-20 MED ORDER — OXYCODONE HCL 5 MG/5ML PO SOLN: 5.0000 mg | Freq: Once | ORAL | Status: DC | PRN

## 2022-09-20 MED ORDER — EPHEDRINE SULFATE-NACL 50-0.9 MG/10ML-% IV SOSY
PREFILLED_SYRINGE | INTRAVENOUS | Status: DC | PRN
  Administered 2022-09-20: 10 mg via INTRAVENOUS

## 2022-09-20 MED ORDER — CHLORHEXIDINE GLUCONATE 0.12 % MT SOLN
15.0000 mL | Freq: Once | OROMUCOSAL | Status: AC
  Administered 2022-09-20: 15 mL via OROMUCOSAL
  Filled 2022-09-20: qty 15

## 2022-09-20 MED ORDER — CHLORHEXIDINE GLUCONATE CLOTH 2 % EX PADS: 6.0000 | MEDICATED_PAD | Freq: Once | CUTANEOUS | Status: DC

## 2022-09-20 MED ORDER — MIDAZOLAM HCL 2 MG/2ML IJ SOLN
INTRAMUSCULAR | Status: AC
  Filled 2022-09-20: qty 2

## 2022-09-20 MED ORDER — PROPOFOL 10 MG/ML IV BOLUS
INTRAVENOUS | Status: AC
  Filled 2022-09-20: qty 20

## 2022-09-20 MED ORDER — FENTANYL CITRATE (PF) 250 MCG/5ML IJ SOLN
INTRAMUSCULAR | Status: AC
  Filled 2022-09-20: qty 5

## 2022-09-20 MED ORDER — MIDAZOLAM HCL 2 MG/2ML IJ SOLN
INTRAMUSCULAR | Status: DC | PRN
  Administered 2022-09-20: 2 mg via INTRAVENOUS

## 2022-09-20 MED ORDER — AMISULPRIDE (ANTIEMETIC) 5 MG/2ML IV SOLN: 10.0000 mg | Freq: Once | INTRAVENOUS | Status: DC | PRN

## 2022-09-20 MED ORDER — ONDANSETRON HCL 4 MG/2ML IJ SOLN
INTRAMUSCULAR | Status: DC | PRN
  Administered 2022-09-20: 4 mg via INTRAVENOUS

## 2022-09-20 MED ORDER — PHENYLEPHRINE 80 MCG/ML (10ML) SYRINGE FOR IV PUSH (FOR BLOOD PRESSURE SUPPORT)
PREFILLED_SYRINGE | INTRAVENOUS | Status: DC | PRN
  Administered 2022-09-20 (×3): 80 ug via INTRAVENOUS

## 2022-09-20 MED ORDER — ONDANSETRON HCL 4 MG PO TABS: 4.0000 mg | ORAL_TABLET | Freq: Three times a day (TID) | ORAL | 0 refills | Status: DC | PRN

## 2022-09-20 MED ORDER — PROPOFOL 10 MG/ML IV BOLUS
INTRAVENOUS | Status: DC | PRN
  Administered 2022-09-20: 120 mg via INTRAVENOUS

## 2022-09-20 MED ORDER — LIDOCAINE 2% (20 MG/ML) 5 ML SYRINGE
INTRAMUSCULAR | Status: AC
  Filled 2022-09-20: qty 5

## 2022-09-20 MED ORDER — EPHEDRINE 5 MG/ML INJ
INTRAVENOUS | Status: AC
  Filled 2022-09-20: qty 5

## 2022-09-20 MED ORDER — GABAPENTIN 300 MG PO CAPS
300.0000 mg | ORAL_CAPSULE | ORAL | Status: AC
  Administered 2022-09-20: 300 mg via ORAL
  Filled 2022-09-20: qty 1

## 2022-09-20 MED ORDER — 0.9 % SODIUM CHLORIDE (POUR BTL) OPTIME: TOPICAL | Status: DC | PRN

## 2022-09-20 MED ORDER — OXYCODONE HCL 5 MG PO TABS: 5.0000 mg | ORAL_TABLET | Freq: Four times a day (QID) | ORAL | 0 refills | Status: DC | PRN

## 2022-09-20 MED ORDER — DEXAMETHASONE SODIUM PHOSPHATE 10 MG/ML IJ SOLN
INTRAMUSCULAR | Status: AC
  Filled 2022-09-20: qty 1

## 2022-09-20 MED ORDER — HEMOSTATIC AGENTS (NO CHARGE) OPTIME
TOPICAL | Status: DC | PRN
  Administered 2022-09-20: 1 via TOPICAL

## 2022-09-20 MED ORDER — FENTANYL CITRATE (PF) 100 MCG/2ML IJ SOLN
25.0000 ug | INTRAMUSCULAR | Status: DC | PRN
  Administered 2022-09-20 (×2): 50 ug via INTRAVENOUS

## 2022-09-20 MED ORDER — FENTANYL CITRATE (PF) 100 MCG/2ML IJ SOLN
INTRAMUSCULAR | Status: AC
  Filled 2022-09-20: qty 2

## 2022-09-20 MED ORDER — PHENYLEPHRINE 80 MCG/ML (10ML) SYRINGE FOR IV PUSH (FOR BLOOD PRESSURE SUPPORT)
PREFILLED_SYRINGE | INTRAVENOUS | Status: AC
  Filled 2022-09-20: qty 10

## 2022-09-20 MED ORDER — OXYCODONE HCL 5 MG PO TABS: 5.0000 mg | ORAL_TABLET | Freq: Once | ORAL | Status: DC | PRN

## 2022-09-20 MED ORDER — BUPIVACAINE-EPINEPHRINE (PF) 0.25% -1:200000 IJ SOLN
INTRAMUSCULAR | Status: AC
  Filled 2022-09-20: qty 30

## 2022-09-20 MED ORDER — ORAL CARE MOUTH RINSE: 15.0000 mL | Freq: Once | OROMUCOSAL | Status: AC

## 2022-09-20 MED ORDER — ROPIVACAINE HCL 5 MG/ML IJ SOLN
INTRAMUSCULAR | Status: DC | PRN
  Administered 2022-09-20: 30 mL via PERINEURAL

## 2022-09-20 MED ORDER — CEFAZOLIN SODIUM-DEXTROSE 2-4 GM/100ML-% IV SOLN
2.0000 g | INTRAVENOUS | Status: AC
  Administered 2022-09-20: 2 g via INTRAVENOUS
  Filled 2022-09-20: qty 100

## 2022-09-20 MED ORDER — ONDANSETRON HCL 4 MG/2ML IJ SOLN: 4.0000 mg | Freq: Once | INTRAMUSCULAR | Status: DC | PRN

## 2022-09-20 MED ORDER — ONDANSETRON HCL 4 MG/2ML IJ SOLN
INTRAMUSCULAR | Status: AC
  Filled 2022-09-20: qty 2

## 2022-09-20 MED ORDER — LACTATED RINGERS IV SOLN: INTRAVENOUS | Status: DC

## 2022-09-20 MED ORDER — LIDOCAINE 2% (20 MG/ML) 5 ML SYRINGE
INTRAMUSCULAR | Status: DC | PRN
  Administered 2022-09-20: 40 mg via INTRAVENOUS

## 2022-09-20 SURGICAL SUPPLY — 48 items
ADH SKN CLS APL DERMABOND .7 (GAUZE/BANDAGES/DRESSINGS) ×1
APL PRP STRL LF DISP 70% ISPRP (MISCELLANEOUS) ×1
APPLIER CLIP 9.375 MED OPEN (MISCELLANEOUS) ×2
APR CLP MED 9.3 20 MLT OPN (MISCELLANEOUS) ×2
BAG COUNTER SPONGE SURGICOUNT (BAG) IMPLANT
BAG SPNG CNTER NS LX DISP (BAG) ×1
BINDER BREAST LRG (GAUZE/BANDAGES/DRESSINGS) IMPLANT
BINDER BREAST XLRG (GAUZE/BANDAGES/DRESSINGS) IMPLANT
CANISTER SUCT 3000ML PPV (MISCELLANEOUS) ×1 IMPLANT
CHLORAPREP W/TINT 26 (MISCELLANEOUS) ×1 IMPLANT
CLIP APPLIE 9.375 MED OPEN (MISCELLANEOUS) ×1 IMPLANT
CNTNR URN SCR LID CUP LEK RST (MISCELLANEOUS) ×1 IMPLANT
CONT SPEC 4OZ STRL OR WHT (MISCELLANEOUS) ×1
COVER PROBE W GEL 5X96 (DRAPES) ×2 IMPLANT
COVER SURGICAL LIGHT HANDLE (MISCELLANEOUS) ×1 IMPLANT
DERMABOND ADVANCED .7 DNX12 (GAUZE/BANDAGES/DRESSINGS) ×1 IMPLANT
DEVICE DUBIN SPECIMEN MAMMOGRA (MISCELLANEOUS) ×1 IMPLANT
DRAIN CHANNEL 19F RND (DRAIN) IMPLANT
DRAPE CHEST BREAST 15X10 FENES (DRAPES) ×1 IMPLANT
ELECT CAUTERY BLADE 6.4 (BLADE) ×1 IMPLANT
ELECT REM PT RETURN 9FT ADLT (ELECTROSURGICAL) ×1
ELECTRODE REM PT RTRN 9FT ADLT (ELECTROSURGICAL) ×1 IMPLANT
EVACUATOR SILICONE 100CC (DRAIN) IMPLANT
GAUZE SPONGE 4X4 12PLY STRL (GAUZE/BANDAGES/DRESSINGS) IMPLANT
GLOVE BIO SURGEON STRL SZ8 (GLOVE) ×1 IMPLANT
GLOVE BIOGEL PI IND STRL 8 (GLOVE) ×1 IMPLANT
GOWN STRL REUS W/ TWL LRG LVL3 (GOWN DISPOSABLE) ×1 IMPLANT
GOWN STRL REUS W/ TWL XL LVL3 (GOWN DISPOSABLE) ×1 IMPLANT
GOWN STRL REUS W/TWL LRG LVL3 (GOWN DISPOSABLE) ×1
GOWN STRL REUS W/TWL XL LVL3 (GOWN DISPOSABLE) ×1
HEMOSTAT ARISTA ABSORB 3G PWDR (HEMOSTASIS) IMPLANT
KIT BASIN OR (CUSTOM PROCEDURE TRAY) ×1 IMPLANT
KIT MARKER MARGIN INK (KITS) ×1 IMPLANT
LIGHT WAVEGUIDE WIDE FLAT (MISCELLANEOUS) IMPLANT
NDL 18GX1X1/2 (RX/OR ONLY) (NEEDLE) IMPLANT
NDL FILTER BLUNT 18X1 1/2 (NEEDLE) IMPLANT
NDL HYPO 25GX1X1/2 BEV (NEEDLE) ×1 IMPLANT
NEEDLE 18GX1X1/2 (RX/OR ONLY) (NEEDLE) IMPLANT
NEEDLE FILTER BLUNT 18X1 1/2 (NEEDLE) IMPLANT
NEEDLE HYPO 25GX1X1/2 BEV (NEEDLE) ×1 IMPLANT
NS IRRIG 1000ML POUR BTL (IV SOLUTION) ×1 IMPLANT
PACK GENERAL/GYN (CUSTOM PROCEDURE TRAY) ×1 IMPLANT
SUT ETHILON 2 0 FS 18 (SUTURE) IMPLANT
SUT MNCRL AB 4-0 PS2 18 (SUTURE) ×1 IMPLANT
SUT VIC AB 3-0 SH 18 (SUTURE) ×1 IMPLANT
SYR CONTROL 10ML LL (SYRINGE) ×1 IMPLANT
TOWEL GREEN STERILE (TOWEL DISPOSABLE) ×1 IMPLANT
TOWEL GREEN STERILE FF (TOWEL DISPOSABLE) ×1 IMPLANT

## 2022-09-20 NOTE — Anesthesia Procedure Notes (Signed)
Procedure Name: LMA Insertion Date/Time: 09/20/2022 8:38 AM  Performed by: Niel Hummer, CRNAPre-anesthesia Checklist: Patient identified, Emergency Drugs available, Suction available and Patient being monitored Patient Re-evaluated:Patient Re-evaluated prior to induction Oxygen Delivery Method: Circle system utilized Preoxygenation: Pre-oxygenation with 100% oxygen Induction Type: IV induction LMA: LMA inserted LMA Size: 4.0 Number of attempts: 1 Dental Injury: Teeth and Oropharynx as per pre-operative assessment

## 2022-09-20 NOTE — Anesthesia Procedure Notes (Signed)
Anesthesia Regional Block: Pectoralis block   Pre-Anesthetic Checklist: , timeout performed,  Correct Patient, Correct Site, Correct Laterality,  Correct Procedure, Correct Position, site marked,  Risks and benefits discussed,  Surgical consent,  Pre-op evaluation,  At surgeon's request and post-op pain management  Laterality: Left  Prep: chloraprep       Needles:  Injection technique: Single-shot  Needle Type: Echogenic Stimulator Needle     Needle Length: 10cm  Needle Gauge: 20     Additional Needles:   Procedures:,,,, ultrasound used (permanent image in chart),,    Narrative:  Start time: 09/20/2022 7:35 AM End time: 09/20/2022 7:39 AM Injection made incrementally with aspirations every 5 mL.  Performed by: Personally  Anesthesiologist: Lidia Collum, MD

## 2022-09-20 NOTE — Op Note (Addendum)
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  Postoperative diagnosis: Same  Procedure: Left breast seed localized lumpectomy utilizing 2 seeds for bracketed approach and left axillary lymph node dissection  Surgeon: Erroll Luna, MD  Anesthesia: LMA with pectoral block and 0.25% Marcaine with epinephrine  EBL: 20 cc  Specimen: Left breast tissue with 2 seeds and 2 clips verified by Faxitron with grossly negative margins and left extra contents to pathology  Drains: 19 French  IV fluids: Per anesthesia record  Indications for procedure: The patient is a 61 year old female who has undergone neoadjuvant chemotherapy for stage II left breast cancer.  Unfortunate, she was not able to finish her regimen and actually was somewhat noncompliant with her regimen.  Her disease in her breast got smaller but her lymph node disease began to increase while on chemotherapy.  Given the incomplete response as well as incomplete participation in her therapy, we recommended proceeding with surgery at this point in time especially with lymph node dissection since the disease in her and her lymph node seem to be increasing at least radiographically and clinically.  We discussed lumpectomy as well.  Discussed procedures that could be alternatives which would include a mastectomy with reconstruction.  She went to proceed with breast conserving surgery.The procedure has been discussed with the patient. Alternatives to surgery have been discussed with the patient.  Risks of surgery include bleeding,  Infection,  Seroma formation, death,  and the need for further surgery.   The patient understands and wishes to proceed.  Lymph nod dissection has been discussed with the patient.  Risk of bleeding,  Infection, lymphedema seroma formation,  Additional procedures,,  Shoulder weakness ,  Shoulder stiffness,  Nerve and blood vessel injury and reaction to the mapping dyes have been discussed.   Alternatives to surgery have been discussed with the patient.  The patient agrees to proceed.   Description of procedure: The patient was met in the holding area and questions were answered.  Left breast was marked as correct site and pectoral block placed by anesthesia.  Of note seizure placed as an outpatient under left breast.  Films were available for review.  She was then taken back to the operating room.  She was placed upon upon the operating room table.  After induction of general esthesia, the left breast and left axilla were prepped and draped in sterile fashion and timeout performed.  Films were available in the operating room as well.  She received appropriate preoperative antibiotics.  Neoprobe was used to identify both seeds in the left breast upper outer quadrant.  These were marked with a pen.  Curvilinear incision was made over both signals.  Dissection was carried around both seeds and both clips with grossly negative margins.  The specimen was oriented with ink.  The Faxitron image revealed both seeds and clips to be present.  The cavities made hemostatic with cautery.  It was irrigated.  Local anesthetic infiltrated and the cavity closed with a deep layer 3-0 Vicryl.  4 Monocryl was used to close the skin in a subcuticular fashion.  Local anesthetic was infiltrated along the inferior border of the axillary hairline.  Transverse incision was made.  Dissection was carried down into the level 1 contents.  Retractors were placed.  All lymphovascular tissue from the axillary vein, thoracodorsal trunk and long thoracic nerves were exposed removed with a combination of clips and cautery.  The 3 structures were preserved.  She has significant clinical adenopathy  noted.  Once this area is removed the axilla was examined.  There is no other lymphovascular tissue to remove.  I cannot palpate any significant level 2 or level 3 adenopathy.  Irrigation was used.  Arista was placed.  Hemostasis achieved.   Through a separate stab incision 19 drain was placed and secured to the skin with 2-0 nylon.  The subcutaneous space was then closed with 3-0 Vicryl.  4 Monocryl was used to close the skin in a subcuticular fashion.  Dermabond applied.  All counts found to be correct.  Breast binder placed.  Bulb placed to suction.  Patient was then awoke extubated taken to recovery in satisfactory condition. Marland Kitchenc

## 2022-09-20 NOTE — Discharge Instructions (Signed)
[facility] Office Phone Number [phone]  BREAST BIOPSY/ PARTIAL MASTECTOMY: POST OP INSTRUCTIONS  Always review your discharge instruction sheet given to you by the facility where your surgery was performed.  IF YOU HAVE DISABILITY OR FAMILY LEAVE FORMS, YOU MUST BRING THEM TO THE OFFICE FOR PROCESSING.  DO NOT GIVE THEM TO YOUR DOCTOR.  A prescription for pain medication may be given to you upon discharge.  Take your pain medication as prescribed, if needed.  If narcotic pain medicine is not needed, then you may take acetaminophen (Tylenol) or ibuprofen (Advil) as needed. Take your usually prescribed medications unless otherwise directed If you need a refill on your pain medication, please contact your pharmacy.  They will contact our office to request authorization.  Prescriptions will not be filled after 5pm or on week-ends. You should eat very light the first 24 hours after surgery, such as soup, crackers, pudding, etc.  Resume your normal diet the day after surgery. Most patients will experience some swelling and bruising in the breast.  Ice packs and a good support bra will help.  Swelling and bruising can take several days to resolve.  It is common to experience some constipation if taking pain medication after surgery.  Increasing fluid intake and taking a stool softener will usually help or prevent this problem from occurring.  A mild laxative (Milk of Magnesia or Miralax) should be taken according to package directions if there are no bowel movements after 48 hours. Unless discharge instructions indicate otherwise, you may remove your bandages 24-48 hours after surgery, and you may shower at that time.  You may have steri-strips (small skin tapes) in place directly over the incision.  These strips should be left on the skin for 7-10 days.  If your surgeon used skin glue on the incision, you may shower in 24 hours.  The glue will flake off over the next 2-3 weeks.  Any  sutures or staples will be removed at the office during your follow-up visit. ACTIVITIES:  You may resume regular daily activities (gradually increasing) beginning the next day.  Wearing a good support bra or sports bra minimizes pain and swelling.  You may have sexual intercourse when it is comfortable. You may drive when you no longer are taking prescription pain medication, you can comfortably wear a seatbelt, and you can safely maneuver your car and apply brakes. RETURN TO WORK:  ______________________________________________________________________________________ You should see your doctor in the office for a follow-up appointment approximately two weeks after your surgery.  Your doctor's nurse will typically make your follow-up appointment when she calls you with your pathology report.  Expect your pathology report 2-3 business days after your surgery.  You may call to check if you do not hear from us after three days. OTHER INSTRUCTIONS: _______________________________________________________________________________________________ _____________________________________________________________________________________________________________________________________ _____________________________________________________________________________________________________________________________________ _____________________________________________________________________________________________________________________________________  WHEN TO CALL YOUR DOCTOR: Fever over 101.0 Nausea and/or vomiting. Extreme swelling or bruising. Continued bleeding from incision. Increased pain, redness, or drainage from the incision.  The clinic staff is available to answer your questions during regular business hours.  Please don't hesitate to call and ask to speak to one of the nurses for clinical concerns.  If you have a medical emergency, go to the nearest emergency room or call 911.  A surgeon from [facility] is always on call at the hospital.  For further questions, please visit [email or web address]   

## 2022-09-20 NOTE — Interval H&P Note (Signed)
History and Physical Interval Note:  09/20/2022 8:12 AM  Caitlyn Torres  has presented today for surgery, with the diagnosis of LEFT BREAST CANCER.  The various methods of treatment have been discussed with the patient and family. After consideration of risks, benefits and other options for treatment, the patient has consented to  Procedure(s) with comments: LEFT BREAST SEED LUMPECTOMY, LEFT AXILLARY LYMPH NODE DISSECTION (Left) - GEN & PEC BLOCK as a surgical intervention.  The patient's history has been reviewed, patient examined, no change in status, stable for surgery.  I have reviewed the patient's chart and labs.  Questions were answered to the patient's satisfaction.     [place]

## 2022-09-20 NOTE — H&P (Signed)
story of Present Illness: Caitlyn Torres is a 61 y.o. female who is seen today for long-term follow-up left breast cancer. She was diagnosed 4 months ago with stage II left breast cancer. She was HER2/neu positive but she opted out chemotherapy except for Herceptin and then letrozole. She had a port placed. She had a good response initially to the chemotherapy in her left breast but her axilla showed progression of disease on most recent imaging. She was sent to discuss definitive surgical treatment. Again she did not receive the standard chemotherapy regimen and opted only for Herceptin with an aromatase inhibitor. She feels well. She has many questions as does her husband today..    Review of Systems: A complete review of systems was obtained from the patient. I have reviewed this information and discussed as appropriate with the patient. See HPI as well for other ROS.    Medical History: Past Medical History:  Diagnosis Date  Anxiety  Arthritis  History of cancer   There is no problem list on file for this patient.  Past Surgical History:  Procedure Laterality Date  CAUTERIZATION CERVIX BY LASER 1989  Skin Cancer 08/2020    Allergies  Allergen Reactions  Acetaminophen Swelling  Aspirin Swelling  Nsaids (Non-Steroidal Anti-Inflammatory Drug) Swelling  Cpm-Diphenhyd-Pse-Acetaminophn Hives  Ibuprofen Unknown  Macrolide Antibiotics Other (See Comments)  Flu like sx  Naproxen Sodium Unknown  Nitrofurantoin Monohyd/M-Cryst Other (See Comments) and Unknown  Flu like symptoms, fever 103   Current Outpatient Medications on File Prior to Visit  Medication Sig Dispense Refill  biotin 10 mg Tab Take by mouth  cetirizine (ZYRTEC) 10 MG tablet Take 10 mg by mouth once daily  EPINEPHrine (EPIPEN) 0.3 mg/0.3 mL auto-injector as directed  multivitamin tablet Take 1 tablet by mouth once daily  oxyCODONE (ROXICODONE) 5 MG immediate release tablet Take 1 tablet (5 mg total) by mouth  every 6 (six) hours as needed for Pain (severe pain) 15 tablet 0   No current facility-administered medications on file prior to visit.   No family history on file.   Social History   Tobacco Use  Smoking Status Former  Types: Cigarettes  Smokeless Tobacco Never    Social History   Socioeconomic History  Marital status: Life Partner  Tobacco Use  Smoking status: Former  Types: Cigarettes  Smokeless tobacco: Never  Substance and Sexual Activity  Alcohol use: Yes  Drug use: Never   Objective:   There were no vitals filed for this visit.  There is no height or weight on file to calculate BMI.  Physical Exam Eyes:  Pupils: Pupils are equal, round, and reactive to light.  Cardiovascular:  Rate and Rhythm: Normal rate.  Pulmonary:  Effort: Pulmonary effort is normal.  Breath sounds: No stridor.  Chest:  Breasts: Right: Normal.   Comments: Left breast tumor is much smaller on the scale of 1 cm. Multiple left axillary nodes. 2 are very prominent. No other nodes noted. Right axilla is normal  Port in place Musculoskeletal:  General: Normal range of motion.  Cervical back: Normal range of motion.  Lymphadenopathy:  Upper Body:  Right upper body: No supraclavicular or axillary adenopathy.  Left upper body: Axillary adenopathy present. No supraclavicular adenopathy.  Skin: General: Skin is warm.  Neurological:  General: No focal deficit present.  Mental Status: She is alert.  Psychiatric:  Mood and Affect: Mood normal.     Labs, Imaging and Diagnostic Testing:  CLINICAL DATA: 61 year old female presenting for follow-up of  biopsy proven malignancy at 2 sites in the upper-outer left breast with a metastatic left axillary lymph node. The patient is presenting to assess response to treatment.  EXAM: DIGITAL DIAGNOSTIC UNILATERAL LEFT MAMMOGRAM WITH TOMOSYNTHESIS; ULTRASOUND LEFT BREAST LIMITED  TECHNIQUE: Left digital diagnostic mammography and breast  tomosynthesis was performed.; Targeted ultrasound examination of the left breast was performed.  COMPARISON: Previous exam(s).  ACR Breast Density Category c: The breast tissue is heterogeneously dense, which may obscure small masses.  FINDINGS: Ultrasound of the left breast at 2:30, 8 cm from the nipple demonstrates that the biopsy proven malignancy measures 0.8 x 0.5 x 0.7 cm, previously 0.8 x 0.4 x 0.7 cm on 06/20/2022, and 1.3 x 0.5 x 0.8 cm pre biopsy on 02/28/2022.  Ultrasound of the left breast at 2 o'clock, 8 cm from the nipple demonstrates the biopsy-proven malignancy measures 0.8 x 0.7 x 0.7 cm, previously 1.0 x 0.5 x 1.2 cm on 06/20/2022, and 1.5 x 0.9 x 1.3 cm pre biopsy on 02/28/2022.  Pre biopsy, there was 1 documented abnormal left axillary lymph node which was biopsied showing metastatic disease. This lymph node has significantly increased in size by the prior ultrasound 06/20/2022. The lymph node appears stable compared to the prior ultrasound in August.  An additional abnormal lymph node core spanning 1.1 cm is noted superior to the biopsy proven metastatic lymph node.  IMPRESSION: 1. The biopsy proven malignancy in the left breast at 2:30 is stable compared to the exam in August.  2. The biopsy proven malignancy at 2 o'clock is slightly smaller than the exam in August.  3. There are now 2 abnormal lymph nodes in the left axilla, one of which was previously biopsied demonstrating metastatic disease.  RECOMMENDATION: Clinical follow-up for treatment plan.  I have discussed the findings and recommendations with the patient. If applicable, a reminder letter will be sent to the patient regarding the next appointment.  BI-RADS CATEGORY 6: Known biopsy-proven malignancy.   Electronically Signed By: Ammie Ferrier M.D. On: 08/22/2022 09:57  Assessment and Plan:   Diagnoses and all orders for this visit:  Malignant neoplasm of upper-outer quadrant  of left breast in female, estrogen receptor negative  - MRI breast bilateral with and without contrast    Long discussion about treatment options. She did not take the standard chemotherapy regimen for her tumor. She has had a response but there is concerned the disease in the axilla has progressed. Long discussion about standard of care with this.Discussed targeted node with sentinel node mapping. Concern is that she has not had standard therapy and the likelihood of disease being present in the axilla postchemotherapy is high based on her clinical exam. Recommend MRI for surgical planning. The tumor in the breast is much smaller therefore think lumpectomy is feasible which is what she wants to do. We discussed the need for radiation therapy. I discussed the pros and cons of lymph node dissection versus targeted node and lymph node mapping. Given the persistence of gross disease, I do not feel it is prudent to target and or map this and given the fact she has not been compliant with standard chemotherapy recommendations, I have concerns about her follow-up with radiation therapy as well. Therefore, I feel the best course of action is to proceed with left breast lumpectomy with left axillary lymph node dissection. I have encouraged her that if she does have breast conserving surgery she will need radiation therapy. She does not want a mastectomy and refuses to  have that done. Hopefully she will be compliant going forward to maximize the likelihood of success of treating her cancer. I explained this at great length to her husband and the patient today. Risk of lymphedema discussed. Risk of bleeding, infection, cosmetic deformity, and the need for the treatment standard procedures reviewed with the patient. Alternative measures of mastectomy reconstruction discussed. She is quite adamant about proceeding with breast conserving surgery and given her response in the breast I feel this is reasonable to proceed. No  follow-ups on file.  Kennieth Francois, MD

## 2022-09-20 NOTE — Transfer of Care (Signed)
Immediate Anesthesia Transfer of Care Note  Patient: [name]  Procedure(s) Performed: LEFT BREAST SEED LUMPECTOMY, LEFT AXILLARY LYMPH NODE DISSECTION (Left: Breast)  Patient Location: PACU  Anesthesia Type:General  Level of Consciousness: awake, alert , and oriented  Airway & Oxygen Therapy: Patient Spontanous Breathing and Patient connected to face mask oxygen  Post-op Assessment: Report given to RN, Post -op Vital signs reviewed and stable, and Patient moving all extremities X 4  Post vital signs: Reviewed and stable  Last Vitals:  Vitals Value Taken Time  BP 99/86 09/20/22 1004  Temp    Pulse 89 09/20/22 1005  Resp 12 09/20/22 1004  SpO2 100 % 09/20/22 1005  Vitals shown include unvalidated device data.  Last Pain:  Vitals:   09/20/22 1004  TempSrc:   PainSc: Asleep      Patients Stated Pain Goal: 0 (39/67/28 [id number])  Complications: No notable events documented.

## 2022-09-20 NOTE — Anesthesia Postprocedure Evaluation (Signed)
Anesthesia Post Note  Patient: Caitlyn Torres  Procedure(s) Performed: LEFT BREAST SEED LUMPECTOMY, LEFT AXILLARY LYMPH NODE DISSECTION (Left: Breast)     Patient location during evaluation: PACU Anesthesia Type: General Level of consciousness: awake and alert Pain management: pain level controlled Vital Signs Assessment: post-procedure vital signs reviewed and stable Respiratory status: spontaneous breathing, nonlabored ventilation and respiratory function stable Cardiovascular status: blood pressure returned to baseline and stable Postop Assessment: no apparent nausea or vomiting Anesthetic complications: no   No notable events documented.  Last Vitals:  Vitals:   09/20/22 1034 09/20/22 1049  BP: 132/72 (!) 148/66  Pulse: 86 85  Resp: 17 13  Temp:  36.6 C  SpO2: 98% 97%    Last Pain:  Vitals:   09/20/22 1049  TempSrc:   PainSc: 2                  Lidia Collum

## 2022-09-21 ENCOUNTER — Encounter (HOSPITAL_COMMUNITY): Payer: Self-pay | Admitting: Surgery

## 2022-09-26 ENCOUNTER — Encounter: Payer: Self-pay | Admitting: Surgery

## 2022-09-26 ENCOUNTER — Encounter: Payer: Self-pay | Admitting: *Deleted

## 2022-09-26 LAB — SURGICAL PATHOLOGY

## 2022-10-01 NOTE — Progress Notes (Signed)
Patient Care Team: Bradd Canary, MD as PCP - General (Family Medicine) Mauro Kaufmann, RN as Oncology Nurse Navigator Rockwell Germany, RN as Oncology Nurse Navigator Nicholas Lose, MD as Consulting Physician (Hematology and Oncology)  DIAGNOSIS: No diagnosis found.  SUMMARY OF ONCOLOGIC HISTORY: Oncology History  Malignant neoplasm of upper-outer quadrant of left breast in female, estrogen receptor positive ([place])  03/14/2022 Initial Diagnosis   Screening detected left breast masses 1.5 cm and 1.3 cm with enlarged left axillary lymph node, biopsy revealed grade 3 IDC with lymph node being positive, ER 95%, PR 0%, HER2 2+ by IHC and FISH positive with a ratio 2.45 and a copy #4.9, the second biopsy was HER2 negative with a ratio 2.15 and a copy #3.55   03/29/2022 Cancer Staging   Staging form: Breast, AJCC 8th Edition - Clinical: Stage IIA (cT1c, cN1, cM0, G3, ER+, PR-, HER2+) - Signed by Nicholas Lose, MD on 03/29/2022 Stage prefix: Initial diagnosis Histologic grading system: 3 grade system   03/29/2022 -  Anti-estrogen oral therapy   Letrozole daily   04/21/2022 -  Chemotherapy   Herceptin/Perjeta every 21 days initially x 3, will reassess with MRI and then will decide whether to continue or to add chemo.  (Original recommendation was TCHP x 6)      Genetic Testing   [facility] CancerNext-Expanded Panel was Negative. Report date is 05/30/2022.  The CancerNext-Expanded gene panel offered by [facility] and includes sequencing, rearrangement, and RNA analysis for the following 77 genes: AIP, ALK, APC, ATM, AXIN2, BAP1, BARD1, BLM, BMPR1A, BRCA1, BRCA2, BRIP1, CDC73, CDH1, CDK4, CDKN1B, CDKN2A, CHEK2, CTNNA1, DICER1, FANCC, FH, FLCN, GALNT12, KIF1B, LZTR1, MAX, MEN1, MET, MLH1, MSH2, MSH3, MSH6, MUTYH, NBN, NF1, NF2, NTHL1, PALB2, PHOX2B, PMS2, POT1, PRKAR1A, PTCH1, PTEN, RAD51C, RAD51D, RB1, RECQL, RET, SDHA, SDHAF2, SDHB, SDHC, SDHD, SMAD4, SMARCA4, SMARCB1, SMARCE1, STK11, SUFU,  TMEM127, TP53, TSC1, TSC2, VHL and XRCC2 (sequencing and deletion/duplication); EGFR, EGLN1, HOXB13, KIT, MITF, PDGFRA, POLD1, and POLE (sequencing only); EPCAM and GREM1 (deletion/duplication only).    04/21/2022 -  Chemotherapy   Patient is on Treatment Plan : BREAST Trastuzumab  + Pertuzumab q21d x 13 cycles       CHIEF COMPLIANT:  follow-up Herceptin and Perjeta    INTERVAL HISTORY: Caitlyn Torres is a 61 year old above-mentioned history of HER2 positive breast cancer was currently on Herceptin and Perjeta. She presents to the clinic for a follow-up.     ALLERGIES:  is allergic to aleve [naproxen sodium], aspirin, macrobid [nitrofurantoin], motrin [ibuprofen], and tylenol [acetaminophen].  MEDICATIONS:  Current Outpatient Medications  Medication Sig Dispense Refill   Brimonidine Tartrate (LUMIFY) 0.025 % SOLN Place 1 drop into both eyes daily as needed (red eyes).     carboxymethylcellulose (REFRESH PLUS) 0.5 % SOLN Place 1 drop into both eyes 3 (three) times daily as needed (dry eyes).     cetirizine (ZYRTEC) 10 MG tablet Take 10 mg by mouth daily.     EPINEPHrine 0.3 mg/0.3 mL IJ SOAJ injection Inject 0.3 mg into the muscle as needed for anaphylaxis. Prn (Patient not taking: Reported on 08/14/2022)     estradiol (ESTRACE VAGINAL) 0.1 MG/GM vaginal cream Place 1 Applicatorful vaginally once a week. 42.5 g 12   letrozole (FEMARA) 2.5 MG tablet Take 1 tablet (2.5 mg total) by mouth daily. 90 tablet 3   magic mouthwash w/lidocaine SOLN Take 5 mLs by mouth 4 (four) times daily as needed for mouth pain. (Patient not taking: Reported on  09/08/2022) 240 mL 1   Multiple Vitamins-Iron (CHLORELLA) CAPS Take 1 capsule by mouth daily.     ondansetron (ZOFRAN) 4 MG tablet Take 1 tablet (4 mg total) by mouth every 8 (eight) hours as needed for nausea or vomiting. 20 tablet 0   OVER THE COUNTER MEDICATION Take 1 capsule by mouth daily. Amla     OVER THE COUNTER MEDICATION Take 1 capsule by mouth  daily. Mushroom Complex     OVER THE COUNTER MEDICATION Place 1 Application vaginally daily as needed (moisture). Femininity     oxyCODONE (OXY IR/ROXICODONE) 5 MG immediate release tablet Take 1 tablet (5 mg total) by mouth every 6 (six) hours as needed for severe pain. 15 tablet 0   QUERCETIN PO Take 1 capsule by mouth daily.     sodium chloride (OCEAN) 0.65 % SOLN nasal spray Place 1 spray into both nostrils as needed for congestion.     traMADol (ULTRAM) 50 MG tablet Take 50 mg by mouth every 6 (six) hours as needed for moderate pain.     TURMERIC CURCUMIN PO Take 1 capsule by mouth daily.     No current facility-administered medications for this visit.    PHYSICAL EXAMINATION: ECOG PERFORMANCE STATUS: {CHL ONC ECOG [phone]}  There were no vitals filed for this visit. There were no vitals filed for this visit.  BREAST:*** No palpable masses or nodules in either right or left breasts. No palpable axillary supraclavicular or infraclavicular adenopathy no breast tenderness or nipple discharge. (exam performed in the presence of a chaperone)  LABORATORY DATA:  I have reviewed the data as listed    Latest Ref Rng & Units 09/13/2022    8:54 AM 08/24/2022    9:17 AM 07/13/2022   11:38 AM  CMP  Glucose 70 - 99 mg/dL 91  82  96   BUN 6 - 20 mg/dL _0 Creatinine 0.44 - 1.00 mg/dL 0.68  0.56  0.55   Sodium 135 - 145 mmol/L 137  136  137   Potassium 3.5 - 5.1 mmol/L 4.3  4.0  4.1   Chloride 98 - 111 mmol/L 97  104  104   CO2 22 - 32 mmol/L _1 Calcium 8.9 - 10.3 mg/dL 10.0  9.2  9.6   Total Protein 6.5 - 8.1 g/dL  6.7  6.7   Total Bilirubin 0.3 - 1.2 mg/dL  0.3  0.4   Alkaline Phos 38 - 126 U/L  66  57   AST 15 - 41 U/L  28  28   ALT 0 - 44 U/L  33  33     Lab Results  Component Value Date   WBC 3.7 (L) 09/13/2022   HGB 14.0 09/13/2022   HCT 41.2 09/13/2022   MCV 87.1 09/13/2022   PLT 155 09/13/2022   NEUTROABS 1.7 08/24/2022    ASSESSMENT & PLAN:  No  problem-specific Assessment & Plan notes found for this encounter.    No orders of the defined types were placed in this encounter.  The patient has a good understanding of the overall plan. she agrees with it. she will call with any problems that may develop before the next visit here. Total time spent: 30 mins including face to face time and time spent for planning, charting and co-ordination of care   Suzzette Righter, [place] 10/01/22    I Gardiner Coins am scribing for Dr. Lindi Adie  ***

## 2022-10-02 ENCOUNTER — Inpatient Hospital Stay (HOSPITAL_BASED_OUTPATIENT_CLINIC_OR_DEPARTMENT_OTHER): Payer: BC Managed Care – PPO | Admitting: Hematology and Oncology

## 2022-10-02 VITALS — BP 146/75 | HR 71 | Temp 97.7°F | Resp 18 | Ht 63.0 in | Wt 120.7 lb

## 2022-10-02 DIAGNOSIS — Z170 Estrogen receptor positive status [ER+]: Secondary | ICD-10-CM

## 2022-10-02 DIAGNOSIS — C50412 Malignant neoplasm of upper-outer quadrant of left female breast: Secondary | ICD-10-CM | POA: Diagnosis not present

## 2022-10-02 MED ORDER — PROCHLORPERAZINE MALEATE 10 MG PO TABS: 10.0000 mg | ORAL_TABLET | Freq: Four times a day (QID) | ORAL | 1 refills | Status: DC | PRN

## 2022-10-02 MED ORDER — ONDANSETRON HCL 8 MG PO TABS: 8.0000 mg | ORAL_TABLET | Freq: Three times a day (TID) | ORAL | 1 refills | Status: DC | PRN

## 2022-10-02 NOTE — Assessment & Plan Note (Signed)
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 Neoadjuvant chemotherapy with Herceptin Perjeta and letrozole for 8 cycles.  (Our original recommendation is TCHP x6 cycles but patient is not willing to go through chemo) 2. left lumpectomy and ALND 09/20/2022: 0.6 cm IDC 2/14 lymph nodes, margins negative, ER 95%, PR 0%, HER2 positive 3. Followed by adjuvant radiation therapy if patient had lumpectomy 4.  Adjuvant antiestrogen therapy with neratinib ------------------------------------------------------------------------------------------ Pathology counseling: I discussed the final pathology report of the patient provided  a copy of this report. I discussed the margins as well as lymph node surgeries. We also discussed the final staging along with previously performed ER/PR and HER-2/neu testing.  Treatment plan: Recommend Kadcyla maintenance therapy Adjuvant radiation Followed by adjuvant antiestrogen therapy  Kadcyla counseling: Discussed the pros and cons of cancer including the risk of neuropathy, fatigue, cytopenias  Return to clinic to start [facility]

## 2022-10-02 NOTE — Progress Notes (Signed)
DISCONTINUE ON PATHWAY REGIMEN - Breast     Cycle 1: A cycle is 21 days:     Pertuzumab      Trastuzumab-xxxx      Docetaxel      Carboplatin    Cycles 2 through 6: A cycle is every 21 days:     Pertuzumab      Trastuzumab-xxxx      Docetaxel      Carboplatin   **Always confirm dose/schedule in your pharmacy ordering system**  REASON: Other Reason PRIOR TREATMENT: [id number]: Docetaxel + Carboplatin + Trastuzumab IV + Pertuzumab IV (TCHP IV) q21 Days x 6 Cycles TREATMENT RESPONSE: Unable to Evaluate  START ON PATHWAY REGIMEN - Breast     A cycle is every 21 days:     Ado-trastuzumab emtansine   **Always confirm dose/schedule in your pharmacy ordering system**  Patient Characteristics: Post-Neoadjuvant Therapy and Resection, HER2 Positive, ER Positive, Residual Disease, Adjuvant Targeted Therapy After Neoadjuvant Chemo/Targeted Therapy Therapeutic Status: Post-Neoadjuvant Therapy and Resection Residual Invasive Disease Post-Neoadjuvant Therapy<= Yes ER Status: Positive (+) HER2 Status: Positive (+) PR Status: Negative (-) Intent of Therapy: Curative Intent, Discussed with Patient

## 2022-10-04 ENCOUNTER — Other Ambulatory Visit: Payer: Self-pay

## 2022-10-04 DIAGNOSIS — Z170 Estrogen receptor positive status [ER+]: Secondary | ICD-10-CM

## 2022-10-04 NOTE — Progress Notes (Signed)
Per md orders entered for [employer]. Requisition and all supported documents faxed to [phone]. Faxed confirmation was received.  

## 2022-10-07 NOTE — Progress Notes (Signed)
Pharmacist Chemotherapy Monitoring - Initial Assessment    Anticipated start date: 10/13/22   The following has been reviewed per standard work regarding the patient's treatment regimen: The patient's diagnosis, treatment plan and drug doses, and organ/hematologic function Lab orders and baseline tests specific to treatment regimen  The treatment plan start date, drug sequencing, and pre-medications Prior authorization status  Patient's documented medication list, including drug-drug interaction screen and prescriptions for anti-emetics and supportive care specific to the treatment regimen The drug concentrations, fluid compatibility, administration routes, and timing of the medications to be used The patient's access for treatment and lifetime cumulative dose history, if applicable  The patient's medication allergies and previous infusion related reactions, if applicable   Changes made to treatment plan:  N/A  Follow up needed:  Pending authorization for treatment    Wynona Neat, [facility], 10/07/2022  9:14 AM

## 2022-10-08 NOTE — Progress Notes (Signed)
Patient Care Team: Bradd Canary, MD as PCP - General (Family Medicine) Mauro Kaufmann, RN as Oncology Nurse Navigator Rockwell Germany, RN as Oncology Nurse Navigator Nicholas Lose, MD as Consulting Physician (Hematology and Oncology)  DIAGNOSIS:  Encounter Diagnosis  Name Primary?   Malignant neoplasm of upper-outer quadrant of left breast in female, estrogen receptor positive ([place]) Yes    SUMMARY OF ONCOLOGIC HISTORY: Oncology History  Malignant neoplasm of upper-outer quadrant of left breast in female, estrogen receptor positive ([place])  03/14/2022 Initial Diagnosis   Screening detected left breast masses 1.5 cm and 1.3 cm with enlarged left axillary lymph node, biopsy revealed grade 3 IDC with lymph node being positive, ER 95%, PR 0%, HER2 2+ by IHC and FISH positive with a ratio 2.45 and a copy #4.9, the second biopsy was HER2 negative with a ratio 2.15 and a copy #3.55   03/29/2022 Cancer Staging   Staging form: Breast, AJCC 8th Edition - Clinical: Stage IIA (cT1c, cN1, cM0, G3, ER+, PR-, HER2+) - Signed by Nicholas Lose, MD on 03/29/2022 Stage prefix: Initial diagnosis Histologic grading system: 3 grade system   03/29/2022 -  Anti-estrogen oral therapy   Letrozole daily   04/21/2022 -  Chemotherapy   Herceptin/Perjeta every 21 days initially x 3, will reassess with MRI and then will decide whether to continue or to add chemo.  (Original recommendation was TCHP x 6)      Genetic Testing   [facility] CancerNext-Expanded Panel was Negative. Report date is 05/30/2022.  The CancerNext-Expanded gene panel offered by [facility] and includes sequencing, rearrangement, and RNA analysis for the following 77 genes: AIP, ALK, APC, ATM, AXIN2, BAP1, BARD1, BLM, BMPR1A, BRCA1, BRCA2, BRIP1, CDC73, CDH1, CDK4, CDKN1B, CDKN2A, CHEK2, CTNNA1, DICER1, FANCC, FH, FLCN, GALNT12, KIF1B, LZTR1, MAX, MEN1, MET, MLH1, MSH2, MSH3, MSH6, MUTYH, NBN, NF1, NF2, NTHL1, PALB2, PHOX2B, PMS2, POT1, PRKAR1A,  PTCH1, PTEN, RAD51C, RAD51D, RB1, RECQL, RET, SDHA, SDHAF2, SDHB, SDHC, SDHD, SMAD4, SMARCA4, SMARCB1, SMARCE1, STK11, SUFU, TMEM127, TP53, TSC1, TSC2, VHL and XRCC2 (sequencing and deletion/duplication); EGFR, EGLN1, HOXB13, KIT, MITF, PDGFRA, POLD1, and POLE (sequencing only); EPCAM and GREM1 (deletion/duplication only).    04/21/2022 - 09/14/2022 Chemotherapy   Patient is on Treatment Plan : BREAST Trastuzumab  + Pertuzumab q21d x 13 cycles     10/13/2022 -  Chemotherapy   Patient is on Treatment Plan : BREAST ADO-Trastuzumab Emtansine (Kadcyla) q21d       CHIEF COMPLIANT: Kadcyla cycle 1    INTERVAL HISTORY: Caitlyn Torres is a 61 year old above-mentioned history of HER2 positive breast cancer was currently on Herceptin Prejeta. She presents to the clinic for a follow-up and cycle 1 Kadcyla.  She is healing very well from the recent surgery.   ALLERGIES:  is allergic to aleve [naproxen sodium], aspirin, macrobid [nitrofurantoin], motrin [ibuprofen], and tylenol [acetaminophen].  MEDICATIONS:  Current Outpatient Medications  Medication Sig Dispense Refill   ALPRAZolam (XANAX) 0.25 MG tablet TAKE ONE TABLET BY MOUTH EVERY DAY AS NEEDED FOR ANXIETY     Brimonidine Tartrate (LUMIFY) 0.025 % SOLN Place 1 drop into both eyes daily as needed (red eyes).     carboxymethylcellulose (REFRESH PLUS) 0.5 % SOLN Place 1 drop into both eyes 3 (three) times daily as needed (dry eyes).     cephALEXin (KEFLEX) 500 MG capsule TAKE 1 CAPSULE 3 TIMES A DAY BY MOUTH 5 DAYS.     cetirizine (ZYRTEC) 10 MG tablet Take 10 mg by mouth daily.  EPINEPHrine 0.3 mg/0.3 mL IJ SOAJ injection Inject 0.3 mg into the muscle as needed for anaphylaxis. Prn     Lidocaine-Prilocaine &Lido HCl 2.5-2.5 & 3.88 % KIT APPLY TO AFFECTED AREA ONCE AS DIRECTED     magic mouthwash w/lidocaine SOLN Take 5 mLs by mouth 4 (four) times daily as needed for mouth pain. 240 mL 1   Multiple Vitamins-Iron (CHLORELLA) CAPS Take 1 capsule  by mouth daily.     ondansetron (ZOFRAN) 8 MG tablet Take 1 tablet (8 mg total) by mouth every 8 (eight) hours as needed for nausea or vomiting. 30 tablet 1   OVER THE COUNTER MEDICATION Take 1 capsule by mouth daily. Amla     OVER THE COUNTER MEDICATION Take 1 capsule by mouth daily. Mushroom Complex     OVER THE COUNTER MEDICATION Place 1 Application vaginally daily as needed (moisture). Femininity     oxyCODONE (OXY IR/ROXICODONE) 5 MG immediate release tablet Take 1 tablet (5 mg total) by mouth every 6 (six) hours as needed for severe pain. 15 tablet 0   prochlorperazine (COMPAZINE) 10 MG tablet Take 1 tablet (10 mg total) by mouth every 6 (six) hours as needed for nausea or vomiting. 30 tablet 1   QUERCETIN PO Take 1 capsule by mouth daily.     sodium chloride (OCEAN) 0.65 % SOLN nasal spray Place 1 spray into both nostrils as needed for congestion.     traMADol (ULTRAM) 50 MG tablet Take 50 mg by mouth every 6 (six) hours as needed for moderate pain.     TURMERIC CURCUMIN PO Take 1 capsule by mouth daily.     No current facility-administered medications for this visit.    PHYSICAL EXAMINATION: ECOG PERFORMANCE STATUS: 1 - Symptomatic but completely ambulatory  Vitals:   10/13/22 0911  BP: (!) 141/65  Pulse: 68  Resp: 18  Temp: (!) 97.5 F (36.4 C)  SpO2: 100%   Filed Weights   10/13/22 0911  Weight: 116 lb 6.4 oz (52.8 kg)      LABORATORY DATA:  I have reviewed the data as listed    Latest Ref Rng & Units 09/13/2022    8:54 AM 08/24/2022    9:17 AM 07/13/2022   11:38 AM  CMP  Glucose 70 - 99 mg/dL 91  82  96   BUN 6 - 20 mg/dL _0 Creatinine 0.44 - 1.00 mg/dL 0.68  0.56  0.55   Sodium 135 - 145 mmol/L 137  136  137   Potassium 3.5 - 5.1 mmol/L 4.3  4.0  4.1   Chloride 98 - 111 mmol/L 97  104  104   CO2 22 - 32 mmol/L _1 Calcium 8.9 - 10.3 mg/dL 10.0  9.2  9.6   Total Protein 6.5 - 8.1 g/dL  6.7  6.7   Total Bilirubin 0.3 - 1.2 mg/dL  0.3  0.4    Alkaline Phos 38 - 126 U/L  66  57   AST 15 - 41 U/L  28  28   ALT 0 - 44 U/L  33  33     Lab Results  Component Value Date   WBC 3.3 (L) 10/13/2022   HGB 14.2 10/13/2022   HCT 41.4 10/13/2022   MCV 85.4 10/13/2022   PLT 162 10/13/2022   NEUTROABS 1.6 (L) 10/13/2022    ASSESSMENT & PLAN:  Malignant neoplasm of upper-outer quadrant of left breast in  female, estrogen receptor positive ([place]) 03/14/2022:Screening detected left breast masses 1.5 cm and 1.3 cm with enlarged left axillary lymph node, biopsy revealed grade 3 IDC with lymph node being positive, ER 95%, PR 0%, HER2 2+ by IHC and FISH positive with a ratio 2.45 and a copy #4.9, the second biopsy was HER2 negative with a ratio 2.15 and a copy #3.55   Treatment plan based on multidisciplinary tumor board: 1. Neoadjuvant chemotherapy with Herceptin Perjeta and letrozole for 8 cycles.  (Our original recommendation is TCHP x6 cycles but patient is not willing to go through chemo) 2. left lumpectomy and ALND 09/20/2022: 0.6 cm IDC 2/14 lymph nodes, margins negative, ER 95%, PR 0%, HER2 positive 3. Followed by adjuvant radiation therapy if patient had lumpectomy 4.  Adjuvant antiestrogen therapy with neratinib ------------------------------------------------------------------------------------------ Current treatment: Kadcyla maintenance cycle 1 Patient will be set up for adjuvant radiation. Discussed once again Kadcyla toxicities and consent was obtained.  Return to clinic in 3 weeks for cycle 2    No orders of the defined types were placed in this encounter.  The patient has a good understanding of the overall plan. she agrees with it. she will call with any problems that may develop before the next visit here. Total time spent: 30 mins including face to face time and time spent for planning, charting and co-ordination of care   Harriette Ohara, MD 10/13/22    I Gardiner Coins am scribing for Dr. Lindi Adie  I have  reviewed the above documentation for accuracy and completeness, and I agree with the above.

## 2022-10-09 ENCOUNTER — Telehealth: Payer: Self-pay | Admitting: Hematology and Oncology

## 2022-10-09 NOTE — Telephone Encounter (Signed)
Scheduled appointment per WQ. Patient is aware. 

## 2022-10-11 ENCOUNTER — Encounter: Payer: Self-pay | Admitting: Hematology and Oncology

## 2022-10-11 ENCOUNTER — Encounter: Payer: Self-pay | Admitting: Adult Health

## 2022-10-12 ENCOUNTER — Encounter (HOSPITAL_COMMUNITY): Payer: Self-pay

## 2022-10-13 ENCOUNTER — Inpatient Hospital Stay (HOSPITAL_BASED_OUTPATIENT_CLINIC_OR_DEPARTMENT_OTHER): Payer: BC Managed Care – PPO | Admitting: Hematology and Oncology

## 2022-10-13 ENCOUNTER — Inpatient Hospital Stay: Payer: BC Managed Care – PPO

## 2022-10-13 ENCOUNTER — Inpatient Hospital Stay: Payer: BC Managed Care – PPO | Attending: Hematology and Oncology

## 2022-10-13 ENCOUNTER — Encounter: Payer: Self-pay | Admitting: Hematology and Oncology

## 2022-10-13 VITALS — BP 141/65 | HR 68 | Temp 97.5°F | Resp 18 | Ht 63.0 in | Wt 116.4 lb

## 2022-10-13 VITALS — BP 119/63 | HR 64 | Temp 97.5°F | Resp 18

## 2022-10-13 DIAGNOSIS — C50412 Malignant neoplasm of upper-outer quadrant of left female breast: Secondary | ICD-10-CM

## 2022-10-13 DIAGNOSIS — Z170 Estrogen receptor positive status [ER+]: Secondary | ICD-10-CM | POA: Diagnosis not present

## 2022-10-13 DIAGNOSIS — R5383 Other fatigue: Secondary | ICD-10-CM | POA: Insufficient documentation

## 2022-10-13 DIAGNOSIS — Z5112 Encounter for antineoplastic immunotherapy: Secondary | ICD-10-CM | POA: Insufficient documentation

## 2022-10-13 DIAGNOSIS — D72819 Decreased white blood cell count, unspecified: Secondary | ICD-10-CM | POA: Insufficient documentation

## 2022-10-13 DIAGNOSIS — Z79811 Long term (current) use of aromatase inhibitors: Secondary | ICD-10-CM | POA: Insufficient documentation

## 2022-10-13 DIAGNOSIS — Z923 Personal history of irradiation: Secondary | ICD-10-CM | POA: Diagnosis not present

## 2022-10-13 DIAGNOSIS — Z95828 Presence of other vascular implants and grafts: Secondary | ICD-10-CM

## 2022-10-13 LAB — CBC WITH DIFFERENTIAL (CANCER CENTER ONLY)
Abs Immature Granulocytes: 0.01 10*3/uL (ref 0.00–0.07)
Basophils Absolute: 0 10*3/uL (ref 0.0–0.1)
Basophils Relative: 1 %
Eosinophils Absolute: 0.2 10*3/uL (ref 0.0–0.5)
Eosinophils Relative: 7 %
HCT: 41.4 % (ref 36.0–46.0)
Hemoglobin: 14.2 g/dL (ref 12.0–15.0)
Immature Granulocytes: 0 %
Lymphocytes Relative: 36 %
Lymphs Abs: 1.2 10*3/uL (ref 0.7–4.0)
MCH: 29.3 pg (ref 26.0–34.0)
MCHC: 34.3 g/dL (ref 30.0–36.0)
MCV: 85.4 fL (ref 80.0–100.0)
Monocytes Absolute: 0.3 10*3/uL (ref 0.1–1.0)
Monocytes Relative: 10 %
Neutro Abs: 1.6 10*3/uL — ABNORMAL LOW (ref 1.7–7.7)
Neutrophils Relative %: 46 %
Platelet Count: 162 10*3/uL (ref 150–400)
RBC: 4.85 MIL/uL (ref 3.87–5.11)
RDW: 14.3 % (ref 11.5–15.5)
WBC Count: 3.3 10*3/uL — ABNORMAL LOW (ref 4.0–10.5)
nRBC: 0 % (ref 0.0–0.2)

## 2022-10-13 LAB — CMP (CANCER CENTER ONLY)
ALT: 33 U/L (ref 0–44)
AST: 28 U/L (ref 15–41)
Albumin: 4.6 g/dL (ref 3.5–5.0)
Alkaline Phosphatase: 61 U/L (ref 38–126)
Anion gap: 7 (ref 5–15)
BUN: 14 mg/dL (ref 6–20)
CO2: 27 mmol/L (ref 22–32)
Calcium: 10.2 mg/dL (ref 8.9–10.3)
Chloride: 105 mmol/L (ref 98–111)
Creatinine: 0.62 mg/dL (ref 0.44–1.00)
GFR, Estimated: >60 mL/min (ref >60)
Glucose, Bld: 110 mg/dL — ABNORMAL HIGH (ref 70–99)
Potassium: 4 mmol/L (ref 3.5–5.1)
Sodium: 139 mmol/L (ref 135–145)
Total Bilirubin: 0.4 mg/dL (ref 0.3–1.2)
Total Protein: 7.2 g/dL (ref 6.5–8.1)

## 2022-10-13 MED ORDER — SODIUM CHLORIDE 0.9 % IV SOLN
3.6000 mg/kg | Freq: Once | INTRAVENOUS | Status: AC
  Administered 2022-10-13: 200 mg via INTRAVENOUS
  Filled 2022-10-13: qty 10

## 2022-10-13 MED ORDER — SODIUM CHLORIDE 0.9% FLUSH
10.0000 mL | Freq: Once | INTRAVENOUS | Status: AC
  Administered 2022-10-13: 10 mL

## 2022-10-13 MED ORDER — PROCHLORPERAZINE MALEATE 10 MG PO TABS
10.0000 mg | ORAL_TABLET | Freq: Once | ORAL | Status: AC
  Administered 2022-10-13: 10 mg via ORAL
  Filled 2022-10-13: qty 1

## 2022-10-13 MED ORDER — DIPHENHYDRAMINE HCL 25 MG PO CAPS
25.0000 mg | ORAL_CAPSULE | Freq: Once | ORAL | Status: AC
  Administered 2022-10-13: 25 mg via ORAL
  Filled 2022-10-13: qty 1

## 2022-10-13 MED ORDER — SODIUM CHLORIDE 0.9 % IV SOLN: Freq: Once | INTRAVENOUS | Status: AC

## 2022-10-13 MED ORDER — SODIUM CHLORIDE 0.9% FLUSH
10.0000 mL | INTRAVENOUS | Status: DC | PRN
  Administered 2022-10-13: 10 mL

## 2022-10-13 MED ORDER — HEPARIN SOD (PORK) LOCK FLUSH 100 UNIT/ML IV SOLN
500.0000 [IU] | Freq: Once | INTRAVENOUS | Status: AC | PRN
  Administered 2022-10-13: 500 [IU]

## 2022-10-13 NOTE — Progress Notes (Signed)
Patient only wanted to stay 30 minutes of the 90 minute post infusion observation. VSS, no signs of distress noted. Patient aware to call with any concerns.

## 2022-10-13 NOTE — Assessment & Plan Note (Signed)
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 Neoadjuvant chemotherapy with Herceptin Perjeta and letrozole for 8 cycles.  (Our original recommendation is TCHP x6 cycles but patient is not willing to go through chemo) 2. left lumpectomy and ALND 09/20/2022: 0.6 cm IDC 2/14 lymph nodes, margins negative, ER 95%, PR 0%, HER2 positive 3. Followed by adjuvant radiation therapy if patient had lumpectomy 4.  Adjuvant antiestrogen therapy with neratinib ------------------------------------------------------------------------------------------ Current treatment: Kadcyla maintenance cycle 1 Patient will be set up for adjuvant radiation. Discussed once again Kadcyla toxicities and consent was obtained.  Return to clinic in 3 weeks for cycle 2

## 2022-10-13 NOTE — Patient Instructions (Signed)
[facility] ONCOLOGY  Discharge Instructions: Thank you for choosing [facility] to provide your oncology and hematology care.   If you have a lab appointment with the [facility], please go directly to the [facility] and check in at the registration area.   Wear comfortable clothing and clothing appropriate for easy access to any Portacath or PICC line.   We strive to give you quality time with your provider. You may need to reschedule your appointment if you arrive late (15 or more minutes).  Arriving late affects you and other patients whose appointments are after yours.  Also, if you miss three or more appointments without notifying the office, you may be dismissed from the clinic at the provider's discretion.      For prescription refill requests, have your pharmacy contact our office and allow 72 hours for refills to be completed.    Today you received the following chemotherapy and/or immunotherapy agents; Ado-Trastuzumab Emtansine (Kadcyla)       To help prevent nausea and vomiting after your treatment, we encourage you to take your nausea medication as directed.  BELOW ARE SYMPTOMS THAT SHOULD BE REPORTED IMMEDIATELY: *FEVER GREATER THAN 100.4 F (38 C) OR HIGHER *CHILLS OR SWEATING *NAUSEA AND VOMITING THAT IS NOT CONTROLLED WITH YOUR NAUSEA MEDICATION *UNUSUAL SHORTNESS OF BREATH *UNUSUAL BRUISING OR BLEEDING *URINARY PROBLEMS (pain or burning when urinating, or frequent urination) *BOWEL PROBLEMS (unusual diarrhea, constipation, pain near the anus) TENDERNESS IN MOUTH AND THROAT WITH OR WITHOUT PRESENCE OF ULCERS (sore throat, sores in mouth, or a toothache) UNUSUAL RASH, SWELLING OR PAIN  UNUSUAL VAGINAL DISCHARGE OR ITCHING   Items with * indicate a potential emergency and should be followed up as soon as possible or go to the Emergency Department if any problems should occur.  Please show the CHEMOTHERAPY ALERT CARD or IMMUNOTHERAPY  ALERT CARD at check-in to the Emergency Department and triage nurse.  Should you have questions after your visit or need to cancel or reschedule your appointment, please contact [facility]  Dept: [phone]  and follow the prompts.  Office hours are 8:00 a.m. to 4:30 p.m. Monday - Friday. Please note that voicemails left after 4:00 p.m. may not be returned until the following business day.  We are closed weekends and major holidays. You have access to a nurse at all times for urgent questions. Please call the main number to the clinic Dept: [phone] and follow the prompts.   For any non-urgent questions, you may also contact your provider using MyChart. We now offer e-Visits for anyone 86 and older to request care online for non-urgent symptoms. For details visit [email or web address].   Also download the MyChart app! Go to the app store, search "MyChart", open the app, select [facility], and log in with your MyChart username and password.  Masks are optional in the cancer centers. If you would like for your care team to wear a mask while they are taking care of you, please let them know. You may have one support Whitnee Orzel who is at least 59 years old accompany you for your appointments.

## 2022-10-18 NOTE — Therapy (Signed)
OUTPATIENT PHYSICAL THERAPY BREAST CANCER POST OP FOLLOW UP   Patient Name: Caitlyn Torres MRN: [id number] DOB:1960-12-19, 61 y.o., female Today's Date: 10/19/2022  END OF SESSION:  PT End of Session - 10/19/22 1611     Visit Number 1    Number of Visits 12    Date for PT Re-Evaluation 11/30/22    PT Start Time 1612   late   PT Stop Time 8295    PT Time Calculation (min) 43 min    Activity Tolerance Patient tolerated treatment well    Behavior During Therapy [facility] for tasks assessed/performed             Past Medical History:  Diagnosis Date   Anxiety    Arthritis    Cancer ([place]) 03/2022   L breast   Depression    Family history of breast cancer 05/15/2022   Family history of pancreatic cancer 05/15/2022   Family history of prostate cancer 05/15/2022   GERD (gastroesophageal reflux disease)    History of hiatal hernia    patient believes she may have been told she has this years ago   Past Surgical History:  Procedure Laterality Date   BREAST BIOPSY  09/19/2022   MM LT RADIOACTIVE SEED EA ADD LESION LOC MAMMO GUIDE 09/19/2022 [facility] MAMMOGRAPHY   BREAST BIOPSY  09/19/2022   MM LT RADIOACTIVE SEED LOC MAMMO GUIDE 09/19/2022 [facility] MAMMOGRAPHY   BREAST LUMPECTOMY WITH RADIOACTIVE SEED AND AXILLARY LYMPH NODE DISSECTION Left 09/20/2022   Procedure: LEFT BREAST SEED LUMPECTOMY, LEFT AXILLARY LYMPH NODE DISSECTION;  Surgeon: Erroll Luna, MD;  Location: [facility];  Service: General;  Laterality: Left;  GEN & PEC BLOCK   CERVIX SURGERY     Laser removal of pre-cancer cells   LASIK Bilateral    PORTACATH PLACEMENT Right 04/20/2022   Procedure: PORT PLACEMENT;  Surgeon: Erroll Luna, MD;  Location: [facility];  Service: General;  Laterality: Right;   Patient Active Problem List   Diagnosis Date Noted   Other fatigue 08/28/2022   Acute pain 08/28/2022   Genetic testing 06/01/2022   Family history of breast cancer 05/15/2022   Family history of pancreatic cancer 05/15/2022    Family history of prostate cancer 05/15/2022   Port-A-Cath in place 04/28/2022   Malignant neoplasm of upper-outer quadrant of left breast in female, estrogen receptor positive ([place]) 03/29/2022    PCP: Sidney Ace, MD  REFERRING PROVIDER: Nicholas Lose, MD  REFERRING DIAG: Left Breast Cancer  THERAPY DIAG:  Malignant neoplasm of upper-outer quadrant of left female breast, unspecified estrogen receptor status ([place])  Abnormal posture  Rationale for Evaluation and Treatment: Rehabilitation  ONSET DATE: 03/14/2022  SUBJECTIVE:  SUBJECTIVE STATEMENT: In the last 2 days I have had a little better ROM. I have  not been doing any special exercises I was given because I have misplaced them. She feels  like the back of her arm is swollen and numb. She feels a pulling at the elbow and medial upper arm with ROM  PERTINENT HISTORY:  03/14/2022:Screening detected left breast masses 1.5 cm and 1.3 cm with enlarged left axillary lymph node, biopsy revealed grade 3 IDC with lymph node being positive, ER 95%, PR 0%, HER2 2+ by IHC and FISH positive with a ratio 2.45 and a copy #4.9, the second biopsy was HER2 negative with a ratio  1. Neoadjuvant chemotherapy with Herceptin Perjeta and letrozole for 8 cycles.  (Our original recommendation is TCHP x6 cycles but patient is not willing to go through chemo) 2. left lumpectomy and ALND 09/20/2022: 0.6 cm IDC 2/14 lymph nodes, margins negative, ER 95%, PR 0%, HER2 positive 3. Followed by adjuvant radiation therapy  4.  Adjuvant antiestrogen therapy with neratinib She had left lumpectomy on 09/20/2022 with 2/14 LN's  PATIENT GOALS:  Reassess how my recovery is going related to arm function, pain, and swelling.  PAIN:  Are you having pain? Yes: NPRS scale: 0-4/10 Pain  location: left axillary region Aggravating factors: reaching to extremes, lifting, sleeping on left, Relieving factors: not reaching to extremes  PRECAUTIONS: Recent Surgery, left UE Lymphedema risk,   ACTIVITY LEVEL / LEISURE: not yet,   OBJECTIVE:   PATIENT SURVEYS:  QUICK DASH: 34.09  OBSERVATIONS: Incisions healed with glue still present both incisions. Mild firmness under breast incision and foam pad made to place over it in bra. Several cords visible and palpable in axillary region and running to medial elbow.  POSTURE:  Forward head rounded shoulders  LYMPHEDEMA ASSESSMENT:   UPPER EXTREMITY AROM/PROM:   A/PROM RIGHT   eval    Shoulder extension 47  Shoulder flexion 163  Shoulder abduction 180  Shoulder internal rotation 67  Shoulder external rotation 107                          (Blank rows = not tested)   A/PROM LEFT   eval LEFT 10/19/2022  Shoulder extension 60 40  Shoulder flexion 165 142  Shoulder abduction 180 74  Shoulder internal rotation 57   Shoulder external rotation 107                           (Blank rows = not tested)     CERVICAL AROM: All within normal limits:          UPPER EXTREMITY STRENGTH: WNL right, Left NT due to sx     LYMPHEDEMA ASSESSMENTS:    LANDMARK RIGHT   Eval 03/31/2022 RIGHT 10/19/2022  10 cm proximal to olecranon process 24.0 22.7  Olecranon process 22.3 21.6  10 cm proximal to ulnar styloid process 20.9 20.4  Just proximal to ulnar styloid process 14.6 14.35  Across hand at thumb web space 19.3 19.0  At base of 2nd digit 5.9 5.6  (Blank rows = not tested)   [facility] LEFT   Eval 03/31/2022 Left 10/19/2022  10 cm proximal to olecranon process 23.6 22.8  Olecranon process 21.6 21.6  10 cm proximal to ulnar styloid process 19.5 18.9  Just proximal to ulnar styloid process 14.15 14.5  Across hand at thumb web space 18.45 18.0  At base  of 2nd digit 5.7 5.4  (Blank rows = not tested)      Surgery  type/Date: Left lumpectomy with SLNB Number of lymph nodes removed: 2/14 Current/past treatment (chemo, radiation, hormone therapy): Herceptin, radiation pending, anti estrogens Other symptoms:  Heaviness/tightness Yes Pain Yes Pitting edema No Infections No Decreased scar mobility No Stemmer sign No  PATIENT EDUCATION:  Education details: supine flexion and stargazer, standing wall slide, sitting or standing scapular retraction. Person educated: Patient Education method: Explanation, Demonstration, and Handouts Education comprehension: verbalized understanding and returned demonstration  HOME EXERCISE PROGRAM: Reviewed previously given post op HEP. Practiced each x 5-7 reps and gave pt new handout secondary to misplacing hers.  ASSESSMENT:  CLINICAL IMPRESSION: Pt is s/p Neoadjuvant chemotherapy with Herceptin Perjeta and letrozole for 8 cycles. She had a left Lumpectomy with 2/14 LN's on 09/20/2022. She had a drain and just recently started to use her left arm a little.  ROM was limited though out left shoulder.She misplaced her exercises and all exercises were performed in clinic today with pt demonstrating understanding. Her incisions are healing well with glue still present. She is noted to have axillary cording running to the medial elbow. There is very mild lateral breast swelling and some firmness at the breast incision.Pt. will try to wear her compression bra with foam pad that was made for her today over area of firmness.. There are no signs of UE lymphedema. Pt will benefit from skilled therapy to decrease muscular tension,progress ROM, decrease cording and progress strength. She should also be fit for a compression sleeve due to the number of lymph nodes she has had removed.  Pt will benefit from skilled therapeutic intervention to improve on the following deficits: Decreased knowledge of precautions, impaired UE functional use, pain, decreased ROM, postural dysfunction.   PT  treatment/interventions: ADL/Self care home management, Therapeutic exercises, Therapeutic activity, Neuromuscular re-education, Patient/Family education, Self Care, Orthotic/Fit training, scar mobilization, Manual therapy, and Re-evaluation   GOALS: Goals reviewed with patient? Yes  LONG TERM GOALS:  (STG=LTG)  GOALS Name Target Date  Goal status  1 Pt will demonstrate she has regained full shoulder ROM and function post operatively compared to baselines.  Baseline: 11/30/2022 IN PROGRESS  2 Pt will have quick dash no greater than 15% to demonstrate improved function 11/30/2022 INITIAL  3 Pt will have improved pain by 50% or greater 11/30/2022 INITIAL  4 Pt will be fit for a compression sleeve/gauntlet and will be independent in donning and doffing 11/30/2022 INITIAL     PLAN:  PT FREQUENCY/DURATION: 2X/week x 6 weeks  PLAN FOR NEXT SESSION: Print follow up info,STM left UQ, AAROM, PROM, MFR to cording, scar mobs Give script and info to [facility], update HEP prn, sign up for ABC in Jan.   [facility]  [address]  [place] [address]  [phone]  After Breast Cancer Class It is recommended you attend the ABC class to be educated on lymphedema risk reduction. This class is free of charge and lasts for 1 hour. It is a 1-time class. You will need to download the Webex app either on your phone or computer. We will send you a link the night before or the morning of the class. You should be able to click on that link to join the class. This is not a confidential class. You don't have to turn your camera on, but other participants may be able to see  your email address.  Scar massage You can begin gentle scar massage to you incision sites. Gently place one hand on the incision and move the skin (without sliding on the skin) in various directions. Do this for a few minutes and then you can gently massage either coconut oil or  vitamin E cream into the scars.  Compression garment You should continue wearing your compression bra until you feel like you no longer have swelling.  Home exercise Program Continue doing the exercises you were given until you feel like you can do them without feeling any tightness at the end.   Walking Program Studies show that 30 minutes of walking per day (fast enough to elevate your heart rate) can significantly reduce the risk of a cancer recurrence. If you can't walk due to other medical reasons, we encourage you to find another activity you could do (like a stationary bike or water exercise).  Posture After breast cancer surgery, people frequently sit with rounded shoulders posture because it puts their incisions on slack and feels better. If you sit like this and scar tissue forms in that position, you can become very tight and have pain sitting or standing with good posture. Try to be aware of your posture and sit and stand up tall to heal properly.  Follow up PT: It is recommended you return every 3 months for the first 3 years following surgery to be assessed on the SOZO machine for an L-Dex score. This helps prevent clinically significant lymphedema in 95% of patients. These follow up screens are 10 minute appointments that you are not billed for.  Claris Pong, PT 10/19/2022, 5:24 PM

## 2022-10-19 ENCOUNTER — Ambulatory Visit: Payer: BC Managed Care – PPO | Attending: Surgery

## 2022-10-19 DIAGNOSIS — Z170 Estrogen receptor positive status [ER+]: Secondary | ICD-10-CM | POA: Diagnosis not present

## 2022-10-19 DIAGNOSIS — M25612 Stiffness of left shoulder, not elsewhere classified: Secondary | ICD-10-CM

## 2022-10-19 DIAGNOSIS — Z4889 Encounter for other specified surgical aftercare: Secondary | ICD-10-CM | POA: Diagnosis not present

## 2022-10-19 DIAGNOSIS — R293 Abnormal posture: Secondary | ICD-10-CM | POA: Insufficient documentation

## 2022-10-19 DIAGNOSIS — C50412 Malignant neoplasm of upper-outer quadrant of left female breast: Secondary | ICD-10-CM | POA: Diagnosis present

## 2022-10-19 NOTE — Patient Instructions (Signed)

## 2022-10-23 NOTE — Progress Notes (Signed)
Radiation Oncology         [phone] ________________________________  Name: Caitlyn Torres        MRN: [id number]  Date of Service: 10/25/2022 DOB: 25-Dec-1960  [id number], Marilynne Drivers, MD  Nicholas Lose, MD     REFERRING PHYSICIAN: Nicholas Lose, MD   DIAGNOSIS: The encounter diagnosis was Malignant neoplasm of upper-outer quadrant of left breast in female, estrogen receptor positive ([place]).   HISTORY OF PRESENT ILLNESS: Caitlyn Torres is a 61 y.o. female with a diagnosis of  node positive left breast cancer which was diagnosed in May 2023. Her biopsies showed grade 3 invasive ductal carcinoma the sampled lymph node confirmed metastatic carcinoma.  Her tumor in the breast was ER positive weak to moderate staining, PR negative, HER2 positive with a Ki-67 of 95%.  Her node was ER positive weak to moderate staining PR negative, HER2 negative.  She underwent an MRI on 04/07/2022 of the breasts, this confirmed the 2 biopsy-proven malignancies with no additional suspicious findings in the left breast, and normal-appearing right breast.  No additional lymphadenopathy was identified but biopsy-proven confirmation in the lymph node measuring 2 cm with clip artifact present.  She is scheduled to undergo staging studies later this week.  She met with Dr. Lindi Adie who recommended neoadjuvant chemotherapy.  She had been tolerating letrozole and elected to forego chemotherapy but to start with Herceptin and Perjeta along with letrozole.  After completing approximately 3 cycles, she returned for diagnostic ultrasound on 06/20/2022, this showed a reduction in her tumor from 1.5 cm to 1.2 cm in the 2 o'clock site as well as improvement in the 2:30 position site from 1.3 cm to 0.8 cm.  Her axillary assessment showed increasing change in the cortical thickness of the left axillary lymph node previously 6 mm and then 1.1 cm.  This prompted a change in her HER2 therapy with a switch to [place].  Repeat ultrasound on  08/22/2022 showed stable changes in the 2:30 position, slight improvement in the 2 o'clock position with the largest area measuring 8 mm and now 2 abnormal lymph nodes in the left axilla.  An MRI on 09/17/2022 showed smaller changes in the left breast in comparison to the initial study and the known site of metastatic disease in her left axilla also being smaller from the initial study a second lymph node was not visualized.  She underwent a left lumpectomy with axillary lymph node dissection on 09/20/2022.  Final pathology showed a grade 3 invasive ductal carcinoma measuring 6 mm with associated granulation and inflammation.  Her margins were clear.  2 out of 14 axillary nodes were involved but were negative for extracapsular extension.  The largest area of disease measured 2 cm.  She was started on adjuvant Kadcyla on 10/13/2022.  She is seen to discuss adjuvant radiotherapy to the breast and regional nodes.   PREVIOUS RADIATION THERAPY: No   PAST MEDICAL HISTORY:  Past Medical History:  Diagnosis Date   Anxiety    Arthritis    Cancer ([place]) 03/2022   L breast   Depression    Family history of breast cancer 05/15/2022   Family history of pancreatic cancer 05/15/2022   Family history of prostate cancer 05/15/2022   GERD (gastroesophageal reflux disease)    History of hiatal hernia    patient believes she may have been told she has this years ago       PAST SURGICAL HISTORY: Past Surgical History:  Procedure Laterality Date  BREAST BIOPSY  09/19/2022   MM LT RADIOACTIVE SEED EA ADD LESION LOC MAMMO GUIDE 09/19/2022 [facility] MAMMOGRAP[facility]   BREAST BIOPSY  09/19/2022   MM LT RADIOACTIVE SEED LOC MAMMO GUIDE 09/19/2022 [facility] MAMMOGRAP[facility]   BREAST LUMPECTOMY WITH RADIOACTIVE SEED AND AXILLARY LYMPH NODE DISSECTION Left 09/20/2022   Procedure: LEFT BREAST SEED LUMPECTOMY, LEFT AXILLARY LYMPH NODE DISSECTION;  Surgeon: Erroll Luna, MD;  Location: [place];  Service: General;  Laterality: Left;  GEN &  PEC BLOCK   CERVIX SURGERY     Laser removal of pre-cancer cells   LASIK Bilateral    PORTACATH PLACEMENT Right 04/20/2022   Procedure: PORT PLACEMENT;  Surgeon: Erroll Luna, MD;  Location: [place];  Service: General;  Laterality: Right;     FAMILY HISTORY:  Family History  Problem Relation Age of Onset   Prostate cancer Father        dx after 43   Breast cancer Sister 40       neg GT   Prostate cancer Maternal Grandfather        d. late 26s   Pancreatic cancer Paternal Grandmother        d. 58s   Parkinson's disease Paternal Grandmother    Bladder Cancer Paternal Grandfather        dx after 28   Breast cancer Cousin        paternal female cousin; dx 34s     SOCIAL HISTORY:  reports that she has quit smoking. Her smoking use included cigarettes. She has a 20.00 pack-year smoking history. She has never used smokeless tobacco. She reports that she does not currently use alcohol. She reports that she does not use drugs. The patient is in a relationship and lives in [place]. 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     ALLERGIES: Aleve [naproxen sodium], Aspirin, Macrobid [nitrofurantoin], Motrin [ibuprofen], and Tylenol [acetaminophen]   MEDICATIONS:  Current Outpatient Medications  Medication Sig Dispense Refill   ALPRAZolam (XANAX) 0.25 MG tablet TAKE ONE TABLET BY MOUTH EVERY DAY AS NEEDED FOR ANXIETY     Brimonidine Tartrate (LUMIFY) 0.025 % SOLN Place 1 drop into both eyes daily as needed (red eyes).     carboxymethylcellulose (REFRESH PLUS) 0.5 % SOLN Place 1 drop into both eyes 3 (three) times daily as needed (dry eyes).     cephALEXin (KEFLEX) 500 MG capsule TAKE 1 CAPSULE 3 TIMES A DAY BY MOUTH 5 DAYS.     cetirizine (ZYRTEC) 10 MG tablet Take 10 mg by mouth daily.     EPINEPHrine 0.3 mg/0.3 mL IJ SOAJ injection Inject 0.3 mg into the muscle as needed for anaphylaxis. Prn     Lidocaine-Prilocaine  &Lido HCl 2.5-2.5 & 3.88 % KIT APPLY TO AFFECTED AREA ONCE AS DIRECTED     magic mouthwash w/lidocaine SOLN Take 5 mLs by mouth 4 (four) times daily as needed for mouth pain. 240 mL 1   Multiple Vitamins-Iron (CHLORELLA) CAPS Take 1 capsule by mouth daily.     ondansetron (ZOFRAN) 8 MG tablet Take 1 tablet (8 mg total) by mouth every 8 (eight) hours as needed for nausea or vomiting. 30 tablet 1   OVER THE COUNTER MEDICATION Take 1 capsule by mouth daily. Amla     OVER THE COUNTER MEDICATION Take 1 capsule by mouth daily. Mushroom Complex     OVER THE COUNTER MEDICATION Place 1 Application vaginally daily as needed (  moisture). Femininity     oxyCODONE (OXY IR/ROXICODONE) 5 MG immediate release tablet Take 1 tablet (5 mg total) by mouth every 6 (six) hours as needed for severe pain. 15 tablet 0   prochlorperazine (COMPAZINE) 10 MG tablet Take 1 tablet (10 mg total) by mouth every 6 (six) hours as needed for nausea or vomiting. 30 tablet 1   QUERCETIN PO Take 1 capsule by mouth daily.     sodium chloride (OCEAN) 0.65 % SOLN nasal spray Place 1 spray into both nostrils as needed for congestion.     traMADol (ULTRAM) 50 MG tablet Take 50 mg by mouth every 6 (six) hours as needed for moderate pain.     TURMERIC CURCUMIN PO Take 1 capsule by mouth daily.     No current facility-administered medications for this visit.     REVIEW OF SYSTEMS: On review of systems, the patient reports that she is doing pretty well. She feels like she has tolerated treatment well. She's working with PT to manage cording and some mild limits in her range of motion. She has some firmness over her surgical site. No other complaints are verbalized.      PHYSICAL EXAM:  Wt Readings from Last 3 Encounters:  10/13/22 116 lb 6.4 oz (52.8 kg)  10/02/22 120 lb 11.2 oz (54.7 kg)  09/20/22 118 lb (53.5 kg)   Temp Readings from Last 3 Encounters:  10/13/22 (!) 97.5 F (36.4 C) (Oral)  10/13/22 (!) 97.5 F (36.4 C) (Temporal)   10/02/22 97.7 F (36.5 C) (Temporal)   BP Readings from Last 3 Encounters:  10/13/22 119/63  10/13/22 (!) 141/65  10/02/22 (!) 146/75   Pulse Readings from Last 3 Encounters:  10/13/22 64  10/13/22 68  10/02/22 71    In general this is a well appearing caucasian female in no acute distress. She's alert and oriented x4 and appropriate throughout the examination. Cardiopulmonary assessment is negative for acute distress and she exhibits normal effort.  Her left lumpectomy and axillary incision sites are well-healed without erythema separation or drainage. She has mild induration deep to her breast incision without fluctuance.    ECOG = 1  0 - Asymptomatic (Fully active, able to carry on all predisease activities without restriction)  1 - Symptomatic but completely ambulatory (Restricted in physically strenuous activity but ambulatory and able to carry out work of a light or sedentary nature. For example, light housework, office work)  2 - Symptomatic, <50% in bed during the day (Ambulatory and capable of all self care but unable to carry out any work activities. Up and about more than 50% of waking hours)  3 - Symptomatic, >50% in bed, but not bedbound (Capable of only limited self-care, confined to bed or chair 50% or more of waking hours)  4 - Bedbound (Completely disabled. Cannot carry on any self-care. Totally confined to bed or chair)  5 - Death   Eustace Pen MM, Creech RH, Tormey DC, et al. [phone]). "Toxicity and response criteria of the [facility]". [place]elius Oncol. 5 (6): 649-55    LABORATORY DATA:  Lab Results  Component Value Date   WBC 3.3 (L) 10/13/2022   HGB 14.2 10/13/2022   HCT 41.4 10/13/2022   MCV 85.4 10/13/2022   PLT 162 10/13/2022   Lab Results  Component Value Date   NA 139 10/13/2022   K 4.0 10/13/2022   CL 105 10/13/2022   CO2 27 10/13/2022   Lab Results  Component  Value Date   ALT 33 10/13/2022   AST 28 10/13/2022    ALKPHOS 61 10/13/2022   BILITOT 0.4 10/13/2022      RADIOGRAPHY: No results found.     IMPRESSION/PLAN: 1. Stage IIA, [id number] grade 3, ER positive, HER2 amplfied invasive ductal carcinoma of the left breast with progressive nodal disease with a modified neoadjuvant regimen. Dr. Lisbeth Renshaw has reviewed her final results of surgery and her course to date.  She has been tolerating Kadcyla, and we reviewed the  rationale for external radiotherapy to the breast and regional nodes to reduce risks of local recurrence with continuation of antiestrogen and anti-HER2 therapy. We discussed the risks, benefits, short, and long term effects of radiotherapy, as well as the curative intent, and the patient is interested in proceeding at the appropriate time.  I reviewed the delivery and logistics of radiotherapy and Dr. Lisbeth Renshaw recommends 6 1/2 weeks of radiotherapy to the left breast and regional nodes with deep inspiration breath hold technique. Written consent is obtained and placed in the chart, a copy was provided to the patient.  She will simulate today.     In a visit lasting 45 minutes, greater than 50% of the time was spent face to face reviewing her case, as well as in preparation of, discussing, and coordinating the patient's care.      Carola Rhine, [facility]    **Disclaimer: This note was dictated with voice recognition software. Similar sounding words can inadvertently be transcribed and this note may contain transcription errors which may not have been corrected upon publication of note.**

## 2022-10-24 ENCOUNTER — Ambulatory Visit: Payer: BC Managed Care – PPO | Admitting: Physical Therapy

## 2022-10-24 ENCOUNTER — Encounter: Payer: Self-pay | Admitting: Physical Therapy

## 2022-10-24 DIAGNOSIS — C50412 Malignant neoplasm of upper-outer quadrant of left female breast: Secondary | ICD-10-CM

## 2022-10-24 DIAGNOSIS — M25612 Stiffness of left shoulder, not elsewhere classified: Secondary | ICD-10-CM

## 2022-10-24 DIAGNOSIS — R293 Abnormal posture: Secondary | ICD-10-CM

## 2022-10-24 NOTE — Therapy (Signed)
OUTPATIENT PHYSICAL THERAPY BREAST CANCER POST OP FOLLOW UP   Patient Name: Caitlyn Torres MRN: [id number] DOB:[birth year], 61 y.o., female Today's Date: 10/24/2022  END OF SESSION:  PT End of Session - 10/24/22 1209     Visit Number 2    Number of Visits 12    Date for PT Re-Evaluation 11/30/22    PT Start Time 1208    PT Stop Time 1250    PT Time Calculation (min) 42 min    Activity Tolerance Patient tolerated treatment well    Behavior During Therapy [facility] for tasks assessed/performed             Past Medical History:  Diagnosis Date   Anxiety    Arthritis    Cancer ([place]) 03/2022   L breast   Depression    Family history of breast cancer 05/15/2022   Family history of pancreatic cancer 05/15/2022   Family history of prostate cancer 05/15/2022   GERD (gastroesophageal reflux disease)    History of hiatal hernia    patient believes she may have been told she has this years ago   Past Surgical History:  Procedure Laterality Date   BREAST BIOPSY  09/19/2022   MM LT RADIOACTIVE SEED EA ADD LESION LOC MAMMO GUIDE 09/19/2022 [facility]AMMO[facility]AP[facility]   BREAST BIOPSY  09/19/2022   MM LT RADIOACTIVE SEED LOC MAMMO GUIDE 09/19/2022 [facility]AMMO[facility]   BREAST LUMPECTOMY WITH RADIOACTIVE SEED AND AXILLARY LYMPH NODE DISSECTION Left 09/20/2022   Procedure: LEFT BREAST SEED LUMPECTOMY, LEFT AXILLARY LYMPH NODE DISSECTION;  Surgeon: Erroll Luna, MD;  Location: [place];  Service: General;  Laterality: Left;  GEN & PEC BLOCK   CERVIX SURGERY     Laser removal of pre-cancer cells   LASIK Bilateral    PORTACATH PLACEMENT Right 04/20/2022   Procedure: PORT PLACEMENT;  Surgeon: Erroll Luna, MD;  Location: [place];  Service: General;  Laterality: Right;   Patient Active Problem List   Diagnosis Date Noted   Other fatigue 08/28/2022   Acute pain 08/28/2022   Genetic testing 06/01/2022   Family history of breast cancer 05/15/2022   Family history of pancreatic cancer 05/15/2022    Family history of prostate cancer 05/15/2022   Port-A-Cath in place 04/28/2022   Malignant neoplasm of upper-outer quadrant of left breast in female, estrogen receptor positive ([place]) 03/29/2022    PCP: Sidney Ace, MD  REFERRING PROVIDER: Nicholas Lose, MD  REFERRING DIAG: Left Breast Cancer  THERAPY DIAG:  Stiffness of L shoulder Abnormal posture  Rationale for Evaluation and Treatment: Rehabilitation  ONSET DATE: 03/14/2022  SUBJECTIVE:  SUBJECTIVE STATEMENT: I have been feeling better since I started exercising.   PERTINENT HISTORY:  03/14/2022:Screening detected left breast masses 1.5 cm and 1.3 cm with enlarged left axillary lymph node, biopsy revealed grade 3 IDC with lymph node being positive, ER 95%, PR 0%, HER2 2+ by IHC and FISH positive with a ratio 2.45 and a copy #4.9, the second biopsy was HER2 negative with a ratio  1. Neoadjuvant chemotherapy with Herceptin Perjeta and letrozole for 8 cycles.  (Our original recommendation is TCHP x6 cycles but patient is not willing to go through chemo) 2. left lumpectomy and ALND 09/20/2022: 0.6 cm IDC 2/14 lymph nodes, margins negative, ER 95%, PR 0%, HER2 positive 3. Followed by adjuvant radiation therapy  4.  Adjuvant antiestrogen therapy with neratinib She had left lumpectomy on 09/20/2022 with 2/14 LN's  PATIENT GOALS:  Reassess how my recovery is going related to arm function, pain, and swelling.  PAIN:  Are you having pain? No no pain at rest  PRECAUTIONS: Recent Surgery, left UE Lymphedema risk,   ACTIVITY LEVEL / LEISURE: not yet,   OBJECTIVE:   PATIENT SURVEYS:  QUICK DASH: 34.09  OBSERVATIONS: Incisions healed with glue still present both incisions. Mild firmness under breast incision and foam pad made to place over it in bra.  Several cords visible and palpable in axillary region and running to medial elbow.  POSTURE:  Forward head rounded shoulders  LYMPHEDEMA ASSESSMENT:   UPPER EXTREMITY AROM/PROM:   A/PROM RIGHT   eval    Shoulder extension 47  Shoulder flexion 163  Shoulder abduction 180  Shoulder internal rotation 67  Shoulder external rotation 107                          (Blank rows = not tested)   A/PROM LEFT   eval LEFT 10/19/2022 LEFT 10/24/22  Shoulder extension 60 40 68  Shoulder flexion 165 142 147  Shoulder abduction 180 74 114  Shoulder internal rotation 57    Shoulder external rotation 107                            (Blank rows = not tested)     CERVICAL AROM: All within normal limits:          UPPER EXTREMITY STRENGTH: WNL right, Left NT due to sx     LYMPHEDEMA ASSESSMENTS:    LANDMARK RIGHT   Eval 03/31/2022 RIGHT 10/19/2022  10 cm proximal to olecranon process 24.0 22.7  Olecranon process 22.3 21.6  10 cm proximal to ulnar styloid process 20.9 20.4  Just proximal to ulnar styloid process 14.6 14.35  Across hand at thumb web space 19.3 19.0  At base of 2nd digit 5.9 5.6  (Blank rows = not tested)   [facility] LEFT   Eval 03/31/2022 Left 10/19/2022  10 cm proximal to olecranon process 23.6 22.8  Olecranon process 21.6 21.6  10 cm proximal to ulnar styloid process 19.5 18.9  Just proximal to ulnar styloid process 14.15 14.5  Across hand at thumb web space 18.45 18.0  At base of 2nd digit 5.7 5.4  (Blank rows = not tested)      Surgery type/Date: Left lumpectomy with SLNB Number of lymph nodes removed: 2/14 Current/past treatment (chemo, radiation, hormone therapy): Herceptin, radiation pending, anti estrogens Other symptoms:  Heaviness/tightness Yes Pain Yes Pitting edema No Infections No Decreased scar mobility No Stemmer sign  No  TREATMENT PERFORMED TODAY:  10/24/22:  Pulleys x 2 min in direction of abduction and then x 2 min in direction of flexion  with pt returning therapist demo  Ball up wall x 10 reps in direction of flexion with pt returning demo  Ball up wall x 10 reps in to abduction on L side with pt returning demo  PROM in to flexion and abduction to pt's tolerance  MFR to cording extending from L axilla down upper forearm and down towards lumpectomy scar with at least 4 cords palpable and visible  Gentle STM to area surrounding lumpectomy scar that is fibrotic   PATIENT EDUCATION:  Education details: supine flexion and stargazer, standing wall slide, sitting or standing scapular retraction. Person educated: Patient Education method: Explanation, Demonstration, and Handouts Education comprehension: verbalized understanding and returned demonstration  HOME EXERCISE PROGRAM: Reviewed previously given post op HEP. Practiced each x 5-7 reps and gave pt new handout secondary to misplacing hers.  ASSESSMENT:  CLINICAL IMPRESSION: Began AAROM exercises today and did well with these. She did not have any pain just felt a stretch and had nearly full ROM with active assist. Focused session on myofascial release to numerous cords in pt's L axilla extending down towards lumpectomy scar to down upper arm. Educated pt on importance of stretching for cording.  Pt will benefit from skilled therapeutic intervention to improve on the following deficits: Decreased knowledge of precautions, impaired UE functional use, pain, decreased ROM, postural dysfunction.   PT treatment/interventions: ADL/Self care home management, Therapeutic exercises, Therapeutic activity, Neuromuscular re-education, Patient/Family education, Self Care, Orthotic/Fit training, scar mobilization, Manual therapy, and Re-evaluation   GOALS: Goals reviewed with patient? Yes  LONG TERM GOALS:  (STG=LTG)  GOALS Name Target Date  Goal status  1 Pt will demonstrate she has regained full shoulder ROM and function post operatively compared to baselines.  Baseline: 11/30/2022  IN PROGRESS  2 Pt will have quick dash no greater than 15% to demonstrate improved function 11/30/2022 INITIAL  3 Pt will have improved pain by 50% or greater 11/30/2022 INITIAL  4 Pt will be fit for a compression sleeve/gauntlet and will be independent in donning and doffing 11/30/2022 INITIAL     PLAN:  PT FREQUENCY/DURATION: 2X/week x 6 weeks  PLAN FOR NEXT SESSION: pulleys, ball, Print follow up info,STM left UQ, AAROM, PROM, MFR to cording, scar mobs Give script and info to [facility] get sleeve, update HEP prn, sign up for ABC in Jan.   [place]  [address], [address]  [place] [address]  [phone]  After Breast Cancer Class It is recommended you attend the ABC class to be educated on lymphedema risk reduction. This class is free of charge and lasts for 1 hour. It is a 1-time class. You will need to download the Webex app either on your phone or computer. We will send you a link the night before or the morning of the class. You should be able to click on that link to join the class. This is not a confidential class. You don't have to turn your camera on, but other participants may be able to see your email address.  Scar massage You can begin gentle scar massage to you incision sites. Gently place one hand on the incision and move the skin (without sliding on the skin) in various directions. Do this for a few minutes and then you can gently massage either coconut oil or  vitamin E cream into the scars.  Compression garment You should continue wearing your compression bra until you feel like you no longer have swelling.  Home exercise Program Continue doing the exercises you were given until you feel like you can do them without feeling any tightness at the end.   Walking Program Studies show that 30 minutes of walking per day (fast enough to elevate your heart rate) can significantly reduce the risk of a cancer recurrence. If you can't  walk due to other medical reasons, we encourage you to find another activity you could do (like a stationary bike or water exercise).  Posture After breast cancer surgery, people frequently sit with rounded shoulders posture because it puts their incisions on slack and feels better. If you sit like this and scar tissue forms in that position, you can become very tight and have pain sitting or standing with good posture. Try to be aware of your posture and sit and stand up tall to heal properly.  Follow up PT: It is recommended you return every 3 months for the first 3 years following surgery to be assessed on the SOZO machine for an L-Dex score. This helps prevent clinically significant lymphedema in 95% of patients. These follow up screens are 10 minute appointments that you are not billed for.  [facility] [place], PT 10/24/2022, 1:11 PM

## 2022-10-25 ENCOUNTER — Encounter: Payer: Self-pay | Admitting: Radiation Oncology

## 2022-10-25 ENCOUNTER — Ambulatory Visit
Admission: RE | Admit: 2022-10-25 | Discharge: 2022-10-25 | Disposition: A | Discharge status: Home / Self Care | Payer: BC Managed Care – PPO | Source: Ambulatory Visit | Attending: Radiation Oncology | Admitting: Radiation Oncology

## 2022-10-25 ENCOUNTER — Other Ambulatory Visit: Payer: Self-pay

## 2022-10-25 VITALS — BP 149/78 | HR 84 | Temp 97.6°F | Resp 18 | Ht 63.0 in | Wt 117.2 lb

## 2022-10-25 DIAGNOSIS — C50412 Malignant neoplasm of upper-outer quadrant of left female breast: Secondary | ICD-10-CM | POA: Diagnosis present

## 2022-10-25 DIAGNOSIS — K449 Diaphragmatic hernia without obstruction or gangrene: Secondary | ICD-10-CM | POA: Diagnosis not present

## 2022-10-25 DIAGNOSIS — Z79899 Other long term (current) drug therapy: Secondary | ICD-10-CM | POA: Insufficient documentation

## 2022-10-25 DIAGNOSIS — Z87891 Personal history of nicotine dependence: Secondary | ICD-10-CM | POA: Insufficient documentation

## 2022-10-25 DIAGNOSIS — Z170 Estrogen receptor positive status [ER+]: Secondary | ICD-10-CM | POA: Insufficient documentation

## 2022-10-25 DIAGNOSIS — K219 Gastro-esophageal reflux disease without esophagitis: Secondary | ICD-10-CM | POA: Diagnosis not present

## 2022-10-25 DIAGNOSIS — Z800 Family history of malignant neoplasm of digestive organs: Secondary | ICD-10-CM | POA: Insufficient documentation

## 2022-10-25 DIAGNOSIS — Z79811 Long term (current) use of aromatase inhibitors: Secondary | ICD-10-CM | POA: Diagnosis not present

## 2022-10-25 DIAGNOSIS — Z510 Encounter for antineoplastic radiation therapy: Secondary | ICD-10-CM | POA: Diagnosis not present

## 2022-10-25 DIAGNOSIS — Z803 Family history of malignant neoplasm of breast: Secondary | ICD-10-CM | POA: Diagnosis not present

## 2022-10-25 NOTE — Progress Notes (Signed)
Follow-up-new nursing interview for Malignant neoplasm of upper-outer quadrant of left breast in female, estrogen receptor positive ([place]). I verified patient's identity and began nursing interview.  Patient reports very mild LT breast tenderness upon palpation but is doing well. No other issues reported at this time.  Meaningful use complete. Malignant neoplasm of upper-outer quadrant of left breast in female, estrogen receptor positive ([place]).- NO chances of pregnancy.  BP (!) 149/78 (BP Location: Right Arm, Patient Position: Sitting, Cuff Size: Normal)   Pulse 84   Temp 97.6 F (36.4 C) (Temporal)   Resp 18   Ht '5\' 3"'$  (1.6 m)   Wt 117 lb 4 oz (53.2 kg)   LMP 09/25/2019   SpO2 98%   BMI 20.77 kg/m   This concludes the interview.   Leandra Kern, LPN

## 2022-10-26 ENCOUNTER — Ambulatory Visit: Payer: BC Managed Care – PPO

## 2022-10-26 DIAGNOSIS — R293 Abnormal posture: Secondary | ICD-10-CM

## 2022-10-26 DIAGNOSIS — C50412 Malignant neoplasm of upper-outer quadrant of left female breast: Secondary | ICD-10-CM

## 2022-10-26 DIAGNOSIS — M25612 Stiffness of left shoulder, not elsewhere classified: Secondary | ICD-10-CM

## 2022-10-26 NOTE — Therapy (Signed)
OUTPATIENT PHYSICAL THERAPY BREAST CANCER POST OP FOLLOW UP   Patient Name: Caitlyn Torres MRN: [id number] DOB:12/28/1960, 61 y.o., female Today's Date: 10/26/2022  END OF SESSION:  PT End of Session - 10/26/22 1510     Visit Number 3    Number of Visits 12    Date for PT Re-Evaluation 11/30/22    PT Start Time 7673   late   PT Stop Time 1555    PT Time Calculation (min) 44 min    Activity Tolerance Patient tolerated treatment well    Behavior During Therapy [facility] for tasks assessed/performed             Past Medical History:  Diagnosis Date   Anxiety    Arthritis    Cancer ([place]) 03/2022   L breast   Depression    Family history of breast cancer 05/15/2022   Family history of pancreatic cancer 05/15/2022   Family history of prostate cancer 05/15/2022   GERD (gastroesophageal reflux disease)    History of hiatal hernia    patient believes she may have been told she has this years ago   Past Surgical History:  Procedure Laterality Date   BREAST BIOPSY  09/19/2022   MM LT RADIOACTIVE SEED EA ADD LESION LOC MAMMO GUIDE 09/19/2022 [facility] MAMMOGRAP[facility]   BREAST BIOPSY  09/19/2022   MM LT RADIOACTIVE SEED LOC MAMMO GUIDE 09/19/2022 [facility]AMMO[facility]   BREAST LUMPECTOMY WITH RADIOACTIVE SEED AND AXILLARY LYMPH NODE DISSECTION Left 09/20/2022   Procedure: LEFT BREAST SEED LUMPECTOMY, LEFT AXILLARY LYMPH NODE DISSECTION;  Surgeon: Erroll Luna, MD;  Location: [place];  Service: General;  Laterality: Left;  GEN & PEC BLOCK   CERVIX SURGERY     Laser removal of pre-cancer cells   LASIK Bilateral    PORTACATH PLACEMENT Right 04/20/2022   Procedure: PORT PLACEMENT;  Surgeon: Erroll Luna, MD;  Location: [place];  Service: General;  Laterality: Right;   Patient Active Problem List   Diagnosis Date Noted   Other fatigue 08/28/2022   Acute pain 08/28/2022   Genetic testing 06/01/2022   Family history of breast cancer 05/15/2022   Family history of pancreatic cancer 05/15/2022    Family history of prostate cancer 05/15/2022   Port-A-Cath in place 04/28/2022   Malignant neoplasm of upper-outer quadrant of left breast in female, estrogen receptor positive ([place]) 03/29/2022    PCP: Sidney Ace, MD  REFERRING PROVIDER: Nicholas Lose, MD  REFERRING DIAG: Left Breast Cancer  THERAPY DIAG:  Stiffness of L shoulder Stiffness of left shoulder, not elsewhere classified  Abnormal posture  Malignant neoplasm of upper-outer quadrant of left female breast, unspecified estrogen receptor status ([place])  Rationale for Evaluation and Treatment: Rehabilitation  ONSET DATE: 03/14/2022  SUBJECTIVE:  SUBJECTIVE STATEMENT: I had the CT simulation yesterday and it went OK. My arm is aching underneath since lunch time today and it is tingling all the way down my arm to the wrist. I start the radiation the day after Christmas for 6 weeks.  PERTINENT HISTORY:  03/14/2022:Screening detected left breast masses 1.5 cm and 1.3 cm with enlarged left axillary lymph node, biopsy revealed grade 3 IDC with lymph node being positive, ER 95%, PR 0%, HER2 2+ by IHC and FISH positive with a ratio 2.45 and a copy #4.9, the second biopsy was HER2 negative with a ratio  1. Neoadjuvant chemotherapy with Herceptin Perjeta and letrozole for 8 cycles.  (Our original recommendation is TCHP x6 cycles but patient is not willing to go through chemo) 2. left lumpectomy and ALND 09/20/2022: 0.6 cm IDC 2/14 lymph nodes, margins negative, ER 95%, PR 0%, HER2 positive 3. Followed by adjuvant radiation therapy  4.  Adjuvant antiestrogen therapy with neratinib She had left lumpectomy on 09/20/2022 with 2/14 LN's  PATIENT GOALS:  Reassess how my recovery is going related to arm function, pain, and swelling.  PAIN:  Are you having  pain? Yes 6/10 annoying Aggravating:at rest for some reason Makes better; stretching  PRECAUTIONS: Recent Surgery, left UE Lymphedema risk,   ACTIVITY LEVEL / LEISURE: not yet,   OBJECTIVE:   PATIENT SURVEYS:  QUICK DASH: 34.09  OBSERVATIONS: Incisions healed with glue still present both incisions. Mild firmness under breast incision and foam pad made to place over it in bra. Several cords visible and palpable in axillary region and running to medial elbow.  POSTURE:  Forward head rounded shoulders  LYMPHEDEMA ASSESSMENT:   UPPER EXTREMITY AROM/PROM:   A/PROM RIGHT   eval    Shoulder extension 47  Shoulder flexion 163  Shoulder abduction 180  Shoulder internal rotation 67  Shoulder external rotation 107                          (Blank rows = not tested)   A/PROM LEFT   eval LEFT 10/19/2022 LEFT 10/24/22  Shoulder extension 60 40 68  Shoulder flexion 165 142 147  Shoulder abduction 180 74 114  Shoulder internal rotation 57    Shoulder external rotation 107                            (Blank rows = not tested)     CERVICAL AROM: All within normal limits:          UPPER EXTREMITY STRENGTH: WNL right, Left NT due to sx     LYMPHEDEMA ASSESSMENTS:    LANDMARK RIGHT   Eval 03/31/2022 RIGHT 10/19/2022  10 cm proximal to olecranon process 24.0 22.7  Olecranon process 22.3 21.6  10 cm proximal to ulnar styloid process 20.9 20.4  Just proximal to ulnar styloid process 14.6 14.35  Across hand at thumb web space 19.3 19.0  At base of 2nd digit 5.9 5.6  (Blank rows = not tested)   [facility] LEFT   Eval 03/31/2022 Left 10/19/2022  10 cm proximal to olecranon process 23.6 22.8  Olecranon process 21.6 21.6  10 cm proximal to ulnar styloid process 19.5 18.9  Just proximal to ulnar styloid process 14.15 14.5  Across hand at thumb web space 18.45 18.0  At base of 2nd digit 5.7 5.4  (Blank rows = not tested)      Surgery type/Date:  Left lumpectomy with  SLNB Number of lymph nodes removed: 2/14 Current/past treatment (chemo, radiation, hormone therapy): Herceptin, radiation pending, anti estrogens Other symptoms:  Heaviness/tightness Yes Pain Yes Pitting edema No Infections No Decreased scar mobility No Stemmer sign No  TREATMENT PERFORMED TODAY:   10/26/2922  Pulleys x 2 min in direction of abduction and then x 2 min in direction of flexion and scaption  PROM in to flexion, scaption, and abduction to pt's tolerance  MFR to cording extending from L axilla down upper forearm and down towards lumpectomy scar   STM to left pectorals, UT and lats in supine and to medial upper arm and forearm after STM Small TG soft cut for left UE to aid discomfort Pt given script to get compression sleeve and gauntlet       10/24/22:  Pulleys x 2 min in direction of abduction and then x 2 min in direction of flexion with pt returning therapist demo  Ball up wall x 10 reps in direction of flexion with pt returning demo  Ball up wall x 10 reps in to abduction on L side with pt returning demo  PROM in to flexion and abduction to pt's tolerance  MFR to cording extending from L axilla down upper forearm and down towards lumpectomy scar with at least 4 cords palpable and visible  Gentle STM to area surrounding lumpectomy scar that is fibrotic   PATIENT EDUCATION:  Education details: supine flexion and stargazer, standing wall slide, sitting or standing scapular retraction. Person educated: Patient Education method: Explanation, Demonstration, and Handouts Education comprehension: verbalized understanding and returned demonstration  HOME EXERCISE PROGRAM: Reviewed previously given post op HEP. Practiced each x 5-7 reps and gave pt new handout secondary to misplacing hers.  ASSESSMENT:  CLINICAL IMPRESSION: Continued pulleys, soft tissue mobilization and PROM. Pt was very uncomfortable before therapy but felt much better afterwards. She also  thought TG soft helped to decrease the pain in the arm and there was no tingling after therapy  Pt will benefit from skilled therapeutic intervention to improve on the following deficits: Decreased knowledge of precautions, impaired UE functional use, pain, decreased ROM, postural dysfunction.   PT treatment/interventions: ADL/Self care home management, Therapeutic exercises, Therapeutic activity, Neuromuscular re-education, Patient/Family education, Self Care, Orthotic/Fit training, scar mobilization, Manual therapy, and Re-evaluation   GOALS: Goals reviewed with patient? Yes  LONG TERM GOALS:  (STG=LTG)  GOALS Name Target Date  Goal status  1 Pt will demonstrate she has regained full shoulder ROM and function post operatively compared to baselines.  Baseline: 11/30/2022 IN PROGRESS  2 Pt will have quick dash no greater than 15% to demonstrate improved function 11/30/2022 INITIAL  3 Pt will have improved pain by 50% or greater 11/30/2022 INITIAL  4 Pt will be fit for a compression sleeve/gauntlet and will be independent in donning and doffing 11/30/2022 INITIAL     PLAN:  PT FREQUENCY/DURATION: 2X/week x 6 weeks  PLAN FOR NEXT SESSION: pulleys, ball, Print follow up info,STM left UQ, AAROM, PROM, MFR to cording, scar mobs , did pt  [facility]?, update HEP prn, sign up for ABC in Jan.   [facility]  [address], [address]  [place] [address]  [phone]  After Breast Cancer Class It is recommended you attend the ABC class to be educated on lymphedema risk reduction. This class is free of charge and lasts for 1 hour. It is a 1-time class. You  will need to download the Webex app either on your phone or computer. We will send you a link the night before or the morning of the class. You should be able to click on that link to join the class. This is not a confidential class. You don't have to turn your camera on, but other participants  may be able to see your email address.  Scar massage You can begin gentle scar massage to you incision sites. Gently place one hand on the incision and move the skin (without sliding on the skin) in various directions. Do this for a few minutes and then you can gently massage either coconut oil or vitamin E cream into the scars.  Compression garment You should continue wearing your compression bra until you feel like you no longer have swelling.  Home exercise Program Continue doing the exercises you were given until you feel like you can do them without feeling any tightness at the end.   Walking Program Studies show that 30 minutes of walking per day (fast enough to elevate your heart rate) can significantly reduce the risk of a cancer recurrence. If you can't walk due to other medical reasons, we encourage you to find another activity you could do (like a stationary bike or water exercise).  Posture After breast cancer surgery, people frequently sit with rounded shoulders posture because it puts their incisions on slack and feels better. If you sit like this and scar tissue forms in that position, you can become very tight and have pain sitting or standing with good posture. Try to be aware of your posture and sit and stand up tall to heal properly.  Follow up PT: It is recommended you return every 3 months for the first 3 years following surgery to be assessed on the SOZO machine for an L-Dex score. This helps prevent clinically significant lymphedema in 95% of patients. These follow up screens are 10 minute appointments that you are not billed for.  Claris Pong, PT 10/26/2022, 3:58 PM

## 2022-10-31 ENCOUNTER — Ambulatory Visit: Payer: BC Managed Care – PPO

## 2022-10-31 ENCOUNTER — Encounter: Payer: Self-pay | Admitting: *Deleted

## 2022-10-31 DIAGNOSIS — R293 Abnormal posture: Secondary | ICD-10-CM

## 2022-10-31 DIAGNOSIS — M25612 Stiffness of left shoulder, not elsewhere classified: Secondary | ICD-10-CM

## 2022-10-31 DIAGNOSIS — C50412 Malignant neoplasm of upper-outer quadrant of left female breast: Secondary | ICD-10-CM

## 2022-10-31 NOTE — Therapy (Signed)
OUTPATIENT PHYSICAL THERAPY BREAST CANCER POST OP FOLLOW UP   Patient Name: Caitlyn Torres MRN: [id number] DOB:1961/07/05, 61 y.o., female Today's Date: 10/31/2022  END OF SESSION:  PT End of Session - 10/31/22 1213     Visit Number 4    Number of Visits 12    Date for PT Re-Evaluation 11/30/22    PT Start Time 1215    PT Stop Time 1300    PT Time Calculation (min) 45 min    Activity Tolerance Patient tolerated treatment well    Behavior During Therapy [facility] for tasks assessed/performed             Past Medical History:  Diagnosis Date   Anxiety    Arthritis    Cancer ([place]) 03/2022   L breast   Depression    Family history of breast cancer 05/15/2022   Family history of pancreatic cancer 05/15/2022   Family history of prostate cancer 05/15/2022   GERD (gastroesophageal reflux disease)    History of hiatal hernia    patient believes she may have been told she has this years ago   Past Surgical History:  Procedure Laterality Date   BREAST BIOPSY  09/19/2022   MM LT RADIOACTIVE SEED EA ADD LESION LOC MAMMO GUIDE 09/19/2022 [facility]MOGRAP[facility]   BREAST BIOPSY  09/19/2022   MM LT RADIOACTIVE SEED LOC MAMMO GUIDE 09/19/2022 [facility]AMMO[facility]   BREAST LUMPECTOMY WITH RADIOACTIVE SEED AND AXILLARY LYMPH NODE DISSECTION Left 09/20/2022   Procedure: LEFT BREAST SEED LUMPECTOMY, LEFT AXILLARY LYMPH NODE DISSECTION;  Surgeon: Erroll Luna, MD;  Location: [place];  Service: General;  Laterality: Left;  GEN & PEC BLOCK   CERVIX SURGERY     Laser removal of pre-cancer cells   LASIK Bilateral    PORTACATH PLACEMENT Right 04/20/2022   Procedure: PORT PLACEMENT;  Surgeon: Erroll Luna, MD;  Location: [place];  Service: General;  Laterality: Right;   Patient Active Problem List   Diagnosis Date Noted   Other fatigue 08/28/2022   Acute pain 08/28/2022   Genetic testing 06/01/2022   Family history of breast cancer 05/15/2022   Family history of pancreatic cancer 05/15/2022    Family history of prostate cancer 05/15/2022   Port-A-Cath in place 04/28/2022   Malignant neoplasm of upper-outer quadrant of left breast in female, estrogen receptor positive ([place]) 03/29/2022    PCP: Sidney Ace, MD  REFERRING PROVIDER: Nicholas Lose, MD  REFERRING DIAG: Left Breast Cancer  THERAPY DIAG:  Stiffness of L shoulder Stiffness of left shoulder, not elsewhere classified  Abnormal posture  Malignant neoplasm of upper-outer quadrant of left female breast, unspecified estrogen receptor status ([place])  Rationale for Evaluation and Treatment: Rehabilitation  ONSET DATE: 03/14/2022  SUBJECTIVE:  SUBJECTIVE STATEMENT: I only worked half a day yesterday and went home and fell asleep with my coat on. The TG soft helped the day I wore it. It hasn't been tingly and achy like it was so I haven't been wearing it. I made an appt on line to get measured for my sleeve.  I go to [facility] [employer] for bras.   PERTINENT HISTORY:  03/14/2022:Screening detected left breast masses 1.5 cm and 1.3 cm with enlarged left axillary lymph node, biopsy revealed grade 3 IDC with lymph node being positive, ER 95%, PR 0%, HER2 2+ by IHC and FISH positive with a ratio 2.45 and a copy #4.9, the second biopsy was HER2 negative with a ratio  1. Neoadjuvant chemotherapy with Herceptin Perjeta and letrozole for 8 cycles.  (Our original recommendation is TCHP x6 cycles but patient is not willing to go through chemo) 2. left lumpectomy and ALND 09/20/2022: 0.6 cm IDC 2/14 lymph nodes, margins negative, ER 95%, PR 0%, HER2 positive 3. Followed by adjuvant radiation therapy  4.  Adjuvant antiestrogen therapy with neratinib She had left lumpectomy on 09/20/2022 with 2/14 LN's  PATIENT GOALS:  Reassess how my recovery is going  related to arm function, pain, and swelling.  PAIN:  Are you having pain? None presently 0/10 Aggravating:at rest for some reason Makes better; stretching  PRECAUTIONS: Recent Surgery, left UE Lymphedema risk,   ACTIVITY LEVEL / LEISURE: not yet,   OBJECTIVE:   PATIENT SURVEYS:  QUICK DASH: 34.09  OBSERVATIONS: Incisions healed with glue still present both incisions. Mild firmness under breast incision and foam pad made to place over it in bra. Several cords visible and palpable in axillary region and running to medial elbow.  POSTURE:  Forward head rounded shoulders  LYMPHEDEMA ASSESSMENT:   UPPER EXTREMITY AROM/PROM:   A/PROM RIGHT   eval    Shoulder extension 47  Shoulder flexion 163  Shoulder abduction 180  Shoulder internal rotation 67  Shoulder external rotation 107                          (Blank rows = not tested)   A/PROM LEFT   eval LEFT 10/19/2022 LEFT 10/24/22 LEFT 10/31/2022  Shoulder extension 60 40 68   Shoulder flexion 165 142 147 162  Shoulder abduction 180 74 114 143  Shoulder internal rotation 57     Shoulder external rotation 107                             (Blank rows = not tested)     CERVICAL AROM: All within normal limits:          UPPER EXTREMITY STRENGTH: WNL right, Left NT due to sx     LYMPHEDEMA ASSESSMENTS:    LANDMARK RIGHT   Eval 03/31/2022 RIGHT 10/19/2022  10 cm proximal to olecranon process 24.0 22.7  Olecranon process 22.3 21.6  10 cm proximal to ulnar styloid process 20.9 20.4  Just proximal to ulnar styloid process 14.6 14.35  Across hand at thumb web space 19.3 19.0  At base of 2nd digit 5.9 5.6  (Blank rows = not tested)   [facility] LEFT   Eval 03/31/2022 Left 10/19/2022  10 cm proximal to olecranon process 23.6 22.8  Olecranon process 21.6 21.6  10 cm proximal to ulnar styloid process 19.5 18.9  Just proximal to ulnar styloid process 14.15 14.5  Across  hand at thumb web space 18.45 18.0  At base of 2nd  digit 5.7 5.4  (Blank rows = not tested)      Surgery type/Date: Left lumpectomy with SLNB Number of lymph nodes removed: 2/14 Current/past treatment (chemo, radiation, hormone therapy): Herceptin, radiation pending, anti estrogens Other symptoms:  Heaviness/tightness Yes Pain Yes Pitting edema No Infections No Decreased scar mobility No Stemmer sign No  TREATMENT PERFORMED TODAY:  10/31/2022 14 min late STM to left pectorals, UT and lats in supine with cocoa butter Supine wand x 4 flexion and scaption PROM left shoulder flexion, scaption, abduction MFR techniques with longitudinal stretch and S technique to decrease cording NTS with left arm in different ranges of abduction     10/26/2922  Pulleys x 2 min in direction of abduction and then x 2 min in direction of flexion and scaption  PROM in to flexion, scaption, and abduction to pt's tolerance  MFR to cording extending from L axilla down upper forearm and down towards lumpectomy scar   STM to left pectorals, UT and lats in supine and to medial upper arm and forearm after STM Small TG soft cut for left UE to aid discomfort Pt given script to get compression sleeve and gauntlet       10/24/22:  Pulleys x 2 min in direction of abduction and then x 2 min in direction of flexion with pt returning therapist demo  Ball up wall x 10 reps in direction of flexion with pt returning demo  Ball up wall x 10 reps in to abduction on L side with pt returning demo  PROM in to flexion and abduction to pt's tolerance  MFR to cording extending from L axilla down upper forearm and down towards lumpectomy scar with at least 4 cords palpable and visible  Gentle STM to area surrounding lumpectomy scar that is fibrotic   PATIENT EDUCATION:  Education details: supine flexion and stargazer, standing wall slide, sitting or standing scapular retraction. Person educated: Patient Education method: Explanation, Demonstration, and  Handouts Education comprehension: verbalized understanding and returned demonstration  HOME EXERCISE PROGRAM: Reviewed previously given post op HEP. Practiced each x 5-7 reps and gave pt new handout secondary to misplacing hers.  ASSESSMENT:  CLINICAL IMPRESSION: Good improvement in shoulder ROM but abduction still limited by cording. 1 pop noted by pt with MFR techniques  Pt will benefit from skilled therapeutic intervention to improve on the following deficits: Decreased knowledge of precautions, impaired UE functional use, pain, decreased ROM, postural dysfunction.   PT treatment/interventions: ADL/Self care home management, Therapeutic exercises, Therapeutic activity, Neuromuscular re-education, Patient/Family education, Self Care, Orthotic/Fit training, scar mobilization, Manual therapy, and Re-evaluation   GOALS: Goals reviewed with patient? Yes  LONG TERM GOALS:  (STG=LTG)  GOALS Name Target Date  Goal status  1 Pt will demonstrate she has regained full shoulder ROM and function post operatively compared to baselines.  Baseline: 11/30/2022 IN PROGRESS  2 Pt will have quick dash no greater than 15% to demonstrate improved function 11/30/2022 INITIAL  3 Pt will have improved pain by 50% or greater 11/30/2022 INITIAL  4 Pt will be fit for a compression sleeve/gauntlet and will be independent in donning and doffing 11/30/2022 INITIAL     PLAN:  PT FREQUENCY/DURATION: 2X/week x 6 weeks  PLAN FOR NEXT SESSION: pulleys, ball, Print follow up info,STM left UQ, AAROM, PROM, MFR to cording, scar mobs ,  update HEP prn, sign up for ABC in Jan.   [facility]  [place], [address]  [place] [address]  [phone]  After Breast Cancer Class It is recommended you attend the ABC class to be educated on lymphedema risk reduction. This class is free of charge and lasts for 1 hour. It is a 1-time class. You will need to download the Webex app either on your  phone or computer. We will send you a link the night before or the morning of the class. You should be able to click on that link to join the class. This is not a confidential class. You don't have to turn your camera on, but other participants may be able to see your email address.  Scar massage You can begin gentle scar massage to you incision sites. Gently place one hand on the incision and move the skin (without sliding on the skin) in various directions. Do this for a few minutes and then you can gently massage either coconut oil or vitamin E cream into the scars.  Compression garment You should continue wearing your compression bra until you feel like you no longer have swelling.  Home exercise Program Continue doing the exercises you were given until you feel like you can do them without feeling any tightness at the end.   Walking Program Studies show that 30 minutes of walking per day (fast enough to elevate your heart rate) can significantly reduce the risk of a cancer recurrence. If you can't walk due to other medical reasons, we encourage you to find another activity you could do (like a stationary bike or water exercise).  Posture After breast cancer surgery, people frequently sit with rounded shoulders posture because it puts their incisions on slack and feels better. If you sit like this and scar tissue forms in that position, you can become very tight and have pain sitting or standing with good posture. Try to be aware of your posture and sit and stand up tall to heal properly.  Follow up PT: It is recommended you return every 3 months for the first 3 years following surgery to be assessed on the SOZO machine for an L-Dex score. This helps prevent clinically significant lymphedema in 95% of patients. These follow up screens are 10 minute appointments that you are not billed for.  Claris Pong, PT 10/31/2022, 1:06 PM

## 2022-11-01 ENCOUNTER — Ambulatory Visit: Payer: BC Managed Care – PPO | Admitting: Radiation Oncology

## 2022-11-01 NOTE — Progress Notes (Unsigned)
Patient Care Team: Bradd Canary, MD as PCP - General (Family Medicine) Mauro Kaufmann, RN as Oncology Nurse Navigator Rockwell Germany, RN as Oncology Nurse Navigator Nicholas Lose, MD as Consulting Physician (Hematology and Oncology)  DIAGNOSIS: No diagnosis found.  SUMMARY OF ONCOLOGIC HISTORY: Oncology History  Malignant neoplasm of upper-outer quadrant of left breast in female, estrogen receptor positive ([place])  03/14/2022 Initial Diagnosis   Screening detected left breast masses 1.5 cm and 1.3 cm with enlarged left axillary lymph node, biopsy revealed grade 3 IDC with lymph node being positive, ER 95%, PR 0%, HER2 2+ by IHC and FISH positive with a ratio 2.45 and a copy #4.9, the second biopsy was HER2 negative with a ratio 2.15 and a copy #3.55   03/29/2022 Cancer Staging   Staging form: Breast, AJCC 8th Edition - Clinical: Stage IIA (cT1c, cN1, cM0, G3, ER+, PR-, HER2+) - Signed by Nicholas Lose, MD on 03/29/2022 Stage prefix: Initial diagnosis Histologic grading system: 3 grade system   03/29/2022 -  Anti-estrogen oral therapy   Letrozole daily   04/21/2022 -  Chemotherapy   Herceptin/Perjeta every 21 days initially x 3, will reassess with MRI and then will decide whether to continue or to add chemo.  (Original recommendation was TCHP x 6)      Genetic Testing   [facility] CancerNext-Expanded Panel was Negative. Report date is 05/30/2022.  The CancerNext-Expanded gene panel offered by [facility] and includes sequencing, rearrangement, and RNA analysis for the following 77 genes: AIP, ALK, APC, ATM, AXIN2, BAP1, BARD1, BLM, BMPR1A, BRCA1, BRCA2, BRIP1, CDC73, CDH1, CDK4, CDKN1B, CDKN2A, CHEK2, CTNNA1, DICER1, FANCC, FH, FLCN, GALNT12, KIF1B, LZTR1, MAX, MEN1, MET, MLH1, MSH2, MSH3, MSH6, MUTYH, NBN, NF1, NF2, NTHL1, PALB2, PHOX2B, PMS2, POT1, PRKAR1A, PTCH1, PTEN, RAD51C, RAD51D, RB1, RECQL, RET, SDHA, SDHAF2, SDHB, SDHC, SDHD, SMAD4, SMARCA4, SMARCB1, SMARCE1, STK11, SUFU,  TMEM127, TP53, TSC1, TSC2, VHL and XRCC2 (sequencing and deletion/duplication); EGFR, EGLN1, HOXB13, KIT, MITF, PDGFRA, POLD1, and POLE (sequencing only); EPCAM and GREM1 (deletion/duplication only).    04/21/2022 - 09/14/2022 Chemotherapy   Patient is on Treatment Plan : BREAST Trastuzumab  + Pertuzumab q21d x 13 cycles     10/13/2022 -  Chemotherapy   Patient is on Treatment Plan : BREAST ADO-Trastuzumab Emtansine (Kadcyla) q21d       CHIEF COMPLIANT:   INTERVAL HISTORY: SABRENA GAVITT is a   ALLERGIES:  is allergic to [employer] sodium], aspirin, macrobid [nitrofurantoin], motrin [ibuprofen], and tylenol [acetaminophen].  MEDICATIONS:  Current Outpatient Medications  Medication Sig Dispense Refill   ALPRAZolam (XANAX) 0.25 MG tablet TAKE ONE TABLET BY MOUTH EVERY DAY AS NEEDED FOR ANXIETY     Brimonidine Tartrate (LUMIFY) 0.025 % SOLN Place 1 drop into both eyes daily as needed (red eyes).     carboxymethylcellulose (REFRESH PLUS) 0.5 % SOLN Place 1 drop into both eyes 3 (three) times daily as needed (dry eyes).     cephALEXin (KEFLEX) 500 MG capsule TAKE 1 CAPSULE 3 TIMES A DAY BY MOUTH 5 DAYS.     cetirizine (ZYRTEC) 10 MG tablet Take 10 mg by mouth daily.     EPINEPHrine 0.3 mg/0.3 mL IJ SOAJ injection Inject 0.3 mg into the muscle as needed for anaphylaxis. Prn     Lidocaine-Prilocaine &Lido HCl 2.5-2.5 & 3.88 % KIT APPLY TO AFFECTED AREA ONCE AS DIRECTED     magic mouthwash w/lidocaine SOLN Take 5 mLs by mouth 4 (four) times daily as needed for mouth  pain. 240 mL 1   Multiple Vitamins-Iron (CHLORELLA) CAPS Take 1 capsule by mouth daily.     ondansetron (ZOFRAN) 8 MG tablet Take 1 tablet (8 mg total) by mouth every 8 (eight) hours as needed for nausea or vomiting. 30 tablet 1   OVER THE COUNTER MEDICATION Take 1 capsule by mouth daily. Amla     OVER THE COUNTER MEDICATION Take 1 capsule by mouth daily. Mushroom Complex     OVER THE COUNTER MEDICATION Place 1 Application  vaginally daily as needed (moisture). Femininity     oxyCODONE (OXY IR/ROXICODONE) 5 MG immediate release tablet Take 1 tablet (5 mg total) by mouth every 6 (six) hours as needed for severe pain. 15 tablet 0   prochlorperazine (COMPAZINE) 10 MG tablet Take 1 tablet (10 mg total) by mouth every 6 (six) hours as needed for nausea or vomiting. 30 tablet 1   QUERCETIN PO Take 1 capsule by mouth daily.     sodium chloride (OCEAN) 0.65 % SOLN nasal spray Place 1 spray into both nostrils as needed for congestion.     traMADol (ULTRAM) 50 MG tablet Take 50 mg by mouth every 6 (six) hours as needed for moderate pain.     TURMERIC CURCUMIN PO Take 1 capsule by mouth daily.     No current facility-administered medications for this visit.    PHYSICAL EXAMINATION: ECOG PERFORMANCE STATUS: {CHL ONC ECOG PS:[phone]}  There were no vitals filed for this visit. There were no vitals filed for this visit.  BREAST:*** No palpable masses or nodules in either right or left breasts. No palpable axillary supraclavicular or infraclavicular adenopathy no breast tenderness or nipple discharge. (exam performed in the presence of a chaperone)  LABORATORY DATA:  I have reviewed the data as listed    Latest Ref Rng & Units 10/13/2022    8:57 AM 09/13/2022    8:54 AM 08/24/2022    9:17 AM  CMP  Glucose 70 - 99 mg/dL 110  91  82   BUN 6 - 20 mg/dL _0 Creatinine 0.44 - 1.00 mg/dL 0.62  0.68  0.56   Sodium 135 - 145 mmol/L 139  137  136   Potassium 3.5 - 5.1 mmol/L 4.0  4.3  4.0   Chloride 98 - 111 mmol/L 105  97  104   CO2 22 - 32 mmol/L _1 Calcium 8.9 - 10.3 mg/dL 10.2  10.0  9.2   Total Protein 6.5 - 8.1 g/dL 7.2   6.7   Total Bilirubin 0.3 - 1.2 mg/dL 0.4   0.3   Alkaline Phos 38 - 126 U/L 61   66   AST 15 - 41 U/L 28   28   ALT 0 - 44 U/L 33   33     Lab Results  Component Value Date   WBC 3.3 (L) 10/13/2022   HGB 14.2 10/13/2022   HCT 41.4 10/13/2022   MCV 85.4 10/13/2022   PLT  162 10/13/2022   NEUTROABS 1.6 (L) 10/13/2022    ASSESSMENT & PLAN:  No problem-specific Assessment & Plan notes found for this encounter.    No orders of the defined types were placed in this encounter.  The patient has a good understanding of the overall plan. she agrees with it. she will call with any problems that may develop before the next visit here. Total time spent: 30 mins including face to face time  and time spent for planning, charting and co-ordination of care   Suzzette Righter, [place] 11/01/22    I Gardiner Coins am acting as a Education administrator for [employer]  ***

## 2022-11-02 ENCOUNTER — Inpatient Hospital Stay: Payer: BC Managed Care – PPO

## 2022-11-02 ENCOUNTER — Inpatient Hospital Stay (HOSPITAL_BASED_OUTPATIENT_CLINIC_OR_DEPARTMENT_OTHER): Payer: BC Managed Care – PPO | Admitting: Hematology and Oncology

## 2022-11-02 ENCOUNTER — Other Ambulatory Visit (HOSPITAL_COMMUNITY): Payer: Self-pay

## 2022-11-02 ENCOUNTER — Ambulatory Visit: Payer: BC Managed Care – PPO

## 2022-11-02 VITALS — BP 138/77 | HR 79 | Temp 97.7°F | Resp 18 | Ht 63.0 in | Wt 116.9 lb

## 2022-11-02 DIAGNOSIS — C50412 Malignant neoplasm of upper-outer quadrant of left female breast: Secondary | ICD-10-CM | POA: Diagnosis not present

## 2022-11-02 DIAGNOSIS — Z95828 Presence of other vascular implants and grafts: Secondary | ICD-10-CM

## 2022-11-02 DIAGNOSIS — Z170 Estrogen receptor positive status [ER+]: Secondary | ICD-10-CM

## 2022-11-02 LAB — CBC WITH DIFFERENTIAL (CANCER CENTER ONLY)
Abs Immature Granulocytes: 0.01 10*3/uL (ref 0.00–0.07)
Basophils Absolute: 0.1 10*3/uL (ref 0.0–0.1)
Basophils Relative: 2 %
Eosinophils Absolute: 0.2 10*3/uL (ref 0.0–0.5)
Eosinophils Relative: 5 %
HCT: 39.2 % (ref 36.0–46.0)
Hemoglobin: 13.5 g/dL (ref 12.0–15.0)
Immature Granulocytes: 0 %
Lymphocytes Relative: 39 %
Lymphs Abs: 1.3 10*3/uL (ref 0.7–4.0)
MCH: 29.2 pg (ref 26.0–34.0)
MCHC: 34.4 g/dL (ref 30.0–36.0)
MCV: 84.8 fL (ref 80.0–100.0)
Monocytes Absolute: 0.5 10*3/uL (ref 0.1–1.0)
Monocytes Relative: 14 %
Neutro Abs: 1.4 10*3/uL — ABNORMAL LOW (ref 1.7–7.7)
Neutrophils Relative %: 40 %
Platelet Count: 183 10*3/uL (ref 150–400)
RBC: 4.62 MIL/uL (ref 3.87–5.11)
RDW: 14 % (ref 11.5–15.5)
WBC Count: 3.4 10*3/uL — ABNORMAL LOW (ref 4.0–10.5)
nRBC: 0 % (ref 0.0–0.2)

## 2022-11-02 LAB — CMP (CANCER CENTER ONLY)
ALT: 75 U/L — ABNORMAL HIGH (ref 0–44)
AST: 51 U/L — ABNORMAL HIGH (ref 15–41)
Albumin: 4.2 g/dL (ref 3.5–5.0)
Alkaline Phosphatase: 63 U/L (ref 38–126)
Anion gap: 5 (ref 5–15)
BUN: 7 mg/dL (ref 6–20)
CO2: 29 mmol/L (ref 22–32)
Calcium: 9.8 mg/dL (ref 8.9–10.3)
Chloride: 105 mmol/L (ref 98–111)
Creatinine: 0.65 mg/dL (ref 0.44–1.00)
GFR, Estimated: >60 mL/min (ref >60)
Glucose, Bld: 100 mg/dL — ABNORMAL HIGH (ref 70–99)
Potassium: 3.9 mmol/L (ref 3.5–5.1)
Sodium: 139 mmol/L (ref 135–145)
Total Bilirubin: 0.4 mg/dL (ref 0.3–1.2)
Total Protein: 6.8 g/dL (ref 6.5–8.1)

## 2022-11-02 MED ORDER — SODIUM CHLORIDE 0.9 % IV SOLN
3.6000 mg/kg | Freq: Once | INTRAVENOUS | Status: AC
  Administered 2022-11-02: 200 mg via INTRAVENOUS
  Filled 2022-11-02: qty 10

## 2022-11-02 MED ORDER — DIPHENHYDRAMINE HCL 25 MG PO CAPS
25.0000 mg | ORAL_CAPSULE | Freq: Once | ORAL | Status: AC
  Administered 2022-11-02: 25 mg via ORAL
  Filled 2022-11-02: qty 1

## 2022-11-02 MED ORDER — SODIUM CHLORIDE 0.9% FLUSH
10.0000 mL | INTRAVENOUS | Status: DC | PRN
  Administered 2022-11-02: 10 mL

## 2022-11-02 MED ORDER — SODIUM CHLORIDE 0.9% FLUSH
10.0000 mL | Freq: Once | INTRAVENOUS | Status: AC
  Administered 2022-11-02: 10 mL

## 2022-11-02 MED ORDER — SODIUM CHLORIDE 0.9 % IV SOLN: Freq: Once | INTRAVENOUS | Status: AC

## 2022-11-02 MED ORDER — HEPARIN SOD (PORK) LOCK FLUSH 100 UNIT/ML IV SOLN
500.0000 [IU] | Freq: Once | INTRAVENOUS | Status: AC | PRN
  Administered 2022-11-02: 500 [IU]

## 2022-11-02 MED ORDER — PROCHLORPERAZINE MALEATE 10 MG PO TABS
10.0000 mg | ORAL_TABLET | Freq: Once | ORAL | Status: AC
  Administered 2022-11-02: 10 mg via ORAL
  Filled 2022-11-02: qty 1

## 2022-11-02 NOTE — Progress Notes (Signed)
Per Dr. Lindi Adie, okay to treat with ANC 1.4

## 2022-11-02 NOTE — Assessment & Plan Note (Signed)
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 Neoadjuvant chemotherapy with Herceptin Perjeta and letrozole for 8 cycles.  (Our original recommendation is TCHP x6 cycles but patient is not willing to go through chemo) 2. left lumpectomy and ALND 09/20/2022: 0.6 cm IDC 2/14 lymph nodes, margins negative, ER 95%, PR 0%, HER2 positive 3. Followed by adjuvant radiation therapy started 11/02/2022 4.  Adjuvant antiestrogen therapy with neratinib ------------------------------------------------------------------------------------------ Current treatment: Kadcyla maintenance cycle 2 Kadcyla toxicities:   Return to clinic in 3 weeks for cycle 3

## 2022-11-02 NOTE — Patient Instructions (Signed)
[facility] ONCOLOGY  Discharge Instructions: Thank you for choosing [facility] to provide your oncology and hematology care.   If you have a lab appointment with the [facility], please go directly to the [facility] and check in at the registration area.   Wear comfortable clothing and clothing appropriate for easy access to any Portacath or PICC line.   We strive to give you quality time with your provider. You may need to reschedule your appointment if you arrive late (15 or more minutes).  Arriving late affects you and other patients whose appointments are after yours.  Also, if you miss three or more appointments without notifying the office, you may be dismissed from the clinic at the provider's discretion.      For prescription refill requests, have your pharmacy contact our office and allow 72 hours for refills to be completed.    Today you received the following chemotherapy and/or immunotherapy agents; Ado-Trastuzumab Emtansine (Kadcyla)       To help prevent nausea and vomiting after your treatment, we encourage you to take your nausea medication as directed.  BELOW ARE SYMPTOMS THAT SHOULD BE REPORTED IMMEDIATELY: *FEVER GREATER THAN 100.4 F (38 C) OR HIGHER *CHILLS OR SWEATING *NAUSEA AND VOMITING THAT IS NOT CONTROLLED WITH YOUR NAUSEA MEDICATION *UNUSUAL SHORTNESS OF BREATH *UNUSUAL BRUISING OR BLEEDING *URINARY PROBLEMS (pain or burning when urinating, or frequent urination) *BOWEL PROBLEMS (unusual diarrhea, constipation, pain near the anus) TENDERNESS IN MOUTH AND THROAT WITH OR WITHOUT PRESENCE OF ULCERS (sore throat, sores in mouth, or a toothache) UNUSUAL RASH, SWELLING OR PAIN  UNUSUAL VAGINAL DISCHARGE OR ITCHING   Items with * indicate a potential emergency and should be followed up as soon as possible or go to the Emergency Department if any problems should occur.  Please show the CHEMOTHERAPY ALERT CARD or IMMUNOTHERAPY  ALERT CARD at check-in to the Emergency Department and triage nurse.  Should you have questions after your visit or need to cancel or reschedule your appointment, please contact [facility]  Dept: [phone]  and follow the prompts.  Office hours are 8:00 a.m. to 4:30 p.m. Monday - Friday. Please note that voicemails left after 4:00 p.m. may not be returned until the following business day.  We are closed weekends and major holidays. You have access to a nurse at all times for urgent questions. Please call the main number to the clinic Dept: [phone] and follow the prompts.   For any non-urgent questions, you may also contact your provider using MyChart. We now offer e-Visits for anyone 75 and older to request care online for non-urgent symptoms. For details visit [email or web address].   Also download the MyChart app! Go to the app store, search "MyChart", open the app, select [facility], and log in with your MyChart username and password.  Masks are optional in the cancer centers. If you would like for your care team to wear a mask while they are taking care of you, please let them know. You may have one support Caitlyn Torres who is at least 59 years old accompany you for your appointments.

## 2022-11-03 ENCOUNTER — Ambulatory Visit: Payer: BC Managed Care – PPO | Admitting: Physical Therapy

## 2022-11-03 ENCOUNTER — Ambulatory Visit: Payer: BC Managed Care – PPO

## 2022-11-03 DIAGNOSIS — M25612 Stiffness of left shoulder, not elsewhere classified: Secondary | ICD-10-CM

## 2022-11-03 DIAGNOSIS — C50412 Malignant neoplasm of upper-outer quadrant of left female breast: Secondary | ICD-10-CM | POA: Diagnosis not present

## 2022-11-03 DIAGNOSIS — R293 Abnormal posture: Secondary | ICD-10-CM

## 2022-11-03 NOTE — Therapy (Signed)
OUTPATIENT PHYSICAL THERAPY BREAST CANCER POST OP FOLLOW UP   Patient Name: Caitlyn Torres MRN: [id number] DOB:[birth year], 61 y.o., female Today's Date: 11/03/2022  END OF SESSION:  PT End of Session - 11/03/22 1051     Visit Number 5    Number of Visits 12    Date for PT Re-Evaluation 11/30/22    PT Start Time 1010    PT Stop Time 6269    PT Time Calculation (min) 42 min    Activity Tolerance Patient tolerated treatment well    Behavior During Therapy WFL for tasks assessed/performed              Past Medical History:  Diagnosis Date   Anxiety    Arthritis    Cancer ([place]) 03/2022   L breast   Depression    Family history of breast cancer 05/15/2022   Family history of pancreatic cancer 05/15/2022   Family history of prostate cancer 05/15/2022   GERD (gastroesophageal reflux disease)    History of hiatal hernia    patient believes she may have been told she has this years ago   Past Surgical History:  Procedure Laterality Date   BREAST BIOPSY  09/19/2022   MM LT RADIOACTIVE SEED EA ADD LESION LOC MAMMO GUIDE 09/19/2022 [facility]   BREAST BIOPSY  09/19/2022   MM LT RADIOACTIVE SEED LOC MAMMO GUIDE 09/19/2022 [facility]MO[facility]   BREAST LUMPECTOMY WITH RADIOACTIVE SEED AND AXILLARY LYMPH NODE DISSECTION Left 09/20/2022   Procedure: LEFT BREAST SEED LUMPECTOMY, LEFT AXILLARY LYMPH NODE DISSECTION;  Surgeon: Erroll Luna, MD;  Location: [facility];  Service: General;  Laterality: Left;  GEN & PEC BLOCK   CERVIX SURGERY     Laser removal of pre-cancer cells   LASIK Bilateral    PORTACATH PLACEMENT Right 04/20/2022   Procedure: PORT PLACEMENT;  Surgeon: Erroll Luna, MD;  Location: [facility];  Service: General;  Laterality: Right;   Patient Active Problem List   Diagnosis Date Noted   Other fatigue 08/28/2022   Acute pain 08/28/2022   Genetic testing 06/01/2022   Family history of breast cancer 05/15/2022   Family history of pancreatic cancer 05/15/2022    Family history of prostate cancer 05/15/2022   Port-A-Cath in place 04/28/2022   Malignant neoplasm of upper-outer quadrant of left breast in female, estrogen receptor positive ([place]) 03/29/2022    PCP: Sidney Ace, MD  REFERRING PROVIDER: Nicholas Lose, MD  REFERRING DIAG: Left Breast Cancer  THERAPY DIAG:  Stiffness of L shoulder Stiffness of left shoulder, not elsewhere classified  Abnormal posture  Malignant neoplasm of upper-outer quadrant of left female breast, unspecified estrogen receptor status ([place])  Rationale for Evaluation and Treatment: Rehabilitation  ONSET DATE: 03/14/2022  SUBJECTIVE:  SUBJECTIVE STATEMENT:  Pt says she had a Kadcyla treatment yesterday.She will have them every 3 weeks til June. She starts radiation next week  She did not sleep well last night.  She says her arm is doing well and only hurts when she tried to bring it out and up to the side. She got some more compression bras and is getting a sleeve   PERTINENT HISTORY:  03/14/2022:Screening detected left breast masses 1.5 cm and 1.3 cm with enlarged left axillary lymph node, biopsy revealed grade 3 IDC with lymph node being positive, ER 95%, PR 0%, HER2 2+ by IHC and FISH positive with a ratio 2.45 and a copy #4.9, the second biopsy was HER2 negative with a ratio  1. Neoadjuvant chemotherapy with Herceptin Perjeta and letrozole for 8 cycles.  (Our original recommendation is TCHP x6 cycles but patient is not willing to go through chemo) 2. left lumpectomy and ALND 09/20/2022: 0.6 cm IDC 2/14 lymph nodes, margins negative, ER 95%, PR 0%, HER2 positive 3. Followed by adjuvant radiation therapy  4.  Adjuvant antiestrogen therapy with neratinib She had left lumpectomy on 09/20/2022 with 2/14 LN's  PATIENT GOALS:  Reassess how  my recovery is going related to arm function, pain, and swelling.  PAIN:  Are you having pain? None presently 0/10 Aggravating:at rest for some reason Makes better; stretching  PRECAUTIONS: Recent Surgery, left UE Lymphedema risk,   ACTIVITY LEVEL / LEISURE: not yet,   OBJECTIVE:   PATIENT SURVEYS:   11/03/2022:   30 second sit to stand 12 reps with 1/10 RPE  pt felt she could do more  QUICK DASH: 34.09  OBSERVATIONS: Incisions healed with glue still present both incisions. Mild firmness under breast incision and foam pad made to place over it in bra. Several cords visible and palpable in axillary region and running to medial elbow.  POSTURE:  Forward head rounded shoulders  LYMPHEDEMA ASSESSMENT:   UPPER EXTREMITY AROM/PROM:   A/PROM RIGHT   eval    Shoulder extension 47  Shoulder flexion 163  Shoulder abduction 180  Shoulder internal rotation 67  Shoulder external rotation 107                          (Blank rows = not tested)   A/PROM LEFT   eval LEFT 10/19/2022 LEFT 10/24/22 LEFT 10/31/2022 LEFT  11/03/2022  Shoulder extension 60 40 68    Shoulder flexion 165 142 147 162 165  Shoulder abduction 180 74 114 143 163  Shoulder internal rotation 57      Shoulder external rotation 107                              (Blank rows = not tested)     CERVICAL AROM: All within normal limits:          UPPER EXTREMITY STRENGTH: WNL right, Left NT due to sx     LYMPHEDEMA ASSESSMENTS:    LANDMARK RIGHT   Eval 03/31/2022 RIGHT 10/19/2022  10 cm proximal to olecranon process 24.0 22.7  Olecranon process 22.3 21.6  10 cm proximal to ulnar styloid process 20.9 20.4  Just proximal to ulnar styloid process 14.6 14.35  Across hand at thumb web space 19.3 19.0  At base of 2nd digit 5.9 5.6  (Blank rows = not tested)   [facility] LEFT   Eval 03/31/2022 Left 10/19/2022  10 cm proximal  to olecranon process 23.6 22.8  Olecranon process 21.6 21.6  10 cm proximal to  ulnar styloid process 19.5 18.9  Just proximal to ulnar styloid process 14.15 14.5  Across hand at thumb web space 18.45 18.0  At base of 2nd digit 5.7 5.4  (Blank rows = not tested)      Surgery type/Date: Left lumpectomy with SLNB Number of lymph nodes removed: 2/14 Current/past treatment (chemo, radiation, hormone therapy): Herceptin, radiation pending, anti estrogens Other symptoms:  Heaviness/tightness Yes Pain Yes Pitting edema No Infections No Decreased scar mobility No Stemmer sign No  TREATMENT PERFORMED TODAY: 11/03/2022 Sitting: scapular and upper thoracic ROM, Pt noting that she feels tight with thoracic rotation.  Pulley exercises for shoulder flexion, abduction and scaption with cues to feel stretch down into sidebody.  To supine for PROM to left shoulder into external rotation, flexion and aduction with good stretch  and empty end feel. Pt with some stretch in axilla  with cord visible occasional pull into  upper arm. To sidelying for active abduction, flexion and small circles with hand pointed to ceiling and backward thoracic rotation.To supine for lower trunk rotation and bridges.  Sit to stand in 30 sec. Test for 12 reps.   10/31/2022 14 min late STM to left pectorals, UT and lats in supine with cocoa butter Supine wand x 4 flexion and scaption PROM left shoulder flexion, scaption, abduction MFR techniques with longitudinal stretch and S technique to decrease cording NTS with left arm in different ranges of abduction     10/26/2922  Pulleys x 2 min in direction of abduction and then x 2 min in direction of flexion and scaption  PROM in to flexion, scaption, and abduction to pt's tolerance  MFR to cording extending from L axilla down upper forearm and down towards lumpectomy scar   STM to left pectorals, UT and lats in supine and to medial upper arm and forearm after STM Small TG soft cut for left UE to aid discomfort Pt given script to get compression  sleeve and gauntlet       10/24/22:  Pulleys x 2 min in direction of abduction and then x 2 min in direction of flexion with pt returning therapist demo  Ball up wall x 10 reps in direction of flexion with pt returning demo  Ball up wall x 10 reps in to abduction on L side with pt returning demo  PROM in to flexion and abduction to pt's tolerance  MFR to cording extending from L axilla down upper forearm and down towards lumpectomy scar with at least 4 cords palpable and visible  Gentle STM to area surrounding lumpectomy scar that is fibrotic   PATIENT EDUCATION:  Education details: supine flexion and stargazer, standing wall slide, sitting or standing scapular retraction. Person educated: Patient Education method: Explanation, Demonstration, and Handouts Education comprehension: verbalized understanding and returned demonstration  HOME EXERCISE PROGRAM: Reviewed previously given post op HEP. Practiced each x 5-7 reps and gave pt new handout secondary to misplacing hers.  ASSESSMENT:  CLINICAL IMPRESSION: Pt continues with good improvement in shoulder ROM and is ready to progress to UE  strengthening next session.  She has fatigue with current chemo so would benefit from general strengthening as well to help tolerate this chemo and radiation.  Baseline of sit to stand of 12 reps today.  Pt will benefit from skilled therapeutic intervention to improve on the following deficits: Decreased knowledge of precautions, impaired UE functional use, pain, decreased  ROM, postural dysfunction.   PT treatment/interventions: ADL/Self care home management, Therapeutic exercises, Therapeutic activity, Neuromuscular re-education, Patient/Family education, Self Care, Orthotic/Fit training, scar mobilization, Manual therapy, and Re-evaluation   GOALS: Goals reviewed with patient? Yes  LONG TERM GOALS:  (STG=LTG)  GOALS Name Target Date  Goal status  1 Pt will demonstrate she has regained full  shoulder ROM and function post operatively compared to baselines.  Baseline: 11/30/2022 MET  2 Pt will have quick dash no greater than 15% to demonstrate improved function 11/30/2022 INITIAL  3 Pt will have improved pain by 50% or greater 11/30/2022 INITIAL  4 Pt will be fit for a compression sleeve/gauntlet and will be independent in donning and doffing 11/30/2022 INITIAL     PLAN:  PT FREQUENCY/DURATION: 2X/week x 6 weeks  PLAN FOR NEXT SESSION: Progress to strengthening with supine scapular series.Continue pulleys, ball, Print follow up info,STM left UQ, AAROM, PROM, MFR to cording, scar mobs ,  update HEP prn, sign up for ABC in Jan.   [facility]  [address], [address]  [place] [address]  [phone]  After Breast Cancer Class It is recommended you attend the ABC class to be educated on lymphedema risk reduction. This class is free of charge and lasts for 1 hour. It is a 1-time class. You will need to download the Webex app either on your phone or computer. We will send you a link the night before or the morning of the class. You should be able to click on that link to join the class. This is not a confidential class. You don't have to turn your camera on, but other participants may be able to see your email address.  Scar massage You can begin gentle scar massage to you incision sites. Gently place one hand on the incision and move the skin (without sliding on the skin) in various directions. Do this for a few minutes and then you can gently massage either coconut oil or vitamin E cream into the scars.  Compression garment You should continue wearing your compression bra until you feel like you no longer have swelling.  Home exercise Program Continue doing the exercises you were given until you feel like you can do them without feeling any tightness at the end.   Walking Program Studies show that 30 minutes of walking per day (fast enough to elevate your  heart rate) can significantly reduce the risk of a cancer recurrence. If you can't walk due to other medical reasons, we encourage you to find another activity you could do (like a stationary bike or water exercise).  Posture After breast cancer surgery, people frequently sit with rounded shoulders posture because it puts their incisions on slack and feels better. If you sit like this and scar tissue forms in that position, you can become very tight and have pain sitting or standing with good posture. Try to be aware of your posture and sit and stand up tall to heal properly.  Follow up PT: It is recommended you return every 3 months for the first 3 years following surgery to be assessed on the SOZO machine for an L-Dex score. This helps prevent clinically significant lymphedema in 95% of patients. These follow up screens are 10 minute appointments that you are not billed for.  Norwood Levo, PT 11/03/2022, 11:02 AM

## 2022-11-07 ENCOUNTER — Ambulatory Visit
Admission: RE | Admit: 2022-11-07 | Discharge: 2022-11-07 | Disposition: A | Discharge status: Home / Self Care | Payer: BC Managed Care – PPO | Source: Ambulatory Visit | Attending: Radiation Oncology | Admitting: Radiation Oncology

## 2022-11-07 ENCOUNTER — Other Ambulatory Visit: Payer: Self-pay

## 2022-11-07 ENCOUNTER — Ambulatory Visit: Payer: BC Managed Care – PPO

## 2022-11-07 DIAGNOSIS — R293 Abnormal posture: Secondary | ICD-10-CM

## 2022-11-07 DIAGNOSIS — C50412 Malignant neoplasm of upper-outer quadrant of left female breast: Secondary | ICD-10-CM | POA: Diagnosis not present

## 2022-11-07 DIAGNOSIS — M25612 Stiffness of left shoulder, not elsewhere classified: Secondary | ICD-10-CM

## 2022-11-07 LAB — RAD ONC ARIA SESSION SUMMARY

## 2022-11-07 NOTE — Therapy (Signed)
OUTPATIENT PHYSICAL THERAPY BREAST CANCER POST OP FOLLOW UP   Patient Name: Caitlyn Torres MRN: [id number] DOB:[birth year], 61 y.o., female Today's Date: 11/07/2022  END OF SESSION:  PT End of Session - 11/07/22 0904     Visit Number 6    Number of Visits 12    Date for PT Re-Evaluation 11/30/22    PT Start Time 0905    PT Stop Time 0954    PT Time Calculation (min) 49 min    Activity Tolerance Patient tolerated treatment well    Behavior During Therapy [facility] for tasks assessed/performed              Past Medical History:  Diagnosis Date   Anxiety    Arthritis    Cancer ([place]) 03/2022   L breast   Depression    Family history of breast cancer 05/15/2022   Family history of pancreatic cancer 05/15/2022   Family history of prostate cancer 05/15/2022   GERD (gastroesophageal reflux disease)    History of hiatal hernia    patient believes she may have been told she has this years ago   Past Surgical History:  Procedure Laterality Date   BREAST BIOPSY  09/19/2022   MM LT RADIOACTIVE SEED EA ADD LESION LOC MAMMO GUIDE 09/19/2022 [facility] MAMMOGRAPHY   BREAST BIOPSY  09/19/2022   MM LT RADIOACTIVE SEED LOC MAMMO GUIDE 09/19/2022 [facility] MAMMOGRAPHY   BREAST LUMPECTOMY WITH RADIOACTIVE SEED AND AXILLARY LYMPH NODE DISSECTION Left 09/20/2022   Procedure: LEFT BREAST SEED LUMPECTOMY, LEFT AXILLARY LYMPH NODE DISSECTION;  Surgeon: Erroll Luna, MD;  Location: [place];  Service: General;  Laterality: Left;  GEN & PEC BLOCK   CERVIX SURGERY     Laser removal of pre-cancer cells   LASIK Bilateral    PORTACATH PLACEMENT Right 04/20/2022   Procedure: PORT PLACEMENT;  Surgeon: Erroll Luna, MD;  Location: [place];  Service: General;  Laterality: Right;   Patient Active Problem List   Diagnosis Date Noted   Other fatigue 08/28/2022   Acute pain 08/28/2022   Genetic testing 06/01/2022   Family history of breast cancer 05/15/2022   Family history of pancreatic cancer 05/15/2022    Family history of prostate cancer 05/15/2022   Port-A-Cath in place 04/28/2022   Malignant neoplasm of upper-outer quadrant of left breast in female, estrogen receptor positive ([place]) 03/29/2022    PCP: Sidney Ace, MD  REFERRING PROVIDER: Nicholas Lose, MD  REFERRING DIAG: Left Breast Cancer  THERAPY DIAG:  Stiffness of L shoulder Stiffness of left shoulder, not elsewhere classified  Abnormal posture  Malignant neoplasm of upper-outer quadrant of left female breast, unspecified estrogen receptor status ([place])  Rationale for Evaluation and Treatment: Rehabilitation  ONSET DATE: 03/14/2022  SUBJECTIVE:  SUBJECTIVE STATEMENT:   I go to get my sleeve today and I start radiation today at 3:00. Still feel the cording when I reach to the side.   PERTINENT HISTORY:  03/14/2022:Screening detected left breast masses 1.5 cm and 1.3 cm with enlarged left axillary lymph node, biopsy revealed grade 3 IDC with lymph node being positive, ER 95%, PR 0%, HER2 2+ by IHC and FISH positive with a ratio 2.45 and a copy #4.9, the second biopsy was HER2 negative with a ratio  1. Neoadjuvant chemotherapy with Herceptin Perjeta and letrozole for 8 cycles.  (Our original recommendation is TCHP x6 cycles but patient is not willing to go through chemo) 2. left lumpectomy and ALND 09/20/2022: 0.6 cm IDC 2/14 lymph nodes, margins negative, ER 95%, PR 0%, HER2 positive 3. Followed by adjuvant radiation therapy  4.  Adjuvant antiestrogen therapy with neratinib She had left lumpectomy on 09/20/2022 with 2/14 LN's  PATIENT GOALS:  Reassess how my recovery is going related to arm function, pain, and swelling.  PAIN:  Are you having pain? None presently 0/10, but when I reach into abduction it increases to 3-4/10/ Aggravating:at rest  for some reason Makes better; stretching  PRECAUTIONS: Recent Surgery, left UE Lymphedema risk,   ACTIVITY LEVEL / LEISURE: not yet,   OBJECTIVE:   PATIENT SURVEYS:   11/03/2022:   30 second sit to stand 12 reps with 1/10 RPE  pt felt she could do more  QUICK DASH: 34.09  OBSERVATIONS: Incisions healed with glue still present both incisions. Mild firmness under breast incision and foam pad made to place over it in bra. Several cords visible and palpable in axillary region and running to medial elbow.  POSTURE:  Forward head rounded shoulders  LYMPHEDEMA ASSESSMENT:   UPPER EXTREMITY AROM/PROM:   A/PROM RIGHT   eval    Shoulder extension 47  Shoulder flexion 163  Shoulder abduction 180  Shoulder internal rotation 67  Shoulder external rotation 107                          (Blank rows = not tested)   A/PROM LEFT   eval LEFT 10/19/2022 LEFT 10/24/22 LEFT 10/31/2022 LEFT  11/03/2022  Shoulder extension 60 40 68    Shoulder flexion 165 142 147 162 165  Shoulder abduction 180 74 114 143 163  Shoulder internal rotation 57      Shoulder external rotation 107                              (Blank rows = not tested)     CERVICAL AROM: All within normal limits:          UPPER EXTREMITY STRENGTH: WNL right, Left NT due to sx     LYMPHEDEMA ASSESSMENTS:    LANDMARK RIGHT   Eval 03/31/2022 RIGHT 10/19/2022  10 cm proximal to olecranon process 24.0 22.7  Olecranon process 22.3 21.6  10 cm proximal to ulnar styloid process 20.9 20.4  Just proximal to ulnar styloid process 14.6 14.35  Across hand at thumb web space 19.3 19.0  At base of 2nd digit 5.9 5.6  (Blank rows = not tested)   [facility] LEFT   Eval 03/31/2022 Left 10/19/2022  10 cm proximal to olecranon process 23.6 22.8  Olecranon process 21.6 21.6  10 cm proximal to ulnar styloid process 19.5 18.9  Just proximal to ulnar styloid process 14.15  14.5  Across hand at thumb web space 18.45 18.0  At base of 2nd  digit 5.7 5.4  (Blank rows = not tested)      Surgery type/Date: Left lumpectomy with SLNB Number of lymph nodes removed: 2/14 Current/past treatment (chemo, radiation, hormone therapy): Herceptin, radiation pending, anti estrogens Other symptoms:  Heaviness/tightness Yes Pain Yes Pitting edema No Infections No Decreased scar mobility No Stemmer sign No  TREATMENT PERFORMED TODAY:   11/07/2022  Overhead pulleys for shoulder flexion and abducton x 2 min Supine wand flexion and scaption x 4,  PROM left shoulder flexion, scaption, abduction MFR techniques with longitudinal stretch and S technique to decrease cording Left upper extremity Supine scapular series with yellow band x 5 ea Tried LTR but held as pt did not feel stretching with this today. Updated HEP and gave yellow band   11/03/2022 Sitting: scapular and upper thoracic ROM, Pt noting that she feels tight with thoracic rotation.  Pulley exercises for shoulder flexion, abduction and scaption with cues to feel stretch down into sidebody.  To supine for PROM to left shoulder into external rotation, flexion and aduction with good stretch  and empty end feel. Pt with some stretch in axilla  with cord visible occasional pull into  upper arm. To sidelying for active abduction, flexion and small circles with hand pointed to ceiling and backward thoracic rotation.To supine for lower trunk rotation and bridges.  Sit to stand in 30 sec. Test for 12 reps.   10/31/2022 14 min late STM to left pectorals, UT and lats in supine with cocoa butter Supine wand x 4 flexion and scaption PROM left shoulder flexion, scaption, abduction MFR techniques with longitudinal stretch and S technique to decrease cording NTS with left arm in different ranges of abduction     10/26/2922  Pulleys x 2 min in direction of abduction and then x 2 min in direction of flexion and scaption  PROM in to flexion, scaption, and abduction to pt's  tolerance  MFR to cording extending from L axilla down upper forearm and down towards lumpectomy scar   STM to left pectorals, UT and lats in supine and to medial upper arm and forearm after STM Small TG soft cut for left UE to aid discomfort Pt given script to get compression sleeve and gauntlet       10/24/22:  Pulleys x 2 min in direction of abduction and then x 2 min in direction of flexion with pt returning therapist demo  Ball up wall x 10 reps in direction of flexion with pt returning demo  Ball up wall x 10 reps in to abduction on L side with pt returning demo  PROM in to flexion and abduction to pt's tolerance  MFR to cording extending from L axilla down upper forearm and down towards lumpectomy scar with at least 4 cords palpable and visible  Gentle STM to area surrounding lumpectomy scar that is fibrotic   PATIENT EDUCATION:  Education details: supine flexion and stargazer, standing wall slide, sitting or standing scapular retraction. Person educated: Patient Education method: Explanation, Demonstration, and Handouts Education comprehension: verbalized understanding and returned demonstration  HOME EXERCISE PROGRAM: Reviewed previously given post op HEP. Practiced each x 5-7 reps and gave pt new handout secondary to misplacing hers.  ASSESSMENT:  CLINICAL IMPRESSION: Pt continues to be bothered by left UE cording but does feel looser after todays session. She did well with supine scapular series but was advised to count reps and do  only every other day. She will continue to work on NTS at home.   Pt will benefit from skilled therapeutic intervention to improve on the following deficits: Decreased knowledge of precautions, impaired UE functional use, pain, decreased ROM, postural dysfunction.   PT treatment/interventions: ADL/Self care home management, Therapeutic exercises, Therapeutic activity, Neuromuscular re-education, Patient/Family education, Self Care,  Orthotic/Fit training, scar mobilization, Manual therapy, and Re-evaluation   GOALS: Goals reviewed with patient? Yes  LONG TERM GOALS:  (STG=LTG)  GOALS Name Target Date  Goal status  1 Pt will demonstrate she has regained full shoulder ROM and function post operatively compared to baselines.  Baseline: 11/30/2022 MET  2 Pt will have quick dash no greater than 15% to demonstrate improved function 11/30/2022 INITIAL  3 Pt will have improved pain by 50% or greater 11/30/2022 INITIAL  4 Pt will be fit for a compression sleeve/gauntlet and will be independent in donning and doffing 11/30/2022 INITIAL     PLAN:  PT FREQUENCY/DURATION: 2X/week x 6 weeks  PLAN FOR NEXT SESSION: Progress to strengthening with supine scapular series.Continue pulleys, ball, Print follow up info,STM left UQ, AAROM, PROM, MFR to cording, scar mobs ,  update HEP prn, sign up for ABC in Jan.   [place]  [address], [address]   [place] [address]  [phone]  After Breast Cancer Class It is recommended you attend the ABC class to be educated on lymphedema risk reduction. This class is free of charge and lasts for 1 hour. It is a 1-time class. You will need to download the Webex app either on your phone or computer. We will send you a link the night before or the morning of the class. You should be able to click on that link to join the class. This is not a confidential class. You don't have to turn your camera on, but other participants may be able to see your email address.  Scar massage You can begin gentle scar massage to you incision sites. Gently place one hand on the incision and move the skin (without sliding on the skin) in various directions. Do this for a few minutes and then you can gently massage either coconut oil or vitamin E cream into the scars.  Compression garment You should continue wearing your compression bra until you feel like you no longer have  swelling.  Home exercise Program Continue doing the exercises you were given until you feel like you can do them without feeling any tightness at the end.   Walking Program Studies show that 30 minutes of walking per day (fast enough to elevate your heart rate) can significantly reduce the risk of a cancer recurrence. If you can't walk due to other medical reasons, we encourage you to find another activity you could do (like a stationary bike or water exercise).  Posture After breast cancer surgery, people frequently sit with rounded shoulders posture because it puts their incisions on slack and feels better. If you sit like this and scar tissue forms in that position, you can become very tight and have pain sitting or standing with good posture. Try to be aware of your posture and sit and stand up tall to heal properly.  Follow up PT: It is recommended you return every 3 months for the first 3 years following surgery to be assessed on the SOZO machine for an L-Dex score. This helps prevent clinically significant lymphedema in 95% of patients. These follow up screens are 10 minute  appointments that you are not billed for.  Claris Pong, PT 11/07/2022, 9:57 AM

## 2022-11-07 NOTE — Patient Instructions (Signed)

## 2022-11-08 ENCOUNTER — Ambulatory Visit
Admission: RE | Admit: 2022-11-08 | Discharge: 2022-11-08 | Disposition: A | Discharge status: Home / Self Care | Payer: BC Managed Care – PPO | Source: Ambulatory Visit | Attending: Radiation Oncology | Admitting: Radiation Oncology

## 2022-11-08 ENCOUNTER — Other Ambulatory Visit: Payer: Self-pay

## 2022-11-08 DIAGNOSIS — C50412 Malignant neoplasm of upper-outer quadrant of left female breast: Secondary | ICD-10-CM | POA: Diagnosis not present

## 2022-11-08 LAB — SIGNATERA ONLY (NATERA MANAGED)
SIGNATERA MTM READOUT: 0 MTM/ml
SIGNATERA TEST RESULT: NEGATIVE

## 2022-11-08 LAB — RAD ONC ARIA SESSION SUMMARY

## 2022-11-09 ENCOUNTER — Telehealth: Payer: Self-pay

## 2022-11-09 ENCOUNTER — Ambulatory Visit: Payer: BC Managed Care – PPO

## 2022-11-09 ENCOUNTER — Ambulatory Visit
Admission: RE | Admit: 2022-11-09 | Discharge: 2022-11-09 | Disposition: A | Discharge status: Home / Self Care | Payer: BC Managed Care – PPO | Source: Ambulatory Visit | Attending: Radiation Oncology | Admitting: Radiation Oncology

## 2022-11-09 ENCOUNTER — Other Ambulatory Visit: Payer: Self-pay

## 2022-11-09 DIAGNOSIS — R293 Abnormal posture: Secondary | ICD-10-CM

## 2022-11-09 DIAGNOSIS — C50412 Malignant neoplasm of upper-outer quadrant of left female breast: Secondary | ICD-10-CM | POA: Diagnosis not present

## 2022-11-09 DIAGNOSIS — M25612 Stiffness of left shoulder, not elsewhere classified: Secondary | ICD-10-CM

## 2022-11-09 LAB — RAD ONC ARIA SESSION SUMMARY

## 2022-11-09 NOTE — Therapy (Signed)
OUTPATIENT PHYSICAL THERAPY BREAST CANCER TREATMENT   Patient Name: Caitlyn Torres MRN: [id number] DOB:[birth year], 61 y.o., female Today's Date: 11/09/2022  END OF SESSION:  PT End of Session - 11/09/22 1012     Visit Number 7    Number of Visits 12    Date for PT Re-Evaluation 11/30/22    PT Start Time 1009   pt arrived late   PT Stop Time 1111    PT Time Calculation (min) 62 min    Activity Tolerance Patient tolerated treatment well    Behavior During Therapy [facility] for tasks assessed/performed              Past Medical History:  Diagnosis Date   Anxiety    Arthritis    Cancer ([place]) 03/2022   L breast   Depression    Family history of breast cancer 05/15/2022   Family history of pancreatic cancer 05/15/2022   Family history of prostate cancer 05/15/2022   GERD (gastroesophageal reflux disease)    History of hiatal hernia    patient believes she may have been told she has this years ago   Past Surgical History:  Procedure Laterality Date   BREAST BIOPSY  09/19/2022   MM LT RADIOACTIVE SEED EA ADD LESION LOC MAMMO GUIDE 09/19/2022 [facility]MOGRAPHY   BREAST BIOPSY  09/19/2022   MM LT RADIOACTIVE SEED LOC MAMMO GUIDE 09/19/2022 [facility] MAMMOGRAPHY   BREAST LUMPECTOMY WITH RADIOACTIVE SEED AND AXILLARY LYMPH NODE DISSECTION Left 09/20/2022   Procedure: LEFT BREAST SEED LUMPECTOMY, LEFT AXILLARY LYMPH NODE DISSECTION;  Surgeon: Erroll Luna, MD;  Location: [place];  Service: General;  Laterality: Left;  GEN & PEC BLOCK   CERVIX SURGERY     Laser removal of pre-cancer cells   LASIK Bilateral    PORTACATH PLACEMENT Right 04/20/2022   Procedure: PORT PLACEMENT;  Surgeon: Erroll Luna, MD;  Location: [place];  Service: General;  Laterality: Right;   Patient Active Problem List   Diagnosis Date Noted   Other fatigue 08/28/2022   Acute pain 08/28/2022   Genetic testing 06/01/2022   Family history of breast cancer 05/15/2022   Family history of pancreatic cancer  05/15/2022   Family history of prostate cancer 05/15/2022   Port-A-Cath in place 04/28/2022   Malignant neoplasm of upper-outer quadrant of left breast in female, estrogen receptor positive ([place]) 03/29/2022    PCP: Sidney Ace, MD  REFERRING PROVIDER: Nicholas Lose, MD  REFERRING DIAG: Left Breast Cancer  THERAPY DIAG:  Stiffness of L shoulder Stiffness of left shoulder, not elsewhere classified  Abnormal posture  Malignant neoplasm of upper-outer quadrant of left female breast, unspecified estrogen receptor status ([place])  Rationale for Evaluation and Treatment: Rehabilitation  ONSET DATE: 03/14/2022  SUBJECTIVE:  SUBJECTIVE STATEMENT:   Radiation seems to be going fie so far. I did get measured for my compression sleeve but she didn't have my size so she's ordering that for me. I can tell the cord is still there but it feels so much better since I started physical therapy. I didn't get to try the new HEP yet just because I've been so busy!    PERTINENT HISTORY:  03/14/2022:Screening detected left breast masses 1.5 cm and 1.3 cm with enlarged left axillary lymph node, biopsy revealed grade 3 IDC with lymph node being positive, ER 95%, PR 0%, HER2 2+ by IHC and FISH positive with a ratio 2.45 and a copy #4.9, the second biopsy was HER2 negative with a ratio  1. Neoadjuvant chemotherapy with Herceptin Perjeta and letrozole for 8 cycles.  (Our original recommendation is TCHP x6 cycles but patient is not willing to go through chemo) 2. left lumpectomy and ALND 09/20/2022: 0.6 cm IDC 2/14 lymph nodes, margins negative, ER 95%, PR 0%, HER2 positive 3. Followed by adjuvant radiation therapy  4.  Adjuvant antiestrogen therapy with neratinib She had left lumpectomy on 09/20/2022 with 2/14 LN's  PATIENT GOALS:   Reassess how my recovery is going related to arm function, pain, and swelling.  PAIN:  Are you having pain? 0/10  PRECAUTIONS: Recent Surgery, left UE Lymphedema risk,   ACTIVITY LEVEL / LEISURE: not yet,   OBJECTIVE:   PATIENT SURVEYS:   11/03/2022:   30 second sit to stand 12 reps with 1/10 RPE  pt felt she could do more  QUICK DASH: 34.09  OBSERVATIONS: Incisions healed with glue still present both incisions. Mild firmness under breast incision and foam pad made to place over it in bra. Several cords visible and palpable in axillary region and running to medial elbow.  POSTURE:  Forward head rounded shoulders  LYMPHEDEMA ASSESSMENT:   UPPER EXTREMITY AROM/PROM:   A/PROM RIGHT   eval    Shoulder extension 47  Shoulder flexion 163  Shoulder abduction 180  Shoulder internal rotation 67  Shoulder external rotation 107                          (Blank rows = not tested)   A/PROM LEFT   eval LEFT 10/19/2022 LEFT 10/24/22 LEFT 10/31/2022 LEFT  11/03/2022  Shoulder extension 60 40 68    Shoulder flexion 165 142 147 162 165  Shoulder abduction 180 74 114 143 163  Shoulder internal rotation 57      Shoulder external rotation 107                              (Blank rows = not tested)     CERVICAL AROM: All within normal limits:          UPPER EXTREMITY STRENGTH: WNL right, Left NT due to sx     LYMPHEDEMA ASSESSMENTS:    LANDMARK RIGHT   Eval 03/31/2022 RIGHT 10/19/2022  10 cm proximal to olecranon process 24.0 22.7  Olecranon process 22.3 21.6  10 cm proximal to ulnar styloid process 20.9 20.4  Just proximal to ulnar styloid process 14.6 14.35  Across hand at thumb web space 19.3 19.0  At base of 2nd digit 5.9 5.6  (Blank rows = not tested)   [facility] LEFT   Eval 03/31/2022 Left 10/19/2022  10 cm proximal to olecranon process 23.6 22.8  Olecranon process 21.6  21.6  10 cm proximal to ulnar styloid process 19.5 18.9  Just proximal to ulnar styloid  process 14.15 14.5  Across hand at thumb web space 18.45 18.0  At base of 2nd digit 5.7 5.4  (Blank rows = not tested)      Surgery type/Date: Left lumpectomy with SLNB Number of lymph nodes removed: 2/14 Current/past treatment (chemo, radiation, hormone therapy): Herceptin, radiation pending, anti estrogens Other symptoms:  Heaviness/tightness Yes Pain Yes Pitting edema No Infections No Decreased scar mobility No Stemmer sign No  TREATMENT PERFORMED TODAY:  11/09/22: Therapeutic Exercises Overhead pulleys for shoulder flexion and abducton x 2 min Ball roll up wall into flexion and Lt UE abduction x10 each returning therapist demo Modified downward dog on wall 5x, 5 sec holds returning therapist demo Supine over half roll for bil horz abd and then bil UE "V" into scaption x10 each  Reviewed supine scapular series with yellow theraband x5 each, mild pain reported with narrow grip flexion so encouraged pt to limit ROM to stopping before pain Manual Therapy PROM left shoulder flexion, abduction, and D2 with scapular depression throughout MFR: To Lt axilla and into medial upper arm to antecubital fossa along cord  11/07/2022  Overhead pulleys for shoulder flexion and abducton x 2 min Supine wand flexion and scaption x 4,  PROM left shoulder flexion, scaption, abduction MFR techniques with longitudinal stretch and S technique to decrease cording Left upper extremity Supine scapular series with yellow band x 5 ea Tried LTR but held as pt did not feel stretching with this today. Updated HEP and gave yellow band   11/03/2022 Sitting: scapular and upper thoracic ROM, Pt noting that she feels tight with thoracic rotation.  Pulley exercises for shoulder flexion, abduction and scaption with cues to feel stretch down into sidebody.  To supine for PROM to left shoulder into external rotation, flexion and aduction with good stretch  and empty end feel. Pt with some stretch in axilla  with  cord visible occasional pull into  upper arm. To sidelying for active abduction, flexion and small circles with hand pointed to ceiling and backward thoracic rotation.To supine for lower trunk rotation and bridges.  Sit to stand in 30 sec. Test for 12 reps.       PATIENT EDUCATION:  Education details: supine flexion and stargazer, standing wall slide, sitting or standing scapular retraction. Person educated: Patient Education method: Explanation, Demonstration, and Handouts Education comprehension: verbalized understanding and returned demonstration  HOME EXERCISE PROGRAM: Reviewed previously given post op HEP. Practiced each x 5-7 reps and gave pt new handout secondary to misplacing hers.  ASSESSMENT:  CLINICAL IMPRESSION: Pt reports that overall her cording is still present but feels much better since starting physical therapy. Progressed A/ & AA/ROM stretching which pt tolerated well. Then continued with manual therapy working to further decrease cording and improve her end P/ROM.    Pt will benefit from skilled therapeutic intervention to improve on the following deficits: Decreased knowledge of precautions, impaired UE functional use, pain, decreased ROM, postural dysfunction.   PT treatment/interventions: ADL/Self care home management, Therapeutic exercises, Therapeutic activity, Neuromuscular re-education, Patient/Family education, Self Care, Orthotic/Fit training, scar mobilization, Manual therapy, and Re-evaluation   GOALS: Goals reviewed with patient? Yes  LONG TERM GOALS:  (STG=LTG)  GOALS Name Target Date  Goal status  1 Pt will demonstrate she has regained full shoulder ROM and function post operatively compared to baselines.  Baseline: 11/30/2022 MET  2 Pt will have quick  dash no greater than 15% to demonstrate improved function 11/30/2022 INITIAL  3 Pt will have improved pain by 50% or greater 11/30/2022 INITIAL  4 Pt will be fit for a compression sleeve/gauntlet and  will be independent in donning and doffing 11/30/2022 INITIAL     PLAN:  PT FREQUENCY/DURATION: 2X/week x 6 weeks  PLAN FOR NEXT SESSION: Review supine scapular series prn.Continue pulleys, ball, STM left UQ, AAROM, PROM, MFR to cording, scar mobs ,  update HEP prn    [facility]  [address] [place] [address]  [phone]   Collie Siad, PTA 11/09/22 11:21 AM

## 2022-11-09 NOTE — Telephone Encounter (Signed)
Called pt per MD to advise [employer] testing was negative/not detected. Pt verbalized understanding of results and knows [facility] will be in touch to schedule 3 mo repeat lab.   

## 2022-11-10 ENCOUNTER — Other Ambulatory Visit: Payer: Self-pay

## 2022-11-10 ENCOUNTER — Ambulatory Visit
Admission: RE | Admit: 2022-11-10 | Discharge: 2022-11-10 | Disposition: A | Discharge status: Home / Self Care | Payer: BC Managed Care – PPO | Source: Ambulatory Visit | Attending: Radiation Oncology | Admitting: Radiation Oncology

## 2022-11-10 DIAGNOSIS — C50412 Malignant neoplasm of upper-outer quadrant of left female breast: Secondary | ICD-10-CM | POA: Diagnosis not present

## 2022-11-10 DIAGNOSIS — Z170 Estrogen receptor positive status [ER+]: Secondary | ICD-10-CM

## 2022-11-10 LAB — RAD ONC ARIA SESSION SUMMARY

## 2022-11-10 MED ORDER — ALRA NON-METALLIC DEODORANT (RAD-ONC)
1.0000 | Freq: Once | TOPICAL | Status: AC
  Administered 2022-11-10: 1 via TOPICAL

## 2022-11-10 MED ORDER — RADIAPLEXRX EX GEL: Freq: Once | CUTANEOUS | Status: AC

## 2022-11-14 ENCOUNTER — Encounter: Payer: Self-pay | Admitting: Physical Therapy

## 2022-11-14 ENCOUNTER — Ambulatory Visit
Admission: RE | Admit: 2022-11-14 | Discharge: 2022-11-14 | Disposition: A | Discharge status: Home / Self Care | Payer: BC Managed Care – PPO | Source: Ambulatory Visit | Attending: Radiation Oncology | Admitting: Radiation Oncology

## 2022-11-14 ENCOUNTER — Other Ambulatory Visit: Payer: Self-pay

## 2022-11-14 ENCOUNTER — Ambulatory Visit: Payer: BC Managed Care – PPO | Attending: Surgery | Admitting: Physical Therapy

## 2022-11-14 DIAGNOSIS — R293 Abnormal posture: Secondary | ICD-10-CM | POA: Insufficient documentation

## 2022-11-14 DIAGNOSIS — Z510 Encounter for antineoplastic radiation therapy: Secondary | ICD-10-CM | POA: Insufficient documentation

## 2022-11-14 DIAGNOSIS — C50412 Malignant neoplasm of upper-outer quadrant of left female breast: Secondary | ICD-10-CM | POA: Insufficient documentation

## 2022-11-14 DIAGNOSIS — Z5112 Encounter for antineoplastic immunotherapy: Secondary | ICD-10-CM | POA: Insufficient documentation

## 2022-11-14 DIAGNOSIS — Z79899 Other long term (current) drug therapy: Secondary | ICD-10-CM | POA: Diagnosis not present

## 2022-11-14 DIAGNOSIS — Z170 Estrogen receptor positive status [ER+]: Secondary | ICD-10-CM | POA: Insufficient documentation

## 2022-11-14 DIAGNOSIS — M25612 Stiffness of left shoulder, not elsewhere classified: Secondary | ICD-10-CM | POA: Insufficient documentation

## 2022-11-14 DIAGNOSIS — Z79811 Long term (current) use of aromatase inhibitors: Secondary | ICD-10-CM | POA: Diagnosis not present

## 2022-11-14 LAB — RAD ONC ARIA SESSION SUMMARY
Course Elapsed Days: 7
Plan Fractions Treated to Date: 5
Plan Fractions Treated to Date: 5
Plan Prescribed Dose Per Fraction: 1.8 Gy
Plan Prescribed Dose Per Fraction: 1.8 Gy
Plan Total Fractions Prescribed: 28
Plan Total Fractions Prescribed: 28
Plan Total Prescribed Dose: 50.4 Gy
Plan Total Prescribed Dose: 50.4 Gy
Reference Point Dosage Given to Date: 9 Gy
Reference Point Dosage Given to Date: 9 Gy
Reference Point Session Dosage Given: 1.8 Gy
Reference Point Session Dosage Given: 1.8 Gy
Session Number: 5

## 2022-11-14 NOTE — Therapy (Signed)
OUTPATIENT PHYSICAL THERAPY BREAST CANCER TREATMENT   Patient Name: Caitlyn Torres MRN: [id number] DOB:[birth year], 62 y.o., female Today's Date: 11/14/2022  END OF SESSION:  PT End of Session - 11/14/22 1217     Visit Number 8    Number of Visits 12    Date for PT Re-Evaluation 11/30/22    PT Start Time 1005    PT Stop Time 1050    PT Time Calculation (min) 45 min    Activity Tolerance Patient tolerated treatment well    Behavior During Therapy [facility] for tasks assessed/performed               Past Medical History:  Diagnosis Date   Anxiety    Arthritis    Cancer ([place]) 03/2022   L breast   Depression    Family history of breast cancer 05/15/2022   Family history of pancreatic cancer 05/15/2022   Family history of prostate cancer 05/15/2022   GERD (gastroesophageal reflux disease)    History of hiatal hernia    patient believes she may have been told she has this years ago   Past Surgical History:  Procedure Laterality Date   BREAST BIOPSY  09/19/2022   MM LT RADIOACTIVE SEED EA ADD LESION LOC MAMMO GUIDE 09/19/2022 [facility] MAMMOGRAP[facility]   BREAST BIOPSY  09/19/2022   MM LT RADIOACTIVE SEED LOC MAMMO GUIDE 09/19/2022 [facility]AMMO[facility]   BREAST LUMPECTOMY WITH RADIOACTIVE SEED AND AXILLARY LYMPH NODE DISSECTION Left 09/20/2022   Procedure: LEFT BREAST SEED LUMPECTOMY, LEFT AXILLARY LYMPH NODE DISSECTION;  Surgeon: Erroll Luna, MD;  Location: [place];  Service: General;  Laterality: Left;  GEN & PEC BLOCK   CERVIX SURGERY     Laser removal of pre-cancer cells   LASIK Bilateral    PORTACATH PLACEMENT Right 04/20/2022   Procedure: PORT PLACEMENT;  Surgeon: Erroll Luna, MD;  Location: [place];  Service: General;  Laterality: Right;   Patient Active Problem List   Diagnosis Date Noted   Other fatigue 08/28/2022   Acute pain 08/28/2022   Genetic testing 06/01/2022   Family history of breast cancer 05/15/2022   Family history of pancreatic cancer 05/15/2022   Family  history of prostate cancer 05/15/2022   Port-A-Cath in place 04/28/2022   Malignant neoplasm of upper-outer quadrant of left breast in female, estrogen receptor positive ([place]) 03/29/2022    PCP: Sidney Ace, MD  REFERRING PROVIDER: Nicholas Lose, MD  REFERRING DIAG: Left Breast Cancer  THERAPY DIAG:  Stiffness of L shoulder Stiffness of left shoulder, not elsewhere classified  Abnormal posture  Malignant neoplasm of upper-outer quadrant of left female breast, unspecified estrogen receptor status ([place])  Rationale for Evaluation and Treatment: Rehabilitation  ONSET DATE: 03/14/2022  SUBJECTIVE:  SUBJECTIVE STATEMENT:  Pt states she is doing well.  She is ready to back to a routine from the holidays.  She is still is having some pulling in her left arm when she reaches overhead.  She has walking occasionally at home.  She will be going to work today for about 5 hours.  PERTINENT HISTORY:  03/14/2022:Screening detected left breast masses 1.5 cm and 1.3 cm with enlarged left axillary lymph node, biopsy revealed grade 3 IDC with lymph node being positive, ER 95%, PR 0%, HER2 2+ by IHC and FISH positive with a ratio 2.45 and a copy #4.9, the second biopsy was HER2 negative with a ratio  1. Neoadjuvant chemotherapy with Herceptin Perjeta and letrozole for 8 cycles.  (Our original recommendation is TCHP x6 cycles but patient is not willing to go through chemo) 2. left lumpectomy and ALND 09/20/2022: 0.6 cm IDC 2/14 lymph nodes, margins negative, ER 95%, PR 0%, HER2 positive 3. Followed by adjuvant radiation therapy  4.  Adjuvant antiestrogen therapy with neratinib She had left lumpectomy on 09/20/2022 with 2/14 LN's  PATIENT GOALS:  Reassess how my recovery is going related to arm function, pain, and  swelling.  PAIN:  Are you having pain? 0/10  PRECAUTIONS: Recent Surgery, left UE Lymphedema risk,   ACTIVITY LEVEL / LEISURE: not yet,   OBJECTIVE:   PATIENT SURVEYS:   11/03/2022:   30 second sit to stand 12 reps with 1/10 RPE  pt felt she could do more  QUICK DASH: 34.09  OBSERVATIONS: Incisions healed with glue still present both incisions. Mild firmness under breast incision and foam pad made to place over it in bra. Several cords visible and palpable in axillary region and running to medial elbow.  POSTURE:  Forward head rounded shoulders  LYMPHEDEMA ASSESSMENT:   UPPER EXTREMITY AROM/PROM:   A/PROM RIGHT   eval    Shoulder extension 47  Shoulder flexion 163  Shoulder abduction 180  Shoulder internal rotation 67  Shoulder external rotation 107                          (Blank rows = not tested)   A/PROM LEFT   eval LEFT 10/19/2022 LEFT 10/24/22 LEFT 10/31/2022 LEFT  11/03/2022  Shoulder extension 60 40 68    Shoulder flexion 165 142 147 162 165  Shoulder abduction 180 74 114 143 163  Shoulder internal rotation 57      Shoulder external rotation 107                              (Blank rows = not tested)     CERVICAL AROM: All within normal limits:          UPPER EXTREMITY STRENGTH: WNL right, Left NT due to sx     LYMPHEDEMA ASSESSMENTS:    LANDMARK RIGHT   Eval 03/31/2022 RIGHT 10/19/2022  10 cm proximal to olecranon process 24.0 22.7  Olecranon process 22.3 21.6  10 cm proximal to ulnar styloid process 20.9 20.4  Just proximal to ulnar styloid process 14.6 14.35  Across hand at thumb web space 19.3 19.0  At base of 2nd digit 5.9 5.6  (Blank rows = not tested)   [facility] LEFT   Eval 03/31/2022 Left 10/19/2022  10 cm proximal to olecranon process 23.6 22.8  Olecranon process 21.6 21.6  10 cm proximal to ulnar styloid process 19.5 18.9  Just proximal to ulnar styloid process 14.15 14.5  Across hand at thumb web space 18.45 18.0  At base  of 2nd digit 5.7 5.4  (Blank rows = not tested)      Surgery type/Date: Left lumpectomy with SLNB Number of lymph nodes removed: 2/14 Current/past treatment (chemo, radiation, hormone therapy): Herceptin, radiation pending, anti estrogens Other symptoms:  Heaviness/tightness Yes Pain Yes Pitting edema No Infections No Decreased scar mobility No Stemmer sign No  TREATMENT PERFORMED TODAY: 11/14/2022 Overhead pulleys for shoulder flexion and abducton x 2 min Ball roll up wall into flexion and Lt UE abduction x10 Modified downward dog on wall 5x, 5 sec   To supine for PROM to left shoulder into external rotation, flexion and aduction with good stretch  Manual work to left arm with cocoa butter along cording path with longitudinal stretch and S stroke to soft tissue to bend cord. Radiaoplex gel gently to pt axilla and below breast per her request with gentle stationary circles and skin stretch on abdomen and lateral chest to support lymphatic flow.   11/09/22: Therapeutic Exercises Overhead pulleys for shoulder flexion and abducton x 2 min Ball roll up wall into flexion and Lt UE abduction x10 each returning therapist demo Modified downward dog on wall 5x, 5 sec holds returning therapist demo Supine over half roll for bil horz abd and then bil UE "V" into scaption x10 each  Reviewed supine scapular series with yellow theraband x5 each, mild pain reported with narrow grip flexion so encouraged pt to limit ROM to stopping before pain Manual Therapy PROM left shoulder flexion, abduction, and D2 with scapular depression throughout MFR: To Lt axilla and into medial upper arm to antecubital fossa along cord  11/07/2022  Overhead pulleys for shoulder flexion and abducton x 2 min Supine wand flexion and scaption x 4,  PROM left shoulder flexion, scaption, abduction MFR techniques with longitudinal stretch and S technique to decrease cording Left upper extremity Supine scapular series with  yellow band x 5 ea Tried LTR but held as pt did not feel stretching with this today. Updated HEP and gave yellow band   11/03/2022 Sitting: scapular and upper thoracic ROM, Pt noting that she feels tight with thoracic rotation.  Pulley exercises for shoulder flexion, abduction and scaption with cues to feel stretch down into sidebody.  To supine for PROM to left shoulder into external rotation, flexion and aduction with good stretch  and empty end feel. Pt with some stretch in axilla  with cord visible occasional pull into  upper arm. To sidelying for active abduction, flexion and small circles with hand pointed to ceiling and backward thoracic rotation.To supine for lower trunk rotation and bridges.  Sit to stand in 30 sec. Test for 12 reps.       PATIENT EDUCATION:  Education details: supine flexion and stargazer, standing wall slide, sitting or standing scapular retraction. Person educated: Patient Education method: Explanation, Demonstration, and Handouts Education comprehension: verbalized understanding and returned demonstration  HOME EXERCISE PROGRAM: Reviewed previously given post op HEP. Practiced each x 5-7 reps and gave pt new handout secondary to misplacing hers.  ASSESSMENT:  CLINICAL IMPRESSION: Pt is improving with ROM though cording is still present.  She is receiving benefit from treatment and wants to contnue    Pt will benefit from skilled therapeutic intervention to improve on the following deficits: Decreased knowledge of precautions, impaired UE functional use, pain, decreased ROM, postural dysfunction.   PT treatment/interventions: ADL/Self care home  management, Therapeutic exercises, Therapeutic activity, Neuromuscular re-education, Patient/Family education, Self Care, Orthotic/Fit training, scar mobilization, Manual therapy, and Re-evaluation   GOALS: Goals reviewed with patient? Yes  LONG TERM GOALS:  (STG=LTG)  GOALS Name Target Date  Goal status  1 Pt  will demonstrate she has regained full shoulder ROM and function post operatively compared to baselines.  Baseline: 11/30/2022 MET  2 Pt will have quick dash no greater than 15% to demonstrate improved function 11/30/2022 INITIAL  3 Pt will have improved pain by 50% or greater 11/30/2022 INITIAL  4 Pt will be fit for a compression sleeve/gauntlet and will be independent in donning and doffing 11/30/2022 INITIAL     PLAN:  PT FREQUENCY/DURATION: 2X/week x 6 weeks  PLAN FOR NEXT SESSION: Review supine scapular series prn.Continue pulleys, ball, STM left UQ, AAROM, PROM, MFR to cording, scar mobs ,  update HEP prn  Maudry Diego, PT 11/14/22 12:23 PM   [facility]  [address] [place] [address]  [place] [address]  [phone]

## 2022-11-15 ENCOUNTER — Other Ambulatory Visit: Payer: Self-pay

## 2022-11-15 ENCOUNTER — Ambulatory Visit
Admission: RE | Admit: 2022-11-15 | Discharge: 2022-11-15 | Disposition: A | Discharge status: Home / Self Care | Payer: BC Managed Care – PPO | Source: Ambulatory Visit | Attending: Radiation Oncology | Admitting: Radiation Oncology

## 2022-11-15 DIAGNOSIS — C50412 Malignant neoplasm of upper-outer quadrant of left female breast: Secondary | ICD-10-CM | POA: Diagnosis not present

## 2022-11-15 LAB — RAD ONC ARIA SESSION SUMMARY

## 2022-11-16 ENCOUNTER — Encounter: Payer: Self-pay | Admitting: Hematology and Oncology

## 2022-11-16 ENCOUNTER — Other Ambulatory Visit: Payer: Self-pay

## 2022-11-16 ENCOUNTER — Ambulatory Visit
Admission: RE | Admit: 2022-11-16 | Discharge: 2022-11-16 | Disposition: A | Discharge status: Home / Self Care | Payer: BC Managed Care – PPO | Source: Ambulatory Visit | Attending: Radiation Oncology | Admitting: Radiation Oncology

## 2022-11-16 ENCOUNTER — Ambulatory Visit: Payer: BC Managed Care – PPO

## 2022-11-16 ENCOUNTER — Encounter (HOSPITAL_COMMUNITY): Payer: Self-pay | Admitting: *Deleted

## 2022-11-16 ENCOUNTER — Ambulatory Visit (HOSPITAL_COMMUNITY)
Admission: EM | Admit: 2022-11-16 | Discharge: 2022-11-16 | Disposition: A | Discharge status: Home / Self Care | Payer: BC Managed Care – PPO | Attending: Emergency Medicine | Admitting: Emergency Medicine

## 2022-11-16 DIAGNOSIS — Z20828 Contact with and (suspected) exposure to other viral communicable diseases: Secondary | ICD-10-CM

## 2022-11-16 DIAGNOSIS — J111 Influenza due to unidentified influenza virus with other respiratory manifestations: Secondary | ICD-10-CM | POA: Diagnosis not present

## 2022-11-16 DIAGNOSIS — C50412 Malignant neoplasm of upper-outer quadrant of left female breast: Secondary | ICD-10-CM | POA: Diagnosis not present

## 2022-11-16 LAB — RAD ONC ARIA SESSION SUMMARY
Course Elapsed Days: 9
Plan Fractions Treated to Date: 7
Plan Fractions Treated to Date: 7
Plan Prescribed Dose Per Fraction: 1.8 Gy
Plan Prescribed Dose Per Fraction: 1.8 Gy
Plan Total Fractions Prescribed: 28
Plan Total Fractions Prescribed: 28
Plan Total Prescribed Dose: 50.4 Gy
Plan Total Prescribed Dose: 50.4 Gy
Reference Point Dosage Given to Date: 12.6 Gy
Reference Point Dosage Given to Date: 12.6 Gy
Reference Point Session Dosage Given: 1.8 Gy
Reference Point Session Dosage Given: 1.8 Gy
Session Number: 7

## 2022-11-16 LAB — POC INFLUENZA A AND B ANTIGEN (URGENT CARE ONLY)
INFLUENZA A ANTIGEN, POC: NEGATIVE
INFLUENZA B ANTIGEN, POC: NEGATIVE

## 2022-11-16 MED ORDER — OSELTAMIVIR PHOSPHATE 75 MG PO CAPS: 75.0000 mg | ORAL_CAPSULE | Freq: Two times a day (BID) | ORAL | 0 refills | Status: DC

## 2022-11-16 NOTE — ED Provider Notes (Signed)
[place]    CSN: [id number] Arrival date & time: 11/16/22  1036     History   Chief Complaint Chief Complaint  Patient presents with   Fever   Sore Throat   Generalized Body Aches    HPI Caitlyn Torres is a 62 y.o. female.  Last night sore throat started Had low grade 100.3 fever, went down with tylenol No fever today No cough or congestion No vomiting or abd pain  Two sick contacts at home with positive flu  Undergoing treatment for breast cancer  Tylenol allergy but takes with benadryl  Past Medical History:  Diagnosis Date   Anxiety    Arthritis    Cancer ([place]) 03/2022   L breast   Depression    Family history of breast cancer 05/15/2022   Family history of pancreatic cancer 05/15/2022   Family history of prostate cancer 05/15/2022   GERD (gastroesophageal reflux disease)    History of hiatal hernia    patient believes she may have been told she has this years ago    Patient Active Problem List   Diagnosis Date Noted   Other fatigue 08/28/2022   Acute pain 08/28/2022   Genetic testing 06/01/2022   Family history of breast cancer 05/15/2022   Family history of pancreatic cancer 05/15/2022   Family history of prostate cancer 05/15/2022   Port-A-Cath in place 04/28/2022   Malignant neoplasm of upper-outer quadrant of left breast in female, estrogen receptor positive ([place]) 03/29/2022    Past Surgical History:  Procedure Laterality Date   BREAST BIOPSY  09/19/2022   MM LT RADIOACTIVE SEED EA ADD LESION LOC MAMMO GUIDE 09/19/2022 [facility]MOGRAP[facility]   BREAST BIOPSY  09/19/2022   MM LT RADIOACTIVE SEED LOC MAMMO GUIDE 09/19/2022 [facility]MO[facility]   BREAST LUMPECTOMY WITH RADIOACTIVE SEED AND AXILLARY LYMPH NODE DISSECTION Left 09/20/2022   Procedure: LEFT BREAST SEED LUMPECTOMY, LEFT AXILLARY LYMPH NODE DISSECTION;  Surgeon: Erroll Luna, MD;  Location: [place] [facility];  Service: General;  Laterality: Left;  GEN & PEC BLOCK   CERVIX SURGERY      Laser removal of pre-cancer cells   LASIK Bilateral    PORTACATH PLACEMENT Right 04/20/2022   Procedure: PORT PLACEMENT;  Surgeon: Erroll Luna, MD;  Location: [place];  Service: General;  Laterality: Right;    OB History   No obstetric history on file.      Home Medications    Prior to Admission medications   Medication Sig Start Date End Date Taking? Authorizing Provider  Brimonidine Tartrate (LUMIFY) 0.025 % SOLN Place 1 drop into both eyes daily as needed (red eyes).   Yes [provider]  carboxymethylcellulose (REFRESH PLUS) 0.5 % SOLN Place 1 drop into both eyes 3 (three) times daily as needed (dry eyes).   Yes [provider]  cetirizine (ZYRTEC) 10 MG tablet Take 10 mg by mouth daily.   Yes [provider]  Lidocaine-Prilocaine &Lido HCl 2.5-2.5 & 3.88 % KIT APPLY TO AFFECTED AREA ONCE AS DIRECTED   Yes [provider]  magic mouthwash w/lidocaine SOLN Take 5 mLs by mouth 4 (four) times daily as needed for mouth pain. 05/09/22  Yes Nicholas Lose, MD  Multiple Vitamins-Iron (CHLORELLA) CAPS Take 1 capsule by mouth daily.   Yes [provider]  ondansetron (ZOFRAN) 8 MG tablet Take 1 tablet (8 mg total) by mouth every 8 (eight) hours as needed for nausea or vomiting. 10/02/22  Yes Nicholas Lose, MD  oseltamivir (TAMIFLU)  75 MG capsule Take 1 capsule (75 mg total) by mouth 2 (two) times daily for 5 days. 11/16/22 11/21/22 Yes Deeana Atwater, PA-C  OVER THE COUNTER MEDICATION Take 1 capsule by mouth daily. Amla   Yes [provider]  OVER THE COUNTER MEDICATION Take 1 capsule by mouth daily. Mushroom Complex   Yes [provider]  OVER THE COUNTER MEDICATION Place 1 Application vaginally daily as needed (moisture). Femininity   Yes [provider]  QUERCETIN PO Take 1 capsule by mouth daily.   Yes [provider]  sodium chloride (OCEAN) 0.65 % SOLN nasal spray Place 1 spray into both nostrils as needed for  congestion.   Yes [provider]  TURMERIC CURCUMIN PO Take 1 capsule by mouth daily.   Yes [provider]    Family History Family History  Problem Relation Age of Onset   Prostate cancer Father        dx after 33   Breast cancer Sister 73       neg GT   Prostate cancer Maternal Grandfather        d. late 43s   Pancreatic cancer Paternal Grandmother        d. 69s   Parkinson's disease Paternal Grandmother    Bladder Cancer Paternal Grandfather        dx after 46   Breast cancer Cousin        paternal female cousin; dx 58s    Social History Social History   Tobacco Use   Smoking status: Former    Packs/day: 1.00    Years: 20.00    Total pack years: 20.00    Types: Cigarettes   Smokeless tobacco: Never  Vaping Use   Vaping Use: Never used  Substance Use Topics   Alcohol use: Not Currently   Drug use: Never     Allergies   Aleve [naproxen sodium], Aspirin, Macrobid [nitrofurantoin], Motrin [ibuprofen], and Tylenol [acetaminophen]   Review of Systems Review of Systems As per HPI  Physical Exam Triage Vital Signs ED Triage Vitals  Enc Vitals Group     BP 11/16/22 1254 (!) 134/94     Pulse Rate 11/16/22 1254 88     Resp 11/16/22 1254 18     Temp 11/16/22 1254 98.9 F (37.2 C)     Temp Source 11/16/22 1254 Oral     SpO2 11/16/22 1254 95 %     Weight --      Height --      Head Circumference --      Peak Flow --      Pain Score 11/16/22 1252 8     Pain Loc --      Pain Edu? --      Excl. in [place]? --    No data found.  Updated Vital Signs BP (!) 134/94 (BP Location: Right Arm)   Pulse 88   Temp 98.9 F (37.2 C) (Oral)   Resp 18   LMP 09/25/2019   SpO2 95%   Physical Exam Vitals and nursing note reviewed.  Constitutional:      General: She is not in acute distress.    Appearance: She is not ill-appearing.  HENT:     Right Ear: Tympanic membrane and ear canal normal.     Left Ear: Tympanic membrane and ear canal normal.      Nose: No congestion or rhinorrhea.     Mouth/Throat:     Mouth: Mucous membranes are moist.  Pharynx: Oropharynx is clear. No posterior oropharyngeal erythema.     Tonsils: No tonsillar exudate or tonsillar abscesses. 0 on the right. 0 on the left.  Eyes:     Conjunctiva/sclera: Conjunctivae normal.  Cardiovascular:     Rate and Rhythm: Normal rate and regular rhythm.     Pulses: Normal pulses.     Heart sounds: Normal heart sounds.  Pulmonary:     Effort: Pulmonary effort is normal.     Breath sounds: Normal breath sounds.  Lymphadenopathy:     Cervical: No cervical adenopathy.  Skin:    General: Skin is warm and dry.  Neurological:     Mental Status: She is alert and oriented to person, place, and time.      UC Treatments / Results  Labs (all labs ordered are listed, but only abnormal results are displayed) Labs Reviewed  POC INFLUENZA A AND B ANTIGEN (URGENT CARE ONLY)    EKG   Radiology No results found.  Procedures Procedures (including critical care time)  Medications Ordered in UC Medications - No data to display  Initial Impression / Assessment and Plan / UC Course  I have reviewed the triage vital signs and the nursing notes.  Pertinent labs & imaging results that were available during my care of the patient were reviewed by me and considered in my medical decision making (see chart for details).  Afebrile here  Discussed although fever may be related to flu, if she has temp over 100.5 she needs to be evaluated in the emergency department.   Flu test is negative here. May be false negative. With positive flu exposures and patient history, treating with tamiflu. Tamiflu paper prescription Discussed symptomatic care and tylenol in the meantime. She will contact oncologist as well.  Strict ED precautions. Patient agrees to plan  Final Clinical Impressions(s) / UC Diagnoses   Final diagnoses:  Influenza-like illness  Exposure to the flu      Discharge Instructions      Your flu test was negative. With your two positive exposures, I am treating you for influenza. Tamiflu paper prescription can be taken to any pharmacy. You can start this medication today.  Please follow up with your oncologist regarding your symptoms.  If you have temp over 100.5 please go to the emergency department for evaluation.     ED Prescriptions     Medication Sig Dispense Auth. Provider   oseltamivir (TAMIFLU) 75 MG capsule Take 1 capsule (75 mg total) by mouth 2 (two) times daily for 5 days. 10 capsule Dekisha Mesmer, Wells Guiles, PA-C      PDMP not reviewed this encounter.   [place], [place] 11/16/22 1423

## 2022-11-16 NOTE — Discharge Instructions (Addendum)
Your flu test was negative. With your two positive exposures, I am treating you for influenza. Tamiflu paper prescription can be taken to any pharmacy. You can start this medication today.  Please follow up with your oncologist regarding your symptoms.  If you have temp over 100.5 please go to the emergency department for evaluation.

## 2022-11-16 NOTE — ED Triage Notes (Signed)
Pt states she has been having body aches, fever and sore throat. She has been exposed to flu by her daughter and SIL. She has taken sudafed, benadryl and tylenol.   She breast cancer and is currently doing treatments.

## 2022-11-16 NOTE — Progress Notes (Signed)
Patient Care Team: Bradd Canary, MD as PCP - General (Family Medicine) Mauro Kaufmann, RN as Oncology Nurse Navigator Rockwell Germany, RN as Oncology Nurse Navigator Nicholas Lose, MD as Consulting Physician (Hematology and Oncology)  DIAGNOSIS:  Encounter Diagnosis  Name Primary?   Malignant neoplasm of upper-outer quadrant of left breast in female, estrogen receptor positive (Caitlyn Torres) Yes    SUMMARY OF ONCOLOGIC HISTORY: Oncology History  Malignant neoplasm of upper-outer quadrant of left breast in female, estrogen receptor positive (Caitlyn Torres)  03/14/2022 Initial Diagnosis   Screening detected left breast masses 1.5 cm and 1.3 cm with enlarged left axillary lymph node, biopsy revealed grade 3 IDC with lymph node being positive, ER 95%, PR 0%, HER2 2+ by IHC and FISH positive with a ratio 2.45 and a copy #4.9, the second biopsy was HER2 negative with a ratio 2.15 and a copy #3.55   03/29/2022 Cancer Staging   Staging form: Breast, AJCC 8th Edition - Clinical: Stage IIA (cT1c, cN1, cM0, G3, ER+, PR-, HER2+) - Signed by Nicholas Lose, MD on 03/29/2022 Stage prefix: Initial diagnosis Histologic grading system: 3 grade system   03/29/2022 -  Anti-estrogen oral therapy   Letrozole daily   04/21/2022 -  Chemotherapy   Herceptin/Perjeta every 21 days initially x 3, will reassess with MRI and then will decide whether to continue or to add chemo.  (Original recommendation was TCHP x 6)      Genetic Testing   [facility] CancerNext-Expanded Panel was Negative. Report date is 05/30/2022.  The CancerNext-Expanded gene panel offered by [facility] and includes sequencing, rearrangement, and RNA analysis for the following 77 genes: AIP, ALK, APC, ATM, AXIN2, BAP1, BARD1, BLM, BMPR1A, BRCA1, BRCA2, BRIP1, CDC73, CDH1, CDK4, CDKN1B, CDKN2A, CHEK2, CTNNA1, DICER1, FANCC, FH, FLCN, GALNT12, KIF1B, LZTR1, MAX, MEN1, MET, MLH1, MSH2, MSH3, MSH6, MUTYH, NBN, NF1, NF2, NTHL1, PALB2, PHOX2B, PMS2, POT1, PRKAR1A,  PTCH1, PTEN, RAD51C, RAD51D, RB1, RECQL, RET, SDHA, SDHAF2, SDHB, SDHC, SDHD, SMAD4, SMARCA4, SMARCB1, SMARCE1, STK11, SUFU, TMEM127, TP53, TSC1, TSC2, VHL and XRCC2 (sequencing and deletion/duplication); EGFR, EGLN1, HOXB13, KIT, MITF, PDGFRA, POLD1, and POLE (sequencing only); EPCAM and GREM1 (deletion/duplication only).    04/21/2022 - 09/14/2022 Chemotherapy   Patient is on Treatment Plan : BREAST Trastuzumab  + Pertuzumab q21d x 13 cycles     10/13/2022 -  Chemotherapy   Patient is on Treatment Plan : BREAST ADO-Trastuzumab Emtansine (Kadcyla) q21d       CHIEF COMPLIANT: Kadcyla cycle 3  INTERVAL HISTORY: Caitlyn Torres is a 62 year old above-mentioned history of HER2 positive breast cancer was currently on Herceptin Prejeta. She presents to the clinic for a follow-up and cycle 3 Kadcyla.   She is tolerating Kadcyla extremely well without any problems or concerns.  Recently she had influenza upper respiratory infections she still has a sore throat but denies any fevers or chills.   ALLERGIES:  is allergic to aleve [naproxen sodium], aspirin, macrobid [nitrofurantoin], motrin [ibuprofen], and tylenol [acetaminophen].  MEDICATIONS:  Current Outpatient Medications  Medication Sig Dispense Refill   benzonatate (TESSALON) 100 MG capsule Take 1 capsule (100 mg total) by mouth 3 (three) times daily as needed. 30 capsule 0   Brimonidine Tartrate (LUMIFY) 0.025 % SOLN Place 1 drop into both eyes daily as needed (red eyes).     carboxymethylcellulose (REFRESH PLUS) 0.5 % SOLN Place 1 drop into both eyes 3 (three) times daily as needed (dry eyes).     cetirizine (ZYRTEC) 10 MG tablet Take 10  mg by mouth daily.     fluticasone (FLONASE) 50 MCG/ACT nasal spray Place 2 sprays into both nostrils daily. 16 g 0   Lidocaine-Prilocaine &Lido HCl 2.5-2.5 & 3.88 % KIT APPLY TO AFFECTED AREA ONCE AS DIRECTED     magic mouthwash w/lidocaine SOLN Take 5 mLs by mouth 4 (four) times daily as needed for mouth  pain. 240 mL 1   Multiple Vitamins-Iron (CHLORELLA) CAPS Take 1 capsule by mouth daily.     ondansetron (ZOFRAN) 8 MG tablet Take 1 tablet (8 mg total) by mouth every 8 (eight) hours as needed for nausea or vomiting. 30 tablet 1   OVER THE COUNTER MEDICATION Take 1 capsule by mouth daily. Amla     OVER THE COUNTER MEDICATION Take 1 capsule by mouth daily. Mushroom Complex     OVER THE COUNTER MEDICATION Place 1 Application vaginally daily as needed (moisture). Femininity     QUERCETIN PO Take 1 capsule by mouth daily.     sodium chloride (OCEAN) 0.65 % SOLN nasal spray Place 1 spray into both nostrils as needed for congestion.     trimethoprim-polymyxin b (POLYTRIM) ophthalmic solution Place 1 drop into the right eye every 4 (four) hours. X 5 days 10 mL 0   TURMERIC CURCUMIN PO Take 1 capsule by mouth daily.     No current facility-administered medications for this visit.    PHYSICAL EXAMINATION: ECOG PERFORMANCE STATUS: 1 - Symptomatic but completely ambulatory  Vitals:   11/24/22 0807  BP: (!) 149/88  Pulse: 82  Resp: 16  Temp: (!) 97.5 F (36.4 C)  SpO2: 99%   Filed Weights   11/24/22 0807  Weight: 120 lb 1 oz (54.5 kg)      LABORATORY DATA:  I have reviewed the data as listed    Latest Ref Rng & Units 11/02/2022    8:07 AM 10/13/2022    8:57 AM 09/13/2022    8:54 AM  CMP  Glucose 70 - 99 mg/dL 100  110  91   BUN 6 - 20 mg/dL _0 Creatinine 0.44 - 1.00 mg/dL 0.65  0.62  0.68   Sodium 135 - 145 mmol/L 139  139  137   Potassium 3.5 - 5.1 mmol/L 3.9  4.0  4.3   Chloride 98 - 111 mmol/L 105  105  97   CO2 22 - 32 mmol/L _1 Calcium 8.9 - 10.3 mg/dL 9.8  10.2  10.0   Total Protein 6.5 - 8.1 g/dL 6.8  7.2    Total Bilirubin 0.3 - 1.2 mg/dL 0.4  0.4    Alkaline Phos 38 - 126 U/L 63  61    AST 15 - 41 U/L 51  28    ALT 0 - 44 U/L 75  33      Lab Results  Component Value Date   WBC 5.4 11/24/2022   HGB 11.9 (L) 11/24/2022   HCT 34.6 (L) 11/24/2022    MCV 83.0 11/24/2022   PLT 234 11/24/2022   NEUTROABS 3.7 11/24/2022    ASSESSMENT & PLAN:  Malignant neoplasm of upper-outer quadrant of left breast in female, estrogen receptor positive ([place]) 03/14/2022:Screening detected left breast masses 1.5 cm and 1.3 cm with enlarged left axillary lymph node, biopsy revealed grade 3 IDC with lymph node being positive, ER 95%, PR 0%, HER2 2+ by IHC and FISH positive with a ratio 2.45 and a copy #4.9, the second  biopsy was HER2 negative with a ratio 2.15 and a copy #3.55   Treatment plan based on multidisciplinary tumor board: 1. Neoadjuvant chemotherapy with Herceptin Perjeta and letrozole for 8 cycles.  (Our original recommendation is TCHP x6 cycles but patient is not willing to go through chemo) 2. left lumpectomy and ALND 09/20/2022: 0.6 cm IDC 2/14 lymph nodes, margins negative, ER 95%, PR 0%, HER2 positive 3. Followed by adjuvant radiation therapy started 11/02/2022 4.  Adjuvant antiestrogen therapy with neratinib ------------------------------------------------------------------------------------------ Current treatment: Kadcyla maintenance cycle 3 Kadcyla toxicities: Fatigue Leukopenia: Monitoring closely   Recent flu illness: Patient was sick for several days last week she was prescribed Tamiflu which made her more nauseated.  Therefore she stopped it she is over it and she is got a bit of sore throat but otherwise doing well.  Return to clinic in 3 weeks for cycle 4    No orders of the defined types were placed in this encounter.  The patient has a good understanding of the overall plan. she agrees with it. she will call with any problems that may develop before the next visit here. Total time spent: 30 mins including face to face time and time spent for planning, charting and co-ordination of care   Harriette Ohara, MD 11/24/22    I Gardiner Coins am acting as a Education administrator for [employer]  I have reviewed the above documentation  for accuracy and completeness, and I agree with the above.

## 2022-11-17 ENCOUNTER — Other Ambulatory Visit: Payer: Self-pay

## 2022-11-17 ENCOUNTER — Ambulatory Visit
Admission: RE | Admit: 2022-11-17 | Discharge: 2022-11-17 | Disposition: A | Discharge status: Home / Self Care | Payer: BC Managed Care – PPO | Source: Ambulatory Visit | Attending: Radiation Oncology | Admitting: Radiation Oncology

## 2022-11-17 DIAGNOSIS — C50412 Malignant neoplasm of upper-outer quadrant of left female breast: Secondary | ICD-10-CM | POA: Diagnosis not present

## 2022-11-17 LAB — RAD ONC ARIA SESSION SUMMARY
Course Elapsed Days: 10
Plan Fractions Treated to Date: 8
Plan Fractions Treated to Date: 8
Plan Prescribed Dose Per Fraction: 1.8 Gy
Plan Prescribed Dose Per Fraction: 1.8 Gy
Plan Total Fractions Prescribed: 28
Plan Total Fractions Prescribed: 28
Plan Total Prescribed Dose: 50.4 Gy
Plan Total Prescribed Dose: 50.4 Gy
Reference Point Dosage Given to Date: 14.4 Gy
Reference Point Dosage Given to Date: 14.4 Gy
Reference Point Session Dosage Given: 1.8 Gy
Reference Point Session Dosage Given: 1.8 Gy
Session Number: 8

## 2022-11-20 ENCOUNTER — Ambulatory Visit
Admission: RE | Admit: 2022-11-20 | Discharge: 2022-11-20 | Disposition: A | Discharge status: Home / Self Care | Payer: BC Managed Care – PPO | Source: Ambulatory Visit | Attending: Radiation Oncology | Admitting: Radiation Oncology

## 2022-11-20 ENCOUNTER — Encounter: Payer: Self-pay | Admitting: Hematology and Oncology

## 2022-11-20 ENCOUNTER — Other Ambulatory Visit: Payer: Self-pay

## 2022-11-20 DIAGNOSIS — C50412 Malignant neoplasm of upper-outer quadrant of left female breast: Secondary | ICD-10-CM | POA: Diagnosis not present

## 2022-11-20 LAB — RAD ONC ARIA SESSION SUMMARY
Course Elapsed Days: 13
Plan Fractions Treated to Date: 9
Plan Fractions Treated to Date: 9
Plan Prescribed Dose Per Fraction: 1.8 Gy
Plan Prescribed Dose Per Fraction: 1.8 Gy
Plan Total Fractions Prescribed: 28
Plan Total Fractions Prescribed: 28
Plan Total Prescribed Dose: 50.4 Gy
Plan Total Prescribed Dose: 50.4 Gy
Reference Point Dosage Given to Date: 16.2 Gy
Reference Point Dosage Given to Date: 16.2 Gy
Reference Point Session Dosage Given: 1.8 Gy
Reference Point Session Dosage Given: 1.8 Gy
Session Number: 9

## 2022-11-21 ENCOUNTER — Telehealth: Payer: BC Managed Care – PPO | Admitting: Physician Assistant

## 2022-11-21 ENCOUNTER — Ambulatory Visit: Payer: BC Managed Care – PPO

## 2022-11-21 DIAGNOSIS — J069 Acute upper respiratory infection, unspecified: Secondary | ICD-10-CM | POA: Diagnosis not present

## 2022-11-21 DIAGNOSIS — H109 Unspecified conjunctivitis: Secondary | ICD-10-CM

## 2022-11-21 MED ORDER — POLYMYXIN B-TRIMETHOPRIM 10000-0.1 UNIT/ML-% OP SOLN: 1.0000 [drp] | OPHTHALMIC | 0 refills | Status: DC

## 2022-11-21 MED ORDER — BENZONATATE 100 MG PO CAPS: 100.0000 mg | ORAL_CAPSULE | Freq: Three times a day (TID) | ORAL | 0 refills | Status: DC | PRN

## 2022-11-21 MED ORDER — FLUTICASONE PROPIONATE 50 MCG/ACT NA SUSP: 2.0000 | Freq: Every day | NASAL | 0 refills | Status: DC

## 2022-11-21 NOTE — Progress Notes (Signed)
E-Visit for [employer]   We are sorry that you are not feeling well.  Here is how we plan to help!  Based on what you have shared with me it looks like you have conjunctivitis.  Conjunctivitis is a common inflammatory or infectious condition of the eye that is often referred to as "pink eye".  In most cases it is contagious (viral or bacterial). However, not all conjunctivitis requires antibiotics (ex. Allergic).  We have made appropriate suggestions for you based upon your presentation.  I have prescribed Polytrim Ophthalmic drops 1-2 drops 4 times a day times 5 days  Pink eye can be highly contagious.  It is typically spread through direct contact with secretions, or contaminated objects or surfaces that one may have touched.  Strict handwashing is suggested with soap and water is urged.  If not available, use alcohol based had sanitizer.  Avoid unnecessary touching of the eye.  If you wear contact lenses, you will need to refrain from wearing them until you see no white discharge from the eye for at least 24 hours after being on medication.  You should see symptom improvement in 1-2 days after starting the medication regimen.  Call us if symptoms are not improved in 1-2 days.  E-Visit for Upper Respiratory Infection   We are sorry you are not feeling well.  Here is how we plan to help!  Based on what you have shared with me, it looks like you may have a viral upper respiratory infection.  Upper respiratory infections are caused by a large number of viruses; however, rhinovirus is the most common cause. With the symptoms you have been having and having pink eye follow your symptoms seem more consistent with an adenovirus infection.   Symptoms vary from person to person, with common symptoms including sore throat, cough, fatigue or lack of energy and feeling of general discomfort.  A low-grade fever of up to 100.4 may present, but is often uncommon.  Symptoms vary however, and are closely related to  a person's age or underlying illnesses.  The most common symptoms associated with an upper respiratory infection are nasal discharge or congestion, cough, sneezing, headache and pressure in the ears and face.  These symptoms usually persist for about 3 to 10 days, but can last up to 2 weeks.  It is important to know that upper respiratory infections do not cause serious illness or complications in most cases.    Upper respiratory infections can be transmitted from person to person, with the most common method of transmission being a person's hands.  The virus is able to live on the skin and can infect other persons for up to 2 hours after direct contact.  Also, these can be transmitted when someone coughs or sneezes; thus, it is important to cover the mouth to reduce this risk.  To keep the spread of the illness at [place], good hand hygiene is very important.  This is an infection that is most likely caused by a virus. There are no specific treatments other than to help you with the symptoms until the infection runs its course.  We are sorry you are not feeling well.  Here is how we plan to help!   For nasal congestion, you may use an oral decongestants such as Mucinex D or if you have glaucoma or high blood pressure use plain Mucinex.  Saline nasal spray or nasal drops can help and can safely be used as often as needed for congestion.  For your congestion, I have prescribed Fluticasone nasal spray one spray in each nostril twice a day  If you do not have a history of heart disease, hypertension, diabetes or thyroid disease, prostate/bladder issues or glaucoma, you may also use Sudafed to treat nasal congestion.  It is highly recommended that you consult with a pharmacist or your primary care physician to ensure this medication is safe for you to take.     If you have a cough, you may use cough suppressants such as Delsym and Robitussin.  If you have glaucoma or high blood pressure, you can also use  Coricidin HBP.   For cough I have prescribed for you A prescription cough medication called Tessalon Perles 100 mg. You may take 1-2 capsules every 8 hours as needed for cough  If you have a sore or scratchy throat, use a saltwater gargle-  to  teaspoon of salt dissolved in a 4-ounce to 8-ounce glass of warm water.  Gargle the solution for approximately 15-30 seconds and then spit.  It is important not to swallow the solution.  You can also use throat lozenges/cough drops and Chloraseptic spray to help with throat pain or discomfort.  Warm or cold liquids can also be helpful in relieving throat pain.  For headache, pain or general discomfort, you can use Ibuprofen or Tylenol as directed.   Some authorities believe that zinc sprays or the use of Echinacea may shorten the course of your symptoms.  Home Care: Wash your hands often! Do not wear your contacts until you complete your treatment plan. Avoid sharing towels, bed linen, personal items with a person who has pink eye. See attention for anyone in your home with similar symptoms.  Get Help Right Away If: Your symptoms do not improve. You develop blurred or loss of vision. Your symptoms worsen (increased discharge, pain or redness)   Thank you for choosing an e-visit.  Your e-visit answers were reviewed by a board certified advanced clinical practitioner to complete your personal care plan. Depending upon the condition, your plan could have included both over the counter or prescription medications.  Please review your pharmacy choice. Make sure the pharmacy is open so you can pick up prescription now. If there is a problem, you may contact your provider through [employer] and have the prescription routed to another pharmacy.  Your safety is important to [place]. If you have drug allergies check your prescription carefully.   For the next 24 hours you can use MyChart to ask questions about today's visit, request a non-urgent call back,  or ask for a work or school excuse. You will get an email in the next two days asking about your experience. I hope that your e-visit has been valuable and will speed your recovery.   I have spent 5 minutes in review of e-visit questionnaire, review and updating patient chart, medical decision making and response to patient.   Mar Daring, PA-C

## 2022-11-22 ENCOUNTER — Other Ambulatory Visit: Payer: Self-pay

## 2022-11-22 ENCOUNTER — Ambulatory Visit: Payer: BC Managed Care – PPO

## 2022-11-22 ENCOUNTER — Ambulatory Visit
Admission: RE | Admit: 2022-11-22 | Discharge: 2022-11-22 | Disposition: A | Discharge status: Home / Self Care | Payer: BC Managed Care – PPO | Source: Ambulatory Visit | Attending: Radiation Oncology | Admitting: Radiation Oncology

## 2022-11-22 DIAGNOSIS — C50412 Malignant neoplasm of upper-outer quadrant of left female breast: Secondary | ICD-10-CM | POA: Diagnosis not present

## 2022-11-22 LAB — RAD ONC ARIA SESSION SUMMARY
Course Elapsed Days: 15
Plan Fractions Treated to Date: 10
Plan Fractions Treated to Date: 10
Plan Prescribed Dose Per Fraction: 1.8 Gy
Plan Prescribed Dose Per Fraction: 1.8 Gy
Plan Total Fractions Prescribed: 28
Plan Total Fractions Prescribed: 28
Plan Total Prescribed Dose: 50.4 Gy
Plan Total Prescribed Dose: 50.4 Gy
Reference Point Dosage Given to Date: 18 Gy
Reference Point Dosage Given to Date: 18 Gy
Reference Point Session Dosage Given: 1.8 Gy
Reference Point Session Dosage Given: 1.8 Gy
Session Number: 10

## 2022-11-23 ENCOUNTER — Other Ambulatory Visit: Payer: Self-pay

## 2022-11-23 ENCOUNTER — Ambulatory Visit
Admission: RE | Admit: 2022-11-23 | Discharge: 2022-11-23 | Disposition: A | Discharge status: Home / Self Care | Payer: BC Managed Care – PPO | Source: Ambulatory Visit | Attending: Radiation Oncology | Admitting: Radiation Oncology

## 2022-11-23 ENCOUNTER — Ambulatory Visit: Payer: BC Managed Care – PPO

## 2022-11-23 DIAGNOSIS — M25612 Stiffness of left shoulder, not elsewhere classified: Secondary | ICD-10-CM

## 2022-11-23 DIAGNOSIS — R293 Abnormal posture: Secondary | ICD-10-CM

## 2022-11-23 DIAGNOSIS — C50412 Malignant neoplasm of upper-outer quadrant of left female breast: Secondary | ICD-10-CM | POA: Diagnosis not present

## 2022-11-23 LAB — RAD ONC ARIA SESSION SUMMARY
Course Elapsed Days: 16
Plan Fractions Treated to Date: 11
Plan Fractions Treated to Date: 11
Plan Prescribed Dose Per Fraction: 1.8 Gy
Plan Prescribed Dose Per Fraction: 1.8 Gy
Plan Total Fractions Prescribed: 28
Plan Total Fractions Prescribed: 28
Plan Total Prescribed Dose: 50.4 Gy
Plan Total Prescribed Dose: 50.4 Gy
Reference Point Dosage Given to Date: 19.8 Gy
Reference Point Dosage Given to Date: 19.8 Gy
Reference Point Session Dosage Given: 1.8 Gy
Reference Point Session Dosage Given: 1.8 Gy
Session Number: 11

## 2022-11-23 NOTE — Therapy (Signed)
OUTPATIENT PHYSICAL THERAPY BREAST CANCER TREATMENT   Patient Name: Caitlyn Torres MRN: [id number] DOB:10/25/1961, 62 y.o., female Today's Date: 11/23/2022  END OF SESSION:  PT End of Session - 11/23/22 0856     Visit Number 9    Number of Visits 12    Date for PT Re-Evaluation 11/30/22    PT Start Time 0900    PT Stop Time 0952    PT Time Calculation (min) 52 min    Activity Tolerance Patient tolerated treatment well    Behavior During Therapy [facility] for tasks assessed/performed               Past Medical History:  Diagnosis Date   Anxiety    Arthritis    Cancer ([place]) 03/2022   L breast   Depression    Family history of breast cancer 05/15/2022   Family history of pancreatic cancer 05/15/2022   Family history of prostate cancer 05/15/2022   GERD (gastroesophageal reflux disease)    History of hiatal hernia    patient believes she may have been told she has this years ago   Past Surgical History:  Procedure Laterality Date   BREAST BIOPSY  09/19/2022   MM LT RADIOACTIVE SEED EA ADD LESION LOC MAMMO GUIDE 09/19/2022 [facility]   BREAST BIOPSY  09/19/2022   MM LT RADIOACTIVE SEED LOC MAMMO GUIDE 09/19/2022 [facility]   BREAST LUMPECTOMY WITH RADIOACTIVE SEED AND AXILLARY LYMPH NODE DISSECTION Left 09/20/2022   Procedure: LEFT BREAST SEED LUMPECTOMY, LEFT AXILLARY LYMPH NODE DISSECTION;  Surgeon: Erroll Luna, MD;  Location: [place];  Service: General;  Laterality: Left;  GEN & PEC BLOCK   CERVIX SURGERY     Laser removal of pre-cancer cells   LASIK Bilateral    PORTACATH PLACEMENT Right 04/20/2022   Procedure: PORT PLACEMENT;  Surgeon: Erroll Luna, MD;  Location: [place];  Service: General;  Laterality: Right;   Patient Active Problem List   Diagnosis Date Noted   Other fatigue 08/28/2022   Acute pain 08/28/2022   Genetic testing 06/01/2022   Family history of breast cancer 05/15/2022   Family history of pancreatic cancer 05/15/2022   Family  history of prostate cancer 05/15/2022   Port-A-Cath in place 04/28/2022   Malignant neoplasm of upper-outer quadrant of left breast in female, estrogen receptor positive ([place]) 03/29/2022    PCP: Sidney Ace, MD  REFERRING PROVIDER: Nicholas Lose, MD  REFERRING DIAG: Left Breast Cancer  THERAPY DIAG:  Stiffness of L shoulder Stiffness of left shoulder, not elsewhere classified  Abnormal posture  Malignant neoplasm of upper-outer quadrant of left female breast, unspecified estrogen receptor status ([place])  Rationale for Evaluation and Treatment: Rehabilitation  ONSET DATE: 03/14/2022  SUBJECTIVE:  SUBJECTIVE STATEMENT:   I tested negative for flu but I have been sick for over a week. I feel fatigued from being sick. I couldn't keep up with anything while I have been sick. I had pink eye too. I have been to radiation all but 1 day.   PERTINENT HISTORY:  03/14/2022:Screening detected left breast masses 1.5 cm and 1.3 cm with enlarged left axillary lymph node, biopsy revealed grade 3 IDC with lymph node being positive, ER 95%, PR 0%, HER2 2+ by IHC and FISH positive with a ratio 2.45 and a copy #4.9, the second biopsy was HER2 negative with a ratio  1. Neoadjuvant chemotherapy with Herceptin Perjeta and letrozole for 8 cycles.  (Our original recommendation is TCHP x6 cycles but patient is not willing to go through chemo) 2. left lumpectomy and ALND 09/20/2022: 0.6 cm IDC 2/14 lymph nodes, margins negative, ER 95%, PR 0%, HER2 positive 3. Followed by adjuvant radiation therapy  4.  Adjuvant antiestrogen therapy with neratinib She had left lumpectomy on 09/20/2022 with 2/14 LN's  PATIENT GOALS:  Reassess how my recovery is going related to arm function, pain, and swelling.  PAIN:  Are you having pain?  0/10  PRECAUTIONS: Recent Surgery, left UE Lymphedema risk,   ACTIVITY LEVEL / LEISURE: not yet,   OBJECTIVE:   PATIENT SURVEYS:   11/03/2022:   30 second sit to stand 12 reps with 1/10 RPE  pt felt she could do more  QUICK DASH: 34.09  OBSERVATIONS: Incisions healed with glue still present both incisions. Mild firmness under breast incision and foam pad made to place over it in bra. Several cords visible and palpable in axillary region and running to medial elbow.  POSTURE:  Forward head rounded shoulders  LYMPHEDEMA ASSESSMENT:   UPPER EXTREMITY AROM/PROM:   A/PROM RIGHT   eval    Shoulder extension 47  Shoulder flexion 163  Shoulder abduction 180  Shoulder internal rotation 67  Shoulder external rotation 107                          (Blank rows = not tested)   A/PROM LEFT   eval LEFT 10/19/2022 LEFT 10/24/22 LEFT 10/31/2022 LEFT  11/03/2022  Shoulder extension 60 40 68    Shoulder flexion 165 142 147 162 165  Shoulder abduction 180 74 114 143 163  Shoulder internal rotation 57      Shoulder external rotation 107                              (Blank rows = not tested)     CERVICAL AROM: All within normal limits:          UPPER EXTREMITY STRENGTH: WNL right, Left NT due to sx     LYMPHEDEMA ASSESSMENTS:    LANDMARK RIGHT   Eval 03/31/2022 RIGHT 10/19/2022  10 cm proximal to olecranon process 24.0 22.7  Olecranon process 22.3 21.6  10 cm proximal to ulnar styloid process 20.9 20.4  Just proximal to ulnar styloid process 14.6 14.35  Across hand at thumb web space 19.3 19.0  At base of 2nd digit 5.9 5.6  (Blank rows = not tested)   [facility] LEFT   Eval 03/31/2022 Left 10/19/2022  10 cm proximal to olecranon process 23.6 22.8  Olecranon process 21.6 21.6  10 cm proximal to ulnar styloid process 19.5 18.9  Just proximal to ulnar styloid process  14.15 14.5  Across hand at thumb web space 18.45 18.0  At base of 2nd digit 5.7 5.4  (Blank rows = not  tested)      Surgery type/Date: Left lumpectomy with SLNB Number of lymph nodes removed: 2/14 Current/past treatment (chemo, radiation, hormone therapy): Herceptin, radiation pending, anti estrogens Other symptoms:  Heaviness/tightness Yes Pain Yes Pitting edema No Infections No Decreased scar mobility No Stemmer sign No  TREATMENT PERFORMED TODAY:  11/23/2022 Overhead pulleys x 1:30 flexion, scaption, abduction Soft tissue mobilization left pectorals, UT, lats with cocoa butter MFR to left UE cording at axilla and medial arm and forearm, NTS PROM Left shoulder flexion, scaption, abduction Supine bilateral shoulder AROM flexion, scaption, abduction x 5 ea Standing scapular retraction, extension, bilateral ER yellow x 10  11/14/2022 Overhead pulleys for shoulder flexion and abducton x 2 min Ball roll up wall into flexion and Lt UE abduction x10 Modified downward dog on wall 5x, 5 sec   To supine for PROM to left shoulder into external rotation, flexion and aduction with good stretch  Manual work to left arm with cocoa butter along cording path with longitudinal stretch and S stroke to soft tissue to bend cord. Radiaoplex gel gently to pt axilla and below breast per her request with gentle stationary circles and skin stretch on abdomen and lateral chest to support lymphatic flow.   11/09/22: Therapeutic Exercises Overhead pulleys for shoulder flexion and abducton x 2 min Ball roll up wall into flexion and Lt UE abduction x10 each returning therapist demo Modified downward dog on wall 5x, 5 sec holds returning therapist demo Supine over half roll for bil horz abd and then bil UE "V" into scaption x10 each  Reviewed supine scapular series with yellow theraband x5 each, mild pain reported with narrow grip flexion so encouraged pt to limit ROM to stopping before pain Manual Therapy PROM left shoulder flexion, abduction, and D2 with scapular depression throughout MFR: To Lt axilla and  into medial upper arm to antecubital fossa along cord  11/07/2022  Overhead pulleys for shoulder flexion and abducton x 2 min Supine wand flexion and scaption x 4,  PROM left shoulder flexion, scaption, abduction MFR techniques with longitudinal stretch and S technique to decrease cording Left upper extremity Supine scapular series with yellow band x 5 ea Tried LTR but held as pt did not feel stretching with this today. Updated HEP and gave yellow band   11/03/2022 Sitting: scapular and upper thoracic ROM, Pt noting that she feels tight with thoracic rotation.  Pulley exercises for shoulder flexion, abduction and scaption with cues to feel stretch down into sidebody.  To supine for PROM to left shoulder into external rotation, flexion and aduction with good stretch  and empty end feel. Pt with some stretch in axilla  with cord visible occasional pull into  upper arm. To sidelying for active abduction, flexion and small circles with hand pointed to ceiling and backward thoracic rotation.To supine for lower trunk rotation and bridges.  Sit to stand in 30 sec. Test for 12 reps.       PATIENT EDUCATION:  Education details: supine flexion and stargazer, standing wall slide, sitting or standing scapular retraction. Person educated: Patient Education method: Explanation, Demonstration, and Handouts Education comprehension: verbalized understanding and returned demonstration  HOME EXERCISE PROGRAM: Reviewed previously given post op HEP. Practiced each x 5-7 reps and gave pt new handout secondary to misplacing hers.  ASSESSMENT:  CLINICAL IMPRESSION:  Pt has been sick  for over a week and is still not up to par. Performed soft tissue mobilization, and MFR techniques to cording. She demonstrates good ROM with only small limitation noted with cording. Her arm fatigued quickly on left with AROM.    Pt will benefit from skilled therapeutic intervention to improve on the following deficits:  Decreased knowledge of precautions, impaired UE functional use, pain, decreased ROM, postural dysfunction.   PT treatment/interventions: ADL/Self care home management, Therapeutic exercises, Therapeutic activity, Neuromuscular re-education, Patient/Family education, Self Care, Orthotic/Fit training, scar mobilization, Manual therapy, and Re-evaluation   GOALS: Goals reviewed with patient? Yes  LONG TERM GOALS:  (STG=LTG)  GOALS Name Target Date  Goal status  1 Pt will demonstrate she has regained full shoulder ROM and function post operatively compared to baselines.  Baseline: 11/30/2022 MET  2 Pt will have quick dash no greater than 15% to demonstrate improved function 11/30/2022 INITIAL  3 Pt will have improved pain by 50% or greater 11/30/2022 INITIAL  4 Pt will be fit for a compression sleeve/gauntlet and will be independent in donning and doffing 11/30/2022 INITIAL     PLAN:  PT FREQUENCY/DURATION: 2X/week x 6 weeks  PLAN FOR NEXT SESSION: Review supine scapular series prn. Add standing TB to HEP,Continue pulleys, ball, STM left UQ, AAROM, PROM, MFR to cording, scar mobs ,  update HEP prn,Re-eval next week   Cheral Almas, PT 11/23/22 9:54 AM   [facility]  [address]  [place] [address]  [phone]

## 2022-11-24 ENCOUNTER — Ambulatory Visit
Admission: RE | Admit: 2022-11-24 | Discharge: 2022-11-24 | Disposition: A | Discharge status: Home / Self Care | Payer: BC Managed Care – PPO | Source: Ambulatory Visit | Attending: Radiation Oncology | Admitting: Radiation Oncology

## 2022-11-24 ENCOUNTER — Other Ambulatory Visit: Payer: Self-pay

## 2022-11-24 ENCOUNTER — Inpatient Hospital Stay: Payer: BC Managed Care – PPO

## 2022-11-24 ENCOUNTER — Inpatient Hospital Stay (HOSPITAL_BASED_OUTPATIENT_CLINIC_OR_DEPARTMENT_OTHER): Payer: BC Managed Care – PPO | Admitting: Hematology and Oncology

## 2022-11-24 ENCOUNTER — Other Ambulatory Visit: Payer: Self-pay | Admitting: *Deleted

## 2022-11-24 ENCOUNTER — Telehealth: Payer: Self-pay

## 2022-11-24 VITALS — BP 122/74 | HR 78 | Resp 14

## 2022-11-24 VITALS — BP 149/88 | HR 82 | Temp 97.5°F | Resp 16 | Wt 120.1 lb

## 2022-11-24 DIAGNOSIS — Z5112 Encounter for antineoplastic immunotherapy: Secondary | ICD-10-CM | POA: Insufficient documentation

## 2022-11-24 DIAGNOSIS — Z5181 Encounter for therapeutic drug level monitoring: Secondary | ICD-10-CM

## 2022-11-24 DIAGNOSIS — C50412 Malignant neoplasm of upper-outer quadrant of left female breast: Secondary | ICD-10-CM | POA: Insufficient documentation

## 2022-11-24 DIAGNOSIS — Z170 Estrogen receptor positive status [ER+]: Secondary | ICD-10-CM

## 2022-11-24 DIAGNOSIS — Z95828 Presence of other vascular implants and grafts: Secondary | ICD-10-CM

## 2022-11-24 DIAGNOSIS — Z79811 Long term (current) use of aromatase inhibitors: Secondary | ICD-10-CM | POA: Insufficient documentation

## 2022-11-24 DIAGNOSIS — Z79899 Other long term (current) drug therapy: Secondary | ICD-10-CM | POA: Insufficient documentation

## 2022-11-24 LAB — RAD ONC ARIA SESSION SUMMARY
Course Elapsed Days: 17
Plan Fractions Treated to Date: 12
Plan Fractions Treated to Date: 12
Plan Prescribed Dose Per Fraction: 1.8 Gy
Plan Prescribed Dose Per Fraction: 1.8 Gy
Plan Total Fractions Prescribed: 28
Plan Total Fractions Prescribed: 28
Plan Total Prescribed Dose: 50.4 Gy
Plan Total Prescribed Dose: 50.4 Gy
Reference Point Dosage Given to Date: 21.6 Gy
Reference Point Dosage Given to Date: 21.6 Gy
Reference Point Session Dosage Given: 1.8 Gy
Reference Point Session Dosage Given: 1.8 Gy
Session Number: 12

## 2022-11-24 LAB — CMP (CANCER CENTER ONLY)
ALT: 20 U/L (ref 0–44)
AST: 18 U/L (ref 15–41)
Albumin: 3.6 g/dL (ref 3.5–5.0)
Alkaline Phosphatase: 58 U/L (ref 38–126)
Anion gap: 5 (ref 5–15)
BUN: 6 mg/dL (ref 6–20)
CO2: 28 mmol/L (ref 22–32)
Calcium: 9.2 mg/dL (ref 8.9–10.3)
Chloride: 100 mmol/L (ref 98–111)
Creatinine: 0.45 mg/dL (ref 0.44–1.00)
GFR, Estimated: >60 mL/min (ref >60)
Glucose, Bld: 86 mg/dL (ref 70–99)
Potassium: 3.8 mmol/L (ref 3.5–5.1)
Sodium: 133 mmol/L — ABNORMAL LOW (ref 135–145)
Total Bilirubin: 0.3 mg/dL (ref 0.3–1.2)
Total Protein: 6.7 g/dL (ref 6.5–8.1)

## 2022-11-24 LAB — CBC WITH DIFFERENTIAL (CANCER CENTER ONLY)
Abs Immature Granulocytes: 0.05 10*3/uL (ref 0.00–0.07)
Basophils Absolute: 0.1 10*3/uL (ref 0.0–0.1)
Basophils Relative: 1 %
Eosinophils Absolute: 0.2 10*3/uL (ref 0.0–0.5)
Eosinophils Relative: 4 %
HCT: 34.6 % — ABNORMAL LOW (ref 36.0–46.0)
Hemoglobin: 11.9 g/dL — ABNORMAL LOW (ref 12.0–15.0)
Immature Granulocytes: 1 %
Lymphocytes Relative: 13 %
Lymphs Abs: 0.7 10*3/uL (ref 0.7–4.0)
MCH: 28.5 pg (ref 26.0–34.0)
MCHC: 34.4 g/dL (ref 30.0–36.0)
MCV: 83 fL (ref 80.0–100.0)
Monocytes Absolute: 0.7 10*3/uL (ref 0.1–1.0)
Monocytes Relative: 12 %
Neutro Abs: 3.7 10*3/uL (ref 1.7–7.7)
Neutrophils Relative %: 69 %
Platelet Count: 234 10*3/uL (ref 150–400)
RBC: 4.17 MIL/uL (ref 3.87–5.11)
RDW: 13.3 % (ref 11.5–15.5)
WBC Count: 5.4 10*3/uL (ref 4.0–10.5)
nRBC: 0 % (ref 0.0–0.2)

## 2022-11-24 MED ORDER — SODIUM CHLORIDE 0.9% FLUSH
10.0000 mL | Freq: Once | INTRAVENOUS | Status: AC
  Administered 2022-11-24: 10 mL

## 2022-11-24 MED ORDER — SODIUM CHLORIDE 0.9% FLUSH
10.0000 mL | INTRAVENOUS | Status: DC | PRN
  Administered 2022-11-24: 10 mL

## 2022-11-24 MED ORDER — HEPARIN SOD (PORK) LOCK FLUSH 100 UNIT/ML IV SOLN
500.0000 [IU] | Freq: Once | INTRAVENOUS | Status: AC | PRN
  Administered 2022-11-24: 500 [IU]

## 2022-11-24 MED ORDER — DIPHENHYDRAMINE HCL 25 MG PO CAPS
25.0000 mg | ORAL_CAPSULE | Freq: Once | ORAL | Status: AC
  Administered 2022-11-24: 25 mg via ORAL
  Filled 2022-11-24: qty 1

## 2022-11-24 MED ORDER — PROCHLORPERAZINE MALEATE 10 MG PO TABS
10.0000 mg | ORAL_TABLET | Freq: Once | ORAL | Status: AC
  Administered 2022-11-24: 10 mg via ORAL
  Filled 2022-11-24: qty 1

## 2022-11-24 MED ORDER — SODIUM CHLORIDE 0.9 % IV SOLN: Freq: Once | INTRAVENOUS | Status: AC

## 2022-11-24 MED ORDER — SODIUM CHLORIDE 0.9 % IV SOLN
3.6000 mg/kg | Freq: Once | INTRAVENOUS | Status: AC
  Administered 2022-11-24: 200 mg via INTRAVENOUS
  Filled 2022-11-24: qty 10

## 2022-11-24 NOTE — Assessment & Plan Note (Signed)
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 Neoadjuvant chemotherapy with Herceptin Perjeta and letrozole for 8 cycles.  (Our original recommendation is TCHP x6 cycles but patient is not willing to go through chemo) 2. left lumpectomy and ALND 09/20/2022: 0.6 cm IDC 2/14 lymph nodes, margins negative, ER 95%, PR 0%, HER2 positive 3. Followed by adjuvant radiation therapy started 11/02/2022 4.  Adjuvant antiestrogen therapy with neratinib ------------------------------------------------------------------------------------------ Current treatment: Kadcyla maintenance cycle 3 Kadcyla toxicities: Fatigue Leukopenia: Monitoring closely   Patient's husband has been fasting for over 5 days now and plans to do it for 7 days. Return to clinic in 3 weeks for cycle 4

## 2022-11-24 NOTE — Progress Notes (Signed)
Per Dr. Lindi Adie OK to proceed today with ECHO from 07/31/22 left EF 55-60%.

## 2022-11-24 NOTE — Telephone Encounter (Signed)
Called and LVM regarding ECHO appt. Pt is scheduled for 1/22 at 1000 at [facility]. Gave call back number with any questions.

## 2022-11-24 NOTE — Progress Notes (Signed)
error 

## 2022-11-24 NOTE — Patient Instructions (Signed)
[facility]  Discharge Instructions: Thank you for choosing [facility] to provide your oncology and hematology care.   If you have a lab appointment with the [facility], please go directly to the [facility] and check in at the registration area.   Wear comfortable clothing and clothing appropriate for easy access to any Portacath or PICC line.   We strive to give you quality time with your provider. You may need to reschedule your appointment if you arrive late (15 or more minutes).  Arriving late affects you and other patients whose appointments are after yours.  Also, if you miss three or more appointments without notifying the office, you may be dismissed from the clinic at the provider's discretion.      For prescription refill requests, have your pharmacy contact our office and allow 72 hours for refills to be completed.    Today you received the following chemotherapy and/or immunotherapy agents: Kadcyla      To help prevent nausea and vomiting after your treatment, we encourage you to take your nausea medication as directed.  BELOW ARE SYMPTOMS THAT SHOULD BE REPORTED IMMEDIATELY: *FEVER GREATER THAN 100.4 F (38 C) OR HIGHER *CHILLS OR SWEATING *NAUSEA AND VOMITING THAT IS NOT CONTROLLED WITH YOUR NAUSEA MEDICATION *UNUSUAL SHORTNESS OF BREATH *UNUSUAL BRUISING OR BLEEDING *URINARY PROBLEMS (pain or burning when urinating, or frequent urination) *BOWEL PROBLEMS (unusual diarrhea, constipation, pain near the anus) TENDERNESS IN MOUTH AND THROAT WITH OR WITHOUT PRESENCE OF ULCERS (sore throat, sores in mouth, or a toothache) UNUSUAL RASH, SWELLING OR PAIN  UNUSUAL VAGINAL DISCHARGE OR ITCHING   Items with * indicate a potential emergency and should be followed up as soon as possible or go to the Emergency Department if any problems should occur.  Please show the CHEMOTHERAPY ALERT CARD or IMMUNOTHERAPY ALERT CARD at check-in to  the Emergency Department and triage nurse.  Should you have questions after your visit or need to cancel or reschedule your appointment, please contact [facility]  Dept: [phone]  and follow the prompts.  Office hours are 8:00 a.m. to 4:30 p.m. Monday - Friday. Please note that voicemails left after 4:00 p.m. may not be returned until the following business day.  We are closed weekends and major holidays. You have access to a nurse at all times for urgent questions. Please call the main number to the clinic Dept: [phone] and follow the prompts.   For any non-urgent questions, you may also contact your provider using MyChart. We now offer e-Visits for anyone 19 and older to request care online for non-urgent symptoms. For details visit [email or web address].   Also download the MyChart app! Go to the app store, search "MyChart", open the app, select [facility], and log in with your MyChart username and password.

## 2022-11-26 ENCOUNTER — Telehealth: Payer: BC Managed Care – PPO | Admitting: Physician Assistant

## 2022-11-26 DIAGNOSIS — B9689 Other specified bacterial agents as the cause of diseases classified elsewhere: Secondary | ICD-10-CM

## 2022-11-26 DIAGNOSIS — J028 Acute pharyngitis due to other specified organisms: Secondary | ICD-10-CM | POA: Diagnosis not present

## 2022-11-26 MED ORDER — AMOXICILLIN 500 MG PO CAPS: 500.0000 mg | ORAL_CAPSULE | Freq: Two times a day (BID) | ORAL | 0 refills | Status: AC

## 2022-11-26 MED ORDER — LIDOCAINE VISCOUS HCL 2 % MT SOLN: OROMUCOSAL | 0 refills | Status: DC

## 2022-11-26 NOTE — Patient Instructions (Signed)
Marius Ditch, thank you for joining Mar Daring, PA-C for today's virtual visit.  While this provider is not your primary care provider (PCP), if your PCP is located in our provider database this encounter information will be shared with them immediately following your visit.   [facility] account gives you access to today's visit and all your visits, tests, and labs performed at [facility] " click here if you don't have a [facility] account or go to [email or web address]  Consent: (Patient) Caitlyn Torres provided verbal consent for this virtual visit at the beginning of the encounter.  Current Medications:  Current Outpatient Medications:    amoxicillin (AMOXIL) 500 MG capsule, Take 1 capsule (500 mg total) by mouth 2 (two) times daily for 10 days., Disp: 20 capsule, Rfl: 0   lidocaine (XYLOCAINE) 2 % solution, Swish and swallow 7m every 4 hours as needed for sore throat, Disp: 100 mL, Rfl: 0   benzonatate (TESSALON) 100 MG capsule, Take 1 capsule (100 mg total) by mouth 3 (three) times daily as needed., Disp: 30 capsule, Rfl: 0   Brimonidine Tartrate (LUMIFY) 0.025 % SOLN, Place 1 drop into both eyes daily as needed (red eyes)., Disp: , Rfl:    carboxymethylcellulose (REFRESH PLUS) 0.5 % SOLN, Place 1 drop into both eyes 3 (three) times daily as needed (dry eyes)., Disp: , Rfl:    cetirizine (ZYRTEC) 10 MG tablet, Take 10 mg by mouth daily., Disp: , Rfl:    fluticasone (FLONASE) 50 MCG/ACT nasal spray, Place 2 sprays into both nostrils daily., Disp: 16 g, Rfl: 0   Lidocaine-Prilocaine &Lido HCl 2.5-2.5 & 3.88 % KIT, APPLY TO AFFECTED AREA ONCE AS DIRECTED, Disp: , Rfl:    magic mouthwash w/lidocaine SOLN, Take 5 mLs by mouth 4 (four) times daily as needed for mouth pain., Disp: 240 mL, Rfl: 1   Multiple Vitamins-Iron (CHLORELLA) CAPS, Take 1 capsule by mouth daily., Disp: , Rfl:    ondansetron (ZOFRAN) 8 MG tablet, Take 1 tablet (8 mg  total) by mouth every 8 (eight) hours as needed for nausea or vomiting., Disp: 30 tablet, Rfl: 1   OVER THE COUNTER MEDICATION, Take 1 capsule by mouth daily. Amla, Disp: , Rfl:    OVER THE COUNTER MEDICATION, Take 1 capsule by mouth daily. Mushroom Complex, Disp: , Rfl:    OVER THE COUNTER MEDICATION, Place 1 Application vaginally daily as needed (moisture). Femininity, Disp: , Rfl:    QUERCETIN PO, Take 1 capsule by mouth daily., Disp: , Rfl:    sodium chloride (OCEAN) 0.65 % SOLN nasal spray, Place 1 spray into both nostrils as needed for congestion., Disp: , Rfl:    trimethoprim-polymyxin b (POLYTRIM) ophthalmic solution, Place 1 drop into the right eye every 4 (four) hours. X 5 days, Disp: 10 mL, Rfl: 0   TURMERIC CURCUMIN PO, Take 1 capsule by mouth daily., Disp: , Rfl:    Medications ordered in this encounter:  Meds ordered this encounter  Medications   amoxicillin (AMOXIL) 500 MG capsule    Sig: Take 1 capsule (500 mg total) by mouth 2 (two) times daily for 10 days.    Dispense:  20 capsule    Refill:  0    Order Specific Question:   Supervising Provider    Answer:   LChase Picket[[[id number]]  lidocaine (XYLOCAINE) 2 % solution    Sig: Swish and swallow 540mevery 4 hours as needed for sore throat  Dispense:  100 mL    Refill:  0    Order Specific Question:   Supervising Provider    Answer:   Chase Picket [[id number]]     *If you need refills on other medications prior to your next appointment, please contact your pharmacy*  Follow-Up: Call back or seek an in-person evaluation if the symptoms worsen or if the condition fails to improve as anticipated.  [place] [phone]  Other Instructions  Pharyngitis  Pharyngitis is inflammation of the throat (pharynx). It is a very common cause of sore throat. Pharyngitis can be caused by a bacteria, but it is usually caused by a virus. Most cases of pharyngitis get better on their own without  treatment. What are the causes? This condition may be caused by: Infection by viruses (viral). Viral pharyngitis spreads easily from person to person (is contagious) through coughing, sneezing, and sharing of personal items or utensils such as cups, forks, spoons, and toothbrushes. Infection by bacteria (bacterial). Bacterial pharyngitis may be spread by touching the nose or face after coming in contact with the bacteria, or through close contact, such as kissing. Allergies. Allergies can cause buildup of mucus in the throat (post-nasal drip), leading to inflammation and irritation. Allergies can also cause blocked nasal passages, forcing breathing through the mouth, which dries and irritates the throat. What increases the risk? You are more likely to develop this condition if: You are 79-60 years old. You are exposed to crowded environments such as daycare, school, or dormitory living. You live in a cold climate. You have a weakened disease-fighting (immune) system. What are the signs or symptoms? Symptoms of this condition vary by the cause. Common symptoms of this condition include: Sore throat. Fatigue. Low-grade fever. Stuffy nose (nasal congestion) and cough. Headache. Other symptoms may include: Glands in the neck (lymph nodes) that are swollen. Skin rashes. Plaque-like film on the throat or tonsils. This is often a symptom of bacterial pharyngitis. Vomiting. Red, itchy eyes (conjunctivitis). Loss of appetite. Joint pain and muscle aches. Enlarged tonsils. How is this diagnosed? This condition may be diagnosed based on your medical history and a physical exam. Your health care provider will ask you questions about your illness and your symptoms. A swab of your throat may be done to check for bacteria (rapid strep test). Other lab tests may also be done, depending on the suspected cause, but these are rare. How is this treated? Many times, treatment is not needed for this  condition. Pharyngitis usually gets better in 3-4 days without treatment. Bacterial pharyngitis may be treated with antibiotic medicines. Follow these instructions at home: Medicines Take over-the-counter and prescription medicines only as told by your health care provider. If you were prescribed an antibiotic medicine, take it as told by your health care provider. Do not stop taking the antibiotic even if you start to feel better. Use throat sprays to soothe your throat as told by your health care provider. Children can get pharyngitis. Do not give your child aspirin because of the association with Reye's syndrome. Managing pain To help with pain, try: Sipping warm liquids, such as broth, herbal tea, or warm water. Eating or drinking cold or frozen liquids, such as frozen ice pops. Gargling with a mixture of salt and water 3-4 times a day or as needed. To make salt water, completely dissolve -1 tsp (3-6 g) of salt in 1 cup (237 mL) of warm water. Sucking on hard candy or throat lozenges. Putting  a cool-mist humidifier in your bedroom at night to moisten the air. Sitting in the bathroom with the door closed for 5-10 minutes while you run hot water in the shower.  General instructions  Do not use any products that contain nicotine or tobacco. These products include cigarettes, chewing tobacco, and vaping devices, such as e-cigarettes. If you need help quitting, ask your health care provider. Rest as told by your health care provider. Drink enough fluid to keep your urine pale yellow. How is this prevented? To help prevent becoming infected or spreading infection: Wash your hands often with soap and water for at least 20 seconds. If soap and water are not available, use hand sanitizer. Do not touch your eyes, nose, or mouth with unwashed hands, and wash hands after touching these areas. Do not share cups or eating utensils. Avoid close contact with people who are sick. Contact a health  care provider if: You have large, tender lumps in your neck. You have a rash. You cough up green, yellow-brown, or bloody mucus. Get help right away if: Your neck becomes stiff. You drool or are unable to swallow liquids. You cannot drink or take medicines without vomiting. You have severe pain that does not go away, even after you take medicine. You have trouble breathing, and it is not caused by a stuffy nose. You have new pain and swelling in your joints such as the knees, ankles, wrists, or elbows. These symptoms may represent a serious problem that is an emergency. Do not wait to see if the symptoms will go away. Get medical help right away. Call your local emergency services (911 in the [place] Do not drive yourself to the hospital. Summary Pharyngitis is redness, pain, and swelling (inflammation) of the throat (pharynx). While pharyngitis can be caused by a bacteria, the most common causes are viral. Most cases of pharyngitis get better on their own without treatment. Bacterial pharyngitis is treated with antibiotic medicines. This information is not intended to replace advice given to you by your health care provider. Make sure you discuss any questions you have with your health care provider. Document Revised: 01/26/2021 Document Reviewed: 01/26/2021 Elsevier Patient Education  [place].    If you have been instructed to have an in-person evaluation today at a local Urgent Care facility, please use the link below. It will take you to a list of all of our available [facility], including address, phone number and hours of operation. Please do not delay care.  [facility]  If you or a family member do not have a primary care provider, use the link below to schedule a visit and establish care. When you choose a [facility] primary care physician or advanced practice provider, you gain a long-term partner in health. Find a Primary Care  Provider  Learn more about [facility]'s in-office and virtual care options: [facility]

## 2022-11-26 NOTE — Progress Notes (Signed)
Virtual Visit Consent   Caitlyn Torres, you are scheduled for a virtual visit with a [facility] provider today. Just as with appointments in the office, your consent must be obtained to participate. Your consent will be active for this visit and any virtual visit you may have with one of our providers in the next 365 days. If you have a MyChart account, a copy of this consent can be sent to you electronically.  As this is a virtual visit, video technology does not allow for your provider to perform a traditional examination. This may limit your provider's ability to fully assess your condition. If your provider identifies any concerns that need to be evaluated in person or the need to arrange testing (such as labs, EKG, etc.), we will make arrangements to do so. Although advances in technology are sophisticated, we cannot ensure that it will always work on either your end or our end. If the connection with a video visit is poor, the visit may have to be switched to a telephone visit. With either a video or telephone visit, we are not always able to ensure that we have a secure connection.  By engaging in this virtual visit, you consent to the provision of healthcare and authorize for your insurance to be billed (if applicable) for the services provided during this visit. Depending on your insurance coverage, you may receive a charge related to this service.  I need to obtain your verbal consent now. Are you willing to proceed with your visit today? Caitlyn Torres has provided verbal consent on 11/26/2022 for a virtual visit (video or telephone). Mar Daring, PA-C  Date: 11/26/2022 10:21 AM  Virtual Visit via Video Note   I, Mar Daring, connected with  Caitlyn Torres  ([id number], Jun 24, 1961) on 11/26/22 at 10:00 AM EST by a video-enabled telemedicine application and verified that I am speaking with the correct person using two identifiers.  Location: Patient: Virtual  Visit Location Patient: Home Provider: Virtual Visit Location Provider: Home Office   I discussed the limitations of evaluation and management by telemedicine and the availability of in person appointments. The patient expressed understanding and agreed to proceed.    History of Present Illness: Caitlyn Torres is a 62 y.o. who identifies as a female who was assigned female at birth, and is being seen today for sore throat.  HPI: Sore Throat  This is a new problem. The current episode started 1 to 4 weeks ago (11/15/22). The problem has been gradually worsening. Maximum temperature: had fever in beginning, but none recently. The fever has been present for 3 to 4 days. Associated symptoms include congestion (improving), coughing (improving), headaches, a hoarse voice, swollen glands and trouble swallowing. Pertinent negatives include no ear pain. Associated symptoms comments: Had pink eye and laryngitis (1/8-11/21/22). She has tried acetaminophen (tamiflu, tylenol, OTC medications for cough and cold, oxycodone) for the symptoms. The treatment provided no relief.     Problems:  Patient Active Problem List   Diagnosis Date Noted   Other fatigue 08/28/2022   Acute pain 08/28/2022   Genetic testing 06/01/2022   Family history of breast cancer 05/15/2022   Family history of pancreatic cancer 05/15/2022   Family history of prostate cancer 05/15/2022   Port-A-Cath in place 04/28/2022   Malignant neoplasm of upper-outer quadrant of left breast in female, estrogen receptor positive ([facility]) 03/29/2022    Allergies:  Allergies  Allergen Reactions   Aleve [Naproxen Sodium] Hives and Swelling  Swelling of the face   Aspirin Hives and Swelling   Macrobid [Nitrofurantoin]     High Fever   Motrin [Ibuprofen] Hives and Swelling    Swelling of the face   Tylenol [Acetaminophen] Hives and Swelling    Swelling of face    Medications:  Current Outpatient Medications:    amoxicillin (AMOXIL) 500  MG capsule, Take 1 capsule (500 mg total) by mouth 2 (two) times daily for 10 days., Disp: 20 capsule, Rfl: 0   lidocaine (XYLOCAINE) 2 % solution, Swish and swallow 43m every 4 hours as needed for sore throat, Disp: 100 mL, Rfl: 0   benzonatate (TESSALON) 100 MG capsule, Take 1 capsule (100 mg total) by mouth 3 (three) times daily as needed., Disp: 30 capsule, Rfl: 0   Brimonidine Tartrate (LUMIFY) 0.025 % SOLN, Place 1 drop into both eyes daily as needed (red eyes)., Disp: , Rfl:    carboxymethylcellulose (REFRESH PLUS) 0.5 % SOLN, Place 1 drop into both eyes 3 (three) times daily as needed (dry eyes)., Disp: , Rfl:    cetirizine (ZYRTEC) 10 MG tablet, Take 10 mg by mouth daily., Disp: , Rfl:    fluticasone (FLONASE) 50 MCG/ACT nasal spray, Place 2 sprays into both nostrils daily., Disp: 16 g, Rfl: 0   Lidocaine-Prilocaine &Lido HCl 2.5-2.5 & 3.88 % KIT, APPLY TO AFFECTED AREA ONCE AS DIRECTED, Disp: , Rfl:    magic mouthwash w/lidocaine SOLN, Take 5 mLs by mouth 4 (four) times daily as needed for mouth pain., Disp: 240 mL, Rfl: 1   Multiple Vitamins-Iron (CHLORELLA) CAPS, Take 1 capsule by mouth daily., Disp: , Rfl:    ondansetron (ZOFRAN) 8 MG tablet, Take 1 tablet (8 mg total) by mouth every 8 (eight) hours as needed for nausea or vomiting., Disp: 30 tablet, Rfl: 1   OVER THE COUNTER MEDICATION, Take 1 capsule by mouth daily. Amla, Disp: , Rfl:    OVER THE COUNTER MEDICATION, Take 1 capsule by mouth daily. Mushroom Complex, Disp: , Rfl:    OVER THE COUNTER MEDICATION, Place 1 Application vaginally daily as needed (moisture). Femininity, Disp: , Rfl:    QUERCETIN PO, Take 1 capsule by mouth daily., Disp: , Rfl:    sodium chloride (OCEAN) 0.65 % SOLN nasal spray, Place 1 spray into both nostrils as needed for congestion., Disp: , Rfl:    trimethoprim-polymyxin b (POLYTRIM) ophthalmic solution, Place 1 drop into the right eye every 4 (four) hours. X 5 days, Disp: 10 mL, Rfl: 0   TURMERIC CURCUMIN  PO, Take 1 capsule by mouth daily., Disp: , Rfl:   Observations/Objective: Patient is well-developed, well-nourished in no acute distress.  Resting comfortably at home.  Head is normocephalic, atraumatic.  No labored breathing.  Speech is clear and coherent with logical content.  Patient is alert and oriented at baseline.    Assessment and Plan: 1. Acute bacterial pharyngitis - amoxicillin (AMOXIL) 500 MG capsule; Take 1 capsule (500 mg total) by mouth 2 (two) times daily for 10 days.  Dispense: 20 capsule; Refill: 0 - lidocaine (XYLOCAINE) 2 % solution; Swish and swallow 517mevery 4 hours as needed for sore throat  Dispense: 100 mL; Refill: 0  - Suspect bacterial pharyngitis following possible parainfluenza infection - Amoxicillin prescribed - Viscous lidocaine for throat pain - Salt water gargles - Chloraseptic spray - Liquid and soft food diet - Push fluids - New toothbrush in 3 days - Seek in person evaluation if not improving or if symptoms worsen  Follow Up Instructions: I discussed the assessment and treatment plan with the patient. The patient was provided an opportunity to ask questions and all were answered. The patient agreed with the plan and demonstrated an understanding of the instructions.  A copy of instructions were sent to the patient via MyChart unless otherwise noted below.    The patient was advised to call back or seek an in-person evaluation if the symptoms worsen or if the condition fails to improve as anticipated.  Time:  I spent 13 minutes with the patient via telehealth technology discussing the above problems/concerns.    Mar Daring, PA-C

## 2022-11-27 ENCOUNTER — Ambulatory Visit
Admission: RE | Admit: 2022-11-27 | Discharge: 2022-11-27 | Disposition: A | Discharge status: Home / Self Care | Payer: BC Managed Care – PPO | Source: Ambulatory Visit | Attending: Radiation Oncology | Admitting: Radiation Oncology

## 2022-11-27 ENCOUNTER — Other Ambulatory Visit: Payer: Self-pay

## 2022-11-27 ENCOUNTER — Telehealth: Payer: Self-pay | Admitting: Hematology and Oncology

## 2022-11-27 DIAGNOSIS — C50412 Malignant neoplasm of upper-outer quadrant of left female breast: Secondary | ICD-10-CM | POA: Diagnosis not present

## 2022-11-27 LAB — RAD ONC ARIA SESSION SUMMARY
Course Elapsed Days: 20
Plan Fractions Treated to Date: 13
Plan Fractions Treated to Date: 13
Plan Prescribed Dose Per Fraction: 1.8 Gy
Plan Prescribed Dose Per Fraction: 1.8 Gy
Plan Total Fractions Prescribed: 28
Plan Total Fractions Prescribed: 28
Plan Total Prescribed Dose: 50.4 Gy
Plan Total Prescribed Dose: 50.4 Gy
Reference Point Dosage Given to Date: 23.4 Gy
Reference Point Dosage Given to Date: 23.4 Gy
Reference Point Session Dosage Given: 1.8 Gy
Reference Point Session Dosage Given: 1.8 Gy
Session Number: 13

## 2022-11-27 NOTE — Telephone Encounter (Signed)
Scheduled appointments per WQ. Patient is aware of all made appointments. 

## 2022-11-28 ENCOUNTER — Ambulatory Visit: Payer: BC Managed Care – PPO

## 2022-11-28 ENCOUNTER — Other Ambulatory Visit: Payer: Self-pay

## 2022-11-28 ENCOUNTER — Ambulatory Visit
Admission: RE | Admit: 2022-11-28 | Discharge: 2022-11-28 | Disposition: A | Discharge status: Home / Self Care | Payer: BC Managed Care – PPO | Source: Ambulatory Visit | Attending: Radiation Oncology | Admitting: Radiation Oncology

## 2022-11-28 DIAGNOSIS — M25612 Stiffness of left shoulder, not elsewhere classified: Secondary | ICD-10-CM | POA: Diagnosis not present

## 2022-11-28 DIAGNOSIS — C50412 Malignant neoplasm of upper-outer quadrant of left female breast: Secondary | ICD-10-CM | POA: Diagnosis not present

## 2022-11-28 DIAGNOSIS — R293 Abnormal posture: Secondary | ICD-10-CM

## 2022-11-28 LAB — RAD ONC ARIA SESSION SUMMARY
Course Elapsed Days: 21
Plan Fractions Treated to Date: 14
Plan Fractions Treated to Date: 14
Plan Prescribed Dose Per Fraction: 1.8 Gy
Plan Prescribed Dose Per Fraction: 1.8 Gy
Plan Total Fractions Prescribed: 28
Plan Total Fractions Prescribed: 28
Plan Total Prescribed Dose: 50.4 Gy
Plan Total Prescribed Dose: 50.4 Gy
Reference Point Dosage Given to Date: 25.2 Gy
Reference Point Dosage Given to Date: 25.2 Gy
Reference Point Session Dosage Given: 1.8 Gy
Reference Point Session Dosage Given: 1.8 Gy
Session Number: 14

## 2022-11-28 NOTE — Therapy (Signed)
OUTPATIENT PHYSICAL THERAPY BREAST CANCER TREATMENT   Patient Name: Caitlyn Torres MRN: [id number] DOB:1961-11-01, 62 y.o., female Today's Date: 11/28/2022  END OF SESSION:  PT End of Session - 11/28/22 0907     Visit Number 10    Number of Visits 12    Date for PT Re-Evaluation 11/30/22    PT Start Time 0907    PT Stop Time 0957    PT Time Calculation (min) 50 min    Activity Tolerance Patient tolerated treatment well    Behavior During Therapy [facility] for tasks assessed/performed               Past Medical History:  Diagnosis Date   Anxiety    Arthritis    Cancer ([place]) 03/2022   L breast   Depression    Family history of breast cancer 05/15/2022   Family history of pancreatic cancer 05/15/2022   Family history of prostate cancer 05/15/2022   GERD (gastroesophageal reflux disease)    History of hiatal hernia    patient believes she may have been told she has this years ago   Past Surgical History:  Procedure Laterality Date   BREAST BIOPSY  09/19/2022   MM LT RADIOACTIVE SEED EA ADD LESION LOC MAMMO GUIDE 09/19/2022 [facility]AP[facility]   BREAST BIOPSY  09/19/2022   MM LT RADIOACTIVE SEED LOC MAMMO GUIDE 09/19/2022 [facility]   BREAST LUMPECTOMY WITH RADIOACTIVE SEED AND AXILLARY LYMPH NODE DISSECTION Left 09/20/2022   Procedure: LEFT BREAST SEED LUMPECTOMY, LEFT AXILLARY LYMPH NODE DISSECTION;  Surgeon: Erroll Luna, MD;  Location: [place];  Service: General;  Laterality: Left;  GEN & PEC BLOCK   CERVIX SURGERY     Laser removal of pre-cancer cells   LASIK Bilateral    PORTACATH PLACEMENT Right 04/20/2022   Procedure: PORT PLACEMENT;  Surgeon: Erroll Luna, MD;  Location: [place];  Service: General;  Laterality: Right;   Patient Active Problem List   Diagnosis Date Noted   Other fatigue 08/28/2022   Acute pain 08/28/2022   Genetic testing 06/01/2022   Family history of breast cancer 05/15/2022   Family history of pancreatic cancer 05/15/2022   Family  history of prostate cancer 05/15/2022   Port-A-Cath in place 04/28/2022   Malignant neoplasm of upper-outer quadrant of left breast in female, estrogen receptor positive ([place]) 03/29/2022    PCP: Sidney Ace, MD  REFERRING PROVIDER: Nicholas Lose, MD  REFERRING DIAG: Left Breast Cancer  THERAPY DIAG:  Stiffness of L shoulder Stiffness of left shoulder, not elsewhere classified  Abnormal posture  Malignant neoplasm of upper-outer quadrant of left female breast, unspecified estrogen receptor status ([place])  Rationale for Evaluation and Treatment: Rehabilitation  ONSET DATE: 03/14/2022  SUBJECTIVE:  SUBJECTIVE STATEMENT:   I started feeling better after taking the antibiotics. I haven't done a lot yet still. Still resting some. Went shopping yesterday and then had to come home and rest. My skin is itching really bad. I have radiation at 3 today. I have to pick up my sleeve on Thursday,   PERTINENT HISTORY:  03/14/2022:Screening detected left breast masses 1.5 cm and 1.3 cm with enlarged left axillary lymph node, biopsy revealed grade 3 IDC with lymph node being positive, ER 95%, PR 0%, HER2 2+ by IHC and FISH positive with a ratio 2.45 and a copy #4.9, the second biopsy was HER2 negative with a ratio  1. Neoadjuvant chemotherapy with Herceptin Perjeta and letrozole for 8 cycles.  (Our original recommendation is TCHP x6 cycles but patient is not willing to go through chemo) 2. left lumpectomy and ALND 09/20/2022: 0.6 cm IDC 2/14 lymph nodes, margins negative, ER 95%, PR 0%, HER2 positive 3. Followed by adjuvant radiation therapy  4.  Adjuvant antiestrogen therapy with neratinib She had left lumpectomy on 09/20/2022 with 2/14 LN's  PATIENT GOALS:  Reassess how my recovery is going related to arm function, pain,  and swelling.  PAIN:  Are you having pain? 0/10  PRECAUTIONS: Recent Surgery, left UE Lymphedema risk,   ACTIVITY LEVEL / LEISURE: not yet,   OBJECTIVE:   PATIENT SURVEYS:   11/03/2022:   30 second sit to stand 12 reps with 1/10 RPE  pt felt she could do more  QUICK DASH: 34.09  OBSERVATIONS: Incisions healed with glue still present both incisions. Mild firmness under breast incision and foam pad made to place over it in bra. Several cords visible and palpable in axillary region and running to medial elbow.  POSTURE:  Forward head rounded shoulders  LYMPHEDEMA ASSESSMENT:   UPPER EXTREMITY AROM/PROM:   A/PROM RIGHT   eval    Shoulder extension 47  Shoulder flexion 163  Shoulder abduction 180  Shoulder internal rotation 67  Shoulder external rotation 107                          (Blank rows = not tested)   A/PROM LEFT   eval LEFT 10/19/2022 LEFT 10/24/22 LEFT 10/31/2022 LEFT  11/03/2022  Shoulder extension 60 40 68    Shoulder flexion 165 142 147 162 165  Shoulder abduction 180 74 114 143 163  Shoulder internal rotation 57      Shoulder external rotation 107                              (Blank rows = not tested)     CERVICAL AROM: All within normal limits:          UPPER EXTREMITY STRENGTH: WNL right, Left NT due to sx     LYMPHEDEMA ASSESSMENTS:    LANDMARK RIGHT   Eval 03/31/2022 RIGHT 10/19/2022  10 cm proximal to olecranon process 24.0 22.7  Olecranon process 22.3 21.6  10 cm proximal to ulnar styloid process 20.9 20.4  Just proximal to ulnar styloid process 14.6 14.35  Across hand at thumb web space 19.3 19.0  At base of 2nd digit 5.9 5.6  (Blank rows = not tested)   [facility] LEFT   Eval 03/31/2022 Left 10/19/2022  10 cm proximal to olecranon process 23.6 22.8  Olecranon process 21.6 21.6  10 cm proximal to ulnar styloid process 19.5 18.9  Just  proximal to ulnar styloid process 14.15 14.5  Across hand at thumb web space 18.45 18.0  At  base of 2nd digit 5.7 5.4  (Blank rows = not tested)      Surgery type/Date: Left lumpectomy with SLNB Number of lymph nodes removed: 2/14 Current/past treatment (chemo, radiation, hormone therapy): Herceptin, radiation pending, anti estrogens Other symptoms:  Heaviness/tightness Yes Pain Yes Pitting edema No Infections No Decreased scar mobility No Stemmer sign No  TREATMENT PERFORMED TODAY:  11/28/2022 Pulleys x 1:30 flexion and abduction Soft tissue mobilization left , UT, lats  and SL scapular region  bilaterally with cocoa butter, avoiding radiated areas MFR to left UE cording at axilla and medial arm and forearm, NTS PROM Left shoulder flexion, scaption, abduction Supine Bilateral shoulder AROM Flexion x 10, scaption x 5, horizontal abd x 5 Standing scapular retraciton, shoulder extension,bilateral ER x 10  11/23/2022 Overhead pulleys x 1:30 flexion, scaption, abduction Soft tissue mobilization left pectorals, UT, lats with cocoa butter MFR to left UE cording at axilla and medial arm and forearm, NTS PROM Left shoulder flexion, scaption, abduction Supine bilateral shoulder AROM flexion, scaption, abduction x 5 ea Standing scapular retraction, extension, bilateral ER yellow x 10  11/14/2022 Overhead pulleys for shoulder flexion and abducton x 2 min Ball roll up wall into flexion and Lt UE abduction x10 Modified downward dog on wall 5x, 5 sec   To supine for PROM to left shoulder into external rotation, flexion and aduction with good stretch  Manual work to left arm with cocoa butter along cording path with longitudinal stretch and S stroke to soft tissue to bend cord. Radiaoplex gel gently to pt axilla and below breast per her request with gentle stationary circles and skin stretch on abdomen and lateral chest to support lymphatic flow.   11/09/22: Therapeutic Exercises Overhead pulleys for shoulder flexion and abducton x 2 min Ball roll up wall into flexion and Lt UE  abduction x10 each returning therapist demo Modified downward dog on wall 5x, 5 sec holds returning therapist demo Supine over half roll for bil horz abd and then bil UE "V" into scaption x10 each  Reviewed supine scapular series with yellow theraband x5 each, mild pain reported with narrow grip flexion so encouraged pt to limit ROM to stopping before pain Manual Therapy PROM left shoulder flexion, abduction, and D2 with scapular depression throughout MFR: To Lt axilla and into medial upper arm to antecubital fossa along cord  11/07/2022  Overhead pulleys for shoulder flexion and abducton x 2 min Supine wand flexion and scaption x 4,  PROM left shoulder flexion, scaption, abduction MFR techniques with longitudinal stretch and S technique to decrease cording Left upper extremity Supine scapular series with yellow band x 5 ea Tried LTR but held as pt did not feel stretching with this today. Updated HEP and gave yellow band   11/03/2022 Sitting: scapular and upper thoracic ROM, Pt noting that she feels tight with thoracic rotation.  Pulley exercises for shoulder flexion, abduction and scaption with cues to feel stretch down into sidebody.  To supine for PROM to left shoulder into external rotation, flexion and aduction with good stretch  and empty end feel. Pt with some stretch in axilla  with cord visible occasional pull into  upper arm. To sidelying for active abduction, flexion and small circles with hand pointed to ceiling and backward thoracic rotation.To supine for lower trunk rotation and bridges.  Sit to stand in 30 sec. Test for 12  reps.       PATIENT EDUCATION:  Access Code: [email or web address] URL: [email or web address] Date: 11/28/2022 Prepared by: Cheral Almas  Exercises - Scapular Retraction with Resistance  - 1 x daily - 2-3 x weekly - 1 sets - 10 reps - Scapular Retraction with Resistance Advanced  - 1 x daily - 2-3 x weekly - 1 sets - 10 reps - Shoulder External  Rotation and Scapular Retraction with Resistance  - 1 x daily - 2-3 x weekly - 10 reps Education details: supine flexion and stargazer, standing wall slide, sitting or standing scapular retraction. Person educated: Patient Education method: Explanation, Demonstration, and Handouts Education comprehension: verbalized understanding and returned demonstration  HOME EXERCISE PROGRAM: Reviewed previously given post op HEP. Practiced each x 5-7 reps and gave pt new handout secondary to misplacing hers.  ASSESSMENT:  CLINICAL IMPRESSION:   Continued manual techniques to cording, STM to areas of tightness, and exercises for ROM. Pt requires occasional VC's to depress scapula to prevent mild pain in left shoulder. Cording is still present but not limiting AROM. Her skin is getting red and itchy with radiation so avoided radiation areas today.  Pt will benefit from skilled therapeutic intervention to improve on the following deficits: Decreased knowledge of precautions, impaired UE functional use, pain, decreased ROM, postural dysfunction.   PT treatment/interventions: ADL/Self care home management, Therapeutic exercises, Therapeutic activity, Neuromuscular re-education, Patient/Family education, Self Care, Orthotic/Fit training, scar mobilization, Manual therapy, and Re-evaluation   GOALS: Goals reviewed with patient? Yes  LONG TERM GOALS:  (STG=LTG)  GOALS Name Target Date  Goal status  1 Pt will demonstrate she has regained full shoulder ROM and function post operatively compared to baselines.  Baseline: 11/30/2022 MET  2 Pt will have quick dash no greater than 15% to demonstrate improved function 11/30/2022 INITIAL  3 Pt will have improved pain by 50% or greater 11/30/2022 MET  4 Pt will be fit for a compression sleeve/gauntlet and will be independent in donning and doffing 11/30/2022 INITIAL     PLAN:  PT FREQUENCY/DURATION: 2X/week x 6 weeks  PLAN FOR NEXT SESSION: RECERT/DC check  sleeve, quick dash,Review supine scapular series prn. Add standing TB to HEP,Continue pulleys, ball, STM left UQ, AAROM, PROM, MFR to cording, scar mobs ,  update HEP prn,   Cheral Almas, PT 11/28/22 10:04 AM   [facility]  [address] [place]  [place] [address]  [phone]

## 2022-11-29 ENCOUNTER — Ambulatory Visit: Payer: BC Managed Care – PPO | Admitting: Radiation Oncology

## 2022-11-29 ENCOUNTER — Ambulatory Visit: Payer: BC Managed Care – PPO

## 2022-11-29 ENCOUNTER — Other Ambulatory Visit: Payer: Self-pay

## 2022-11-29 ENCOUNTER — Ambulatory Visit
Admission: RE | Admit: 2022-11-29 | Discharge: 2022-11-29 | Disposition: A | Discharge status: Home / Self Care | Payer: BC Managed Care – PPO | Source: Ambulatory Visit | Attending: Radiation Oncology | Admitting: Radiation Oncology

## 2022-11-29 DIAGNOSIS — C50412 Malignant neoplasm of upper-outer quadrant of left female breast: Secondary | ICD-10-CM | POA: Diagnosis not present

## 2022-11-29 LAB — RAD ONC ARIA SESSION SUMMARY
Course Elapsed Days: 22
Plan Fractions Treated to Date: 15
Plan Fractions Treated to Date: 15
Plan Prescribed Dose Per Fraction: 1.8 Gy
Plan Prescribed Dose Per Fraction: 1.8 Gy
Plan Total Fractions Prescribed: 28
Plan Total Fractions Prescribed: 28
Plan Total Prescribed Dose: 50.4 Gy
Plan Total Prescribed Dose: 50.4 Gy
Reference Point Dosage Given to Date: 27 Gy
Reference Point Dosage Given to Date: 27 Gy
Reference Point Session Dosage Given: 1.8 Gy
Reference Point Session Dosage Given: 1.8 Gy
Session Number: 15

## 2022-11-30 ENCOUNTER — Ambulatory Visit
Admission: RE | Admit: 2022-11-30 | Discharge: 2022-11-30 | Disposition: A | Discharge status: Home / Self Care | Payer: BC Managed Care – PPO | Source: Ambulatory Visit | Attending: Radiation Oncology | Admitting: Radiation Oncology

## 2022-11-30 ENCOUNTER — Other Ambulatory Visit: Payer: Self-pay

## 2022-11-30 ENCOUNTER — Ambulatory Visit: Payer: BC Managed Care – PPO

## 2022-11-30 DIAGNOSIS — R293 Abnormal posture: Secondary | ICD-10-CM

## 2022-11-30 DIAGNOSIS — C50412 Malignant neoplasm of upper-outer quadrant of left female breast: Secondary | ICD-10-CM | POA: Diagnosis not present

## 2022-11-30 DIAGNOSIS — M25612 Stiffness of left shoulder, not elsewhere classified: Secondary | ICD-10-CM | POA: Diagnosis not present

## 2022-11-30 LAB — RAD ONC ARIA SESSION SUMMARY
Course Elapsed Days: 23
Plan Fractions Treated to Date: 16
Plan Fractions Treated to Date: 16
Plan Prescribed Dose Per Fraction: 1.8 Gy
Plan Prescribed Dose Per Fraction: 1.8 Gy
Plan Total Fractions Prescribed: 28
Plan Total Fractions Prescribed: 28
Plan Total Prescribed Dose: 50.4 Gy
Plan Total Prescribed Dose: 50.4 Gy
Reference Point Dosage Given to Date: 28.8 Gy
Reference Point Dosage Given to Date: 28.8 Gy
Reference Point Session Dosage Given: 1.8 Gy
Reference Point Session Dosage Given: 1.8 Gy
Session Number: 16

## 2022-11-30 NOTE — Therapy (Signed)
OUTPATIENT PHYSICAL THERAPY BREAST CANCER TREATMENT   Patient Name: Caitlyn Torres MRN: [id number] DOB:1961-09-03, 62 y.o., female Today's Date: 11/30/2022  END OF SESSION:  PT End of Session - 11/30/22 0906     Visit Number 11    Number of Visits 15    Date for PT Re-Evaluation 12/28/22    PT Start Time 0906    PT Stop Time 0950    PT Time Calculation (min) 44 min    Activity Tolerance Patient tolerated treatment well    Behavior During Therapy [facility] for tasks assessed/performed               Past Medical History:  Diagnosis Date   Anxiety    Arthritis    Cancer ([place]) 03/2022   L breast   Depression    Family history of breast cancer 05/15/2022   Family history of pancreatic cancer 05/15/2022   Family history of prostate cancer 05/15/2022   GERD (gastroesophageal reflux disease)    History of hiatal hernia    patient believes she may have been told she has this years ago   Past Surgical History:  Procedure Laterality Date   BREAST BIOPSY  09/19/2022   MM LT RADIOACTIVE SEED EA ADD LESION LOC MAMMO GUIDE 09/19/2022 [facility]   BREAST BIOPSY  09/19/2022   MM LT RADIOACTIVE SEED LOC MAMMO GUIDE 09/19/2022 [facility]   BREAST LUMPECTOMY WITH RADIOACTIVE SEED AND AXILLARY LYMPH NODE DISSECTION Left 09/20/2022   Procedure: LEFT BREAST SEED LUMPECTOMY, LEFT AXILLARY LYMPH NODE DISSECTION;  Surgeon: Erroll Luna, MD;  Location: [place];  Service: General;  Laterality: Left;  GEN & PEC BLOCK   CERVIX SURGERY     Laser removal of pre-cancer cells   LASIK Bilateral    PORTACATH PLACEMENT Right 04/20/2022   Procedure: PORT PLACEMENT;  Surgeon: Erroll Luna, MD;  Location: [place];  Service: General;  Laterality: Right;   Patient Active Problem List   Diagnosis Date Noted   Other fatigue 08/28/2022   Acute pain 08/28/2022   Genetic testing 06/01/2022   Family history of breast cancer 05/15/2022   Family history of pancreatic cancer 05/15/2022   Family  history of prostate cancer 05/15/2022   Port-A-Cath in place 04/28/2022   Malignant neoplasm of upper-outer quadrant of left breast in female, estrogen receptor positive ([place]) 03/29/2022    PCP: Sidney Ace, MD  REFERRING PROVIDER: Nicholas Lose, MD  REFERRING DIAG: Left Breast Cancer  THERAPY DIAG:  Stiffness of L shoulder Stiffness of left shoulder, not elsewhere classified  Abnormal posture  Malignant neoplasm of upper-outer quadrant of left female breast, unspecified estrogen receptor status ([place])  Rationale for Evaluation and Treatment: Rehabilitation  ONSET DATE: 03/14/2022  SUBJECTIVE:  SUBJECTIVE STATEMENT:   Did fine after last visit . I think I am doing well. I can do all dressing, reaching to cabinets, most household chores. I am doing well with everything. The cording is there but it doesn't limit my range of motion. I am aware that it gets tight sometimes especially at the extremes of reaching.Marland Kitchen  PERTINENT HISTORY:  03/14/2022:Screening detected left breast masses 1.5 cm and 1.3 cm with enlarged left axillary lymph node, biopsy revealed grade 3 IDC with lymph node being positive, ER 95%, PR 0%, HER2 2+ by IHC and FISH positive with a ratio 2.45 and a copy #4.9, the second biopsy was HER2 negative with a ratio  1. Neoadjuvant chemotherapy with Herceptin Perjeta and letrozole for 8 cycles.  (Our original recommendation is TCHP x6 cycles but patient is not willing to go through chemo) 2. left lumpectomy and ALND 09/20/2022: 0.6 cm IDC 2/14 lymph nodes, margins negative, ER 95%, PR 0%, HER2 positive 3. Followed by adjuvant radiation therapy  4.  Adjuvant antiestrogen therapy with neratinib She had left lumpectomy on 09/20/2022 with 2/14 LN's  PATIENT GOALS:  Reassess how my recovery is going  related to arm function, pain, and swelling.  PAIN:  Are you having pain? 0/10  PRECAUTIONS: Recent Surgery, left UE Lymphedema risk,   ACTIVITY LEVEL / LEISURE: not yet,   OBJECTIVE:   PATIENT SURVEYS:   11/03/2022:   30 second sit to stand 12 reps with 1/10 RPE  pt felt she could do more  QUICK DASH: 34.09  OBSERVATIONS: Incisions healed with glue still present both incisions. Mild firmness under breast incision and foam pad made to place over it in bra. Several cords visible and palpable in axillary region and running to medial elbow.  POSTURE:  Forward head rounded shoulders  LYMPHEDEMA ASSESSMENT:   UPPER EXTREMITY AROM/PROM:   A/PROM RIGHT   eval    Shoulder extension 47  Shoulder flexion 163  Shoulder abduction 180  Shoulder internal rotation 67  Shoulder external rotation 107                          (Blank rows = not tested)   A/PROM LEFT   eval LEFT 10/19/2022 LEFT 10/24/22 LEFT 10/31/2022 LEFT  11/03/2022 LEFT 11/30/2022  Shoulder extension 60 40 68   60  Shoulder flexion 165 142 147 162 165 167  Shoulder abduction 180 74 114 143 163 180  Shoulder internal rotation 57     68  Shoulder external rotation 107     108                          (Blank rows = not tested)     CERVICAL AROM: All within normal limits:          UPPER EXTREMITY STRENGTH: WNL right, Left NT due to sx     LYMPHEDEMA ASSESSMENTS:    LANDMARK RIGHT   Eval 03/31/2022 RIGHT 10/19/2022  10 cm proximal to olecranon process 24.0 22.7  Olecranon process 22.3 21.6  10 cm proximal to ulnar styloid process 20.9 20.4  Just proximal to ulnar styloid process 14.6 14.35  Across hand at thumb web space 19.3 19.0  At base of 2nd digit 5.9 5.6  (Blank rows = not tested)   [facility] LEFT   Eval 03/31/2022 Left 10/19/2022  10 cm proximal to olecranon process 23.6 22.8  Olecranon process 21.6 21.6  10 cm proximal to ulnar styloid process 19.5 18.9  Just proximal to ulnar styloid  process 14.15 14.5  Across hand at thumb web space 18.45 18.0  At base of 2nd digit 5.7 5.4  (Blank rows = not tested)      Surgery type/Date: Left lumpectomy with SLNB Number of lymph nodes removed: 2/14 Current/past treatment (chemo, radiation, hormone therapy): Herceptin, radiation pending, anti estrogens Other symptoms:  Heaviness/tightness Yes Pain Yes Pitting edema No Infections No Decreased scar mobility No Stemmer sign No  TREATMENT PERFORMED TODAY: 11/30/2022 Discussed and assessed goals Measured ROM for PN Soft tissue mobilization left , UT, lats , lower ribs and SL scapular region  with cocoa butter, avoiding radiated areas MFR to left UE cording at axilla and medial arm and forearm, NTS Scar mobilization to breast incision PROM Left shoulder flexion, scaption, abduction   11/28/2022 Pulleys x 1:30 flexion and abduction Soft tissue mobilization left , UT, lats  and SL scapular region  bilaterally with cocoa butter, avoiding radiated areas MFR to left UE cording at axilla and medial arm and forearm, NTS PROM Left shoulder flexion, scaption, abduction Supine Bilateral shoulder AROM Flexion x 10, scaption x 5, horizontal abd x 5 Standing scapular retraciton, shoulder extension,bilateral ER x 10  11/23/2022 Overhead pulleys x 1:30 flexion, scaption, abduction Soft tissue mobilization left pectorals, UT, lats with cocoa butter MFR to left UE cording at axilla and medial arm and forearm, NTS PROM Left shoulder flexion, scaption, abduction Supine bilateral shoulder AROM flexion, scaption, abduction x 5 ea Standing scapular retraction, extension, bilateral ER yellow x 10  11/14/2022 Overhead pulleys for shoulder flexion and abducton x 2 min Ball roll up wall into flexion and Lt UE abduction x10 Modified downward dog on wall 5x, 5 sec   To supine for PROM to left shoulder into external rotation, flexion and aduction with good stretch  Manual work to left arm with cocoa  butter along cording path with longitudinal stretch and S stroke to soft tissue to bend cord. Radiaoplex gel gently to pt axilla and below breast per her request with gentle stationary circles and skin stretch on abdomen and lateral chest to support lymphatic flow.   11/09/22: Therapeutic Exercises Overhead pulleys for shoulder flexion and abducton x 2 min Ball roll up wall into flexion and Lt UE abduction x10 each returning therapist demo Modified downward dog on wall 5x, 5 sec holds returning therapist demo Supine over half roll for bil horz abd and then bil UE "V" into scaption x10 each  Reviewed supine scapular series with yellow theraband x5 each, mild pain reported with narrow grip flexion so encouraged pt to limit ROM to stopping before pain Manual Therapy PROM left shoulder flexion, abduction, and D2 with scapular depression throughout MFR: To Lt axilla and into medial upper arm to antecubital fossa along cord  11/07/2022  Overhead pulleys for shoulder flexion and abducton x 2 min Supine wand flexion and scaption x 4,  PROM left shoulder flexion, scaption, abduction MFR techniques with longitudinal stretch and S technique to decrease cording Left upper extremity Supine scapular series with yellow band x 5 ea Tried LTR but held as pt did not feel stretching with this today. Updated HEP and gave yellow band   11/03/2022 Sitting: scapular and upper thoracic ROM, Pt noting that she feels tight with thoracic rotation.  Pulley exercises for shoulder flexion, abduction and scaption with cues to feel stretch down into sidebody.  To supine for PROM to left  shoulder into external rotation, flexion and aduction with good stretch  and empty end feel. Pt with some stretch in axilla  with cord visible occasional pull into  upper arm. To sidelying for active abduction, flexion and small circles with hand pointed to ceiling and backward thoracic rotation.To supine for lower trunk rotation and  bridges.  Sit to stand in 30 sec. Test for 12 reps.       PATIENT EDUCATION:  Access Code: [email or web address] URL: [email or web address] Date: 11/28/2022 Prepared by: Cheral Almas  Exercises - Scapular Retraction with Resistance  - 1 x daily - 2-3 x weekly - 1 sets - 10 reps - Scapular Retraction with Resistance Advanced  - 1 x daily - 2-3 x weekly - 1 sets - 10 reps - Shoulder External Rotation and Scapular Retraction with Resistance  - 1 x daily - 2-3 x weekly - 10 reps Education details: supine flexion and stargazer, standing wall slide, sitting or standing scapular retraction. Person educated: Patient Education method: Explanation, Demonstration, and Handouts Education comprehension: verbalized understanding and returned demonstration  HOME EXERCISE PROGRAM: Reviewed previously given post op HEP. Practiced each x 5-7 reps and gave pt new handout secondary to misplacing hers.  ASSESSMENT:  CLINICAL IMPRESSION:  Pt is making excellent progress with ROM and function but is still frustrated by cording that runs from the incision into hr forearm. She is compliant with her HEP and she always feels better after therapy. She is picking up her sleeve and gauntlet today so it can be checked next week.We will see pt for follow up as needed over the next month to address sleeve, cording and ROM to enable her to reach all goals.   Pt will benefit from skilled therapeutic intervention to improve on the following deficits: Decreased knowledge of precautions, impaired UE functional use, pain, decreased ROM, postural dysfunction.   PT treatment/interventions: ADL/Self care home management, Therapeutic exercises, Therapeutic activity, Neuromuscular re-education, Patient/Family education, Self Care, Orthotic/Fit training, scar mobilization, Manual therapy, and Re-evaluation   GOALS: Goals reviewed with patient? Yes  LONG TERM GOALS:  (STG=LTG)  GOALS Name Target Date  Goal status  1 Pt  will demonstrate she has regained full shoulder ROM and function post operatively compared to baselines.  Baseline: 11/30/2022 MET  2 Pt will have quick dash no greater than 15% to demonstrate improved function 11/30/2022 MET  3 Pt will have improved pain by 50% or greater 11/30/2022 MET  4 Pt will be fit for a compression sleeve/gauntlet and will be independent in donning and doffing 12/28/2022 In Progress Fit but not yet picked up. Getting today  5 Pt will have decreased complaints of tightness from cording in axillary at end ROM 12/28/2022 NEW     PLAN:  PT FREQUENCY/DURATION: 1X/week x 4weeks  PLAN FOR NEXT SESSION: check sleeve, Review supine scapular series prn. Add standing TB to HEP,Continue pulleys, ball, STM left UQ, AAROM, PROM prn, MFR to cording, scar mobs ,  update HEP prn,   Cheral Almas, PT 11/30/22 9:51 AM   [facility]  [address] [place] [address]  [place] [address]  [phone]

## 2022-12-01 ENCOUNTER — Ambulatory Visit
Admission: RE | Admit: 2022-12-01 | Discharge: 2022-12-01 | Disposition: A | Discharge status: Home / Self Care | Payer: BC Managed Care – PPO | Source: Ambulatory Visit | Attending: Radiation Oncology | Admitting: Radiation Oncology

## 2022-12-01 ENCOUNTER — Ambulatory Visit: Payer: BC Managed Care – PPO

## 2022-12-01 ENCOUNTER — Other Ambulatory Visit: Payer: Self-pay

## 2022-12-01 DIAGNOSIS — C50412 Malignant neoplasm of upper-outer quadrant of left female breast: Secondary | ICD-10-CM | POA: Diagnosis not present

## 2022-12-01 LAB — RAD ONC ARIA SESSION SUMMARY
Course Elapsed Days: 24
Plan Fractions Treated to Date: 17
Plan Fractions Treated to Date: 17
Plan Prescribed Dose Per Fraction: 1.8 Gy
Plan Prescribed Dose Per Fraction: 1.8 Gy
Plan Total Fractions Prescribed: 28
Plan Total Fractions Prescribed: 28
Plan Total Prescribed Dose: 50.4 Gy
Plan Total Prescribed Dose: 50.4 Gy
Reference Point Dosage Given to Date: 30.6 Gy
Reference Point Dosage Given to Date: 30.6 Gy
Reference Point Session Dosage Given: 1.8 Gy
Reference Point Session Dosage Given: 1.8 Gy
Session Number: 17

## 2022-12-04 ENCOUNTER — Ambulatory Visit
Admission: RE | Admit: 2022-12-04 | Discharge: 2022-12-04 | Disposition: A | Discharge status: Home / Self Care | Payer: BC Managed Care – PPO | Source: Ambulatory Visit | Attending: Radiation Oncology | Admitting: Radiation Oncology

## 2022-12-04 ENCOUNTER — Other Ambulatory Visit: Payer: Self-pay

## 2022-12-04 ENCOUNTER — Ambulatory Visit (HOSPITAL_COMMUNITY)
Admission: RE | Admit: 2022-12-04 | Discharge: 2022-12-04 | Disposition: A | Discharge status: Home / Self Care | Payer: BC Managed Care – PPO | Source: Ambulatory Visit | Attending: Hematology and Oncology | Admitting: Hematology and Oncology

## 2022-12-04 DIAGNOSIS — Z5181 Encounter for therapeutic drug level monitoring: Secondary | ICD-10-CM | POA: Diagnosis not present

## 2022-12-04 DIAGNOSIS — Z170 Estrogen receptor positive status [ER+]: Secondary | ICD-10-CM

## 2022-12-04 DIAGNOSIS — C50412 Malignant neoplasm of upper-outer quadrant of left female breast: Secondary | ICD-10-CM | POA: Diagnosis not present

## 2022-12-04 DIAGNOSIS — Z0189 Encounter for other specified special examinations: Secondary | ICD-10-CM | POA: Diagnosis not present

## 2022-12-04 DIAGNOSIS — Z79899 Other long term (current) drug therapy: Secondary | ICD-10-CM

## 2022-12-04 LAB — RAD ONC ARIA SESSION SUMMARY
Course Elapsed Days: 27
Plan Fractions Treated to Date: 18
Plan Fractions Treated to Date: 18
Plan Prescribed Dose Per Fraction: 1.8 Gy
Plan Prescribed Dose Per Fraction: 1.8 Gy
Plan Total Fractions Prescribed: 28
Plan Total Fractions Prescribed: 28
Plan Total Prescribed Dose: 50.4 Gy
Plan Total Prescribed Dose: 50.4 Gy
Reference Point Dosage Given to Date: 32.4 Gy
Reference Point Dosage Given to Date: 32.4 Gy
Reference Point Session Dosage Given: 1.8 Gy
Reference Point Session Dosage Given: 1.8 Gy
Session Number: 18

## 2022-12-04 LAB — ECHOCARDIOGRAM COMPLETE
Area-P 1/2: 3.53 cm2
Calc EF: 55.5 %
S' Lateral: 2.6 cm
Single Plane A2C EF: 56.7 %
Single Plane A4C EF: 53.9 %

## 2022-12-04 NOTE — Progress Notes (Signed)
Echocardiogram 2D Echocardiogram has been performed.  Frances Furbish 12/04/2022, 10:54 AM

## 2022-12-05 ENCOUNTER — Ambulatory Visit
Admission: RE | Admit: 2022-12-05 | Discharge: 2022-12-05 | Disposition: A | Discharge status: Home / Self Care | Payer: BC Managed Care – PPO | Source: Ambulatory Visit | Attending: Radiation Oncology | Admitting: Radiation Oncology

## 2022-12-05 ENCOUNTER — Other Ambulatory Visit: Payer: Self-pay

## 2022-12-05 DIAGNOSIS — C50412 Malignant neoplasm of upper-outer quadrant of left female breast: Secondary | ICD-10-CM | POA: Diagnosis not present

## 2022-12-05 LAB — RAD ONC ARIA SESSION SUMMARY
Course Elapsed Days: 28
Plan Fractions Treated to Date: 19
Plan Fractions Treated to Date: 19
Plan Prescribed Dose Per Fraction: 1.8 Gy
Plan Prescribed Dose Per Fraction: 1.8 Gy
Plan Total Fractions Prescribed: 28
Plan Total Fractions Prescribed: 28
Plan Total Prescribed Dose: 50.4 Gy
Plan Total Prescribed Dose: 50.4 Gy
Reference Point Dosage Given to Date: 34.2 Gy
Reference Point Dosage Given to Date: 34.2 Gy
Reference Point Session Dosage Given: 1.8 Gy
Reference Point Session Dosage Given: 1.8 Gy
Session Number: 19

## 2022-12-06 ENCOUNTER — Other Ambulatory Visit: Payer: Self-pay

## 2022-12-06 ENCOUNTER — Encounter: Payer: Self-pay | Admitting: Radiation Oncology

## 2022-12-06 ENCOUNTER — Ambulatory Visit
Admission: RE | Admit: 2022-12-06 | Discharge: 2022-12-06 | Disposition: A | Discharge status: Home / Self Care | Payer: BC Managed Care – PPO | Source: Ambulatory Visit | Attending: Radiation Oncology | Admitting: Radiation Oncology

## 2022-12-06 DIAGNOSIS — C50412 Malignant neoplasm of upper-outer quadrant of left female breast: Secondary | ICD-10-CM | POA: Diagnosis not present

## 2022-12-06 LAB — RAD ONC ARIA SESSION SUMMARY
Course Elapsed Days: 29
Plan Fractions Treated to Date: 20
Plan Fractions Treated to Date: 20
Plan Prescribed Dose Per Fraction: 1.8 Gy
Plan Prescribed Dose Per Fraction: 1.8 Gy
Plan Total Fractions Prescribed: 28
Plan Total Fractions Prescribed: 28
Plan Total Prescribed Dose: 50.4 Gy
Plan Total Prescribed Dose: 50.4 Gy
Reference Point Dosage Given to Date: 36 Gy
Reference Point Dosage Given to Date: 36 Gy
Reference Point Session Dosage Given: 1.8 Gy
Reference Point Session Dosage Given: 1.8 Gy
Session Number: 20

## 2022-12-07 ENCOUNTER — Telehealth: Payer: Self-pay | Admitting: *Deleted

## 2022-12-07 ENCOUNTER — Ambulatory Visit: Payer: BC Managed Care – PPO

## 2022-12-07 ENCOUNTER — Encounter: Payer: Self-pay | Admitting: *Deleted

## 2022-12-07 DIAGNOSIS — C50412 Malignant neoplasm of upper-outer quadrant of left female breast: Secondary | ICD-10-CM | POA: Diagnosis not present

## 2022-12-07 NOTE — Telephone Encounter (Signed)
Left a voicemail for the patient in regards to the mychart message she sent.  She was asked to return call to [phone] to discuss her concerns.    Gloriajean Dell. Leonie Green, BSN

## 2022-12-08 ENCOUNTER — Ambulatory Visit: Payer: BC Managed Care – PPO

## 2022-12-08 ENCOUNTER — Encounter: Payer: Self-pay | Admitting: Radiation Oncology

## 2022-12-08 ENCOUNTER — Ambulatory Visit: Payer: BC Managed Care – PPO | Admitting: Radiation Oncology

## 2022-12-11 ENCOUNTER — Ambulatory Visit: Payer: BC Managed Care – PPO

## 2022-12-11 ENCOUNTER — Encounter: Payer: Self-pay | Admitting: Adult Health

## 2022-12-11 ENCOUNTER — Encounter: Payer: Self-pay | Admitting: *Deleted

## 2022-12-11 ENCOUNTER — Encounter: Payer: Self-pay | Admitting: Hematology and Oncology

## 2022-12-11 NOTE — Progress Notes (Signed)
  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 This was to be delivered using a 3-D conformal technique. The patient did not receive the planned boost over 10 Gy in 5 fractions.  In total, she completed 36 Gy in 20 of the 33 planned fractions.  Narrative: The patient tolerated radiation treatment relatively well. She developed fatigue and anticipated skin changes in the treatment field.    Plan: The patient decided to forgo the remainder of her radiation and was not interested in engaging in discussion about what her concerns with to make this decision. The patient will receive a call in about one month from the radiation oncology department. She will continue follow up with Dr. Lindi Adie as well.      Carola Rhine, PAC

## 2022-12-12 ENCOUNTER — Ambulatory Visit: Payer: BC Managed Care – PPO

## 2022-12-12 DIAGNOSIS — C50412 Malignant neoplasm of upper-outer quadrant of left female breast: Secondary | ICD-10-CM

## 2022-12-12 DIAGNOSIS — R293 Abnormal posture: Secondary | ICD-10-CM

## 2022-12-12 DIAGNOSIS — M25612 Stiffness of left shoulder, not elsewhere classified: Secondary | ICD-10-CM

## 2022-12-12 NOTE — Therapy (Signed)
OUTPATIENT PHYSICAL THERAPY BREAST CANCER TREATMENT   Patient Name: Caitlyn Torres MRN: [id number] DOB:Sep 25, 1961, 62 y.o., female Today's Date: 12/12/2022  END OF SESSION:  PT End of Session - 12/12/22 1203     Visit Number 12    Number of Visits 15    Date for PT Re-Evaluation 12/28/22    PT Start Time 1204    PT Stop Time 6789    PT Time Calculation (min) 51 min    Activity Tolerance Patient tolerated treatment well    Behavior During Therapy [facility] for tasks assessed/performed               Past Medical History:  Diagnosis Date   Anxiety    Arthritis    Cancer ([place]) 03/2022   L breast   Depression    Family history of breast cancer 05/15/2022   Family history of pancreatic cancer 05/15/2022   Family history of prostate cancer 05/15/2022   GERD (gastroesophageal reflux disease)    History of hiatal hernia    patient believes she may have been told she has this years ago   Past Surgical History:  Procedure Laterality Date   BREAST BIOPSY  09/19/2022   MM LT RADIOACTIVE SEED EA ADD LESION LOC MAMMO GUIDE 09/19/2022 [facility] MAMMO[facility]AP[facility]   BREAST BIOPSY  09/19/2022   MM LT RADIOACTIVE SEED LOC MAMMO GUIDE 09/19/2022 [facility] MAMMOGRAPHY   BREAST LUMPECTOMY WITH RADIOACTIVE SEED AND AXILLARY LYMPH NODE DISSECTION Left 09/20/2022   Procedure: LEFT BREAST SEED LUMPECTOMY, LEFT AXILLARY LYMPH NODE DISSECTION;  Surgeon: Erroll Luna, MD;  Location: [place];  Service: General;  Laterality: Left;  GEN & PEC BLOCK   CERVIX SURGERY     Laser removal of pre-cancer cells   LASIK Bilateral    PORTACATH PLACEMENT Right 04/20/2022   Procedure: PORT PLACEMENT;  Surgeon: Erroll Luna, MD;  Location: [place];  Service: General;  Laterality: Right;   Patient Active Problem List   Diagnosis Date Noted   Other fatigue 08/28/2022   Acute pain 08/28/2022   Genetic testing 06/01/2022   Family history of breast cancer 05/15/2022   Family history of pancreatic cancer 05/15/2022   Family  history of prostate cancer 05/15/2022   Port-A-Cath in place 04/28/2022   Malignant neoplasm of upper-outer quadrant of left breast in female, estrogen receptor positive ([place]) 03/29/2022    PCP: Sidney Ace, MD  REFERRING PROVIDER: Nicholas Lose, MD  REFERRING DIAG: Left Breast Cancer  THERAPY DIAG:  Stiffness of L shoulder Stiffness of left shoulder, not elsewhere classified  Abnormal posture  Malignant neoplasm of upper-outer quadrant of left female breast, unspecified estrogen receptor status ([place])  Rationale for Evaluation and Treatment: Rehabilitation  ONSET DATE: 03/14/2022  SUBJECTIVE:  SUBJECTIVE STATEMENT:    I stopped the radiation. I just couldn't do it anymore. My body and mind was telling me I was done. My skin got really itchy. I got my sleeve and gauntlet.  I tried it on at the store and it feels fine.I started feeling a litlle tight with the radiation and my skin is very itchy.  PERTINENT HISTORY:  03/14/2022:Screening detected left breast masses 1.5 cm and 1.3 cm with enlarged left axillary lymph node, biopsy revealed grade 3 IDC with lymph node being positive, ER 95%, PR 0%, HER2 2+ by IHC and FISH positive with a ratio 2.45 and a copy #4.9, the second biopsy was HER2 negative with a ratio  1. Neoadjuvant chemotherapy with Herceptin Perjeta and letrozole for 8 cycles.  (Our original recommendation is TCHP x6 cycles but patient is not willing to go through chemo) 2. left lumpectomy and ALND 09/20/2022: 0.6 cm IDC 2/14 lymph nodes, margins negative, ER 95%, PR 0%, HER2 positive 3. Followed by adjuvant radiation therapy  4.  Adjuvant antiestrogen therapy with neratinib She had left lumpectomy on 09/20/2022 with 2/14 LN's  PATIENT GOALS:  Reassess how my recovery is going related to arm  function, pain, and swelling.  PAIN:  Are you having pain? 0/10  PRECAUTIONS: Recent Surgery, left UE Lymphedema risk,   ACTIVITY LEVEL / LEISURE: not yet,   OBJECTIVE:   PATIENT SURVEYS:   11/03/2022:   30 second sit to stand 12 reps with 1/10 RPE  pt felt she could do more  QUICK DASH: 34.09  OBSERVATIONS: Incisions healed with glue still present both incisions. Mild firmness under breast incision and foam pad made to place over it in bra. Several cords visible and palpable in axillary region and running to medial elbow.  POSTURE:  Forward head rounded shoulders  LYMPHEDEMA ASSESSMENT:   UPPER EXTREMITY AROM/PROM:   A/PROM RIGHT   eval    Shoulder extension 47  Shoulder flexion 163  Shoulder abduction 180  Shoulder internal rotation 67  Shoulder external rotation 107                          (Blank rows = not tested)   A/PROM LEFT   eval LEFT 10/19/2022 LEFT 10/24/22 LEFT 10/31/2022 LEFT  11/03/2022 LEFT 11/30/2022  Shoulder extension 60 40 68   60  Shoulder flexion 165 142 147 162 165 167  Shoulder abduction 180 74 114 143 163 180  Shoulder internal rotation 57     68  Shoulder external rotation 107     108                          (Blank rows = not tested)     CERVICAL AROM: All within normal limits:          UPPER EXTREMITY STRENGTH: WNL right, Left NT due to sx     LYMPHEDEMA ASSESSMENTS:    LANDMARK RIGHT   Eval 03/31/2022 RIGHT 10/19/2022  10 cm proximal to olecranon process 24.0 22.7  Olecranon process 22.3 21.6  10 cm proximal to ulnar styloid process 20.9 20.4  Just proximal to ulnar styloid process 14.6 14.35  Across hand at thumb web space 19.3 19.0  At base of 2nd digit 5.9 5.6  (Blank rows = not tested)   [facility] LEFT   Eval 03/31/2022 Left 10/19/2022  10 cm proximal to olecranon process 23.6 22.8  Olecranon process  21.6 21.6  10 cm proximal to ulnar styloid process 19.5 18.9  Just proximal to ulnar styloid process 14.15 14.5   Across hand at thumb web space 18.45 18.0  At base of 2nd digit 5.7 5.4  (Blank rows = not tested)      Surgery type/Date: Left lumpectomy with SLNB Number of lymph nodes removed: 2/14 Current/past treatment (chemo, radiation, hormone therapy): Herceptin, radiation pending, anti estrogens Other symptoms:  Heaviness/tightness Yes Pain Yes Pitting edema No Infections No Decreased scar mobility No Stemmer sign No  TREATMENT PERFORMED TODAY:  12/12/2022 Pts. Skin very red at the chest and axillary region as well as the upper back. Nipple appears very irritated. Did not think pt would tolerate soft tissue mobilization today. No noted swelling. Overhead pulleys x 2 min flexion and abduction Pt donned and doffed her compression sleeve and gauntlet independently and was shown how to use rubber gloves to assist. Discussed times she would wear garments MFR to left UE cording at axilla and medial arm and forearm, NTS PROM to left shoulder flexion, scaption, abd with pinning at axilla off of radiated skin Lumbar rotation x 3 bilaterally 11/30/2022 Discussed and assessed goals Measured ROM for PN Soft tissue mobilization left , UT, lats , lower ribs and SL scapular region  with cocoa butter, avoiding radiated areas MFR to left UE cording at axilla and medial arm and forearm, NTS Scar mobilization to breast incision PROM Left shoulder flexion, scaption, abduction   11/28/2022 Pulleys x 1:30 flexion and abduction Soft tissue mobilization left , UT, lats  and SL scapular region  bilaterally with cocoa butter, avoiding radiated areas MFR to left UE cording at axilla and medial arm and forearm, NTS PROM Left shoulder flexion, scaption, abduction Supine Bilateral shoulder AROM Flexion x 10, scaption x 5, horizontal abd x 5 Standing scapular retraciton, shoulder extension,bilateral ER x 10  11/23/2022 Overhead pulleys x 1:30 flexion, scaption, abduction Soft tissue mobilization left pectorals,  UT, lats with cocoa butter MFR to left UE cording at axilla and medial arm and forearm, NTS PROM Left shoulder flexion, scaption, abduction Supine bilateral shoulder AROM flexion, scaption, abduction x 5 ea Standing scapular retraction, extension, bilateral ER yellow x 10  11/14/2022 Overhead pulleys for shoulder flexion and abducton x 2 min Ball roll up wall into flexion and Lt UE abduction x10 Modified downward dog on wall 5x, 5 sec   To supine for PROM to left shoulder into external rotation, flexion and aduction with good stretch  Manual work to left arm with cocoa butter along cording path with longitudinal stretch and S stroke to soft tissue to bend cord. Radiaoplex gel gently to pt axilla and below breast per her request with gentle stationary circles and skin stretch on abdomen and lateral chest to support lymphatic flow.   11/09/22: Therapeutic Exercises Overhead pulleys for shoulder flexion and abducton x 2 min Ball roll up wall into flexion and Lt UE abduction x10 each returning therapist demo Modified downward dog on wall 5x, 5 sec holds returning therapist demo Supine over half roll for bil horz abd and then bil UE "V" into scaption x10 each  Reviewed supine scapular series with yellow theraband x5 each, mild pain reported with narrow grip flexion so encouraged pt to limit ROM to stopping before pain Manual Therapy PROM left shoulder flexion, abduction, and D2 with scapular depression throughout MFR: To Lt axilla and into medial upper arm to antecubital fossa along cord  11/07/2022  Overhead pulleys for  shoulder flexion and abducton x 2 min Supine wand flexion and scaption x 4,  PROM left shoulder flexion, scaption, abduction MFR techniques with longitudinal stretch and S technique to decrease cording Left upper extremity Supine scapular series with yellow band x 5 ea Tried LTR but held as pt did not feel stretching with this today. Updated HEP and gave yellow  band   11/03/2022 Sitting: scapular and upper thoracic ROM, Pt noting that she feels tight with thoracic rotation.  Pulley exercises for shoulder flexion, abduction and scaption with cues to feel stretch down into sidebody.  To supine for PROM to left shoulder into external rotation, flexion and aduction with good stretch  and empty end feel. Pt with some stretch in axilla  with cord visible occasional pull into  upper arm. To sidelying for active abduction, flexion and small circles with hand pointed to ceiling and backward thoracic rotation.To supine for lower trunk rotation and bridges.  Sit to stand in 30 sec. Test for 12 reps.       PATIENT EDUCATION:  Access Code: [email or web address] URL: [email or web address] Date: 11/28/2022 Prepared by: Cheral Almas  Exercises - Scapular Retraction with Resistance  - 1 x daily - 2-3 x weekly - 1 sets - 10 reps - Scapular Retraction with Resistance Advanced  - 1 x daily - 2-3 x weekly - 1 sets - 10 reps - Shoulder External Rotation and Scapular Retraction with Resistance  - 1 x daily - 2-3 x weekly - 10 reps Education details: supine flexion and stargazer, standing wall slide, sitting or standing scapular retraction. Person educated: Patient Education method: Explanation, Demonstration, and Handouts Education comprehension: verbalized understanding and returned demonstration  HOME EXERCISE PROGRAM: Reviewed previously given post op HEP. Practiced each x 5-7 reps and gave pt new handout secondary to misplacing hers.  ASSESSMENT:  CLINICAL IMPRESSION:  Pts skin still very red from radiation and held soft tissue mobilization today. She has tightened up from radiation so performed MFR to cording and PROM. Pt achieved goal for compression sleeve and gauntlet  Pt will benefit from skilled therapeutic intervention to improve on the following deficits: Decreased knowledge of precautions, impaired UE functional use, pain, decreased ROM, postural  dysfunction.   PT treatment/interventions: ADL/Self care home management, Therapeutic exercises, Therapeutic activity, Neuromuscular re-education, Patient/Family education, Self Care, Orthotic/Fit training, scar mobilization, Manual therapy, and Re-evaluation   GOALS: Goals reviewed with patient? Yes  LONG TERM GOALS:  (STG=LTG)  GOALS Name Target Date  Goal status  1 Pt will demonstrate she has regained full shoulder ROM and function post operatively compared to baselines.  Baseline: 11/30/2022 MET  2 Pt will have quick dash no greater than 15% to demonstrate improved function 11/30/2022 MET  3 Pt will have improved pain by 50% or greater 11/30/2022 MET  4 Pt will be fit for a compression sleeve/gauntlet and will be independent in donning and doffing 12/28/2022 MET 12/12/2022  5 Pt will have decreased complaints of tightness from cording in axillary at end ROM 12/28/2022 NEW     PLAN:  PT FREQUENCY/DURATION: 1X/week x 4weeks  PLAN FOR NEXT SESSION: check skin, Review supine scapular series prn. ,Continue pulleys, ball, STM left UQ, AAROM, PROM prn, MFR to cording, scar mobs ,  update HEP prn,   Cheral Almas, PT 12/12/22 12:59 PM   [facility]  [address], [address]  [place] [address]  [phone]

## 2022-12-13 ENCOUNTER — Ambulatory Visit: Payer: BC Managed Care – PPO

## 2022-12-13 ENCOUNTER — Ambulatory Visit: Payer: BC Managed Care – PPO | Admitting: Radiation Oncology

## 2022-12-14 ENCOUNTER — Ambulatory Visit: Payer: BC Managed Care – PPO

## 2022-12-15 ENCOUNTER — Ambulatory Visit: Payer: BC Managed Care – PPO | Admitting: Radiation Oncology

## 2022-12-15 ENCOUNTER — Inpatient Hospital Stay: Payer: BC Managed Care – PPO | Attending: Hematology and Oncology

## 2022-12-15 ENCOUNTER — Ambulatory Visit: Payer: BC Managed Care – PPO

## 2022-12-15 ENCOUNTER — Inpatient Hospital Stay (HOSPITAL_BASED_OUTPATIENT_CLINIC_OR_DEPARTMENT_OTHER): Payer: BC Managed Care – PPO | Admitting: Hematology and Oncology

## 2022-12-15 ENCOUNTER — Inpatient Hospital Stay: Payer: BC Managed Care – PPO

## 2022-12-15 VITALS — BP 132/61 | HR 73 | Temp 97.7°F | Resp 16 | Wt 117.0 lb

## 2022-12-15 VITALS — BP 132/70 | HR 78 | Resp 16

## 2022-12-15 DIAGNOSIS — Z79811 Long term (current) use of aromatase inhibitors: Secondary | ICD-10-CM | POA: Insufficient documentation

## 2022-12-15 DIAGNOSIS — Z923 Personal history of irradiation: Secondary | ICD-10-CM | POA: Insufficient documentation

## 2022-12-15 DIAGNOSIS — C50412 Malignant neoplasm of upper-outer quadrant of left female breast: Secondary | ICD-10-CM

## 2022-12-15 DIAGNOSIS — D72819 Decreased white blood cell count, unspecified: Secondary | ICD-10-CM | POA: Diagnosis not present

## 2022-12-15 DIAGNOSIS — Z79899 Other long term (current) drug therapy: Secondary | ICD-10-CM | POA: Diagnosis not present

## 2022-12-15 DIAGNOSIS — Z5112 Encounter for antineoplastic immunotherapy: Secondary | ICD-10-CM | POA: Insufficient documentation

## 2022-12-15 DIAGNOSIS — Z9221 Personal history of antineoplastic chemotherapy: Secondary | ICD-10-CM | POA: Insufficient documentation

## 2022-12-15 DIAGNOSIS — Z170 Estrogen receptor positive status [ER+]: Secondary | ICD-10-CM | POA: Diagnosis not present

## 2022-12-15 DIAGNOSIS — R5383 Other fatigue: Secondary | ICD-10-CM | POA: Diagnosis not present

## 2022-12-15 DIAGNOSIS — C773 Secondary and unspecified malignant neoplasm of axilla and upper limb lymph nodes: Secondary | ICD-10-CM | POA: Diagnosis not present

## 2022-12-15 DIAGNOSIS — Z95828 Presence of other vascular implants and grafts: Secondary | ICD-10-CM

## 2022-12-15 LAB — CMP (CANCER CENTER ONLY)
ALT: 42 U/L (ref 0–44)
AST: 38 U/L (ref 15–41)
Albumin: 4 g/dL (ref 3.5–5.0)
Alkaline Phosphatase: 67 U/L (ref 38–126)
Anion gap: 5 (ref 5–15)
BUN: 12 mg/dL (ref 6–20)
CO2: 29 mmol/L (ref 22–32)
Calcium: 9.6 mg/dL (ref 8.9–10.3)
Chloride: 104 mmol/L (ref 98–111)
Creatinine: 0.53 mg/dL (ref 0.44–1.00)
GFR, Estimated: >60 mL/min (ref >60)
Glucose, Bld: 92 mg/dL (ref 70–99)
Potassium: 3.9 mmol/L (ref 3.5–5.1)
Sodium: 138 mmol/L (ref 135–145)
Total Bilirubin: 0.4 mg/dL (ref 0.3–1.2)
Total Protein: 6.6 g/dL (ref 6.5–8.1)

## 2022-12-15 LAB — CBC WITH DIFFERENTIAL (CANCER CENTER ONLY)
Abs Immature Granulocytes: 0.01 10*3/uL (ref 0.00–0.07)
Basophils Absolute: 0 10*3/uL (ref 0.0–0.1)
Basophils Relative: 1 %
Eosinophils Absolute: 0.2 10*3/uL (ref 0.0–0.5)
Eosinophils Relative: 4 %
HCT: 38 % (ref 36.0–46.0)
Hemoglobin: 13.3 g/dL (ref 12.0–15.0)
Immature Granulocytes: 0 %
Lymphocytes Relative: 20 %
Lymphs Abs: 0.8 10*3/uL (ref 0.7–4.0)
MCH: 29.3 pg (ref 26.0–34.0)
MCHC: 35 g/dL (ref 30.0–36.0)
MCV: 83.7 fL (ref 80.0–100.0)
Monocytes Absolute: 0.4 10*3/uL (ref 0.1–1.0)
Monocytes Relative: 11 %
Neutro Abs: 2.5 10*3/uL (ref 1.7–7.7)
Neutrophils Relative %: 64 %
Platelet Count: 179 10*3/uL (ref 150–400)
RBC: 4.54 MIL/uL (ref 3.87–5.11)
RDW: 14.6 % (ref 11.5–15.5)
WBC Count: 3.8 10*3/uL — ABNORMAL LOW (ref 4.0–10.5)
nRBC: 0 % (ref 0.0–0.2)

## 2022-12-15 MED ORDER — DIPHENHYDRAMINE HCL 25 MG PO CAPS
25.0000 mg | ORAL_CAPSULE | Freq: Once | ORAL | Status: AC
  Administered 2022-12-15: 25 mg via ORAL
  Filled 2022-12-15: qty 1

## 2022-12-15 MED ORDER — PROCHLORPERAZINE MALEATE 10 MG PO TABS
10.0000 mg | ORAL_TABLET | Freq: Once | ORAL | Status: AC
  Administered 2022-12-15: 10 mg via ORAL
  Filled 2022-12-15: qty 1

## 2022-12-15 MED ORDER — SODIUM CHLORIDE 0.9 % IV SOLN
3.6000 mg/kg | Freq: Once | INTRAVENOUS | Status: AC
  Administered 2022-12-15: 200 mg via INTRAVENOUS
  Filled 2022-12-15: qty 10

## 2022-12-15 MED ORDER — SODIUM CHLORIDE 0.9% FLUSH
10.0000 mL | Freq: Once | INTRAVENOUS | Status: AC
  Administered 2022-12-15: 10 mL

## 2022-12-15 MED ORDER — SODIUM CHLORIDE 0.9 % IV SOLN: Freq: Once | INTRAVENOUS | Status: AC

## 2022-12-15 NOTE — Assessment & Plan Note (Addendum)
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 Neoadjuvant chemotherapy with Herceptin Perjeta and letrozole for 8 cycles.  (Our original recommendation is TCHP x6 cycles but patient is not willing to go through chemo) 2. left lumpectomy and ALND 09/20/2022: 0.6 cm IDC 2/14 lymph nodes, margins negative, ER 95%, PR 0%, HER2 positive 3. Followed by adjuvant radiation therapy started 11/02/2022 (stopped after 20 fractions by choice) 4.  Adjuvant antiestrogen therapy with neratinib ------------------------------------------------------------------------------------------ Current treatment: Kadcyla maintenance cycle 4 Kadcyla toxicities: Fatigue Leukopenia: Monitoring closely   Recent flu illness: Resolved   Return to clinic in 3 weeks for cycle 5

## 2022-12-15 NOTE — Progress Notes (Signed)
Patient Care Team: Bradd Canary, MD as PCP - General (Family Medicine) Mauro Kaufmann, RN as Oncology Nurse Navigator Rockwell Germany, RN as Oncology Nurse Navigator Nicholas Lose, MD as Consulting Physician (Hematology and Oncology)  DIAGNOSIS:  Encounter Diagnosis  Name Primary?   Malignant neoplasm of upper-outer quadrant of left breast in female, estrogen receptor positive ([place]) Yes    SUMMARY OF ONCOLOGIC HISTORY: Oncology History  Malignant neoplasm of upper-outer quadrant of left breast in female, estrogen receptor positive ([place])  03/14/2022 Initial Diagnosis   Screening detected left breast masses 1.5 cm and 1.3 cm with enlarged left axillary lymph node, biopsy revealed grade 3 IDC with lymph node being positive, ER 95%, PR 0%, HER2 2+ by IHC and FISH positive with a ratio 2.45 and a copy #4.9, the second biopsy was HER2 negative with a ratio 2.15 and a copy #3.55   03/29/2022 Cancer Staging   Staging form: Breast, AJCC 8th Edition - Clinical: Stage IIA (cT1c, cN1, cM0, G3, ER+, PR-, HER2+) - Signed by Nicholas Lose, MD on 03/29/2022 Stage prefix: Initial diagnosis Histologic grading system: 3 grade system   03/29/2022 -  Anti-estrogen oral therapy   Letrozole daily   04/21/2022 -  Chemotherapy   Herceptin/Perjeta every 21 days initially x 3, will reassess with MRI and then will decide whether to continue or to add chemo.  (Original recommendation was TCHP x 6)      Genetic Testing   [facility] CancerNext-Expanded Panel was Negative. Report date is 05/30/2022.  The CancerNext-Expanded gene panel offered by [facility] and includes sequencing, rearrangement, and RNA analysis for the following 77 genes: AIP, ALK, APC, ATM, AXIN2, BAP1, BARD1, BLM, BMPR1A, BRCA1, BRCA2, BRIP1, CDC73, CDH1, CDK4, CDKN1B, CDKN2A, CHEK2, CTNNA1, DICER1, FANCC, FH, FLCN, GALNT12, KIF1B, LZTR1, MAX, MEN1, MET, MLH1, MSH2, MSH3, MSH6, MUTYH, NBN, NF1, NF2, NTHL1, PALB2, PHOX2B, PMS2, POT1, PRKAR1A,  PTCH1, PTEN, RAD51C, RAD51D, RB1, RECQL, RET, SDHA, SDHAF2, SDHB, SDHC, SDHD, SMAD4, SMARCA4, SMARCB1, SMARCE1, STK11, SUFU, TMEM127, TP53, TSC1, TSC2, VHL and XRCC2 (sequencing and deletion/duplication); EGFR, EGLN1, HOXB13, KIT, MITF, PDGFRA, POLD1, and POLE (sequencing only); EPCAM and GREM1 (deletion/duplication only).    04/21/2022 - 09/14/2022 Chemotherapy   Patient is on Treatment Plan : BREAST Trastuzumab  + Pertuzumab q21d x 13 cycles     10/13/2022 -  Chemotherapy   Patient is on Treatment Plan : BREAST ADO-Trastuzumab Emtansine (Kadcyla) q21d       CHIEF COMPLIANT: Kadcyla cycle 4  INTERVAL HISTORY: Caitlyn Torres is a 62 year old above-mentioned history of HER2 positive breast cancer was currently on Kadcyla. She presents to the clinic for a follow-up and cycle 4 Kadcyla. She say she not she not as tired as she was. Taste and appetite has got better.      ALLERGIES:  is allergic to aleve [naproxen sodium], aspirin, macrobid [nitrofurantoin], motrin [ibuprofen], and tylenol [acetaminophen].  MEDICATIONS:  Current Outpatient Medications  Medication Sig Dispense Refill   benzonatate (TESSALON) 100 MG capsule Take 1 capsule (100 mg total) by mouth 3 (three) times daily as needed. 30 capsule 0   Brimonidine Tartrate (LUMIFY) 0.025 % SOLN Place 1 drop into both eyes daily as needed (red eyes).     carboxymethylcellulose (REFRESH PLUS) 0.5 % SOLN Place 1 drop into both eyes 3 (three) times daily as needed (dry eyes).     cetirizine (ZYRTEC) 10 MG tablet Take 10 mg by mouth daily.     fluticasone (FLONASE) 50 MCG/ACT nasal spray  Place 2 sprays into both nostrils daily. 16 g 0   lidocaine (XYLOCAINE) 2 % solution Swish and swallow 70m every 4 hours as needed for sore throat 100 mL 0   Lidocaine-Prilocaine &Lido HCl 2.5-2.5 & 3.88 % KIT APPLY TO AFFECTED AREA ONCE AS DIRECTED     magic mouthwash w/lidocaine SOLN Take 5 mLs by mouth 4 (four) times daily as needed for mouth pain. 240 mL 1    Multiple Vitamins-Iron (CHLORELLA) CAPS Take 1 capsule by mouth daily.     ondansetron (ZOFRAN) 8 MG tablet Take 1 tablet (8 mg total) by mouth every 8 (eight) hours as needed for nausea or vomiting. 30 tablet 1   OVER THE COUNTER MEDICATION Take 1 capsule by mouth daily. Amla     OVER THE COUNTER MEDICATION Take 1 capsule by mouth daily. Mushroom Complex     OVER THE COUNTER MEDICATION Place 1 Application vaginally daily as needed (moisture). Femininity     QUERCETIN PO Take 1 capsule by mouth daily.     sodium chloride (OCEAN) 0.65 % SOLN nasal spray Place 1 spray into both nostrils as needed for congestion.     trimethoprim-polymyxin b (POLYTRIM) ophthalmic solution Place 1 drop into the right eye every 4 (four) hours. X 5 days 10 mL 0   TURMERIC CURCUMIN PO Take 1 capsule by mouth daily.     No current facility-administered medications for this visit.    PHYSICAL EXAMINATION: ECOG PERFORMANCE STATUS: 1 - Symptomatic but completely ambulatory  Vitals:   12/15/22 1221  BP: 132/61  Pulse: 73  Resp: 16  Temp: 97.7 F (36.5 C)  SpO2: 100%   Filed Weights   12/15/22 1221  Weight: 117 lb (53.1 kg)      LABORATORY DATA:  I have reviewed the data as listed    Latest Ref Rng & Units 11/24/2022    7:47 AM 11/02/2022    8:07 AM 10/13/2022    8:57 AM  CMP  Glucose 70 - 99 mg/dL 86  100  110   BUN 6 - 20 mg/dL '6  7  14   '$ Creatinine 0.44 - 1.00 mg/dL 0.45  0.65  0.62   Sodium 135 - 145 mmol/L 133  139  139   Potassium 3.5 - 5.1 mmol/L 3.8  3.9  4.0   Chloride 98 - 111 mmol/L 100  105  105   CO2 22 - 32 mmol/L '28  29  27   '$ Calcium 8.9 - 10.3 mg/dL 9.2  9.8  10.2   Total Protein 6.5 - 8.1 g/dL 6.7  6.8  7.2   Total Bilirubin 0.3 - 1.2 mg/dL 0.3  0.4  0.4   Alkaline Phos 38 - 126 U/L 58  63  61   AST 15 - 41 U/L 18  51  28   ALT 0 - 44 U/L 20  75  33     Lab Results  Component Value Date   WBC 3.8 (L) 12/15/2022   HGB 13.3 12/15/2022   HCT 38.0 12/15/2022   MCV 83.7  12/15/2022   PLT 179 12/15/2022   NEUTROABS 2.5 12/15/2022    ASSESSMENT & PLAN:  Malignant neoplasm of upper-outer quadrant of left breast in female, [place] positive [place] 03/14/2022:Screening detected left breast masses 1.5 cm and 1.3 cm with enlarged left axillary lymph node, biopsy revealed grade 3 IDC with lymph node being positive, ER 95%, PR 0%, HER2 2+ by IHC and FISH positive with a  ratio 2.45 and a copy #4.9, the second biopsy was HER2 negative with a ratio 2.15 and a copy #3.55   Treatment plan based on multidisciplinary tumor board: 1. Neoadjuvant chemotherapy with Herceptin Perjeta and letrozole for 8 cycles.  (Our original recommendation is TCHP x6 cycles but patient is not willing to go through chemo) 2. left lumpectomy and ALND 09/20/2022: 0.6 cm IDC 2/14 lymph nodes, margins negative, ER 95%, PR 0%, HER2 positive 3. Followed by adjuvant radiation therapy started 11/02/2022 (stopped after 20 fractions by choice) 4.  Adjuvant antiestrogen therapy with neratinib ------------------------------------------------------------------------------------------ Current treatment: Kadcyla maintenance cycle 4 Kadcyla toxicities: Fatigue Leukopenia: Monitoring closely   Recent flu illness: Resolved   Return to clinic in 3 weeks for cycle 5    No orders of the defined types were placed in this encounter.  The patient has a good understanding of the overall plan. she agrees with it. she will call with any problems that may develop before the next visit here. Total time spent: 30 mins including face to face time and time spent for planning, charting and co-ordination of care   Harriette Ohara, MD 12/15/22    I Gardiner Coins am acting as a Education administrator for [employer]  I have reviewed the above documentation for accuracy and completeness, and I agree with the above.

## 2022-12-18 ENCOUNTER — Ambulatory Visit: Payer: BC Managed Care – PPO

## 2022-12-19 ENCOUNTER — Ambulatory Visit: Payer: BC Managed Care – PPO | Attending: Surgery

## 2022-12-19 ENCOUNTER — Ambulatory Visit: Payer: BC Managed Care – PPO

## 2022-12-19 DIAGNOSIS — M25612 Stiffness of left shoulder, not elsewhere classified: Secondary | ICD-10-CM | POA: Diagnosis present

## 2022-12-19 DIAGNOSIS — R293 Abnormal posture: Secondary | ICD-10-CM | POA: Diagnosis present

## 2022-12-19 DIAGNOSIS — C50412 Malignant neoplasm of upper-outer quadrant of left female breast: Secondary | ICD-10-CM | POA: Insufficient documentation

## 2022-12-19 NOTE — Therapy (Signed)
OUTPATIENT PHYSICAL THERAPY BREAST CANCER TREATMENT   Patient Name: Caitlyn Torres MRN: [id number] DOB:1961/08/26, 62 y.o., female Today's Date: 12/19/2022  END OF SESSION:  PT End of Session - 12/19/22 1103     Visit Number 13    Number of Visits 15    Date for PT Re-Evaluation 12/28/22    PT Start Time 1105    PT Stop Time 2683    PT Time Calculation (min) 53 min    Activity Tolerance Patient tolerated treatment well    Behavior During Therapy [facility] for tasks assessed/performed               Past Medical History:  Diagnosis Date   Anxiety    Arthritis    Cancer ([place]) 03/2022   L breast   Depression    Family history of breast cancer 05/15/2022   Family history of pancreatic cancer 05/15/2022   Family history of prostate cancer 05/15/2022   GERD (gastroesophageal reflux disease)    History of hiatal hernia    patient believes she may have been told she has this years ago   Past Surgical History:  Procedure Laterality Date   BREAST BIOPSY  09/19/2022   MM LT RADIOACTIVE SEED EA ADD LESION LOC MAMMO GUIDE 09/19/2022 [facility]   BREAST BIOPSY  09/19/2022   MM LT RADIOACTIVE SEED LOC MAMMO GUIDE 09/19/2022 [facility]   BREAST LUMPECTOMY WITH RADIOACTIVE SEED AND AXILLARY LYMPH NODE DISSECTION Left 09/20/2022   Procedure: LEFT BREAST SEED LUMPECTOMY, LEFT AXILLARY LYMPH NODE DISSECTION;  Surgeon: Erroll Luna, MD;  Location: [place];  Service: General;  Laterality: Left;  GEN & PEC BLOCK   CERVIX SURGERY     Laser removal of pre-cancer cells   LASIK Bilateral    PORTACATH PLACEMENT Right 04/20/2022   Procedure: PORT PLACEMENT;  Surgeon: Erroll Luna, MD;  Location: [place];  Service: General;  Laterality: Right;   Patient Active Problem List   Diagnosis Date Noted   Other fatigue 08/28/2022   Acute pain 08/28/2022   Genetic testing 06/01/2022   Family history of breast cancer 05/15/2022   Family history of pancreatic cancer 05/15/2022   Family  history of prostate cancer 05/15/2022   Port-A-Cath in place 04/28/2022   Malignant neoplasm of upper-outer quadrant of left breast in female, estrogen receptor positive ([place]) 03/29/2022    PCP: Sidney Ace, MD  REFERRING PROVIDER: Nicholas Lose, MD  REFERRING DIAG: Left Breast Cancer  THERAPY DIAG:  Stiffness of L shoulder Stiffness of left shoulder, not elsewhere classified  Abnormal posture  Malignant neoplasm of upper-outer quadrant of left female breast, unspecified estrogen receptor status ([place])  Rationale for Evaluation and Treatment: Rehabilitation  ONSET DATE: 03/14/2022  SUBJECTIVE:  SUBJECTIVE STATEMENT:    The redness seems to be gone. I am itchy under the breast and hurting a little under the incision. The cording is still there but I think it is better.  PERTINENT HISTORY:  03/14/2022:Screening detected left breast masses 1.5 cm and 1.3 cm with enlarged left axillary lymph node, biopsy revealed grade 3 IDC with lymph node being positive, ER 95%, PR 0%, HER2 2+ by IHC and FISH positive with a ratio 2.45 and a copy #4.9, the second biopsy was HER2 negative with a ratio  1. Neoadjuvant chemotherapy with Herceptin Perjeta and letrozole for 8 cycles.  (Our original recommendation is TCHP x6 cycles but patient is not willing to go through chemo) 2. left lumpectomy and ALND 09/20/2022: 0.6 cm IDC 2/14 lymph nodes, margins negative, ER 95%, PR 0%, HER2 positive 3. Followed by adjuvant radiation therapy  4.  Adjuvant antiestrogen therapy with neratinib She had left lumpectomy on 09/20/2022 with 2/14 LN's  PATIENT GOALS:  Reassess how my recovery is going related to arm function, pain, and swelling.  PAIN:  Are you having pain? 3-4/10 tenderness and I think my bra rubs on it around my axillary  incision  PRECAUTIONS: Recent Surgery, left UE Lymphedema risk,   ACTIVITY LEVEL / LEISURE: not yet,   OBJECTIVE:   PATIENT SURVEYS:   11/03/2022:   30 second sit to stand 12 reps with 1/10 RPE  pt felt she could do more  QUICK DASH: 34.09  OBSERVATIONS: Incisions healed with glue still present both incisions. Mild firmness under breast incision and foam pad made to place over it in bra. Several cords visible and palpable in axillary region and running to medial elbow.  POSTURE:  Forward head rounded shoulders  LYMPHEDEMA ASSESSMENT:   UPPER EXTREMITY AROM/PROM:   A/PROM RIGHT   eval    Shoulder extension 47  Shoulder flexion 163  Shoulder abduction 180  Shoulder internal rotation 67  Shoulder external rotation 107                          (Blank rows = not tested)   A/PROM LEFT   eval LEFT 10/19/2022 LEFT 10/24/22 LEFT 10/31/2022 LEFT  11/03/2022 LEFT 11/30/2022  Shoulder extension 60 40 68   60  Shoulder flexion 165 142 147 162 165 167  Shoulder abduction 180 74 114 143 163 180  Shoulder internal rotation 57     68  Shoulder external rotation 107     108                          (Blank rows = not tested)     CERVICAL AROM: All within normal limits:          UPPER EXTREMITY STRENGTH: WNL right, Left NT due to sx     LYMPHEDEMA ASSESSMENTS:    LANDMARK RIGHT   Eval 03/31/2022 RIGHT 10/19/2022  10 cm proximal to olecranon process 24.0 22.7  Olecranon process 22.3 21.6  10 cm proximal to ulnar styloid process 20.9 20.4  Just proximal to ulnar styloid process 14.6 14.35  Across hand at thumb web space 19.3 19.0  At base of 2nd digit 5.9 5.6  (Blank rows = not tested)   [facility] LEFT   Eval 03/31/2022 Left 10/19/2022  10 cm proximal to olecranon process 23.6 22.8  Olecranon process 21.6 21.6  10 cm proximal to ulnar styloid process 19.5 18.9  Just  proximal to ulnar styloid process 14.15 14.5  Across hand at thumb web space 18.45 18.0  At base of  2nd digit 5.7 5.4  (Blank rows = not tested)      Surgery type/Date: Left lumpectomy with SLNB Number of lymph nodes removed: 2/14 Current/past treatment (chemo, radiation, hormone therapy): Herceptin, radiation pending, anti estrogens Other symptoms:  Heaviness/tightness Yes Pain Yes Pitting edema No Infections No Decreased scar mobility No Stemmer sign No  TREATMENT PERFORMED TODAY:  12/19/2022  Foam pad in TG soft made and placed at left axillary border of bra Soft tissue mobilization left , UT, lats , lower ribs and SL scapular region  with cocoa butter, avoiding radiated areas MFR to left UE cording at axilla and medial arm and forearm, NTS Scar mobilization to breast incision PROM Left shoulder flexion, scaption, abduction LTR bilaterally with arms extended and goal post x 4 ea Open book x 5 standing jobes flexion and scaption x 10 4D ball rolls x 10 Gave red band to progress to but advised to start with just 5 reps and test arms tolerance for exs, and to wear sleeve to be sure it feels comfortable  12/12/2022 Pts. Skin very red at the chest and axillary region as well as the upper back. Nipple appears very irritated. Did not think pt would tolerate soft tissue mobilization today. No noted swelling. Overhead pulleys x 2 min flexion and abduction Pt donned and doffed her compression sleeve and gauntlet independently and was shown how to use rubber gloves to assist. Discussed times she would wear garments MFR to left UE cording at axilla and medial arm and forearm, NTS PROM to left shoulder flexion, scaption, abd with pinning at axilla off of radiated skin Lumbar rotation x 3 bilaterally 11/30/2022 Discussed and assessed goals Measured ROM for PN Soft tissue mobilization left , UT, lats , lower ribs and SL scapular region  with cocoa butter, avoiding radiated areas MFR to left UE cording at axilla and medial arm and forearm, NTS Scar mobilization to breast incision PROM  Left shoulder flexion, scaption, abduction   11/28/2022 Pulleys x 1:30 flexion and abduction Soft tissue mobilization left , UT, lats  and SL scapular region  bilaterally with cocoa butter, avoiding radiated areas MFR to left UE cording at axilla and medial arm and forearm, NTS PROM Left shoulder flexion, scaption, abduction Supine Bilateral shoulder AROM Flexion x 10, scaption x 5, horizontal abd x 5 Standing scapular retraciton, shoulder extension,bilateral ER x 10  11/23/2022 Overhead pulleys x 1:30 flexion, scaption, abduction Soft tissue mobilization left pectorals, UT, lats with cocoa butter MFR to left UE cording at axilla and medial arm and forearm, NTS PROM Left shoulder flexion, scaption, abduction Supine bilateral shoulder AROM flexion, scaption, abduction x 5 ea Standing scapular retraction, extension, bilateral ER yellow x 10  11/14/2022 Overhead pulleys for shoulder flexion and abducton x 2 min Ball roll up wall into flexion and Lt UE abduction x10 Modified downward dog on wall 5x, 5 sec   To supine for PROM to left shoulder into external rotation, flexion and aduction with good stretch  Manual work to left arm with cocoa butter along cording path with longitudinal stretch and S stroke to soft tissue to bend cord. Radiaoplex gel gently to pt axilla and below breast per her request with gentle stationary circles and skin stretch on abdomen and lateral chest to support lymphatic flow.   11/09/22: Therapeutic Exercises Overhead pulleys for shoulder flexion and abducton x 2  min Ball roll up wall into flexion and Lt UE abduction x10 each returning therapist demo Modified downward dog on wall 5x, 5 sec holds returning therapist demo Supine over half roll for bil horz abd and then bil UE "V" into scaption x10 each  Reviewed supine scapular series with yellow theraband x5 each, mild pain reported with narrow grip flexion so encouraged pt to limit ROM to stopping before pain Manual  Therapy PROM left shoulder flexion, abduction, and D2 with scapular depression throughout MFR: To Lt axilla and into medial upper arm to antecubital fossa along cord  11/07/2022  Overhead pulleys for shoulder flexion and abducton x 2 min Supine wand flexion and scaption x 4,  PROM left shoulder flexion, scaption, abduction MFR techniques with longitudinal stretch and S technique to decrease cording Left upper extremity Supine scapular series with yellow band x 5 ea Tried LTR but held as pt did not feel stretching with this today. Updated HEP and gave yellow band   11/03/2022 Sitting: scapular and upper thoracic ROM, Pt noting that she feels tight with thoracic rotation.  Pulley exercises for shoulder flexion, abduction and scaption with cues to feel stretch down into sidebody.  To supine for PROM to left shoulder into external rotation, flexion and aduction with good stretch  and empty end feel. Pt with some stretch in axilla  with cord visible occasional pull into  upper arm. To sidelying for active abduction, flexion and small circles with hand pointed to ceiling and backward thoracic rotation.To supine for lower trunk rotation and bridges.  Sit to stand in 30 sec. Test for 12 reps.       PATIENT EDUCATION:  Access Code: [email or web address] URL: [email or web address] Date: 11/28/2022 Prepared by: Cheral Almas  Exercises - Scapular Retraction with Resistance  - 1 x daily - 2-3 x weekly - 1 sets - 10 reps - Scapular Retraction with Resistance Advanced  - 1 x daily - 2-3 x weekly - 1 sets - 10 reps - Shoulder External Rotation and Scapular Retraction with Resistance  - 1 x daily - 2-3 x weekly - 10 reps Education details: supine flexion and stargazer, standing wall slide, sitting or standing scapular retraction. Person educated: Patient Education method: Explanation, Demonstration, and Handouts Education comprehension: verbalized understanding and returned demonstration  HOME  EXERCISE PROGRAM: Reviewed previously given post op HEP. Practiced each x 5-7 reps and gave pt new handout secondary to misplacing hers.  ASSESSMENT:  CLINICAL IMPRESSION:  Pts skin improved and able to resume soft tissue mobilization with multiple shortened areas noted in left pectorals, lats and scapular region. Cording still present and radiating to  elbow but not really radiating interfering with ROM.  Pt will benefit from skilled therapeutic intervention to improve on the following deficits: Decreased knowledge of precautions, impaired UE functional use, pain, decreased ROM, postural dysfunction.   PT treatment/interventions: ADL/Self care home management, Therapeutic exercises, Therapeutic activity, Neuromuscular re-education, Patient/Family education, Self Care, Orthotic/Fit training, scar mobilization, Manual therapy, and Re-evaluation   GOALS: Goals reviewed with patient? Yes  LONG TERM GOALS:  (STG=LTG)  GOALS Name Target Date  Goal status  1 Pt will demonstrate she has regained full shoulder ROM and function post operatively compared to baselines.  Baseline: 11/30/2022 MET  2 Pt will have quick dash no greater than 15% to demonstrate improved function 11/30/2022 MET  3 Pt will have improved pain by 50% or greater 11/30/2022 MET  4 Pt will be fit for a compression sleeve/gauntlet and will be  independent in donning and doffing 12/28/2022 MET 12/12/2022  5 Pt will have decreased complaints of tightness from cording in axillary at end ROM 12/28/2022 NEW     PLAN:  PT FREQUENCY/DURATION: 1X/week x 4weeks  PLAN FOR NEXT SESSION: SOZO, check skin, Review supine scapular series prn. ,Continue pulleys, ball, STM left UQ, AAROM, PROM prn, MFR to cording, scar mobs ,  update HEP prn,   Cheral Almas, PT 12/19/22 12:00 PM   [facility]  [address] [place] [address]  [place] [address]  [phone]

## 2022-12-20 ENCOUNTER — Ambulatory Visit: Payer: BC Managed Care – PPO

## 2022-12-21 ENCOUNTER — Ambulatory Visit: Payer: BC Managed Care – PPO

## 2022-12-22 ENCOUNTER — Ambulatory Visit: Payer: BC Managed Care – PPO

## 2022-12-25 ENCOUNTER — Ambulatory Visit: Payer: BC Managed Care – PPO

## 2022-12-26 ENCOUNTER — Ambulatory Visit: Payer: BC Managed Care – PPO

## 2022-12-26 DIAGNOSIS — M25612 Stiffness of left shoulder, not elsewhere classified: Secondary | ICD-10-CM

## 2022-12-26 DIAGNOSIS — C50412 Malignant neoplasm of upper-outer quadrant of left female breast: Secondary | ICD-10-CM

## 2022-12-26 DIAGNOSIS — R293 Abnormal posture: Secondary | ICD-10-CM

## 2022-12-26 NOTE — Therapy (Signed)
OUTPATIENT PHYSICAL THERAPY BREAST CANCER TREATMENT   Patient Name: Caitlyn Torres MRN: [id number] DOB:December 24, 1960, 62 y.o., female Today's Date: 12/26/2022  END OF SESSION:  PT End of Session - 12/26/22 1007     Visit Number 14    Number of Visits 15    Date for PT Re-Evaluation 12/28/22    PT Start Time 1008    PT Stop Time [id number]    PT Time Calculation (min) 44 min    Activity Tolerance Patient tolerated treatment well    Behavior During Therapy [facility] for tasks assessed/performed               Past Medical History:  Diagnosis Date   Anxiety    Arthritis    Cancer ([place]) 03/2022   L breast   Depression    Family history of breast cancer 05/15/2022   Family history of pancreatic cancer 05/15/2022   Family history of prostate cancer 05/15/2022   GERD (gastroesophageal reflux disease)    History of hiatal hernia    patient believes she may have been told she has this years ago   Past Surgical History:  Procedure Laterality Date   BREAST BIOPSY  09/19/2022   MM LT RADIOACTIVE SEED EA ADD LESION LOC MAMMO GUIDE 09/19/2022 [facility] MAMMOGRAP[facility]   BREAST BIOPSY  09/19/2022   MM LT RADIOACTIVE SEED LOC MAMMO GUIDE 09/19/2022 [facility] MAMMOGRAP[facility]   BREAST LUMPECTOMY WITH RADIOACTIVE SEED AND AXILLARY LYMPH NODE DISSECTION Left 09/20/2022   Procedure: LEFT BREAST SEED LUMPECTOMY, LEFT AXILLARY LYMPH NODE DISSECTION;  Surgeon: Erroll Luna, MD;  Location: [place];  Service: General;  Laterality: Left;  GEN & PEC BLOCK   CERVIX SURGERY     Laser removal of pre-cancer cells   LASIK Bilateral    PORTACATH PLACEMENT Right 04/20/2022   Procedure: PORT PLACEMENT;  Surgeon: Erroll Luna, MD;  Location: [place];  Service: General;  Laterality: Right;   Patient Active Problem List   Diagnosis Date Noted   Other fatigue 08/28/2022   Acute pain 08/28/2022   Genetic testing 06/01/2022   Family history of breast cancer 05/15/2022   Family history of pancreatic cancer 05/15/2022   Family  history of prostate cancer 05/15/2022   Port-A-Cath in place 04/28/2022   Malignant neoplasm of upper-outer quadrant of left breast in female, estrogen receptor positive ([place]) 03/29/2022    PCP: Sidney Ace, MD  REFERRING PROVIDER: Nicholas Lose, MD  REFERRING DIAG: Left Breast Cancer  THERAPY DIAG:  Stiffness of L shoulder Stiffness of left shoulder, not elsewhere classified  Abnormal posture  Malignant neoplasm of upper-outer quadrant of left female breast, unspecified estrogen receptor status ([place])  Rationale for Evaluation and Treatment: Rehabilitation  ONSET DATE: 03/14/2022  SUBJECTIVE:  SUBJECTIVE STATEMENT:    My left arm was a little numb and tingly both days of the weekend for some reason. I put my sleeve on and maybe it helped some. I didn't notice anything yesterday or today. It felt almost like a neuropathy;it was annoying. The cords seem to be OK. The foam at the armpit helped.  PERTINENT HISTORY:  03/14/2022:Screening detected left breast masses 1.5 cm and 1.3 cm with enlarged left axillary lymph node, biopsy revealed grade 3 IDC with lymph node being positive, ER 95%, PR 0%, HER2 2+ by IHC and FISH positive with a ratio 2.45 and a copy #4.9, the second biopsy was HER2 negative with a ratio  1. Neoadjuvant chemotherapy with Herceptin Perjeta and letrozole for 8 cycles.  (Our original recommendation is TCHP x6 cycles but patient is not willing to go through chemo) 2. left lumpectomy and ALND 09/20/2022: 0.6 cm IDC 2/14 lymph nodes, margins negative, ER 95%, PR 0%, HER2 positive 3. Followed by adjuvant radiation therapy  4.  Adjuvant antiestrogen therapy with neratinib She had left lumpectomy on 09/20/2022 with 2/14 LN's  PATIENT GOALS:  Reassess how my recovery is going related to arm  function, pain, and swelling.  PAIN:  Are you having pain? 2/10, left posterior arm, left lateral breast  PRECAUTIONS: Recent Surgery, left UE Lymphedema risk,   ACTIVITY LEVEL / LEISURE: not yet,   OBJECTIVE:   PATIENT SURVEYS:   11/03/2022:   30 second sit to stand 12 reps with 1/10 RPE  pt felt she could do more  QUICK DASH: 34.09  OBSERVATIONS: Incisions healed with glue still present both incisions. Mild firmness under breast incision and foam pad made to place over it in bra. Several cords visible and palpable in axillary region and running to medial elbow.  POSTURE:  Forward head rounded shoulders  LYMPHEDEMA ASSESSMENT:   UPPER EXTREMITY AROM/PROM:   A/PROM RIGHT   eval    Shoulder extension 47  Shoulder flexion 163  Shoulder abduction 180  Shoulder internal rotation 67  Shoulder external rotation 107                          (Blank rows = not tested)   A/PROM LEFT   eval LEFT 10/19/2022 LEFT 10/24/22 LEFT 10/31/2022 LEFT  11/03/2022 LEFT 11/30/2022 LEFT 12/26/2022  Shoulder extension 60 40 68   60 59  Shoulder flexion 165 142 147 162 165 167 167  Shoulder abduction 180 74 114 143 163 180 180  Shoulder internal rotation 57     68 67  Shoulder external rotation 107     108 111                          (Blank rows = not tested)     CERVICAL AROM: All within normal limits:          UPPER EXTREMITY STRENGTH: WNL right, Left NT due to sx     LYMPHEDEMA ASSESSMENTS:    LANDMARK RIGHT   Eval 03/31/2022 RIGHT 10/19/2022  10 cm proximal to olecranon process 24.0 22.7  Olecranon process 22.3 21.6  10 cm proximal to ulnar styloid process 20.9 20.4  Just proximal to ulnar styloid process 14.6 14.35  Across hand at thumb web space 19.3 19.0  At base of 2nd digit 5.9 5.6  (Blank rows = not tested)   [facility] LEFT   Eval 03/31/2022 Left 10/19/2022  10  cm proximal to olecranon process 23.6 22.8  Olecranon process 21.6 21.6  10 cm proximal to ulnar  styloid process 19.5 18.9  Just proximal to ulnar styloid process 14.15 14.5  Across hand at thumb web space 18.45 18.0  At base of 2nd digit 5.7 5.4  (Blank rows = not tested)      Surgery type/Date: Left lumpectomy with SLNB Number of lymph nodes removed: 2/14 Current/past treatment (chemo, radiation, hormone therapy): Herceptin, radiation pending, anti estrogens Other symptoms:  Heaviness/tightness Yes Pain Yes Pitting edema No Infections No Decreased scar mobility No Stemmer sign No  TREATMENT PERFORMED TODAY: 12/26/2022  Performed SOZO; well within Range Soft tissue mobilization left , UT, lats , lower ribs MFR to left UE cording at axilla and medial upper am, left lower ribs Scar mobilization to breast incision PROM Left shoulder flexion, scaption, abduction Checked ROM and goals;gave lymphedema handout Had pt set up March ABC class and 3 month SOZO screen  12/19/2022  Foam pad in TG soft made and placed at left axillary border of bra Soft tissue mobilization left , UT, lats , lower ribs and SL scapular region  with cocoa butter, avoiding radiated areas MFR to left UE cording at axilla and medial arm and forearm, NTS Scar mobilization to breast incision PROM Left shoulder flexion, scaption, abduction LTR bilaterally with arms extended and goal post x 4 ea Open book x 5 standing jobes flexion and scaption x 10 4D ball rolls x 10 Gave red band to progress to but advised to start with just 5 reps and test arms tolerance for exs, and to wear sleeve to be sure it feels comfortable    11/28/2022 Pulleys x 1:30 flexion and abduction Soft tissue mobilization left , UT, lats  and SL scapular region  bilaterally with cocoa butter, avoiding radiated areas MFR to left UE cording at axilla and medial arm and forearm, NTS PROM Left shoulder flexion, scaption, abduction Supine Bilateral shoulder AROM Flexion x 10, scaption x 5, horizontal abd x 5 Standing scapular retraciton,  shoulder extension,bilateral ER x 10  11/23/2022 Overhead pulleys x 1:30 flexion, scaption, abduction Soft tissue mobilization left pectorals, UT, lats with cocoa butter MFR to left UE cording at axilla and medial arm and forearm, NTS PROM Left shoulder flexion, scaption, abduction Supine bilateral shoulder AROM flexion, scaption, abduction x 5 ea Standing scapular retraction, extension, bilateral ER yellow x 10  11/14/2022 Overhead pulleys for shoulder flexion and abducton x 2 min Ball roll up wall into flexion and Lt UE abduction x10 Modified downward dog on wall 5x, 5 sec   To supine for PROM to left shoulder into external rotation, flexion and aduction with good stretch  Manual work to left arm with cocoa butter along cording path with longitudinal stretch and S stroke to soft tissue to bend cord. Radiaoplex gel gently to pt axilla and below breast per her request with gentle stationary circles and skin stretch on abdomen and lateral chest to support lymphatic flow.   11/09/22: Therapeutic Exercises Overhead pulleys for shoulder flexion and abducton x 2 min Ball roll up wall into flexion and Lt UE abduction x10 each returning therapist demo Modified downward dog on wall 5x, 5 sec holds returning therapist demo Supine over half roll for bil horz abd and then bil UE "V" into scaption x10 each  Reviewed supine scapular series with yellow theraband x5 each, mild pain reported with narrow grip flexion so encouraged pt to limit ROM to stopping before  pain Manual Therapy PROM left shoulder flexion, abduction, and D2 with scapular depression throughout MFR: To Lt axilla and into medial upper arm to antecubital fossa along cord  1PATIENT EDUCATION:  Access Code: [email or web address] URL: [email or web address] Date: 11/28/2022 Prepared by: Cheral Almas  Exercises - Scapular Retraction with Resistance  - 1 x daily - 2-3 x weekly - 1 sets - 10 reps - Scapular Retraction with Resistance  Advanced  - 1 x daily - 2-3 x weekly - 1 sets - 10 reps - Shoulder External Rotation and Scapular Retraction with Resistance  - 1 x daily - 2-3 x weekly - 10 reps Education details: supine flexion and stargazer, standing wall slide, sitting or standing scapular retraction. Person educated: Patient Education method: Explanation, Demonstration, and Handouts Education comprehension: verbalized understanding and returned demonstration  HOME EXERCISE PROGRAM: Reviewed previously given post op HEP. Practiced each x 5-7 reps and gave pt new handout secondary to misplacing hers.  ASSESSMENT:  CLINICAL IMPRESSION:  Pt has achieved all established goals . She is independent and compliant with a HEP and her pain is greater than 50% improved.   She has her sleeve and gauntlet for repetitive motion activities, flying etc. She has full ROM and function in the left shoulder.  She did have an episode of increased numbness in the left UE over the weekend which has since resolved. She has a single cord still noted in the axilla that does not interfere with her mobility. She was advised to contact us with any questions or concerns she may have.  Pt will benefit from skilled therapeutic intervention to improve on the following deficits: Decreased knowledge of precautions, impaired UE functional use, pain, decreased ROM, postural dysfunction.   PT treatment/interventions: ADL/Self care home management, Therapeutic exercises, Therapeutic activity, Neuromuscular re-education, Patient/Family education, Self Care, Orthotic/Fit training, scar mobilization, Manual therapy, and Re-evaluation   GOALS: Goals reviewed with patient? Yes  LONG TERM GOALS:  (STG=LTG)  GOALS Name Target Date  Goal status  1 Pt will demonstrate she has regained full shoulder ROM and function post operatively compared to baselines.  Baseline: 11/30/2022 MET  2 Pt will have quick dash no greater than 15% to demonstrate improved function  11/30/2022 MET  3 Pt will have improved pain by 50% or greater 11/30/2022 MET  4 Pt will be fit for a compression sleeve/gauntlet and will be independent in donning and doffing 12/28/2022 MET 12/12/2022  5 Pt will have decreased complaints of tightness from cording in axillary at end ROM 12/28/2022 MET  12/26/2022     PLAN:  PT FREQUENCY/DURATION: 1X/week x 4weeks  PLAN FOR NEXT SESSION:  DC to independent self management  Cheral Almas, PT 12/26/22 10:59 AM   [facility]  [address]  [place] [address]  [phone]

## 2022-12-27 ENCOUNTER — Other Ambulatory Visit: Payer: Self-pay

## 2023-01-01 ENCOUNTER — Telehealth: Payer: BC Managed Care – PPO | Admitting: Physician Assistant

## 2023-01-01 DIAGNOSIS — H1031 Unspecified acute conjunctivitis, right eye: Secondary | ICD-10-CM | POA: Diagnosis not present

## 2023-01-01 MED ORDER — OFLOXACIN 0.3 % OP SOLN: 1.0000 [drp] | Freq: Four times a day (QID) | OPHTHALMIC | 0 refills | Status: AC

## 2023-01-01 NOTE — Progress Notes (Signed)
E-Visit for [employer]   We are sorry that you are not feeling well.  Here is how we plan to help!  Based on what you have shared with me it looks like you have conjunctivitis.  Conjunctivitis is a common inflammatory or infectious condition of the eye that is often referred to as "pink eye".  In most cases it is contagious (viral or bacterial). However, not all conjunctivitis requires antibiotics (ex. Allergic).  We have made appropriate suggestions for you based upon your presentation.  I have prescribed Oflaxacin 1-2 drops 4 times a day times 5 days   Pink eye can be highly contagious.  It is typically spread through direct contact with secretions, or contaminated objects or surfaces that one may have touched.  Strict handwashing is suggested with soap and water is urged.  If not available, use alcohol based had sanitizer.  Avoid unnecessary touching of the eye.  If you wear contact lenses, you will need to refrain from wearing them until you see no white discharge from the eye for at least 24 hours after being on medication.  You should see symptom improvement in 1-2 days after starting the medication regimen.  Call us if symptoms are not improved in 1-2 days.  Home Care: Wash your hands often! Do not wear your contacts until you complete your treatment plan. Avoid sharing towels, bed linen, personal items with a person who has pink eye. See attention for anyone in your home with similar symptoms.  Get Help Right Away If: Your symptoms do not improve. You develop blurred or loss of vision. Your symptoms worsen (increased discharge, pain or redness)   Thank you for choosing an e-visit.  Your e-visit answers were reviewed by a board certified advanced clinical practitioner to complete your personal care plan. Depending upon the condition, your plan could have included both over the counter or prescription medications.  Please review your pharmacy choice. Make sure the pharmacy is open so  you can pick up prescription now. If there is a problem, you may contact your provider through [employer] and have the prescription routed to another pharmacy.  Your safety is important to [place]. If you have drug allergies check your prescription carefully.   For the next 24 hours you can use MyChart to ask questions about today's visit, request a non-urgent call back, or ask for a work or school excuse. You will get an email in the next two days asking about your experience. I hope that your e-visit has been valuable and will speed your recovery.

## 2023-01-01 NOTE — Progress Notes (Signed)
I have spent 5 minutes in review of e-visit questionnaire, review and updating patient chart, medical decision making and response to patient.   Fadi Menter Cody Allysen Lazo, PA-C    

## 2023-01-03 ENCOUNTER — Encounter: Payer: Self-pay | Admitting: *Deleted

## 2023-01-03 ENCOUNTER — Encounter: Payer: Self-pay | Admitting: Hematology and Oncology

## 2023-01-03 NOTE — Progress Notes (Signed)
Received mychart message from pt stating she has tested positive for covid today. Pt experiencing body aches and fever of 101.8 that was alleviated with OTC tylenol. Per MD pt tx needing to be held for 21 days and continue tylenol q 4-6 hours and to alert our office if fever is present despite tylenol.  Pt verbalized understanding.  Message sent to scheduling team to reschedule appts.

## 2023-01-03 NOTE — Progress Notes (Incomplete)
Patient Care Team: Bradd Canary, MD as PCP - General (Family Medicine) Mauro Kaufmann, RN as Oncology Nurse Navigator Rockwell Germany, RN as Oncology Nurse Navigator Nicholas Lose, MD as Consulting Physician (Hematology and Oncology)  DIAGNOSIS: No diagnosis found.  SUMMARY OF ONCOLOGIC HISTORY: Oncology History  Malignant neoplasm of upper-outer quadrant of left breast in female, estrogen receptor positive ([place])  03/14/2022 Initial Diagnosis   Screening detected left breast masses 1.5 cm and 1.3 cm with enlarged left axillary lymph node, biopsy revealed grade 3 IDC with lymph node being positive, ER 95%, PR 0%, HER2 2+ by IHC and FISH positive with a ratio 2.45 and a copy #4.9, the second biopsy was HER2 negative with a ratio 2.15 and a copy #3.55   03/29/2022 Cancer Staging   Staging form: Breast, AJCC 8th Edition - Clinical: Stage IIA (cT1c, cN1, cM0, G3, ER+, PR-, HER2+) - Signed by Nicholas Lose, MD on 03/29/2022 Stage prefix: Initial diagnosis Histologic grading system: 3 grade system   03/29/2022 -  Anti-estrogen oral therapy   Letrozole daily   04/21/2022 -  Chemotherapy   Herceptin/Perjeta every 21 days initially x 3, will reassess with MRI and then will decide whether to continue or to add chemo.  (Original recommendation was TCHP x 6)      Genetic Testing   [facility] CancerNext-Expanded Panel was Negative. Report date is 05/30/2022.  The CancerNext-Expanded gene panel offered by [facility] and includes sequencing, rearrangement, and RNA analysis for the following 77 genes: AIP, ALK, APC, ATM, AXIN2, BAP1, BARD1, BLM, BMPR1A, BRCA1, BRCA2, BRIP1, CDC73, CDH1, CDK4, CDKN1B, CDKN2A, CHEK2, CTNNA1, DICER1, FANCC, FH, FLCN, GALNT12, KIF1B, LZTR1, MAX, MEN1, MET, MLH1, MSH2, MSH3, MSH6, MUTYH, NBN, NF1, NF2, NTHL1, PALB2, PHOX2B, PMS2, POT1, PRKAR1A, PTCH1, PTEN, RAD51C, RAD51D, RB1, RECQL, RET, SDHA, SDHAF2, SDHB, SDHC, SDHD, SMAD4, SMARCA4, SMARCB1, SMARCE1, STK11, SUFU,  TMEM127, TP53, TSC1, TSC2, VHL and XRCC2 (sequencing and deletion/duplication); EGFR, EGLN1, HOXB13, KIT, MITF, PDGFRA, POLD1, and POLE (sequencing only); EPCAM and GREM1 (deletion/duplication only).    04/21/2022 - 09/14/2022 Chemotherapy   Patient is on Treatment Plan : BREAST Trastuzumab  + Pertuzumab q21d x 13 cycles     10/13/2022 -  Chemotherapy   Patient is on Treatment Plan : BREAST ADO-Trastuzumab Emtansine (Kadcyla) q21d       CHIEF COMPLIANT: Kadcyla cycle 5  INTERVAL HISTORY: Caitlyn Torres is a 62 year old above-mentioned history of HER2 positive breast cancer was currently on Kadcyla. She presents to the clinic for a follow-up and cycle 5 Kadcyla.     ALLERGIES:  is allergic to aleve [naproxen sodium], aspirin, macrobid [nitrofurantoin], motrin [ibuprofen], and tylenol [acetaminophen].  MEDICATIONS:  Current Outpatient Medications  Medication Sig Dispense Refill   benzonatate (TESSALON) 100 MG capsule Take 1 capsule (100 mg total) by mouth 3 (three) times daily as needed. 30 capsule 0   Brimonidine Tartrate (LUMIFY) 0.025 % SOLN Place 1 drop into both eyes daily as needed (red eyes).     carboxymethylcellulose (REFRESH PLUS) 0.5 % SOLN Place 1 drop into both eyes 3 (three) times daily as needed (dry eyes).     cetirizine (ZYRTEC) 10 MG tablet Take 10 mg by mouth daily.     fluticasone (FLONASE) 50 MCG/ACT nasal spray Place 2 sprays into both nostrils daily. 16 g 0   lidocaine (XYLOCAINE) 2 % solution Swish and swallow 23m every 4 hours as needed for sore throat 100 mL 0   Lidocaine-Prilocaine &Lido HCl 2.5-2.5 & 3.88 %  KIT APPLY TO AFFECTED AREA ONCE AS DIRECTED     magic mouthwash w/lidocaine SOLN Take 5 mLs by mouth 4 (four) times daily as needed for mouth pain. 240 mL 1   Multiple Vitamins-Iron (CHLORELLA) CAPS Take 1 capsule by mouth daily.     ofloxacin (OCUFLOX) 0.3 % ophthalmic solution Place 1 drop into the right eye 4 (four) times daily for 5 days. 5 mL 0    ondansetron (ZOFRAN) 8 MG tablet Take 1 tablet (8 mg total) by mouth every 8 (eight) hours as needed for nausea or vomiting. 30 tablet 1   OVER THE COUNTER MEDICATION Take 1 capsule by mouth daily. Amla     OVER THE COUNTER MEDICATION Take 1 capsule by mouth daily. Mushroom Complex     OVER THE COUNTER MEDICATION Place 1 Application vaginally daily as needed (moisture). Femininity     QUERCETIN PO Take 1 capsule by mouth daily.     sodium chloride (OCEAN) 0.65 % SOLN nasal spray Place 1 spray into both nostrils as needed for congestion.     TURMERIC CURCUMIN PO Take 1 capsule by mouth daily.     No current facility-administered medications for this visit.    PHYSICAL EXAMINATION: ECOG PERFORMANCE STATUS: {CHL ONC ECOG PS:[id number]}  There were no vitals filed for this visit. There were no vitals filed for this visit.  BREAST:*** No palpable masses or nodules in either right or left breasts. No palpable axillary supraclavicular or infraclavicular adenopathy no breast tenderness or nipple discharge. (exam performed in the presence of a chaperone)  LABORATORY DATA:  I have reviewed the data as listed    Latest Ref Rng & Units 12/15/2022   12:09 PM 11/24/2022    7:47 AM 11/02/2022    8:07 AM  CMP  Glucose 70 - 99 mg/dL 92  86  100   BUN 6 - 20 mg/dL 12  6  7   $ Creatinine 0.44 - 1.00 mg/dL 0.53  0.45  0.65   Sodium 135 - 145 mmol/L 138  133  139   Potassium 3.5 - 5.1 mmol/L 3.9  3.8  3.9   Chloride 98 - 111 mmol/L 104  100  105   CO2 22 - 32 mmol/L 29  28  29   $ Calcium 8.9 - 10.3 mg/dL 9.6  9.2  9.8   Total Protein 6.5 - 8.1 g/dL 6.6  6.7  6.8   Total Bilirubin 0.3 - 1.2 mg/dL 0.4  0.3  0.4   Alkaline Phos 38 - 126 U/L 67  58  63   AST 15 - 41 U/L 38  18  51   ALT 0 - 44 U/L 42  20  75     Lab Results  Component Value Date   WBC 3.8 (L) 12/15/2022   HGB 13.3 12/15/2022   HCT 38.0 12/15/2022   MCV 83.7 12/15/2022   PLT 179 12/15/2022   NEUTROABS 2.5 12/15/2022     ASSESSMENT & PLAN:  No problem-specific Assessment & Plan notes found for this encounter.    No orders of the defined types were placed in this encounter.  The patient has a good understanding of the overall plan. she agrees with it. she will call with any problems that may develop before the next visit here. Total time spent: 30 mins including face to face time and time spent for planning, charting and co-ordination of care   Suzzette Righter, [place] 01/03/23    I Gardiner Coins am  acting as a Education administrator for Dr.Vinay [employer]  ***

## 2023-01-04 ENCOUNTER — Inpatient Hospital Stay: Payer: BC Managed Care – PPO

## 2023-01-04 ENCOUNTER — Inpatient Hospital Stay: Payer: BC Managed Care – PPO | Admitting: Hematology and Oncology

## 2023-01-18 ENCOUNTER — Encounter: Payer: Self-pay | Admitting: Rehabilitation

## 2023-01-18 ENCOUNTER — Telehealth: Payer: Self-pay | Admitting: *Deleted

## 2023-01-18 NOTE — Telephone Encounter (Signed)
Per MD request, RN placed call to pt regarding negative (Not Detected) recent [employer] testing.  No answer, LVM for pt to return call to the office.  

## 2023-01-22 ENCOUNTER — Encounter: Payer: Self-pay | Admitting: Hematology and Oncology

## 2023-01-23 ENCOUNTER — Telehealth: Payer: Self-pay | Admitting: Hematology and Oncology

## 2023-01-23 NOTE — Telephone Encounter (Signed)
Rescheduled and cancelled per room/resource and provider PAL. Left voicemail.

## 2023-01-26 ENCOUNTER — Inpatient Hospital Stay: Payer: BC Managed Care – PPO | Admitting: Hematology and Oncology

## 2023-01-26 ENCOUNTER — Inpatient Hospital Stay: Payer: BC Managed Care – PPO

## 2023-01-26 ENCOUNTER — Inpatient Hospital Stay: Payer: BC Managed Care – PPO | Attending: Hematology and Oncology

## 2023-01-26 VITALS — BP 127/60 | HR 63 | Temp 98.2°F | Resp 16 | Wt 118.8 lb

## 2023-01-26 DIAGNOSIS — C50412 Malignant neoplasm of upper-outer quadrant of left female breast: Secondary | ICD-10-CM | POA: Insufficient documentation

## 2023-01-26 DIAGNOSIS — Z170 Estrogen receptor positive status [ER+]: Secondary | ICD-10-CM | POA: Diagnosis not present

## 2023-01-26 DIAGNOSIS — Z95828 Presence of other vascular implants and grafts: Secondary | ICD-10-CM

## 2023-01-26 DIAGNOSIS — Z5111 Encounter for antineoplastic chemotherapy: Secondary | ICD-10-CM | POA: Insufficient documentation

## 2023-01-26 DIAGNOSIS — Z79811 Long term (current) use of aromatase inhibitors: Secondary | ICD-10-CM | POA: Insufficient documentation

## 2023-01-26 LAB — CBC WITH DIFFERENTIAL (CANCER CENTER ONLY)
Abs Immature Granulocytes: 0 10*3/uL (ref 0.00–0.07)
Basophils Absolute: 0 10*3/uL (ref 0.0–0.1)
Basophils Relative: 1 %
Eosinophils Absolute: 0.1 10*3/uL (ref 0.0–0.5)
Eosinophils Relative: 3 %
HCT: 37 % (ref 36.0–46.0)
Hemoglobin: 12.7 g/dL (ref 12.0–15.0)
Immature Granulocytes: 0 %
Lymphocytes Relative: 21 %
Lymphs Abs: 0.7 10*3/uL (ref 0.7–4.0)
MCH: 29 pg (ref 26.0–34.0)
MCHC: 34.3 g/dL (ref 30.0–36.0)
MCV: 84.5 fL (ref 80.0–100.0)
Monocytes Absolute: 0.4 10*3/uL (ref 0.1–1.0)
Monocytes Relative: 13 %
Neutro Abs: 2 10*3/uL (ref 1.7–7.7)
Neutrophils Relative %: 62 %
Platelet Count: 161 10*3/uL (ref 150–400)
RBC: 4.38 MIL/uL (ref 3.87–5.11)
RDW: 15.8 % — ABNORMAL HIGH (ref 11.5–15.5)
WBC Count: 3.3 10*3/uL — ABNORMAL LOW (ref 4.0–10.5)
nRBC: 0 % (ref 0.0–0.2)

## 2023-01-26 LAB — CMP (CANCER CENTER ONLY)
ALT: 25 U/L (ref 0–44)
AST: 30 U/L (ref 15–41)
Albumin: 4.3 g/dL (ref 3.5–5.0)
Alkaline Phosphatase: 58 U/L (ref 38–126)
Anion gap: 5 (ref 5–15)
BUN: 10 mg/dL (ref 6–20)
CO2: 28 mmol/L (ref 22–32)
Calcium: 9.4 mg/dL (ref 8.9–10.3)
Chloride: 103 mmol/L (ref 98–111)
Creatinine: 0.59 mg/dL (ref 0.44–1.00)
GFR, Estimated: >60 mL/min (ref >60)
Glucose, Bld: 95 mg/dL (ref 70–99)
Potassium: 4.2 mmol/L (ref 3.5–5.1)
Sodium: 136 mmol/L (ref 135–145)
Total Bilirubin: 0.5 mg/dL (ref 0.3–1.2)
Total Protein: 6.9 g/dL (ref 6.5–8.1)

## 2023-01-26 MED ORDER — PROCHLORPERAZINE MALEATE 10 MG PO TABS
10.0000 mg | ORAL_TABLET | Freq: Once | ORAL | Status: AC
  Administered 2023-01-26: 10 mg via ORAL
  Filled 2023-01-26: qty 1

## 2023-01-26 MED ORDER — SODIUM CHLORIDE 0.9% FLUSH
10.0000 mL | INTRAVENOUS | Status: DC | PRN
  Administered 2023-01-26: 10 mL

## 2023-01-26 MED ORDER — SODIUM CHLORIDE 0.9 % IV SOLN
3.6000 mg/kg | Freq: Once | INTRAVENOUS | Status: AC
  Administered 2023-01-26: 200 mg via INTRAVENOUS
  Filled 2023-01-26: qty 10

## 2023-01-26 MED ORDER — SODIUM CHLORIDE 0.9 % IV SOLN: Freq: Once | INTRAVENOUS | Status: AC

## 2023-01-26 MED ORDER — HEPARIN SOD (PORK) LOCK FLUSH 100 UNIT/ML IV SOLN
500.0000 [IU] | Freq: Once | INTRAVENOUS | Status: AC | PRN
  Administered 2023-01-26: 500 [IU]

## 2023-01-26 MED ORDER — DIPHENHYDRAMINE HCL 25 MG PO CAPS
25.0000 mg | ORAL_CAPSULE | Freq: Once | ORAL | Status: AC
  Administered 2023-01-26: 25 mg via ORAL
  Filled 2023-01-26: qty 1

## 2023-01-26 MED ORDER — SODIUM CHLORIDE 0.9% FLUSH
10.0000 mL | Freq: Once | INTRAVENOUS | Status: AC
  Administered 2023-01-26: 10 mL

## 2023-01-26 NOTE — Patient Instructions (Signed)
[facility]  Discharge Instructions: Thank you for choosing [facility] to provide your oncology and hematology care.   If you have a lab appointment with the Cancer Center, please go directly to the Cancer Center and check in at the registration area.   Wear comfortable clothing and clothing appropriate for easy access to any Portacath or PICC line.   We strive to give you quality time with your provider. You may need to reschedule your appointment if you arrive late (15 or more minutes).  Arriving late affects you and other patients whose appointments are after yours.  Also, if you miss three or more appointments without notifying the office, you may be dismissed from the clinic at the provider's discretion.      For prescription refill requests, have your pharmacy contact our office and allow 72 hours for refills to be completed.    Today you received the following chemotherapy and/or immunotherapy agents: Kadcyla.       To help prevent nausea and vomiting after your treatment, we encourage you to take your nausea medication as directed.  BELOW ARE SYMPTOMS THAT SHOULD BE REPORTED IMMEDIATELY: *FEVER GREATER THAN 100.4 F (38 C) OR HIGHER *CHILLS OR SWEATING *NAUSEA AND VOMITING THAT IS NOT CONTROLLED WITH YOUR NAUSEA MEDICATION *UNUSUAL SHORTNESS OF BREATH *UNUSUAL BRUISING OR BLEEDING *URINARY PROBLEMS (pain or burning when urinating, or frequent urination) *BOWEL PROBLEMS (unusual diarrhea, constipation, pain near the anus) TENDERNESS IN MOUTH AND THROAT WITH OR WITHOUT PRESENCE OF ULCERS (sore throat, sores in mouth, or a toothache) UNUSUAL RASH, SWELLING OR PAIN  UNUSUAL VAGINAL DISCHARGE OR ITCHING   Items with * indicate a potential emergency and should be followed up as soon as possible or go to the Emergency Department if any problems should occur.  Please show the CHEMOTHERAPY ALERT CARD or IMMUNOTHERAPY ALERT CARD at  check-in to the Emergency Department and triage nurse.  Should you have questions after your visit or need to cancel or reschedule your appointment, please contact [facility]  Dept: [phone]  and follow the prompts.  Office hours are 8:00 a.m. to 4:30 p.m. Monday - Friday. Please note that voicemails left after 4:00 p.m. may not be returned until the following business day.  We are closed weekends and major holidays. You have access to a nurse at all times for urgent questions. Please call the main number to the clinic Dept: [phone] and follow the prompts.   For any non-urgent questions, you may also contact your provider using MyChart. We now offer e-Visits for anyone 18 and older to request care online for non-urgent symptoms. For details visit [email or web address].   Also download the MyChart app! Go to the app store, search "MyChart", open the app, select [facility], and log in with your MyChart username and password.   

## 2023-02-05 ENCOUNTER — Telehealth: Payer: Self-pay | Admitting: Hematology and Oncology

## 2023-02-05 NOTE — Telephone Encounter (Signed)
Scheduled appointments per WQ. Left voicemail. 

## 2023-02-07 NOTE — Progress Notes (Signed)
Patient Care Team: Carlean Jews, NP as PCP - General (Family Medicine) Pershing Proud, RN as Oncology Nurse Navigator Donnelly Angelica, RN as Oncology Nurse Navigator [name], MD as Consulting Physician (Hematology and Oncology)  DIAGNOSIS:  Encounter Diagnoses  Name Primary?   Malignant neoplasm of upper-outer quadrant of left breast in female, estrogen receptor positive Yes   Postmenopausal     SUMMARY OF ONCOLOGIC HISTORY: Oncology History  Malignant neoplasm of upper-outer quadrant of left breast in female, estrogen receptor positive  03/14/2022 Initial Diagnosis   Screening detected left breast masses 1.5 cm and 1.3 cm with enlarged left axillary lymph node, biopsy revealed grade 3 IDC with lymph node being positive, ER 95%, PR 0%, HER2 2+ by IHC and FISH positive with a ratio 2.45 and a copy #4.9, the second biopsy was HER2 negative with a ratio 2.15 and a copy #3.55   03/29/2022 Cancer Staging   Staging form: Breast, AJCC 8th Edition - Clinical: Stage IIA (cT1c, cN1, cM0, G3, ER+, PR-, HER2+) - Signed by [name], MD on 03/29/2022 Stage prefix: Initial diagnosis Histologic grading system: 3 grade system   03/29/2022 -  Anti-estrogen oral therapy   Letrozole daily   04/21/2022 -  Chemotherapy   Herceptin/Perjeta every 21 days initially x 3, will reassess with MRI and then will decide whether to continue or to add chemo.  (Original recommendation was TCHP x 6)      Genetic Testing   [facility] CancerNext-Expanded Panel was Negative. Report date is 05/30/2022.  The CancerNext-Expanded gene panel offered by [facility] and includes sequencing, rearrangement, and RNA analysis for the following 77 genes: AIP, ALK, APC, ATM, AXIN2, BAP1, BARD1, BLM, BMPR1A, BRCA1, BRCA2, BRIP1, CDC73, CDH1, CDK4, CDKN1B, CDKN2A, CHEK2, CTNNA1, DICER1, FANCC, FH, FLCN, GALNT12, KIF1B, LZTR1, MAX, MEN1, MET, MLH1, MSH2, MSH3, MSH6, MUTYH, NBN, NF1, NF2, NTHL1, PALB2, PHOX2B, PMS2, POT1,  PRKAR1A, PTCH1, PTEN, RAD51C, RAD51D, RB1, RECQL, RET, SDHA, SDHAF2, SDHB, SDHC, SDHD, SMAD4, SMARCA4, SMARCB1, SMARCE1, STK11, SUFU, TMEM127, TP53, TSC1, TSC2, VHL and XRCC2 (sequencing and deletion/duplication); EGFR, EGLN1, HOXB13, KIT, MITF, PDGFRA, POLD1, and POLE (sequencing only); EPCAM and GREM1 (deletion/duplication only).    04/21/2022 - 09/14/2022 Chemotherapy   Patient is on Treatment Plan : BREAST Trastuzumab  + Pertuzumab q21d x 13 cycles     10/13/2022 -  Chemotherapy   Patient is on Treatment Plan : BREAST ADO-Trastuzumab Emtansine (Kadcyla) q21d       CHIEF COMPLIANT: Kadcyla maintenance cycle 6   INTERVAL HISTORY: Caitlyn Torres is a 62 year old above-mentioned history of HER2 positive breast cancer was currently on Kadcyla. She presents to the clinic for a follow-up and to discuss antiestrogen therapy. She complains of left breast tenderness. It hurts when she hugs and sleep on that side. She said she has nose bleed from the Kadcyla. She had 2 that was long lasting. Always on the 5th day. She does has some fatigue and denies nausea. Overall she is tolerating treatment.  ALLERGIES:  is allergic to aleve [naproxen sodium], aspirin, macrobid [nitrofurantoin], motrin [ibuprofen], and tylenol [acetaminophen].  MEDICATIONS:  Current Outpatient Medications  Medication Sig Dispense Refill   anastrozole (ARIMIDEX) 1 MG tablet Take 1 tablet (1 mg total) by mouth daily. 90 tablet 3   benzonatate (TESSALON) 100 MG capsule Take 1 capsule (100 mg total) by mouth 3 (three) times daily as needed. 30 capsule 0   Brimonidine Tartrate (LUMIFY) 0.025 % SOLN Place 1 drop into both eyes daily  as needed (red eyes).     carboxymethylcellulose (REFRESH PLUS) 0.5 % SOLN Place 1 drop into both eyes 3 (three) times daily as needed (dry eyes).     cetirizine (ZYRTEC) 10 MG tablet Take 10 mg by mouth daily.     lidocaine (XYLOCAINE) 2 % solution Swish and swallow 5mL every 4 hours as needed for sore  throat 100 mL 0   Lidocaine-Prilocaine &Lido HCl 2.5-2.5 & 3.88 % KIT APPLY TO AFFECTED AREA ONCE AS DIRECTED     magic mouthwash w/lidocaine SOLN Take 5 mLs by mouth 4 (four) times daily as needed for mouth pain. 240 mL 1   Multiple Vitamins-Iron (CHLORELLA) CAPS Take 1 capsule by mouth daily.     ondansetron (ZOFRAN) 8 MG tablet Take 1 tablet (8 mg total) by mouth every 8 (eight) hours as needed for nausea or vomiting. 30 tablet 1   OVER THE COUNTER MEDICATION Take 1 capsule by mouth daily. Amla     OVER THE COUNTER MEDICATION Take 1 capsule by mouth daily. Mushroom Complex     OVER THE COUNTER MEDICATION Place 1 Application vaginally daily as needed (moisture). Femininity     QUERCETIN PO Take 1 capsule by mouth daily.     sodium chloride (OCEAN) 0.65 % SOLN nasal spray Place 1 spray into both nostrils as needed for congestion.     TURMERIC CURCUMIN PO Take 1 capsule by mouth daily.     No current facility-administered medications for this visit.    PHYSICAL EXAMINATION: ECOG PERFORMANCE STATUS: 1 - Symptomatic but completely ambulatory  Vitals:   02/16/23 0918  BP: 139/73  Pulse: 65  Resp: 18  Temp: 97.7 F (36.5 C)  SpO2: 100%   Filed Weights   02/16/23 0918  Weight: 119 lb 6.4 oz (54.2 kg)    BREAST: Left breast swelling and tenderness to deep palpation.  No palpable lumps or nodules of concern  LABORATORY DATA:  I have reviewed the data as listed    Latest Ref Rng & Units 01/26/2023    9:42 AM 12/15/2022   12:09 PM 11/24/2022    7:47 AM  CMP  Glucose 70 - 99 mg/dL 95  92  86   BUN 6 - 20 mg/dL 10  12  6    Creatinine 0.44 - 1.00 mg/dL 1.610.59  0.960.53  0.450.45   Sodium 135 - 145 mmol/L 136  138  133   Potassium 3.5 - 5.1 mmol/L 4.2  3.9  3.8   Chloride 98 - 111 mmol/L 103  104  100   CO2 22 - 32 mmol/L 28  29  28    Calcium 8.9 - 10.3 mg/dL 9.4  9.6  9.2   Total Protein 6.5 - 8.1 g/dL 6.9  6.6  6.7   Total Bilirubin 0.3 - 1.2 mg/dL 0.5  0.4  0.3   Alkaline Phos 38 - 126  U/L 58  67  58   AST 15 - 41 U/L 30  38  18   ALT 0 - 44 U/L 25  42  20     Lab Results  Component Value Date   WBC 3.1 (L) 02/16/2023   HGB 13.1 02/16/2023   HCT 37.5 02/16/2023   MCV 83.0 02/16/2023   PLT 172 02/16/2023   NEUTROABS 1.7 02/16/2023    ASSESSMENT & PLAN:  Malignant neoplasm of upper-outer quadrant of left breast in female, estrogen receptor positive (HCC) 03/14/2022:Screening detected left breast masses 1.5 cm and 1.3 cm with  enlarged left axillary lymph node, biopsy revealed grade 3 IDC with lymph node being positive, ER 95%, PR 0%, HER2 2+ by IHC and FISH positive with a ratio 2.45 and a copy #4.9, the second biopsy was HER2 negative with a ratio 2.15 and a copy #3.55   Treatment plan based on multidisciplinary tumor board: 1. Neoadjuvant chemotherapy with Herceptin Perjeta and letrozole for 8 cycles.  (Our original recommendation is TCHP x6 cycles but patient is not willing to go through chemo) 2. left lumpectomy and ALND 09/20/2022: 0.6 cm IDC 2/14 lymph nodes, margins negative, ER 95%, PR 0%, HER2 positive 3. Followed by adjuvant radiation therapy started 11/02/2022 (stopped after 20 fractions by choice) 4.  Adjuvant antiestrogen therapy with neratinib ------------------------------------------------------------------------------------------ Current treatment: Kadcyla maintenance cycle 6 Kadcyla toxicities: Fatigue Leukopenia: Monitoring closely   I discussed the role of antiestrogen therapy with anastrozole and discussed the risks and benefits of the treatment.  I sent a prescription for anastrozole today.  Left breast tenderness: There is swelling of the breast and I recommended that she see physical therapy for this.    Return to clinic in 3 weeks for cycle 7    Orders Placed This Encounter  Procedures   DG Bone Density    Standing Status:   Future    Standing Expiration Date:   02/16/2024    Order Specific Question:   Reason for Exam (SYMPTOM  OR  DIAGNOSIS REQUIRED)    Answer:   postmenopausal    Order Specific Question:   Is patient pregnant?    Answer:   No    Order Specific Question:   Preferred imaging location?    Answer:   [facility]   Ambulatory referral to Physical Therapy    Referral Priority:   Routine    Referral Type:   Physical Medicine    Referral Reason:   Specialty Services Required    Requested Specialty:   Physical Therapy    Number of Visits Requested:   1   The patient has a good understanding of the overall plan. she agrees with it. she will call with any problems that may develop before the next visit here. Total time spent: 30 mins including face to face time and time spent for planning, charting and co-ordination of care   Tamsen Meek, MD 02/16/23    I Janan Ridge am acting as a Neurosurgeon for [employer]  I have reviewed the above documentation for accuracy and completeness, and I agree with the above.

## 2023-02-13 ENCOUNTER — Other Ambulatory Visit: Payer: Self-pay

## 2023-02-13 NOTE — Progress Notes (Signed)
Complete physical exam   Patient: Caitlyn Torres   DOB: [birth year]   62 y.o. Female  MRN: [id number] Visit Date: 02/14/2023    Chief Complaint  Patient presents with   Annual Exam   Subjective    Caitlyn Torres is a 61 y.o. female who presents today for a complete physical exam.  She reports consuming a  generally healthy  diet.  Has started walking again. Will jump on her trampoline sometimes.   She generally feels well. She does not have additional problems to discuss today.   HPI  Annual physical  -currently under treatment for breast cancer -taking immunotherapy with small amount of chemotherapy  -did take 20 out of 33 radiation treatments.  -due to have pap smear. She does see GYN - [facility].  -She denies chest pain, chest pressure, or shortness of breath. She denies headaches or visual disturbances. she denies abdominal pain, nausea, vomiting, or changes in bowel or bladder habits.      Past Medical History:  Diagnosis Date   Anxiety    Arthritis    Cancer (HCC) 03/2022   L breast   Depression    Family history of breast cancer 05/15/2022   Family history of pancreatic cancer 05/15/2022   Family history of prostate cancer 05/15/2022   GERD (gastroesophageal reflux disease)    History of hiatal hernia    patient believes she may have been told she has this years ago   Past Surgical History:  Procedure Laterality Date   BREAST BIOPSY  09/19/2022   MM LT RADIOACTIVE SEED EA ADD LESION LOC MAMMO GUIDE 09/19/2022 [facility]   BREAST BIOPSY  09/19/2022   MM LT RADIOACTIVE SEED LOC MAMMO GUIDE 09/19/2022 [facility]   BREAST LUMPECTOMY WITH RADIOACTIVE SEED AND AXILLARY LYMPH NODE DISSECTION Left 09/20/2022   Procedure: LEFT BREAST SEED LUMPECTOMY, LEFT AXILLARY LYMPH NODE DISSECTION;  Surgeon: Harriette Bouillon, MD;  Location: [facility] OR;  Service: General;  Laterality: Left;  GEN & PEC BLOCK   CERVIX SURGERY     Laser removal of pre-cancer cells    LASIK Bilateral    PORTACATH PLACEMENT Right 04/20/2022   Procedure: PORT PLACEMENT;  Surgeon: Harriette Bouillon, MD;  Location: [facility] OR;  Service: General;  Laterality: Right;   Social History   Socioeconomic History   Marital status: Significant Other    Spouse name: [name]   Number of children: 1   Years of education: Not on file   Highest education level: Not on file  Occupational History   Not on file  Tobacco Use   Smoking status: Former    Packs/day: 1.00    Years: 20.00    Additional pack years: 0.00    Total pack years: 20.00    Types: Cigarettes   Smokeless tobacco: Never  Vaping Use   Vaping Use: Never used  Substance and Sexual Activity   Alcohol use: Not Currently   Drug use: Never   Sexual activity: Not on file  Other Topics Concern   Not on file  Social History Narrative   Not on file   Social Determinants of Health   Financial Resource Strain: Low Risk  (04/04/2022)   Overall Financial Resource Strain (CARDIA)    Difficulty of Paying Living Expenses: Not very hard  Food Insecurity: No Food Insecurity (04/04/2022)   Hunger Vital Sign    Worried About Running Out of Food in the Last Year: Never true    Ran Out of Food  in the Last Year: Never true  Transportation Needs: No Transportation Needs (04/04/2022)   PRAPARE - Administrator, Civil Service (Medical): No    Lack of Transportation (Non-Medical): No  Physical Activity: Not on file  Stress: Not on file  Social Connections: Not on file  Intimate Partner Violence: Not At Risk (04/11/2022)   Humiliation, Afraid, Rape, and Kick questionnaire    Fear of Current or Ex-Partner: No    Emotionally Abused: No    Physically Abused: No    Sexually Abused: No   Family Status  Relation Name Status   Father  (Not Specified)   Sister  (Not Specified)   MGF  (Not Specified)   PGM  (Not Specified)   PGF  (Not Specified)   Cousin  (Not Specified)   Family History  Problem Relation Age of Onset    Prostate cancer Father        dx after 70   Breast cancer Sister 29       neg GT   Prostate cancer Maternal Grandfather        d. late 80s   Pancreatic cancer Paternal Grandmother        d. 56s   Parkinson's disease Paternal Grandmother    Bladder Cancer Paternal Grandfather        dx after 8   Breast cancer Cousin        paternal female cousin; dx 68s   Allergies  Allergen Reactions   Aleve [Naproxen Sodium] Hives and Swelling    Swelling of the face   Aspirin Hives and Swelling   Macrobid [Nitrofurantoin]     High Fever   Motrin [Ibuprofen] Hives and Swelling    Swelling of the face   Tylenol [Acetaminophen] Hives and Swelling    Swelling of face     Patient Care Team: Carlean Jews, NP as PCP - General (Family Medicine) Pershing Proud, RN as Oncology Nurse Navigator Donnelly Angelica, RN as Oncology Nurse Navigator [name], MD as Consulting Physician (Hematology and Oncology)   Medications: Outpatient Medications Prior to Visit  Medication Sig   benzonatate (TESSALON) 100 MG capsule Take 1 capsule (100 mg total) by mouth 3 (three) times daily as needed.   Brimonidine Tartrate (LUMIFY) 0.025 % SOLN Place 1 drop into both eyes daily as needed (red eyes).   carboxymethylcellulose (REFRESH PLUS) 0.5 % SOLN Place 1 drop into both eyes 3 (three) times daily as needed (dry eyes).   cetirizine (ZYRTEC) 10 MG tablet Take 10 mg by mouth daily.   lidocaine (XYLOCAINE) 2 % solution Swish and swallow 5mL every 4 hours as needed for sore throat   Lidocaine-Prilocaine &Lido HCl 2.5-2.5 & 3.88 % KIT APPLY TO AFFECTED AREA ONCE AS DIRECTED   magic mouthwash w/lidocaine SOLN Take 5 mLs by mouth 4 (four) times daily as needed for mouth pain.   Multiple Vitamins-Iron (CHLORELLA) CAPS Take 1 capsule by mouth daily.   ondansetron (ZOFRAN) 8 MG tablet Take 1 tablet (8 mg total) by mouth every 8 (eight) hours as needed for nausea or vomiting.   OVER THE COUNTER MEDICATION Take 1  capsule by mouth daily. Amla   OVER THE COUNTER MEDICATION Take 1 capsule by mouth daily. Mushroom Complex   OVER THE COUNTER MEDICATION Place 1 Application vaginally daily as needed (moisture). Femininity   QUERCETIN PO Take 1 capsule by mouth daily.   sodium chloride (OCEAN) 0.65 % SOLN nasal spray Place 1 spray  into both nostrils as needed for congestion.   TURMERIC CURCUMIN PO Take 1 capsule by mouth daily.   [DISCONTINUED] fluticasone (FLONASE) 50 MCG/ACT nasal spray Place 2 sprays into both nostrils daily.   No facility-administered medications prior to visit.    Review of Systems See HPI    Last CBC Lab Results  Component Value Date   WBC 2.9 (L) 03/09/2023   HGB 12.9 03/09/2023   HCT 38.4 03/09/2023   MCV 83.5 03/09/2023   MCH 28.0 03/09/2023   RDW 15.0 03/09/2023   PLT 198 03/09/2023   Last metabolic panel Lab Results  Component Value Date   GLUCOSE 86 03/09/2023   NA 136 03/09/2023   K 3.7 03/09/2023   CL 102 03/09/2023   CO2 29 03/09/2023   BUN 6 03/09/2023   CREATININE 0.56 03/09/2023   GFRNONAA >60 03/09/2023   CALCIUM 9.8 03/09/2023   PROT 6.9 03/09/2023   ALBUMIN 4.2 03/09/2023   BILITOT 0.4 03/09/2023   ALKPHOS 61 03/09/2023   AST 29 03/09/2023   ALT 21 03/09/2023   ANIONGAP 5 03/09/2023        Objective     Today's Vitals   02/14/23 0841  BP: 104/68  Pulse: 65  SpO2: 98%  Weight: 118 lb 6.4 oz (53.7 kg)  Height: 5\' 3"  (1.6 m)   Body mass index is 20.97 kg/m.  BP Readings from Last 3 Encounters:  03/09/23 (Abnormal) 142/85  03/09/23 135/68  02/16/23 121/64    Wt Readings from Last 3 Encounters:  03/09/23 119 lb 6.4 oz (54.2 kg)  02/16/23 119 lb 6.4 oz (54.2 kg)  02/14/23 118 lb 6.4 oz (53.7 kg)     Physical Exam Vitals and nursing note reviewed.  Constitutional:      Appearance: Normal appearance. She is well-developed.  HENT:     Head: Normocephalic and atraumatic.     Right Ear: Tympanic membrane, ear canal and external  ear normal.     Left Ear: Tympanic membrane, ear canal and external ear normal.     Nose: Nose normal.     Mouth/Throat:     Mouth: Mucous membranes are moist.     Pharynx: Oropharynx is clear.  Eyes:     Extraocular Movements: Extraocular movements intact.     Conjunctiva/sclera: Conjunctivae normal.     Pupils: Pupils are equal, round, and reactive to light.  Cardiovascular:     Rate and Rhythm: Normal rate and regular rhythm.     Pulses: Normal pulses.     Heart sounds: Normal heart sounds.  Pulmonary:     Effort: Pulmonary effort is normal.     Breath sounds: Normal breath sounds.  Abdominal:     General: Bowel sounds are normal. There is no distension.     Palpations: Abdomen is soft. There is no mass.     Tenderness: There is no abdominal tenderness. There is no right CVA tenderness, left CVA tenderness, guarding or rebound.     Hernia: No hernia is present.  Musculoskeletal:        General: Normal range of motion.     Cervical back: Normal range of motion and neck supple.  Lymphadenopathy:     Cervical: No cervical adenopathy.  Skin:    General: Skin is warm and dry.     Capillary Refill: Capillary refill takes less than 2 seconds.  Neurological:     General: No focal deficit present.     Mental Status: She is alert and  oriented to person, place, and time.  Psychiatric:        Mood and Affect: Mood normal.        Behavior: Behavior normal.        Thought Content: Thought content normal.        Judgment: Judgment normal.     Last depression screening scores   Row Labels 02/14/2023    8:43 AM 08/14/2022    3:52 PM  PHQ 2/9 Scores   Section Header. No data exists in this row.    PHQ - 2 Score   2 2  PHQ- 9 Score   4 6     Assessment & Plan    Encounter for general adult medical examination with abnormal findings  Malignant neoplasm of upper-outer quadrant of left breast in female, estrogen receptor positive (HCC) Assessment & Plan: Patient currently doing  immunotherapy and mild chemotherapy. Sees oncologist routinely.    Other fatigue Assessment & Plan: Likely from immunotherapy. Labs historically stable. Will continued to be monitored.         There is no immunization history on file for this patient.  Health Maintenance  Topic Date Due   COVID-19 Vaccine (1) Never done   HIV Screening  Never done   Hepatitis C Screening  Never done   DTaP/Tdap/Td (1 - Tdap) Never done   Lung Cancer Screening  04/15/2023   Zoster Vaccines- Shingrix (1 of 2) 05/16/2023 (Originally 08/21/1980)   INFLUENZA VACCINE  06/14/2023   MAMMOGRAM  09/17/2024   PAP SMEAR-Modifier  02/01/2025   Fecal DNA (Cologuard)  02/17/2025   HPV VACCINES  Aged Out    Discussed health benefits of physical activity, and encouraged her to engage in regular exercise appropriate for her age and condition.    Return in about 1 year (around 02/14/2024) for health maintenance exam.        Carlean Jews, NP  [facility] [phone] (phone) [phone] (fax)  [facility]

## 2023-02-13 NOTE — Progress Notes (Deleted)
Established patient visit   Patient: Caitlyn Torres   DOB: September 16, 1961   63 y.o. Female  MRN: [id number] Visit Date: 02/14/2023   No chief complaint on file.  Subjective    HPI  ***   Medications: Outpatient Medications Prior to Visit  Medication Sig   benzonatate (TESSALON) 100 MG capsule Take 1 capsule (100 mg total) by mouth 3 (three) times daily as needed.   Brimonidine Tartrate (LUMIFY) 0.025 % SOLN Place 1 drop into both eyes daily as needed (red eyes).   carboxymethylcellulose (REFRESH PLUS) 0.5 % SOLN Place 1 drop into both eyes 3 (three) times daily as needed (dry eyes).   cetirizine (ZYRTEC) 10 MG tablet Take 10 mg by mouth daily.   fluticasone (FLONASE) 50 MCG/ACT nasal spray Place 2 sprays into both nostrils daily.   lidocaine (XYLOCAINE) 2 % solution Swish and swallow 82mL every 4 hours as needed for sore throat   Lidocaine-Prilocaine &Lido HCl 2.5-2.5 & 3.88 % KIT APPLY TO AFFECTED AREA ONCE AS DIRECTED   magic mouthwash w/lidocaine SOLN Take 5 mLs by mouth 4 (four) times daily as needed for mouth pain.   Multiple Vitamins-Iron (CHLORELLA) CAPS Take 1 capsule by mouth daily.   ondansetron (ZOFRAN) 8 MG tablet Take 1 tablet (8 mg total) by mouth every 8 (eight) hours as needed for nausea or vomiting.   OVER THE COUNTER MEDICATION Take 1 capsule by mouth daily. Amla   OVER THE COUNTER MEDICATION Take 1 capsule by mouth daily. Mushroom Complex   OVER THE COUNTER MEDICATION Place 1 Application vaginally daily as needed (moisture). Femininity   QUERCETIN PO Take 1 capsule by mouth daily.   sodium chloride (OCEAN) 0.65 % SOLN nasal spray Place 1 spray into both nostrils as needed for congestion.   TURMERIC CURCUMIN PO Take 1 capsule by mouth daily.   No facility-administered medications prior to visit.    Review of Systems  {Labs (Optional):23779}   Objective    There were no vitals filed for this visit. There is no height or weight on file to calculate  BMI.  BP Readings from Last 3 Encounters:  01/26/23 127/60  12/15/22 132/70  12/15/22 132/61    Wt Readings from Last 3 Encounters:  01/26/23 118 lb 12 oz (53.9 kg)  12/15/22 117 lb (53.1 kg)  11/24/22 120 lb 1 oz (54.5 kg)    Physical Exam  ***  No results found for any visits on 02/14/23.  Assessment & Plan    There are no diagnoses linked to this encounter.   No follow-ups on file.         Ronnell Freshwater, NP  [facility] [phone] (phone) [phone] (fax)  [place]

## 2023-02-14 ENCOUNTER — Ambulatory Visit: Payer: BC Managed Care – PPO | Admitting: Nurse Practitioner

## 2023-02-14 ENCOUNTER — Encounter: Payer: Self-pay | Admitting: Nurse Practitioner

## 2023-02-14 VITALS — BP 104/68 | HR 65 | Ht 63.0 in | Wt 118.4 lb

## 2023-02-14 DIAGNOSIS — R5383 Other fatigue: Secondary | ICD-10-CM

## 2023-02-14 DIAGNOSIS — Z0001 Encounter for general adult medical examination with abnormal findings: Secondary | ICD-10-CM

## 2023-02-14 DIAGNOSIS — Z170 Estrogen receptor positive status [ER+]: Secondary | ICD-10-CM | POA: Diagnosis not present

## 2023-02-14 DIAGNOSIS — C50412 Malignant neoplasm of upper-outer quadrant of left female breast: Secondary | ICD-10-CM

## 2023-02-14 NOTE — Assessment & Plan Note (Signed)
Likely from immunotherapy. Labs historically stable. Will continued to be monitored.

## 2023-02-14 NOTE — Assessment & Plan Note (Signed)
Patient currently doing immunotherapy and mild chemotherapy. Sees oncologist routinely.

## 2023-02-15 ENCOUNTER — Other Ambulatory Visit: Payer: Self-pay

## 2023-02-15 ENCOUNTER — Encounter: Payer: Self-pay | Admitting: *Deleted

## 2023-02-15 ENCOUNTER — Encounter: Payer: Self-pay | Admitting: Hematology and Oncology

## 2023-02-16 ENCOUNTER — Inpatient Hospital Stay: Payer: BC Managed Care – PPO | Attending: Hematology and Oncology

## 2023-02-16 ENCOUNTER — Inpatient Hospital Stay (HOSPITAL_BASED_OUTPATIENT_CLINIC_OR_DEPARTMENT_OTHER): Payer: BC Managed Care – PPO | Admitting: Hematology and Oncology

## 2023-02-16 ENCOUNTER — Inpatient Hospital Stay: Payer: BC Managed Care – PPO

## 2023-02-16 VITALS — BP 139/73 | HR 65 | Temp 97.7°F | Resp 18 | Ht 63.0 in | Wt 119.4 lb

## 2023-02-16 VITALS — BP 121/64 | HR 70 | Resp 18

## 2023-02-16 DIAGNOSIS — R5383 Other fatigue: Secondary | ICD-10-CM | POA: Diagnosis not present

## 2023-02-16 DIAGNOSIS — Z8041 Family history of malignant neoplasm of ovary: Secondary | ICD-10-CM | POA: Insufficient documentation

## 2023-02-16 DIAGNOSIS — Z95828 Presence of other vascular implants and grafts: Secondary | ICD-10-CM

## 2023-02-16 DIAGNOSIS — Z170 Estrogen receptor positive status [ER+]: Secondary | ICD-10-CM | POA: Diagnosis not present

## 2023-02-16 DIAGNOSIS — C50412 Malignant neoplasm of upper-outer quadrant of left female breast: Secondary | ICD-10-CM | POA: Insufficient documentation

## 2023-02-16 DIAGNOSIS — Z79811 Long term (current) use of aromatase inhibitors: Secondary | ICD-10-CM | POA: Diagnosis not present

## 2023-02-16 DIAGNOSIS — Z780 Asymptomatic menopausal state: Secondary | ICD-10-CM | POA: Diagnosis not present

## 2023-02-16 DIAGNOSIS — Z803 Family history of malignant neoplasm of breast: Secondary | ICD-10-CM | POA: Diagnosis not present

## 2023-02-16 DIAGNOSIS — Z800 Family history of malignant neoplasm of digestive organs: Secondary | ICD-10-CM | POA: Diagnosis not present

## 2023-02-16 DIAGNOSIS — D72819 Decreased white blood cell count, unspecified: Secondary | ICD-10-CM | POA: Insufficient documentation

## 2023-02-16 DIAGNOSIS — K219 Gastro-esophageal reflux disease without esophagitis: Secondary | ICD-10-CM | POA: Diagnosis not present

## 2023-02-16 DIAGNOSIS — Z87891 Personal history of nicotine dependence: Secondary | ICD-10-CM | POA: Diagnosis not present

## 2023-02-16 DIAGNOSIS — Z9221 Personal history of antineoplastic chemotherapy: Secondary | ICD-10-CM | POA: Insufficient documentation

## 2023-02-16 DIAGNOSIS — Z5112 Encounter for antineoplastic immunotherapy: Secondary | ICD-10-CM | POA: Insufficient documentation

## 2023-02-16 LAB — CMP (CANCER CENTER ONLY)
ALT: 49 U/L — ABNORMAL HIGH (ref 0–44)
AST: 49 U/L — ABNORMAL HIGH (ref 15–41)
Albumin: 4.1 g/dL (ref 3.5–5.0)
Alkaline Phosphatase: 56 U/L (ref 38–126)
Anion gap: 6 (ref 5–15)
BUN: 8 mg/dL (ref 6–20)
CO2: 26 mmol/L (ref 22–32)
Calcium: 9.2 mg/dL (ref 8.9–10.3)
Chloride: 102 mmol/L (ref 98–111)
Creatinine: 0.52 mg/dL (ref 0.44–1.00)
GFR, Estimated: >60 mL/min (ref >60)
Glucose, Bld: 95 mg/dL (ref 70–99)
Potassium: 3.6 mmol/L (ref 3.5–5.1)
Sodium: 134 mmol/L — ABNORMAL LOW (ref 135–145)
Total Bilirubin: 0.7 mg/dL (ref 0.3–1.2)
Total Protein: 6.9 g/dL (ref 6.5–8.1)

## 2023-02-16 LAB — CBC WITH DIFFERENTIAL (CANCER CENTER ONLY)
Abs Immature Granulocytes: 0.01 10*3/uL (ref 0.00–0.07)
Basophils Absolute: 0.1 10*3/uL (ref 0.0–0.1)
Basophils Relative: 2 %
Eosinophils Absolute: 0.1 10*3/uL (ref 0.0–0.5)
Eosinophils Relative: 2 %
HCT: 37.5 % (ref 36.0–46.0)
Hemoglobin: 13.1 g/dL (ref 12.0–15.0)
Immature Granulocytes: 0 %
Lymphocytes Relative: 27 %
Lymphs Abs: 0.8 10*3/uL (ref 0.7–4.0)
MCH: 29 pg (ref 26.0–34.0)
MCHC: 34.9 g/dL (ref 30.0–36.0)
MCV: 83 fL (ref 80.0–100.0)
Monocytes Absolute: 0.4 10*3/uL (ref 0.1–1.0)
Monocytes Relative: 14 %
Neutro Abs: 1.7 10*3/uL (ref 1.7–7.7)
Neutrophils Relative %: 55 %
Platelet Count: 172 10*3/uL (ref 150–400)
RBC: 4.52 MIL/uL (ref 3.87–5.11)
RDW: 15.4 % (ref 11.5–15.5)
WBC Count: 3.1 10*3/uL — ABNORMAL LOW (ref 4.0–10.5)
nRBC: 0 % (ref 0.0–0.2)

## 2023-02-16 MED ORDER — HEPARIN SOD (PORK) LOCK FLUSH 100 UNIT/ML IV SOLN
500.0000 [IU] | Freq: Once | INTRAVENOUS | Status: AC | PRN
  Administered 2023-02-16: 500 [IU]

## 2023-02-16 MED ORDER — SODIUM CHLORIDE 0.9 % IV SOLN
3.6000 mg/kg | Freq: Once | INTRAVENOUS | Status: AC
  Administered 2023-02-16: 200 mg via INTRAVENOUS
  Filled 2023-02-16: qty 10

## 2023-02-16 MED ORDER — DIPHENHYDRAMINE HCL 25 MG PO CAPS
25.0000 mg | ORAL_CAPSULE | Freq: Once | ORAL | Status: AC
  Administered 2023-02-16: 25 mg via ORAL
  Filled 2023-02-16: qty 1

## 2023-02-16 MED ORDER — SODIUM CHLORIDE 0.9 % IV SOLN: Freq: Once | INTRAVENOUS | Status: AC

## 2023-02-16 MED ORDER — SODIUM CHLORIDE 0.9% FLUSH
10.0000 mL | INTRAVENOUS | Status: DC | PRN
  Administered 2023-02-16: 10 mL

## 2023-02-16 MED ORDER — ANASTROZOLE 1 MG PO TABS: 1.0000 mg | ORAL_TABLET | Freq: Every day | ORAL | 3 refills | Status: AC

## 2023-02-16 MED ORDER — PROCHLORPERAZINE MALEATE 10 MG PO TABS
10.0000 mg | ORAL_TABLET | Freq: Once | ORAL | Status: AC
  Administered 2023-02-16: 10 mg via ORAL
  Filled 2023-02-16: qty 1

## 2023-02-16 MED ORDER — SODIUM CHLORIDE 0.9% FLUSH
10.0000 mL | Freq: Once | INTRAVENOUS | Status: AC
  Administered 2023-02-16: 10 mL

## 2023-02-16 NOTE — Patient Instructions (Signed)
[facility]  Discharge Instructions: Thank you for choosing [facility] to provide your oncology and hematology care.   If you have a lab appointment with the Cancer Center, please go directly to the Cancer Center and check in at the registration area.   Wear comfortable clothing and clothing appropriate for easy access to any Portacath or PICC line.   We strive to give you quality time with your provider. You may need to reschedule your appointment if you arrive late (15 or more minutes).  Arriving late affects you and other patients whose appointments are after yours.  Also, if you miss three or more appointments without notifying the office, you may be dismissed from the clinic at the provider's discretion.      For prescription refill requests, have your pharmacy contact our office and allow 72 hours for refills to be completed.    Today you received the following chemotherapy and/or immunotherapy agents: Kadcyla.       To help prevent nausea and vomiting after your treatment, we encourage you to take your nausea medication as directed.  BELOW ARE SYMPTOMS THAT SHOULD BE REPORTED IMMEDIATELY: *FEVER GREATER THAN 100.4 F (38 C) OR HIGHER *CHILLS OR SWEATING *NAUSEA AND VOMITING THAT IS NOT CONTROLLED WITH YOUR NAUSEA MEDICATION *UNUSUAL SHORTNESS OF BREATH *UNUSUAL BRUISING OR BLEEDING *URINARY PROBLEMS (pain or burning when urinating, or frequent urination) *BOWEL PROBLEMS (unusual diarrhea, constipation, pain near the anus) TENDERNESS IN MOUTH AND THROAT WITH OR WITHOUT PRESENCE OF ULCERS (sore throat, sores in mouth, or a toothache) UNUSUAL RASH, SWELLING OR PAIN  UNUSUAL VAGINAL DISCHARGE OR ITCHING   Items with * indicate a potential emergency and should be followed up as soon as possible or go to the Emergency Department if any problems should occur.  Please show the CHEMOTHERAPY ALERT CARD or IMMUNOTHERAPY ALERT CARD at  check-in to the Emergency Department and triage nurse.  Should you have questions after your visit or need to cancel or reschedule your appointment, please contact [facility]  Dept: [phone]  and follow the prompts.  Office hours are 8:00 a.m. to 4:30 p.m. Monday - Friday. Please note that voicemails left after 4:00 p.m. may not be returned until the following business day.  We are closed weekends and major holidays. You have access to a nurse at all times for urgent questions. Please call the main number to the clinic Dept: [phone] and follow the prompts.   For any non-urgent questions, you may also contact your provider using MyChart. We now offer e-Visits for anyone 18 and older to request care online for non-urgent symptoms. For details visit [email or web address].   Also download the MyChart app! Go to the app store, search "MyChart", open the app, select , and log in with your MyChart username and password.   

## 2023-02-16 NOTE — Assessment & Plan Note (Addendum)
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 Neoadjuvant chemotherapy with Herceptin Perjeta and letrozole for 8 cycles.  (Our original recommendation is TCHP x6 cycles but patient is not willing to go through chemo) 2. left lumpectomy and ALND 09/20/2022: 0.6 cm IDC 2/14 lymph nodes, margins negative, ER 95%, PR 0%, HER2 positive 3. Followed by adjuvant radiation therapy started 11/02/2022 (stopped after 20 fractions by choice) 4.  Adjuvant antiestrogen therapy with neratinib ------------------------------------------------------------------------------------------ Current treatment: Kadcyla maintenance cycle 6 Kadcyla toxicities: Fatigue Leukopenia: Monitoring closely   I discussed the role of antiestrogen therapy with anastrozole and discussed the risks and benefits of the treatment.  I sent a prescription for anastrozole today.  Left breast tenderness: There is swelling of the breast and I recommended that she see physical therapy for this.    Return to clinic in 3 weeks for cycle 7

## 2023-02-20 NOTE — Therapy (Signed)
OUTPATIENT PHYSICAL THERAPY  UPPER EXTREMITY ONCOLOGY EVALUATION  Patient Name: Caitlyn Torres MRN: [id number] DOB:[birth year], 62 y.o., female Today's Date: 02/20/2023  END OF SESSION:   Past Medical History:  Diagnosis Date   Anxiety    Arthritis    Cancer 03/2022   L breast   Depression    Family history of breast cancer 05/15/2022   Family history of pancreatic cancer 05/15/2022   Family history of prostate cancer 05/15/2022   GERD (gastroesophageal reflux disease)    History of hiatal hernia    patient believes she may have been told she has this years ago   Past Surgical History:  Procedure Laterality Date   BREAST BIOPSY  09/19/2022   MM LT RADIOACTIVE SEED EA ADD LESION LOC MAMMO GUIDE 09/19/2022 [facility]   BREAST BIOPSY  09/19/2022   MM LT RADIOACTIVE SEED LOC MAMMO GUIDE 09/19/2022 [facility]   BREAST LUMPECTOMY WITH RADIOACTIVE SEED AND AXILLARY LYMPH NODE DISSECTION Left 09/20/2022   Procedure: LEFT BREAST SEED LUMPECTOMY, LEFT AXILLARY LYMPH NODE DISSECTION;  Surgeon: Harriette Bouillon, MD;  Location: [facility] OR;  Service: General;  Laterality: Left;  GEN & PEC BLOCK   CERVIX SURGERY     Laser removal of pre-cancer cells   LASIK Bilateral    PORTACATH PLACEMENT Right 04/20/2022   Procedure: PORT PLACEMENT;  Surgeon: Harriette Bouillon, MD;  Location: [facility] OR;  Service: General;  Laterality: Right;   Patient Active Problem List   Diagnosis Date Noted   Other fatigue 08/28/2022   Acute pain 08/28/2022   Genetic testing 06/01/2022   Family history of breast cancer 05/15/2022   Family history of pancreatic cancer 05/15/2022   Family history of prostate cancer 05/15/2022   Port-A-Cath in place 04/28/2022   Malignant neoplasm of upper-outer quadrant of left breast in female, estrogen receptor positive 03/29/2022    PCP: ***  REFERRING PROVIDER: [name], MD  REFERRING DIAG: Left breast swelling/tenderness  THERAPY DIAG:  No diagnosis  found.  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 Neoadjuvant chemotherapy with Herceptin Perjeta and letrozole for 8 cycles.  (Our original recommendation is TCHP x6 cycles but patient is not willing to go through chemo) 2. left lumpectomy and ALND 09/20/2022: 0.6 cm IDC 2/14 lymph nodes, margins negative, ER 95%, PR 0%, HER2 positive 3. Followed by adjuvant radiation therapy  4.  Adjuvant antiestrogen therapy with neratinib She had left lumpectomy on 09/20/2022 with 2/14 LN's    PAIN:  Are you having pain? {yes/no:20286} NPRS scale: ***/10 Pain location: *** Pain orientation: {Pain Orientation:25161}  PAIN TYPE: {type:313116} Pain description: {PAIN DESCRIPTION:21022940}  Aggravating factors: *** Relieving factors: ***  PRECAUTIONS: Left UE lymphedema precautions  WEIGHT BEARING RESTRICTIONS: No  FALLS:  Has patient fallen in last 6 months? {fallsyesno:27318}  LIVING ENVIRONMENT: Lives with: lives with their partner Lives in: House/apartment Has following equipment at home: None  OCCUPATION: speech pathologist;works for [employer]  LEISURE: walking, out to eat  HAND DOMINANCE: right   PRIOR LEVEL OF FUNCTION: Independent  PATIENT GOALS: ***   OBJECTIVE:  COGNITION: Overall cognitive status: Within functional limits for tasks assessed   PALPATION: ***  OBSERVATIONS / OTHER  ASSESSMENTS: ***  SENSATION: Light touch: {intact/deficits:24005}   POSTURE: forward head, rounded shoulders  UPPER EXTREMITY AROM/PROM:  A/PROM RIGHT   eval   Shoulder extension   Shoulder flexion   Shoulder abduction   Shoulder internal rotation   Shoulder external rotation     (Blank rows = not tested)  A/PROM LEFT   eval  Shoulder extension   Shoulder flexion   Shoulder abduction   Shoulder internal rotation   Shoulder external rotation     (Blank rows = not tested)  CERVICAL AROM: All within normal limits:     UPPER EXTREMITY STRENGTH:   LYMPHEDEMA ASSESSMENTS:   SURGERY TYPE/DATE: Left lumpectomy,09/21/2023  NUMBER OF LYMPH NODES REMOVED: 2+/14  CHEMOTHERAPY: No at her request, Did Herceptin, Perjeta and letrozole  RADIATION:yes but stopped early at her request  HORMONE TREATMENT: YES  INFECTIONS: NO   LYMPHEDEMA ASSESSMENTS:   LANDMARK RIGHT  eval  At axilla    15 cm proximal to olecranon process   10 cm proximal to olecranon process   Olecranon process   15 cm proximal to ulnar styloid process   10 cm proximal to ulnar styloid process   Just proximal to ulnar styloid process   Across hand at thumb web space   At base of 2nd digit   (Blank rows = not tested)  LANDMARK LEFT  eval  At axilla    15 cm proximal to olecranon process   10 cm proximal to olecranon process   Olecranon process   15 cm proximal to ulnar styloid process   10 cm proximal to ulnar styloid process   Just proximal to ulnar styloid process   Across hand at thumb web space   At base of 2nd digit   (Blank rows = not tested)    L-DEX LYMPHEDEMA SCREENING: The patient was assessed using the L-Dex machine today to produce a lymphedema index baseline score. The patient will be reassessed on a regular basis (typically every 3 months) to obtain new L-Dex scores. If the score is > 6.5 points away from his/her baseline score indicating onset of subclinical lymphedema, it  will be recommended to wear a compression garment for 4 weeks, 12 hours per day and then be reassessed. If the score continues to be > 6.5 points from baseline at reassessment, we will  initiate lymphedema treatment. Assessing in this manner has a 95% rate of preventing clinically significant lymphedema.  QUICK DASH SURVEY: ***   TODAY'S TREATMENT:                                                                                                                                          DATE: ***    PATIENT EDUCATION:  Education details: *** Person educated: {Person educated:25204} Education method: {Education Method:25205} Education comprehension: {Education Comprehension:25206}  HOME EXERCISE PROGRAM: ***  ASSESSMENT:  CLINICAL IMPRESSION: Patient is a 62 y.o. female who was seen today for physical therapy evaluation and treatment for complaints of left breast swelling and tenderness.    OBJECTIVE IMPAIRMENTS: {opptimpairments:25111}.   ACTIVITY LIMITATIONS: {activitylimitations:27494}  PARTICIPATION LIMITATIONS: {participationrestrictions:25113}  PERSONAL FACTORS: {Personal factors:25162} are also affecting patient's functional outcome.   REHAB POTENTIAL: Excellent  CLINICAL DECISION MAKING: Stable/uncomplicated  EVALUATION COMPLEXITY: Low  GOALS: Goals reviewed with patient? Yes  SHORT TERM GOALS= LONG TERM GOALS: Target date: ***  Pt will be independent in MLD for reducing left breast edema Baseline: Goal status: INITIAL  2.  Pt will note decreased breast swelling/tenderness by atleast 50% Baseline:  Goal status: INITIAL  3.  *** Baseline:  Goal status: {GOALSTATUS:25110}  4.  *** Baseline:  Goal status: {GOALSTATUS:25110}  5.  *** Baseline:  Goal status: {GOALSTATUS:25110}  6.  *** Baseline:  Goal status: {GOALSTATUS:25110}  PLAN:  PT FREQUENCY: {rehab frequency:25116}  PT DURATION: {rehab duration:25117}  PLANNED INTERVENTIONS: {rehab  planned interventions:25118::"Therapeutic exercises","Therapeutic activity","Neuromuscular re-education","Balance training","Gait training","Patient/Family education","Self Care","Joint mobilization"}  PLAN FOR NEXT SESSION: ***  Waynette Buttery, PT 02/20/2023, 9:18 AM

## 2023-02-21 ENCOUNTER — Other Ambulatory Visit: Payer: Self-pay

## 2023-02-21 ENCOUNTER — Ambulatory Visit: Payer: BC Managed Care – PPO | Attending: Hematology and Oncology

## 2023-02-21 DIAGNOSIS — M25612 Stiffness of left shoulder, not elsewhere classified: Secondary | ICD-10-CM | POA: Insufficient documentation

## 2023-02-21 DIAGNOSIS — Z483 Aftercare following surgery for neoplasm: Secondary | ICD-10-CM | POA: Insufficient documentation

## 2023-02-21 DIAGNOSIS — R293 Abnormal posture: Secondary | ICD-10-CM | POA: Diagnosis present

## 2023-02-21 DIAGNOSIS — I890 Lymphedema, not elsewhere classified: Secondary | ICD-10-CM | POA: Diagnosis present

## 2023-02-21 DIAGNOSIS — C50412 Malignant neoplasm of upper-outer quadrant of left female breast: Secondary | ICD-10-CM | POA: Diagnosis present

## 2023-02-21 DIAGNOSIS — Z780 Asymptomatic menopausal state: Secondary | ICD-10-CM | POA: Diagnosis not present

## 2023-02-21 DIAGNOSIS — Z170 Estrogen receptor positive status [ER+]: Secondary | ICD-10-CM | POA: Insufficient documentation

## 2023-02-27 ENCOUNTER — Ambulatory Visit: Payer: BC Managed Care – PPO

## 2023-02-27 DIAGNOSIS — Z483 Aftercare following surgery for neoplasm: Secondary | ICD-10-CM

## 2023-02-27 DIAGNOSIS — R293 Abnormal posture: Secondary | ICD-10-CM

## 2023-02-27 DIAGNOSIS — M25612 Stiffness of left shoulder, not elsewhere classified: Secondary | ICD-10-CM

## 2023-02-27 DIAGNOSIS — I890 Lymphedema, not elsewhere classified: Secondary | ICD-10-CM

## 2023-02-27 DIAGNOSIS — C50412 Malignant neoplasm of upper-outer quadrant of left female breast: Secondary | ICD-10-CM

## 2023-02-27 NOTE — Therapy (Signed)
OUTPATIENT PHYSICAL THERAPY  UPPER EXTREMITY ONCOLOGY EVALUATION  Patient Name: Caitlyn Torres MRN: [id number] DOB:03/10/1961, 62 y.o., female Today's Date: 02/27/2023  END OF SESSION:  PT End of Session - 02/27/23 0806     Visit Number 2    Number of Visits 12    Date for PT Re-Evaluation 04/04/23    PT Start Time 0806    PT Stop Time 0858    PT Time Calculation (min) 52 min    Activity Tolerance Patient tolerated treatment well    Behavior During Therapy [facility] for tasks assessed/performed             Past Medical History:  Diagnosis Date   Anxiety    Arthritis    Cancer 03/2022   L breast   Depression    Family history of breast cancer 05/15/2022   Family history of pancreatic cancer 05/15/2022   Family history of prostate cancer 05/15/2022   GERD (gastroesophageal reflux disease)    History of hiatal hernia    patient believes she may have been told she has this years ago   Past Surgical History:  Procedure Laterality Date   BREAST BIOPSY  09/19/2022   MM LT RADIOACTIVE SEED EA ADD LESION LOC MAMMO GUIDE 09/19/2022 [facility]   BREAST BIOPSY  09/19/2022   MM LT RADIOACTIVE SEED LOC MAMMO GUIDE 09/19/2022 [facility]   BREAST LUMPECTOMY WITH RADIOACTIVE SEED AND AXILLARY LYMPH NODE DISSECTION Left 09/20/2022   Procedure: LEFT BREAST SEED LUMPECTOMY, LEFT AXILLARY LYMPH NODE DISSECTION;  Surgeon: Harriette Bouillon, MD;  Location: [facility] OR;  Service: General;  Laterality: Left;  GEN & PEC BLOCK   CERVIX SURGERY     Laser removal of pre-cancer cells   LASIK Bilateral    PORTACATH PLACEMENT Right 04/20/2022   Procedure: PORT PLACEMENT;  Surgeon: Harriette Bouillon, MD;  Location: [facility] OR;  Service: General;  Laterality: Right;   Patient Active Problem List   Diagnosis Date Noted   Other fatigue 08/28/2022   Acute pain 08/28/2022   Genetic testing 06/01/2022   Family history of breast cancer 05/15/2022   Family history of pancreatic cancer 05/15/2022   Family  history of prostate cancer 05/15/2022   Port-A-Cath in place 04/28/2022   Malignant neoplasm of upper-outer quadrant of left breast in female, estrogen receptor positive 03/29/2022      REFERRING PROVIDER: [name], MD  REFERRING DIAG: Left breast swelling/tenderness  THERAPY DIAG:  Malignant neoplasm of upper-outer quadrant of left female breast, unspecified estrogen receptor status  Stiffness of left shoulder, not elsewhere classified  Lymphedema of breast  Aftercare following surgery for neoplasm  Abnormal posture  ONSET DATE: Early March  Rationale for Evaluation and Treatment: Rehabilitation  SUBJECTIVE:  SUBJECTIVE STATEMENT:  The new medicine Anastrozole is making me so tired. I took a nap at my parents house 1 day for 2 hours. I wore the compression bra all day yesterday. I don't see any changes yet. I have been doing the stretches and it feels better, but gets tight again.  PERTINENT HISTORY:  03/14/2022:Screening detected left breast masses 1.5 cm and 1.3 cm with enlarged left axillary lymph node, biopsy revealed grade 3 IDC with lymph node being positive, ER 95%, PR 0%, HER2 2+ by IHC and FISH positive with a ratio 2.45 and a copy #4.9, the second biopsy was HER2 negative with a ratio  1. Neoadjuvant chemotherapy with Herceptin Perjeta and letrozole for 8 cycles.  (Our original recommendation is TCHP x6 cycles but patient is not willing to go through chemo) 2. left lumpectomy and ALND 09/20/2022: 0.6 cm IDC 2/14 lymph nodes, margins negative, ER 95%, PR 0%, HER2 positive 3. Followed by adjuvant radiation therapy  4.  Adjuvant antiestrogen therapy with neratinib She had left lumpectomy on 09/20/2022 with 2/14 LN's    PAIN:  Are you having pain? Yes NPRS scale:5/10 Pain location:  LEFT Pain orientation: Left  PAIN TYPE: aching, sharp, and tight Pain description: constant  Aggravating factors: being hugged, reaching, lying on left side Relieving factors: Compression?  PRECAUTIONS: Left UE lymphedema precautions  WEIGHT BEARING RESTRICTIONS: No  FALLS:  Has patient fallen in last 6 months? 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  LEISURE: walking, out to eat  HAND DOMINANCE: right   PRIOR LEVEL OF FUNCTION: Independent  PATIENT GOALS: Reduce swelling, pain   OBJECTIVE:  COGNITION: Overall cognitive status: Within functional limits for tasks assessed   PALPATION: Very tender at left upper and lateral breast , UT,especially in area of incisions with fibrosis noted around incisions  OBSERVATIONS / OTHER ASSESSMENTS: enlarged pores noted throughout left breast, cording noted left axilla down to axillary incision  SENSATION: Light touch: Deficits     POSTURE: forward head, rounded shoulders  UPPER EXTREMITY AROM/PROM:  A/PROM RIGHT   eval   Shoulder extension 42  Shoulder flexion 163  Shoulder abduction 179  Shoulder internal rotation 58  Shoulder external rotation 88    (Blank rows = not tested)  A/PROM LEFT   eval  Shoulder extension 37  Shoulder flexion 144  Shoulder abduction 140  Shoulder internal rotation 56  Shoulder external rotation 84    (Blank rows = not tested)  CERVICAL AROM: All within normal limits:     UPPER EXTREMITY STRENGTH:   LYMPHEDEMA ASSESSMENTS:   SURGERY TYPE/DATE: Left lumpectomy,09/20/2022  NUMBER OF LYMPH NODES REMOVED: 2+/14  CHEMOTHERAPY: No at her request, Did Herceptin, Perjeta and letrozole  RADIATION:yes but stopped early at her request  HORMONE TREATMENT: YES  INFECTIONS: NO   LYMPHEDEMA ASSESSMENTS:   LANDMARK RIGHT  eval  At  axilla    15 cm proximal to olecranon process   10 cm proximal to olecranon process   Olecranon process   15 cm proximal to ulnar styloid process   10 cm proximal to ulnar styloid process   Just proximal to ulnar styloid process   Across hand at thumb web space   At base of 2nd digit   (Blank rows = not tested)  LANDMARK LEFT  eval  At axilla    15 cm proximal to olecranon process  10 cm proximal to olecranon process   Olecranon process   15 cm proximal to ulnar styloid process   10 cm proximal to ulnar styloid process   Just proximal to ulnar styloid process   Across hand at thumb web space   At base of 2nd digit   (Blank rows = not tested)    L-DEX LYMPHEDEMA SCREENING: The patient was assessed using the L-Dex machine today to produce a lymphedema index baseline score. The patient will be reassessed on a regular basis (typically every 3 months) to obtain new L-Dex scores. If the score is > 6.5 points away from his/her baseline score indicating onset of subclinical lymphedema, it will be recommended to wear a compression garment for 4 weeks, 12 hours per day and then be reassessed. If the score continues to be > 6.5 points from baseline at reassessment, we will initiate lymphedema treatment. Assessing in this manner has a 95% rate of preventing clinically significant lymphedema.   BREAST COMPLAINTS QUESTIONNAIRE Pain:8 Heaviness:8 Swollen feeling:9 Tense Skin:8 Redness:6 Bra Print:9 Size of Pores:5 Hard feeling: 6 Total:   59  /80 A Score over 9 indicates lymphedema issues in the breast   TODAY'S TREATMENT:                                                                                                                                          DATE:  Pt permission and consent throughout each step of examination and treatment with modification and draping if requested when working on sensitive areas  02/27/2023 STM left UT, pectorals, lateral trunk and in SL to left  scapular area and lats with cocoa butter Supine wand flexion and scaption x 5 PROM left shoulder flexion, scaption, abduction MFR to left axilla LTR x 5 with arms outstretched Scar massage left lateral breast. Doorway pectoral stretch x 3, wall stretch b x 3 Standing lat stretch x 2     PATIENT EDUCATION: 02/20/2022 Education details: Discussed POC, Left UQ tightness and limitations in ROM, breast swelling, compression bra, MLD Person educated: Patient Education method: Explanation Education comprehension: verbalized understanding  HOME EXERCISE PROGRAM: None given; will resume 4 post of exercises and hold on band exercises right now.  ASSESSMENT:  CLINICAL IMPRESSION:  Pt very tight and tender throughout left pectorals and lats. PROM improved after STM and exercises. Pt continues with swelling/pain in left breast. Will address with MLD next visit.   OBJECTIVE IMPAIRMENTS: decreased activity tolerance, decreased knowledge of condition, decreased ROM, increased edema, increased fascial restrictions, impaired UE functional use, postural dysfunction, and pain.   ACTIVITY LIMITATIONS: sleeping and reach over head  PARTICIPATION LIMITATIONS: interpersonal relationship  PERSONAL FACTORS: 3+ comorbidities: Left breast cancer s/p infusions and radiation  are also affecting patient's functional outcome.   REHAB POTENTIAL: Excellent  CLINICAL DECISION MAKING: Stable/uncomplicated  EVALUATION COMPLEXITY: Low  GOALS: Goals reviewed with patient? Yes  SHORT  TERM GOALS= LONG TERM GOALS: Target date: 04/04/2023  Pt will be independent in MLD for reducing left breast edema Baseline: Goal status: INITIAL  2.  Pt will note decreased breast swelling/tenderness by atleast 50% Baseline:  Goal status: INITIAL  3.  Pt will be able to lie on her left side to sleep for short periods of time Baseline:  Goal status: INITIAL  4.  Pt will have left shoulder ROM WNL without increased pain  with reaching Baseline:  Goal status: INITIAL  5.  Breast complaints survey will be reduced to no greater than 9 Baseline:  Goal status: INITIAL PLAN:  PT FREQUENCY: 2x/week  PT DURATION: 6 weeks  PLANNED INTERVENTIONS: Therapeutic exercises, Therapeutic activity, Patient/Family education, Self Care, Joint mobilization, Orthotic/Fit training, Manual lymph drainage, scar mobilization, Vasopneumatic device, Manual therapy, and Re-evaluation  PLAN FOR NEXT SESSION: peach foam for bra, start breast MLD, STM to left UQ, PROM left shoulder, LTR with arms outstretched, wand exs, MLD to left breast and instruct pt., update HEP prn.  Waynette Buttery, PT 02/27/2023, 9:02 AM

## 2023-02-28 ENCOUNTER — Encounter: Payer: Self-pay | Admitting: Hematology and Oncology

## 2023-03-02 ENCOUNTER — Ambulatory Visit: Payer: BC Managed Care – PPO

## 2023-03-02 ENCOUNTER — Telehealth: Payer: Self-pay | Admitting: *Deleted

## 2023-03-02 DIAGNOSIS — M25612 Stiffness of left shoulder, not elsewhere classified: Secondary | ICD-10-CM

## 2023-03-02 DIAGNOSIS — Z483 Aftercare following surgery for neoplasm: Secondary | ICD-10-CM

## 2023-03-02 DIAGNOSIS — R293 Abnormal posture: Secondary | ICD-10-CM

## 2023-03-02 DIAGNOSIS — I890 Lymphedema, not elsewhere classified: Secondary | ICD-10-CM

## 2023-03-02 DIAGNOSIS — C50412 Malignant neoplasm of upper-outer quadrant of left female breast: Secondary | ICD-10-CM

## 2023-03-02 NOTE — Telephone Encounter (Signed)
Per MD request, RN placed call to pt regarding negative (Not Detected) recent [employer] testing.  Pt appreciative of call and verbalized understanding.  

## 2023-03-02 NOTE — Therapy (Signed)
OUTPATIENT PHYSICAL THERAPY  UPPER EXTREMITY ONCOLOGY EVALUATION  Patient Name: Caitlyn Torres MRN: [id number] DOB:1961-06-05, 62 y.o., female Today's Date: 03/02/2023  END OF SESSION:  PT End of Session - 03/02/23 1006     Visit Number 3    Number of Visits 12    Date for PT Re-Evaluation 04/04/23    PT Start Time 1006    PT Stop Time 1051    PT Time Calculation (min) 45 min    Activity Tolerance Patient tolerated treatment well    Behavior During Therapy [facility] for tasks assessed/performed             Past Medical History:  Diagnosis Date   Anxiety    Arthritis    Cancer 03/2022   L breast   Depression    Family history of breast cancer 05/15/2022   Family history of pancreatic cancer 05/15/2022   Family history of prostate cancer 05/15/2022   GERD (gastroesophageal reflux disease)    History of hiatal hernia    patient believes she may have been told she has this years ago   Past Surgical History:  Procedure Laterality Date   BREAST BIOPSY  09/19/2022   MM LT RADIOACTIVE SEED EA ADD LESION LOC MAMMO GUIDE 09/19/2022 [facility]   BREAST BIOPSY  09/19/2022   MM LT RADIOACTIVE SEED LOC MAMMO GUIDE 09/19/2022 [facility]   BREAST LUMPECTOMY WITH RADIOACTIVE SEED AND AXILLARY LYMPH NODE DISSECTION Left 09/20/2022   Procedure: LEFT BREAST SEED LUMPECTOMY, LEFT AXILLARY LYMPH NODE DISSECTION;  Surgeon: Harriette Bouillon, MD;  Location: [facility] OR;  Service: General;  Laterality: Left;  GEN & PEC BLOCK   CERVIX SURGERY     Laser removal of pre-cancer cells   LASIK Bilateral    PORTACATH PLACEMENT Right 04/20/2022   Procedure: PORT PLACEMENT;  Surgeon: Harriette Bouillon, MD;  Location: [facility] OR;  Service: General;  Laterality: Right;   Patient Active Problem List   Diagnosis Date Noted   Other fatigue 08/28/2022   Acute pain 08/28/2022   Genetic testing 06/01/2022   Family history of breast cancer 05/15/2022   Family history of pancreatic cancer 05/15/2022   Family  history of prostate cancer 05/15/2022   Port-A-Cath in place 04/28/2022   Malignant neoplasm of upper-outer quadrant of left breast in female, estrogen receptor positive 03/29/2022      REFERRING PROVIDER: [name], MD  REFERRING DIAG: Left breast swelling/tenderness  THERAPY DIAG:  Malignant neoplasm of upper-outer quadrant of left female breast, unspecified estrogen receptor status  Stiffness of left shoulder, not elsewhere classified  Lymphedema of breast  Aftercare following surgery for neoplasm  Abnormal posture  ONSET DATE: Early March  Rationale for Evaluation and Treatment: Rehabilitation  SUBJECTIVE:  SUBJECTIVE STATEMENT:  I have had a respiratory thing for a few days. It could be the anastrozole. My forearm has a numbness down my arm and my thumb hurts. I felt better after last visit.  PERTINENT HISTORY:  03/14/2022:Screening detected left breast masses 1.5 cm and 1.3 cm with enlarged left axillary lymph node, biopsy revealed grade 3 IDC with lymph node being positive, ER 95%, PR 0%, HER2 2+ by IHC and FISH positive with a ratio 2.45 and a copy #4.9, the second biopsy was HER2 negative with a ratio  1. Neoadjuvant chemotherapy with Herceptin Perjeta and letrozole for 8 cycles.  (Our original recommendation is TCHP x6 cycles but patient is not willing to go through chemo) 2. left lumpectomy and ALND 09/20/2022: 0.6 cm IDC 2/14 lymph nodes, margins negative, ER 95%, PR 0%, HER2 positive 3. Followed by adjuvant radiation therapy  4.  Adjuvant antiestrogen therapy with neratinib She had left lumpectomy on 09/20/2022 with 2/14 LN's    PAIN:  Are you having pain? Yes NPRS scale:5/10 Pain location: LEFT Pain orientation: Left  PAIN TYPE: aching, sharp, and tight Pain description:  constant  Aggravating factors: being hugged, reaching, lying on left side Relieving factors: Compression?  PRECAUTIONS: Left UE lymphedema precautions  WEIGHT BEARING RESTRICTIONS: No  FALLS:  Has patient fallen in last 6 months? 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  SENSATION: Light touch: Deficits     POSTURE: forward head, rounded shoulders  UPPER EXTREMITY AROM/PROM:  A/PROM RIGHT   eval   Shoulder extension 42  Shoulder flexion 163  Shoulder abduction 179  Shoulder internal rotation 58  Shoulder external rotation 88    (Blank rows = not tested)  A/PROM LEFT   eval  Shoulder extension 37  Shoulder flexion 144  Shoulder abduction 140  Shoulder internal rotation 56  Shoulder external rotation 84    (Blank rows = not tested)  CERVICAL AROM: All within normal limits:     UPPER EXTREMITY STRENGTH:   LYMPHEDEMA ASSESSMENTS:   SURGERY TYPE/DATE: Left lumpectomy,09/20/2022  NUMBER OF LYMPH NODES REMOVED: 2+/14  CHEMOTHERAPY: No at her request, Did Herceptin, Perjeta and letrozole  RADIATION:yes but stopped early at her request  HORMONE TREATMENT: YES  INFECTIONS: NO   LYMPHEDEMA ASSESSMENTS:   LANDMARK RIGHT  eval  At axilla    15 cm proximal  to olecranon process   10 cm proximal to olecranon process   Olecranon process   15 cm proximal to ulnar styloid process   10 cm proximal to ulnar styloid process   Just proximal to ulnar styloid process   Across hand at thumb web space   At base of 2nd digit   (Blank rows = not tested)  LANDMARK LEFT  eval  At axilla    15 cm proximal to olecranon process   10 cm proximal to olecranon process  Olecranon process   15 cm proximal to ulnar styloid process   10 cm proximal to ulnar styloid process   Just proximal to ulnar styloid process   Across hand at thumb web space   At base of 2nd digit   (Blank rows = not tested)    L-DEX LYMPHEDEMA SCREENING: The patient was assessed using the L-Dex machine today to produce a lymphedema index baseline score. The patient will be reassessed on a regular basis (typically every 3 months) to obtain new L-Dex scores. If the score is > 6.5 points away from his/her baseline score indicating onset of subclinical lymphedema, it will be recommended to wear a compression garment for 4 weeks, 12 hours per day and then be reassessed. If the score continues to be > 6.5 points from baseline at reassessment, we will initiate lymphedema treatment. Assessing in this manner has a 95% rate of preventing clinically significant lymphedema.   BREAST COMPLAINTS QUESTIONNAIRE Pain:8 Heaviness:8 Swollen feeling:9 Tense Skin:8 Redness:6 Bra Print:9 Size of Pores:5 Hard feeling: 6 Total:   59  /80 A Score over 9 indicates lymphedema issues in the breast   TODAY'S TREATMENT:                                                                                                                                          DATE:  Pt permission and consent throughout each step of examination and treatment with modification and draping if requested when working on sensitive areas  03/02/2023 Peach dot pad made to put in lower breast area of enlarged pores In supine:  PTperformed with instruction to pt.Short neck, 5 diaphragmatic breaths, R axillary nodes and establishment of interaxillary pathway, L inguinal nodes and establishment of axilloinguinal pathway, then L breast moving fluid towards pathways spending extra time in any areas of fibrosis then retracing all steps. Pt then performed all steps with therapist observing and assisting with VC's and TC's prn Assessed left upper arm/forearm;several small cords felt. Therapist performed MFR to forearm and upper arm. Showed pt how to do NTS on herself.   02/27/2023 STM left UT, pectorals, lateral trunk and in SL to left scapular area and lats with cocoa butter Supine wand flexion and scaption x 5 PROM left shoulder flexion, scaption, abduction MFR to left axilla LTR x 5 with arms outstretched Scar massage left lateral breast. Doorway pectoral stretch x 3, wall stretch b x 3 Standing lat stretch x 2     PATIENT EDUCATION: 02/20/2022 Education details: Discussed POC, Left UQ tightness and limitations in ROM, breast swelling, compression bra, MLD Person educated: Patient Education method: Explanation Education comprehension: verbalized understanding  HOME EXERCISE PROGRAM: None given; will resume 4 post of exercises and hold on band exercises right now.  ASSESSMENT:  CLINICAL IMPRESSION:  Pt did very well with left breast MLD after instruction by PT. She used good form and stretch, with  gentle pressure. Pts arm felt better after MFR techniques   OBJECTIVE IMPAIRMENTS: decreased activity tolerance, decreased knowledge of condition, decreased ROM, increased edema, increased fascial restrictions, impaired UE functional use, postural dysfunction, and pain.   ACTIVITY LIMITATIONS: sleeping and reach over head  PARTICIPATION LIMITATIONS: interpersonal relationship  PERSONAL FACTORS: 3+ comorbidities: Left breast cancer s/p infusions and radiation  are also affecting patient's functional outcome.    REHAB POTENTIAL: Excellent  CLINICAL DECISION MAKING: Stable/uncomplicated  EVALUATION COMPLEXITY: Low  GOALS: Goals reviewed with patient? Yes  SHORT TERM GOALS= LONG TERM GOALS: Target date: 04/04/2023  Pt will be independent in MLD for reducing left breast edema Baseline: Goal status: INITIAL  2.  Pt will note decreased breast swelling/tenderness by atleast 50% Baseline:  Goal status: INITIAL  3.  Pt will be able to lie on her left side to sleep for short periods of time Baseline:  Goal status: INITIAL  4.  Pt will have left shoulder ROM WNL without increased pain with reaching Baseline:  Goal status: INITIAL  5.  Breast complaints survey will be reduced to no greater than 9 Baseline:  Goal status: INITIAL PLAN:  PT FREQUENCY: 2x/week  PT DURATION: 6 weeks  PLANNED INTERVENTIONS: Therapeutic exercises, Therapeutic activity, Patient/Family education, Self Care, Joint mobilization, Orthotic/Fit training, Manual lymph drainage, scar mobilization, Vasopneumatic device, Manual therapy, and Re-evaluation  PLAN FOR NEXT SESSION: how was peach foam for bra?, review L. breast MLD,  MFR to cording,STM to left UQ, PROM left shoulder, LTR with arms outstretched, wand exs, MLD to left breast and instruct pt., update HEP prn.  Waynette Buttery, PT 03/02/2023, 11:00 AM

## 2023-03-05 ENCOUNTER — Ambulatory Visit: Payer: BC Managed Care – PPO | Attending: Hematology and Oncology

## 2023-03-05 ENCOUNTER — Encounter: Payer: Self-pay | Admitting: Hematology and Oncology

## 2023-03-05 DIAGNOSIS — M25612 Stiffness of left shoulder, not elsewhere classified: Secondary | ICD-10-CM | POA: Diagnosis present

## 2023-03-05 DIAGNOSIS — C50412 Malignant neoplasm of upper-outer quadrant of left female breast: Secondary | ICD-10-CM | POA: Insufficient documentation

## 2023-03-05 DIAGNOSIS — Z483 Aftercare following surgery for neoplasm: Secondary | ICD-10-CM | POA: Diagnosis present

## 2023-03-05 DIAGNOSIS — R293 Abnormal posture: Secondary | ICD-10-CM | POA: Diagnosis present

## 2023-03-05 DIAGNOSIS — I890 Lymphedema, not elsewhere classified: Secondary | ICD-10-CM | POA: Insufficient documentation

## 2023-03-05 NOTE — Therapy (Signed)
OUTPATIENT PHYSICAL THERAPY  UPPER EXTREMITY ONCOLOGY EVALUATION  Patient Name: Caitlyn Torres MRN: [id number] DOB:28-Oct-1961, 62 y.o., female Today's Date: 03/05/2023  END OF SESSION:  PT End of Session - 03/05/23 1005     Visit Number 4    Number of Visits 12    Date for PT Re-Evaluation 04/04/23    PT Start Time 1007    PT Stop Time 1057    PT Time Calculation (min) 50 min    Activity Tolerance Patient tolerated treatment well    Behavior During Therapy [facility] for tasks assessed/performed             Past Medical History:  Diagnosis Date   Anxiety    Arthritis    Cancer 03/2022   L breast   Depression    Family history of breast cancer 05/15/2022   Family history of pancreatic cancer 05/15/2022   Family history of prostate cancer 05/15/2022   GERD (gastroesophageal reflux disease)    History of hiatal hernia    patient believes she may have been told she has this years ago   Past Surgical History:  Procedure Laterality Date   BREAST BIOPSY  09/19/2022   MM LT RADIOACTIVE SEED EA ADD LESION LOC MAMMO GUIDE 09/19/2022 [facility]   BREAST BIOPSY  09/19/2022   MM LT RADIOACTIVE SEED LOC MAMMO GUIDE 09/19/2022 [facility]   BREAST LUMPECTOMY WITH RADIOACTIVE SEED AND AXILLARY LYMPH NODE DISSECTION Left 09/20/2022   Procedure: LEFT BREAST SEED LUMPECTOMY, LEFT AXILLARY LYMPH NODE DISSECTION;  Surgeon: Harriette Bouillon, MD;  Location: [facility] OR;  Service: General;  Laterality: Left;  GEN & PEC BLOCK   CERVIX SURGERY     Laser removal of pre-cancer cells   LASIK Bilateral    PORTACATH PLACEMENT Right 04/20/2022   Procedure: PORT PLACEMENT;  Surgeon: Harriette Bouillon, MD;  Location: [facility] OR;  Service: General;  Laterality: Right;   Patient Active Problem List   Diagnosis Date Noted   Other fatigue 08/28/2022   Acute pain 08/28/2022   Genetic testing 06/01/2022   Family history of breast cancer 05/15/2022   Family history of pancreatic cancer 05/15/2022   Family  history of prostate cancer 05/15/2022   Port-A-Cath in place 04/28/2022   Malignant neoplasm of upper-outer quadrant of left breast in female, estrogen receptor positive 03/29/2022      REFERRING PROVIDER: [name], MD  REFERRING DIAG: Left breast swelling/tenderness  THERAPY DIAG:  Malignant neoplasm of upper-outer quadrant of left female breast, unspecified estrogen receptor status  Stiffness of left shoulder, not elsewhere classified  Lymphedema of breast  Aftercare following surgery for neoplasm  Abnormal posture  ONSET DATE: Early March  Rationale for Evaluation and Treatment: Rehabilitation  SUBJECTIVE:  SUBJECTIVE STATEMENT:  I wore the peach foam inside the pocket of my bra.  I tried the MLD at home and I think I did good.  I still feel the cord, but I stretched it yesterday a lot.  PERTINENT HISTORY:  03/14/2022:Screening detected left breast masses 1.5 cm and 1.3 cm with enlarged left axillary lymph node, biopsy revealed grade 3 IDC with lymph node being positive, ER 95%, PR 0%, HER2 2+ by IHC and FISH positive with a ratio 2.45 and a copy #4.9, the second biopsy was HER2 negative with a ratio  1. Neoadjuvant chemotherapy with Herceptin Perjeta and letrozole for 8 cycles.  (Our original recommendation is TCHP x6 cycles but patient is not willing to go through chemo) 2. left lumpectomy and ALND 09/20/2022: 0.6 cm IDC 2/14 lymph nodes, margins negative, ER 95%, PR 0%, HER2 positive 3. Followed by adjuvant radiation therapy  4.  Adjuvant antiestrogen therapy with neratinib She had left lumpectomy on 09/20/2022 with 2/14 LN's    PAIN:  Are you having pain? Yes NPRS scale:0/10 at rest, 3/10 with movements left upper chest and lateral trunk Pain location: LEFT Pain orientation: Left   PAIN TYPE: tight and sore Pain description: constant  Aggravating factors: being hugged, reaching, lying on left side Relieving factors: Compression?  PRECAUTIONS: Left UE lymphedema precautions  WEIGHT BEARING RESTRICTIONS: No  FALLS:  Has patient fallen in last 6 months? 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  SENSATION: Light touch: Deficits     POSTURE: forward head, rounded shoulders  UPPER EXTREMITY AROM/PROM:  A/PROM RIGHT   eval   Shoulder extension 42  Shoulder flexion 163  Shoulder abduction 179  Shoulder internal rotation 58  Shoulder external rotation 88    (Blank rows = not tested)  A/PROM LEFT   eval  Shoulder extension 37  Shoulder flexion 144  Shoulder abduction 140  Shoulder internal rotation 56  Shoulder external rotation 84    (Blank rows = not tested)  CERVICAL AROM: All within normal limits:     UPPER EXTREMITY STRENGTH:   LYMPHEDEMA ASSESSMENTS:   SURGERY TYPE/DATE: Left lumpectomy,09/20/2022  NUMBER OF LYMPH NODES REMOVED: 2+/14  CHEMOTHERAPY: No at her request, Did Herceptin, Perjeta and letrozole  RADIATION:yes but stopped early at her request  HORMONE TREATMENT: YES  INFECTIONS: NO   LYMPHEDEMA ASSESSMENTS:    LANDMARK RIGHT  eval  At axilla    15 cm proximal to olecranon process   10 cm proximal to olecranon process   Olecranon process   15 cm proximal to ulnar styloid process   10 cm proximal to ulnar styloid process   Just proximal to ulnar styloid process   Across hand at thumb web space   At base of 2nd digit   (Blank rows = not tested)  LANDMARK LEFT  eval  At axilla    15  cm proximal to olecranon process   10 cm proximal to olecranon process   Olecranon process   15 cm proximal to ulnar styloid process   10 cm proximal to ulnar styloid process   Just proximal to ulnar styloid process   Across hand at thumb web space   At base of 2nd digit   (Blank rows = not tested)    L-DEX LYMPHEDEMA SCREENING: The patient was assessed using the L-Dex machine today to produce a lymphedema index baseline score. The patient will be reassessed on a regular basis (typically every 3 months) to obtain new L-Dex scores. If the score is > 6.5 points away from his/her baseline score indicating onset of subclinical lymphedema, it will be recommended to wear a compression garment for 4 weeks, 12 hours per day and then be reassessed. If the score continues to be > 6.5 points from baseline at reassessment, we will initiate lymphedema treatment. Assessing in this manner has a 95% rate of preventing clinically significant lymphedema.   BREAST COMPLAINTS QUESTIONNAIRE Pain:8 Heaviness:8 Swollen feeling:9 Tense Skin:8 Redness:6 Bra Print:9 Size of Pores:5 Hard feeling: 6 Total:   59  /80 A Score over 9 indicates lymphedema issues in the breast   TODAY'S TREATMENT:                                                                                                                                          DATE:  Pt permission and consent throughout each step of examination and treatment with modification and draping if requested when working on sensitive areas  03/05/2023 STM left UT, pectorals,  lateral trunk and in SL to left scapular area and lats with cocoa butter Supine wand flexion and scaption x 5 PROM left shoulder flexion, scaption, abduction MFR to left axilla and left upper arm and forearm area of cording n supine: PTperformed with instruction to pt.Short neck, 5 diaphragmatic breaths, R axillary nodes and establishment of interaxillary pathway, L inguinal nodes and establishment of axilloinguinal pathway, then L breast moving fluid towards pathways spending extra time in any areas of fibrosis then retracing all steps.    03/02/2023 Peach dot pad made to put in lower breast area of enlarged pores In supine: PTperformed with instruction to pt.Short neck, 5 diaphragmatic breaths, R axillary nodes and establishment of interaxillary pathway, L inguinal nodes and establishment of axilloinguinal pathway, then L breast moving fluid towards pathways spending extra time in any areas of fibrosis then retracing all steps. Pt then performed all steps with therapist observing and assisting with VC's and TC's prn Assessed left upper arm/forearm;several small cords felt. Therapist performed MFR to forearm and upper arm. Showed pt how to do NTS on herself.   02/27/2023 STM left UT, pectorals, lateral trunk and in SL to left scapular area and lats with cocoa butter Supine wand flexion and scaption x 5 PROM left shoulder flexion,  scaption, abduction MFR to left axilla LTR x 5 with arms outstretched Scar massage left lateral breast. Doorway pectoral stretch x 3, wall stretch b x 3 Standing lat stretch x 2     PATIENT EDUCATION: 02/20/2022 Education details: Discussed POC, Left UQ tightness and limitations in ROM, breast swelling, compression bra, MLD Person educated: Patient Education method: Explanation Education comprehension: verbalized understanding  HOME EXERCISE PROGRAM: None given; will resume 4 post of exercises and hold on band exercises right now.  ASSESSMENT:  CLINICAL  IMPRESSION:  Pt with continued tightness left UQ, with tenderness throughout.  Peach foam did well at inferior lateral breast to reduce enlarged pores. Pt is compliant with HEP.   OBJECTIVE IMPAIRMENTS: decreased activity tolerance, decreased knowledge of condition, decreased ROM, increased edema, increased fascial restrictions, impaired UE functional use, postural dysfunction, and pain.   ACTIVITY LIMITATIONS: sleeping and reach over head  PARTICIPATION LIMITATIONS: interpersonal relationship  PERSONAL FACTORS: 3+ comorbidities: Left breast cancer s/p infusions and radiation  are also affecting patient's functional outcome.   REHAB POTENTIAL: Excellent  CLINICAL DECISION MAKING: Stable/uncomplicated  EVALUATION COMPLEXITY: Low  GOALS: Goals reviewed with patient? Yes  SHORT TERM GOALS= LONG TERM GOALS: Target date: 04/04/2023  Pt will be independent in MLD for reducing left breast edema Baseline: Goal status: INITIAL  2.  Pt will note decreased breast swelling/tenderness by atleast 50% Baseline:  Goal status: INITIAL  3.  Pt will be able to lie on her left side to sleep for short periods of time Baseline:  Goal status: INITIAL  4.  Pt will have left shoulder ROM WNL without increased pain with reaching Baseline:  Goal status: INITIAL  5.  Breast complaints survey will be reduced to no greater than 9 Baseline:  Goal status: INITIAL PLAN:  PT FREQUENCY: 2x/week  PT DURATION: 6 weeks  PLANNED INTERVENTIONS: Therapeutic exercises, Therapeutic activity, Patient/Family education, Self Care, Joint mobilization, Orthotic/Fit training, Manual lymph drainage, scar mobilization, Vasopneumatic device, Manual therapy, and Re-evaluation  PLAN FOR NEXT SESSION: how was peach foam for bra?, review L. breast MLD,  MFR to cording,STM to left UQ, PROM left shoulder, LTR with arms outstretched, wand exs, MLD to left breast and instruct pt., update HEP prn.  Waynette Buttery,  PT 03/05/2023, 10:58 AM

## 2023-03-08 ENCOUNTER — Ambulatory Visit: Payer: BC Managed Care – PPO

## 2023-03-08 DIAGNOSIS — C50412 Malignant neoplasm of upper-outer quadrant of left female breast: Secondary | ICD-10-CM

## 2023-03-08 DIAGNOSIS — R293 Abnormal posture: Secondary | ICD-10-CM

## 2023-03-08 DIAGNOSIS — Z483 Aftercare following surgery for neoplasm: Secondary | ICD-10-CM

## 2023-03-08 DIAGNOSIS — I890 Lymphedema, not elsewhere classified: Secondary | ICD-10-CM

## 2023-03-08 DIAGNOSIS — M25612 Stiffness of left shoulder, not elsewhere classified: Secondary | ICD-10-CM

## 2023-03-08 NOTE — Therapy (Signed)
OUTPATIENT PHYSICAL THERAPY  UPPER EXTREMITY ONCOLOGY EVALUATION  Patient Name: Caitlyn Torres MRN: [id number] DOB:1961-05-23, 62 y.o., female Today's Date: 03/08/2023  END OF SESSION:  PT End of Session - 03/08/23 0804     Visit Number 5    Number of Visits 12    Date for PT Re-Evaluation 04/04/23    PT Start Time 0805    PT Stop Time 0852    PT Time Calculation (min) 47 min    Activity Tolerance Patient tolerated treatment well    Behavior During Therapy [facility] for tasks assessed/performed             Past Medical History:  Diagnosis Date   Anxiety    Arthritis    Cancer 03/2022   L breast   Depression    Family history of breast cancer 05/15/2022   Family history of pancreatic cancer 05/15/2022   Family history of prostate cancer 05/15/2022   GERD (gastroesophageal reflux disease)    History of hiatal hernia    patient believes she may have been told she has this years ago   Past Surgical History:  Procedure Laterality Date   BREAST BIOPSY  09/19/2022   MM LT RADIOACTIVE SEED EA ADD LESION LOC MAMMO GUIDE 09/19/2022 [facility]   BREAST BIOPSY  09/19/2022   MM LT RADIOACTIVE SEED LOC MAMMO GUIDE 09/19/2022 [facility]   BREAST LUMPECTOMY WITH RADIOACTIVE SEED AND AXILLARY LYMPH NODE DISSECTION Left 09/20/2022   Procedure: LEFT BREAST SEED LUMPECTOMY, LEFT AXILLARY LYMPH NODE DISSECTION;  Surgeon: Harriette Bouillon, MD;  Location: [facility] OR;  Service: General;  Laterality: Left;  GEN & PEC BLOCK   CERVIX SURGERY     Laser removal of pre-cancer cells   LASIK Bilateral    PORTACATH PLACEMENT Right 04/20/2022   Procedure: PORT PLACEMENT;  Surgeon: Harriette Bouillon, MD;  Location: [facility];  Service: General;  Laterality: Right;   Patient Active Problem List   Diagnosis Date Noted   Other fatigue 08/28/2022   Acute pain 08/28/2022   Genetic testing 06/01/2022   Family history of breast cancer 05/15/2022   Family history of pancreatic cancer 05/15/2022   Family  history of prostate cancer 05/15/2022   Port-A-Cath in place 04/28/2022   Malignant neoplasm of upper-outer quadrant of left breast in female, estrogen receptor positive 03/29/2022      REFERRING PROVIDER: [name], MD  REFERRING DIAG: Left breast swelling/tenderness  THERAPY DIAG:  Malignant neoplasm of upper-outer quadrant of left female breast, unspecified estrogen receptor status  Stiffness of left shoulder, not elsewhere classified  Lymphedema of breast  Aftercare following surgery for neoplasm  Abnormal posture  ONSET DATE: Early March  Rationale for Evaluation and Treatment: Rehabilitation  SUBJECTIVE:  SUBJECTIVE STATEMENT:  I am hurting really bad under my left breast which started Tuesday morning. I hurt all day Tuesday and Im not sure if its from my coughing or the pad. Upper left chest is still tender  PERTINENT HISTORY:  03/14/2022:Screening detected left breast masses 1.5 cm and 1.3 cm with enlarged left axillary lymph node, biopsy revealed grade 3 IDC with lymph node being positive, ER 95%, PR 0%, HER2 2+ by IHC and FISH positive with a ratio 2.45 and a copy #4.9, the second biopsy was HER2 negative with a ratio  1. Neoadjuvant chemotherapy with Herceptin Perjeta and letrozole for 8 cycles.  (Our original recommendation is TCHP x6 cycles but patient is not willing to go through chemo) 2. left lumpectomy and ALND 09/20/2022: 0.6 cm IDC 2/14 lymph nodes, margins negative, ER 95%, PR 0%, HER2 positive 3. Followed by adjuvant radiation therapy  4.  Adjuvant antiestrogen therapy with neratinib She had left lumpectomy on 09/20/2022 with 2/14 LN's    PAIN:  Are you having pain? Yes NPRS scale:0/10 at rest, 3/10 with movements left upper chest and lateral trunk, lower breast tender to  breast and hurts to move 5/10 Pain location: LEFT Pain orientation: Left  PAIN TYPE: tight and sore Pain description: constant  Aggravating factors: being hugged, reaching, lying on left side Relieving factors: Compression?  PRECAUTIONS: Left UE lymphedema precautions  WEIGHT BEARING RESTRICTIONS: No  FALLS:  Has patient fallen in last 6 months? 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  SENSATION: Light touch: Deficits     POSTURE: forward head, rounded shoulders  UPPER EXTREMITY AROM/PROM:  A/PROM RIGHT   eval   Shoulder extension 42  Shoulder flexion 163  Shoulder abduction 179  Shoulder internal rotation 58  Shoulder external rotation 88    (Blank rows = not tested)  A/PROM LEFT   eval  Shoulder extension 37  Shoulder flexion 144  Shoulder abduction 140  Shoulder internal rotation 56  Shoulder external rotation 84    (Blank rows = not tested)  CERVICAL AROM: All within normal limits:     UPPER EXTREMITY STRENGTH:   LYMPHEDEMA ASSESSMENTS:   SURGERY TYPE/DATE: Left lumpectomy,09/20/2022  NUMBER OF LYMPH NODES REMOVED: 2+/14  CHEMOTHERAPY: No at her request, Did Herceptin, Perjeta and letrozole  RADIATION:yes but stopped early at her  request  HORMONE TREATMENT: YES  INFECTIONS: NO   LYMPHEDEMA ASSESSMENTS:   LANDMARK RIGHT  eval  At axilla    15 cm proximal to olecranon process   10 cm proximal to olecranon process   Olecranon process   15 cm proximal to ulnar styloid process   10 cm proximal to ulnar styloid process   Just proximal to ulnar styloid process   Across hand at thumb web space   At base of 2nd digit   (Blank rows = not tested)  LANDMARK LEFT  eval  At axilla    15 cm proximal to olecranon process   10 cm proximal to olecranon process   Olecranon process   15 cm proximal to ulnar styloid process   10 cm proximal to ulnar styloid process   Just proximal to ulnar styloid process   Across hand at thumb web space   At base of 2nd digit   (Blank rows = not tested)    L-DEX LYMPHEDEMA SCREENING: The patient was assessed using the L-Dex machine today to produce a lymphedema index baseline score. The patient will be reassessed on a regular basis (typically every 3 months) to obtain new L-Dex scores. If the score is > 6.5 points away from his/her baseline score indicating onset of subclinical lymphedema, it will be recommended to wear a compression garment for 4 weeks, 12 hours per day and then be reassessed. If the score continues to be > 6.5 points from baseline at reassessment, we will initiate lymphedema treatment. Assessing in this manner has a 95% rate of preventing clinically significant lymphedema.   BREAST COMPLAINTS QUESTIONNAIRE Pain:8 Heaviness:8 Swollen feeling:9 Tense Skin:8 Redness:6 Bra Print:9 Size of Pores:5 Hard feeling: 6 Total:   59  /80 A Score over 9 indicates lymphedema issues in the breast   TODAY'S TREATMENT:                                                                                                                                          DATE:  Pt permission and consent throughout each step of examination and treatment with modification and draping if  requested when working on sensitive areas  03/08/2023  STM left UT, pectorals, lateral trunk and left lower rib cage with cocoa butter, and SL to left scapular region with STM and cupping Supine wand flexion and scaption x 5 PROM left shoulder flexion, scaption, abduction MFR to left axilla and left upper arm and forearm area of cording Gray foam pad in TG soft made to pad rib area from bra 03/05/2023 STM left UT, pectorals, lateral trunk and in SL to left scapular area and lats with cocoa butter Supine wand flexion and scaption x 5 PROM left shoulder flexion, scaption, abduction MFR to left axilla and left upper arm and forearm area of cording n supine: PTperformed with instruction to pt.Short neck, 5 diaphragmatic breaths, R axillary nodes and establishment of interaxillary pathway, L inguinal nodes and establishment of axilloinguinal pathway, then L breast moving fluid towards pathways spending extra time in any areas of fibrosis then retracing all steps.    03/02/2023 Peach dot pad made to put in lower breast area of enlarged pores In supine: PTperformed with instruction to pt.Short neck, 5 diaphragmatic breaths, R axillary nodes and establishment of interaxillary pathway, L inguinal nodes and establishment of axilloinguinal pathway, then L breast moving fluid towards pathways spending extra time in any areas of fibrosis then  retracing all steps. Pt then performed all steps with therapist observing and assisting with VC's and TC's prn Assessed left upper arm/forearm;several small cords felt. Therapist performed MFR to forearm and upper arm. Showed pt how to do NTS on herself.   02/27/2023 STM left UT, pectorals, lateral trunk and in SL to left scapular area and lats with cocoa butter Supine wand flexion and scaption x 5 PROM left shoulder flexion, scaption, abduction MFR to left axilla LTR x 5 with arms outstretched Scar massage left lateral breast. Doorway pectoral stretch x 3, wall  stretch b x 3 Standing lat stretch x 2     PATIENT EDUCATION: 02/20/2022 Education details: Discussed POC, Left UQ tightness and limitations in ROM, breast swelling, compression bra, MLD Person educated: Patient Education method: Explanation Education comprehension: verbalized understanding  HOME EXERCISE PROGRAM: None given; will resume 4 post of exercises and hold on band exercises right now.  ASSESSMENT:  CLINICAL IMPRESSION:  Pt has new complaint of pain in left ribcage area. ? From coughing versus peach foam pad that she wore overnight. Continued STM, cording release and added cupping to scapular area due to multiple shortened areas. Added foam pad under bra to pad ribs. ROM improved nicely after supine wand exs. Enlarged pores remain at breast and lacked time to do MLD today, but pt has been doing at home.  OBJECTIVE IMPAIRMENTS: decreased activity tolerance, decreased knowledge of condition, decreased ROM, increased edema, increased fascial restrictions, impaired UE functional use, postural dysfunction, and pain.   ACTIVITY LIMITATIONS: sleeping and reach over head  PARTICIPATION LIMITATIONS: interpersonal relationship  PERSONAL FACTORS: 3+ comorbidities: Left breast cancer s/p infusions and radiation  are also affecting patient's functional outcome.   REHAB POTENTIAL: Excellent  CLINICAL DECISION MAKING: Stable/uncomplicated  EVALUATION COMPLEXITY: Low  GOALS: Goals reviewed with patient? Yes  SHORT TERM GOALS= LONG TERM GOALS: Target date: 04/04/2023  Pt will be independent in MLD for reducing left breast edema Baseline: Goal status: INITIAL  2.  Pt will note decreased breast swelling/tenderness by atleast 50% Baseline:  Goal status: INITIAL  3.  Pt will be able to lie on her left side to sleep for short periods of time Baseline:  Goal status: INITIAL  4.  Pt will have left shoulder ROM WNL without increased pain with reaching Baseline:  Goal status:  INITIAL  5.  Breast complaints survey will be reduced to no greater than 9 Baseline:  Goal status: INITIAL PLAN:  PT FREQUENCY: 2x/week  PT DURATION: 6 weeks  PLANNED INTERVENTIONS: Therapeutic exercises, Therapeutic activity, Patient/Family education, Self Care, Joint mobilization, Orthotic/Fit training, Manual lymph drainage, scar mobilization, Vasopneumatic device, Manual therapy, and Re-evaluation  PLAN FOR NEXT SESSION: how was peach foam for bra?, review L. breast MLD,  MFR to cording,STM to left UQ, PROM left shoulder, LTR with arms outstretched, wand exs, MLD to left breast and instruct pt., update HEP prn.  Waynette Buttery, PT 03/08/2023, 9:03 AM

## 2023-03-09 ENCOUNTER — Inpatient Hospital Stay: Payer: BC Managed Care – PPO

## 2023-03-09 ENCOUNTER — Encounter: Payer: Self-pay | Admitting: Adult Health

## 2023-03-09 ENCOUNTER — Inpatient Hospital Stay (HOSPITAL_BASED_OUTPATIENT_CLINIC_OR_DEPARTMENT_OTHER): Payer: BC Managed Care – PPO | Admitting: Adult Health

## 2023-03-09 VITALS — BP 142/85 | HR 67 | Resp 14

## 2023-03-09 DIAGNOSIS — C50412 Malignant neoplasm of upper-outer quadrant of left female breast: Secondary | ICD-10-CM

## 2023-03-09 DIAGNOSIS — Z170 Estrogen receptor positive status [ER+]: Secondary | ICD-10-CM | POA: Diagnosis not present

## 2023-03-09 LAB — CMP (CANCER CENTER ONLY)
ALT: 21 U/L (ref 0–44)
AST: 29 U/L (ref 15–41)
Albumin: 4.2 g/dL (ref 3.5–5.0)
Alkaline Phosphatase: 61 U/L (ref 38–126)
Anion gap: 5 (ref 5–15)
BUN: 6 mg/dL (ref 6–20)
CO2: 29 mmol/L (ref 22–32)
Calcium: 9.8 mg/dL (ref 8.9–10.3)
Chloride: 102 mmol/L (ref 98–111)
Creatinine: 0.56 mg/dL (ref 0.44–1.00)
GFR, Estimated: >60 mL/min (ref >60)
Glucose, Bld: 86 mg/dL (ref 70–99)
Potassium: 3.7 mmol/L (ref 3.5–5.1)
Sodium: 136 mmol/L (ref 135–145)
Total Bilirubin: 0.4 mg/dL (ref 0.3–1.2)
Total Protein: 6.9 g/dL (ref 6.5–8.1)

## 2023-03-09 LAB — CBC WITH DIFFERENTIAL (CANCER CENTER ONLY)
Abs Immature Granulocytes: 0.01 10*3/uL (ref 0.00–0.07)
Basophils Absolute: 0 10*3/uL (ref 0.0–0.1)
Basophils Relative: 1 %
Eosinophils Absolute: 0.1 10*3/uL (ref 0.0–0.5)
Eosinophils Relative: 4 %
HCT: 38.4 % (ref 36.0–46.0)
Hemoglobin: 12.9 g/dL (ref 12.0–15.0)
Immature Granulocytes: 0 %
Lymphocytes Relative: 29 %
Lymphs Abs: 0.8 10*3/uL (ref 0.7–4.0)
MCH: 28 pg (ref 26.0–34.0)
MCHC: 33.6 g/dL (ref 30.0–36.0)
MCV: 83.5 fL (ref 80.0–100.0)
Monocytes Absolute: 0.4 10*3/uL (ref 0.1–1.0)
Monocytes Relative: 13 %
Neutro Abs: 1.5 10*3/uL — ABNORMAL LOW (ref 1.7–7.7)
Neutrophils Relative %: 53 %
Platelet Count: 198 10*3/uL (ref 150–400)
RBC: 4.6 MIL/uL (ref 3.87–5.11)
RDW: 15 % (ref 11.5–15.5)
WBC Count: 2.9 10*3/uL — ABNORMAL LOW (ref 4.0–10.5)
nRBC: 0 % (ref 0.0–0.2)

## 2023-03-09 MED ORDER — SODIUM CHLORIDE 0.9% FLUSH
10.0000 mL | INTRAVENOUS | Status: DC | PRN
  Administered 2023-03-09: 10 mL

## 2023-03-09 MED ORDER — SODIUM CHLORIDE 0.9 % IV SOLN: Freq: Once | INTRAVENOUS | Status: AC

## 2023-03-09 MED ORDER — SODIUM CHLORIDE 0.9 % IV SOLN
3.6000 mg/kg | Freq: Once | INTRAVENOUS | Status: AC
  Administered 2023-03-09: 200 mg via INTRAVENOUS
  Filled 2023-03-09: qty 10

## 2023-03-09 MED ORDER — HEPARIN SOD (PORK) LOCK FLUSH 100 UNIT/ML IV SOLN
500.0000 [IU] | Freq: Once | INTRAVENOUS | Status: AC | PRN
  Administered 2023-03-09: 500 [IU]

## 2023-03-09 MED ORDER — DIPHENHYDRAMINE HCL 25 MG PO CAPS
25.0000 mg | ORAL_CAPSULE | Freq: Once | ORAL | Status: AC
  Administered 2023-03-09: 25 mg via ORAL
  Filled 2023-03-09: qty 1

## 2023-03-09 MED ORDER — PROCHLORPERAZINE MALEATE 10 MG PO TABS
10.0000 mg | ORAL_TABLET | Freq: Once | ORAL | Status: AC
  Administered 2023-03-09: 10 mg via ORAL
  Filled 2023-03-09: qty 1

## 2023-03-09 NOTE — Progress Notes (Signed)
Ok to treat with ECHO from January per Mardella Layman, NP

## 2023-03-09 NOTE — Patient Instructions (Signed)
[facility]  Discharge Instructions: Thank you for choosing [facility] to provide your oncology and hematology care.   If you have a lab appointment with the Cancer Center, please go directly to the Cancer Center and check in at the registration area.   Wear comfortable clothing and clothing appropriate for easy access to any Portacath or PICC line.   We strive to give you quality time with your provider. You may need to reschedule your appointment if you arrive late (15 or more minutes).  Arriving late affects you and other patients whose appointments are after yours.  Also, if you miss three or more appointments without notifying the office, you may be dismissed from the clinic at the provider's discretion.      For prescription refill requests, have your pharmacy contact our office and allow 72 hours for refills to be completed.    Today you received the following chemotherapy and/or immunotherapy agents: Kadcyla.       To help prevent nausea and vomiting after your treatment, we encourage you to take your nausea medication as directed.  BELOW ARE SYMPTOMS THAT SHOULD BE REPORTED IMMEDIATELY: *FEVER GREATER THAN 100.4 F (38 C) OR HIGHER *CHILLS OR SWEATING *NAUSEA AND VOMITING THAT IS NOT CONTROLLED WITH YOUR NAUSEA MEDICATION *UNUSUAL SHORTNESS OF BREATH *UNUSUAL BRUISING OR BLEEDING *URINARY PROBLEMS (pain or burning when urinating, or frequent urination) *BOWEL PROBLEMS (unusual diarrhea, constipation, pain near the anus) TENDERNESS IN MOUTH AND THROAT WITH OR WITHOUT PRESENCE OF ULCERS (sore throat, sores in mouth, or a toothache) UNUSUAL RASH, SWELLING OR PAIN  UNUSUAL VAGINAL DISCHARGE OR ITCHING   Items with * indicate a potential emergency and should be followed up as soon as possible or go to the Emergency Department if any problems should occur.  Please show the CHEMOTHERAPY ALERT CARD or IMMUNOTHERAPY ALERT CARD at  check-in to the Emergency Department and triage nurse.  Should you have questions after your visit or need to cancel or reschedule your appointment, please contact [facility]  Dept: [phone]  and follow the prompts.  Office hours are 8:00 a.m. to 4:30 p.m. Monday - Friday. Please note that voicemails left after 4:00 p.m. may not be returned until the following business day.  We are closed weekends and major holidays. You have access to a nurse at all times for urgent questions. Please call the main number to the clinic Dept: [phone] and follow the prompts.   For any non-urgent questions, you may also contact your provider using MyChart. We now offer e-Visits for anyone 18 and older to request care online for non-urgent symptoms. For details visit [email or web address].   Also download the MyChart app! Go to the app store, search "MyChart", open the app, select , and log in with your MyChart username and password.   

## 2023-03-09 NOTE — Progress Notes (Signed)
[facility] Cancer Follow up:    Caitlyn Jews, NP [address] [place] [address]   DIAGNOSIS:  Cancer Staging  Malignant neoplasm of upper-outer quadrant of left breast in female, estrogen receptor positive (HCC) Staging form: Breast, AJCC 8th Edition - Clinical: Stage IIA (cT1c, cN1, cM0, G3, ER+, PR-, HER2+) - Signed by [name], MD on 03/29/2022 Stage prefix: Initial diagnosis Histologic grading system: 3 grade system   SUMMARY OF ONCOLOGIC HISTORY: Oncology History  Malignant neoplasm of upper-outer quadrant of left breast in female, estrogen receptor positive (HCC)  03/14/2022 Initial Diagnosis   Screening detected left breast masses 1.5 cm and 1.3 cm with enlarged left axillary lymph node, biopsy revealed grade 3 IDC with lymph node being positive, ER 95%, PR 0%, HER2 2+ by IHC and FISH positive with a ratio 2.45 and a copy #4.9, the second biopsy was HER2 negative with a ratio 2.15 and a copy #3.55   03/29/2022 Cancer Staging   Staging form: Breast, AJCC 8th Edition - Clinical: Stage IIA (cT1c, cN1, cM0, G3, ER+, PR-, HER2+) - Signed by [name], MD on 03/29/2022 Stage prefix: Initial diagnosis Histologic grading system: 3 grade system   03/29/2022 - 09/2022 Anti-estrogen oral therapy   Letrozole daily   04/21/2022 -  Chemotherapy   Herceptin/Perjeta every 21 days initially x 3, will reassess with MRI and then will decide whether to continue or to add chemo.  (Original recommendation was TCHP x 6)      Genetic Testing   [facility] CancerNext-Expanded Panel was Negative. Report date is 05/30/2022.  The CancerNext-Expanded gene panel offered by [facility] and includes sequencing, rearrangement, and RNA analysis for the following 77 genes: AIP, ALK, APC, ATM, AXIN2, BAP1, BARD1, BLM, BMPR1A, BRCA1, BRCA2, BRIP1, CDC73, CDH1, CDK4, CDKN1B, CDKN2A, CHEK2, CTNNA1, DICER1, FANCC, FH, FLCN, GALNT12, KIF1B, LZTR1, MAX, MEN1, MET, MLH1, MSH2, MSH3,  MSH6, MUTYH, NBN, NF1, NF2, NTHL1, PALB2, PHOX2B, PMS2, POT1, PRKAR1A, PTCH1, PTEN, RAD51C, RAD51D, RB1, RECQL, RET, SDHA, SDHAF2, SDHB, SDHC, SDHD, SMAD4, SMARCA4, SMARCB1, SMARCE1, STK11, SUFU, TMEM127, TP53, TSC1, TSC2, VHL and XRCC2 (sequencing and deletion/duplication); EGFR, EGLN1, HOXB13, KIT, MITF, PDGFRA, POLD1, and POLE (sequencing only); EPCAM and GREM1 (deletion/duplication only).    04/21/2022 - 09/14/2022 Chemotherapy   Given neoadjuvantly with Letrozole (declined neoadjuvant chemotherapy with TCHP) Patient is on Treatment Plan : BREAST Trastuzumab  + Pertuzumab q21d x 13 cycles      09/20/2022 Surgery   left lumpectomy and ALND: 0.6 cm IDC 2/14 lymph nodes, margins negative, ER 95%, PR 0%, HER2 positive   10/13/2022 -  Chemotherapy   Patient is on Treatment Plan : BREAST ADO-Trastuzumab Emtansine (Kadcyla) q21d     11/07/2022 - 12/06/2022 Radiation Therapy   Site/dose:   The patient had a prescriptive dose of 50.4 Gy in 28 fractions to the breast and SCLV region using a 4-field approach. This was to be delivered using a 3-D conformal technique. The patient did not receive the planned boost over 10 Gy in 5 fractions.  In total, she completed 36 Gy in 20 of the 33 planned fractions.   02/2023 -  Anti-estrogen oral therapy   Anastrozole daily     CURRENT THERAPY: Kadcyla; Anastrozole  INTERVAL HISTORY: Caitlyn Torres 62 y.o. female returns for follow-up prior to receiving her next cycle of Kadcyla.  She is tolerating it well her only side effect is tiredness.  She notes she had a cold over the past  week and wants me to listen to her lungs.  She denies any significant shortness of breath cough fever or chills.  She is feeling better.  Her most recent echocardiogram was completed on December 04, 2022 demonstrating a left ventricular ejection fraction of 56%.  She also started on anastrozole and is tolerating it moderately well.  Initially she was fatigued however this is slowly  improving.  She has vaginal dryness and uses natural moisturizers and suppositories.  She has not been using estradiol.  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     Patient Active Problem List   Diagnosis Date Noted   Other fatigue 08/28/2022   Acute pain 08/28/2022   Genetic testing 06/01/2022   Family history of breast cancer 05/15/2022   Family history of pancreatic cancer 05/15/2022   Family history of prostate cancer 05/15/2022   Port-A-Cath in place 04/28/2022   Malignant neoplasm of upper-outer quadrant of left breast in female, estrogen receptor positive (HCC) 03/29/2022    is allergic to aleve [naproxen sodium], aspirin, macrobid [nitrofurantoin], motrin [ibuprofen], and tylenol [acetaminophen].  MEDICAL HISTORY: Past Medical History:  Diagnosis Date   Anxiety    Arthritis    Cancer (HCC) 03/2022   L breast   Depression    Family history of breast cancer 05/15/2022   Family history of pancreatic cancer 05/15/2022   Family history of prostate cancer 05/15/2022   GERD (gastroesophageal reflux disease)    History of hiatal hernia    patient believes she may have been told she has this years ago    SURGICAL HISTORY: Past Surgical History:  Procedure Laterality Date   BREAST BIOPSY  09/19/2022   MM LT RADIOACTIVE SEED EA ADD LESION LOC MAMMO GUIDE 09/19/2022 [facility]   BREAST BIOPSY  09/19/2022   MM LT RADIOACTIVE SEED LOC MAMMO GUIDE 09/19/2022 [facility] MAMMOGRAP[facility]   BREAST LUMPECTOMY WITH RADIOACTIVE SEED AND AXILLARY LYMPH NODE DISSECTION Left 09/20/2022   Procedure: LEFT BREAST SEED LUMPECTOMY, LEFT AXILLARY LYMPH NODE DISSECTION;  Surgeon: Harriette Bouillon, MD;  Location: [facility] OR;  Service: General;  Laterality: Left;  GEN & PEC BLOCK   CERVIX SURGERY     Laser removal of pre-cancer cells   LASIK Bilateral    PORTACATH PLACEMENT Right 04/20/2022   Procedure: PORT PLACEMENT;  Surgeon: Harriette Bouillon, MD;  Location: [facility] OR;   Service: General;  Laterality: Right;    SOCIAL HISTORY: Social History   Socioeconomic History   Marital status: Significant Other    Spouse name: [name]   Number of children: 1   Years of education: Not on file   Highest education level: Not on file  Occupational History   Not on file  Tobacco Use   Smoking status: Former    Packs/day: 1.00    Years: 20.00    Additional pack years: 0.00    Total pack years: 20.00    Types: Cigarettes   Smokeless tobacco: Never  Vaping Use   Vaping Use: Never used  Substance and Sexual Activity   Alcohol use: Not Currently   Drug use: Never   Sexual activity: Not on file  Other Topics Concern   Not on file  Social History Narrative   Not on file   Social Determinants of Health   Financial Resource Strain: Low Risk  (04/04/2022)   Overall Financial Resource Strain (CARDIA)    Difficulty of Paying Living Expenses: Not very hard  Food Insecurity: No Food  Insecurity (04/04/2022)   Hunger Vital Sign    Worried About Running Out of Food in the Last Year: Never true    Ran Out of Food in the Last Year: Never true  Transportation Needs: No Transportation Needs (04/04/2022)   PRAPARE - Administrator, Civil Service (Medical): No    Lack of Transportation (Non-Medical): No  Physical Activity: Not on file  Stress: Not on file  Social Connections: Not on file  Intimate Partner Violence: Not At Risk (04/11/2022)   Humiliation, Afraid, Rape, and Kick questionnaire    Fear of Current or Ex-Partner: No    Emotionally Abused: No    Physically Abused: No    Sexually Abused: No    FAMILY HISTORY: Family History  Problem Relation Age of Onset   Prostate cancer Father        dx after 75   Breast cancer Sister 40       neg GT   Prostate cancer Maternal Grandfather        d. late 35s   Pancreatic cancer Paternal Grandmother        d. 52s   Parkinson's disease Paternal Grandmother    Bladder Cancer Paternal Grandfather        dx  after 53   Breast cancer Cousin        paternal female cousin; dx 50s    Review of Systems  Constitutional:  Positive for fatigue. Negative for appetite change, chills, fever and unexpected weight change.  HENT:   Negative for hearing loss, lump/mass and trouble swallowing.   Eyes:  Negative for eye problems and icterus.  Respiratory:  Negative for chest tightness, cough and shortness of breath.   Cardiovascular:  Negative for chest pain, leg swelling and palpitations.  Gastrointestinal:  Negative for abdominal distention, abdominal pain, constipation, diarrhea, nausea and vomiting.  Endocrine: Negative for hot flashes.  Genitourinary:  Negative for difficulty urinating.   Musculoskeletal:  Negative for arthralgias.  Skin:  Negative for itching and rash.  Neurological:  Negative for dizziness, extremity weakness, headaches and numbness.  Hematological:  Negative for adenopathy. Does not bruise/bleed easily.  Psychiatric/Behavioral:  Negative for depression. The patient is not nervous/anxious.       PHYSICAL EXAMINATION   Onc Performance Status - 03/09/23 0847       ECOG Perf Status   ECOG Perf Status Fully active, able to carry on all pre-disease performance without restriction      KPS SCALE   KPS % SCORE Able to carry on normal activity, minor s/s of disease             Vitals:   03/09/23 0833  BP: 135/68  Pulse: 65  Resp: 16  Temp: 97.9 F (36.6 C)  SpO2: 100%    Physical Exam Constitutional:      General: She is not in acute distress.    Appearance: Normal appearance. She is not toxic-appearing.  HENT:     Head: Normocephalic and atraumatic.  Eyes:     General: No scleral icterus. Cardiovascular:     Rate and Rhythm: Normal rate and regular rhythm.     Pulses: Normal pulses.     Heart sounds: Normal heart sounds.  Pulmonary:     Effort: Pulmonary effort is normal.     Breath sounds: Normal breath sounds.  Abdominal:     General: Abdomen is flat.  Bowel sounds are normal. There is no distension.     Palpations: Abdomen is  soft.     Tenderness: There is no abdominal tenderness.  Musculoskeletal:        General: No swelling.     Cervical back: Neck supple.  Lymphadenopathy:     Cervical: No cervical adenopathy.  Skin:    General: Skin is warm and dry.     Findings: No rash.  Neurological:     General: No focal deficit present.     Mental Status: She is alert.  Psychiatric:        Mood and Affect: Mood normal.        Behavior: Behavior normal.     LABORATORY DATA:  CBC    Component Value Date/Time   WBC 2.9 (L) 03/09/2023 0821   WBC 3.7 (L) 09/13/2022 0854   RBC 4.60 03/09/2023 0821   HGB 12.9 03/09/2023 0821   HCT 38.4 03/09/2023 0821   PLT 198 03/09/2023 0821   MCV 83.5 03/09/2023 0821   MCH 28.0 03/09/2023 0821   MCHC 33.6 03/09/2023 0821   RDW 15.0 03/09/2023 0821   LYMPHSABS 0.8 03/09/2023 0821   MONOABS 0.4 03/09/2023 0821   EOSABS 0.1 03/09/2023 0821   BASOSABS 0.0 03/09/2023 0821    CMP     Component Value Date/Time   NA 134 (L) 02/16/2023 0903   K 3.6 02/16/2023 0903   CL 102 02/16/2023 0903   CO2 26 02/16/2023 0903   GLUCOSE 95 02/16/2023 0903   BUN 8 02/16/2023 0903   CREATININE 0.52 02/16/2023 0903   CALCIUM 9.2 02/16/2023 0903   PROT 6.9 02/16/2023 0903   ALBUMIN 4.1 02/16/2023 0903   AST 49 (H) 02/16/2023 0903   ALT 49 (H) 02/16/2023 0903   ALKPHOS 56 02/16/2023 0903   BILITOT 0.7 02/16/2023 0903   GFRNONAA >60 02/16/2023 0903   GFRAA  03/07/2011 1741    >60        The eGFR has been calculated using the MDRD equation. This calculation has not been validated in all clinical situations. eGFR's persistently <60 mL/min signify possible Chronic Kidney Disease.        ASSESSMENT and THERAPY PLAN:   Malignant neoplasm of upper-outer quadrant of left breast in female, estrogen receptor positive (HCC) Caitlyn Torres is a 62 year old woman with left breast stage IIa ER positive, HER2  positive invasive ductal carcinoma diagnosed in May 2023, status post neoadjuvant Herceptin Perjeta and letrozole x 8 cycles, left lumpectomy and axillary node dissection, adjuvant radiation, adjuvant Kadcyla, and adjuvant antiestrogen therapy with anastrozole which began in April 2024.  Treatment plan based on multidisciplinary tumor board: 1. Neoadjuvant chemotherapy with Herceptin Perjeta and letrozole for 8 cycles. (Declined TCHP) 2. left lumpectomy and ALND  3. Followed by adjuvant radiation  4.  Adjuvant antiestrogen therapy with neratinib  ----------------------------------------------------------------------------------  Kadcyla Toxicities:  Fatigue: managing with energy conservation At risk for heart failure: Her most recent echo was completed 11/2022 and was normal.  Repeat echo ordered.  Leukopenia: mild, monitoring  Labs reviewed CBC is stable ANC 1.5.  C-Met is pending.  She will proceed with treatment today so long as her c-Met is within parameters.  Anastrozole Toxicities:  Fatigue: improving with time, managed with energy conservation Bone Health: bone density testing ordered  Other concerns:  Breast Lymphedema: following with PT Recent URI-no sign of active infection, improving Vaginal Dryness: managed with OTC remedies, I recommended Vitamin E suppositories and a pH balanced feminine cleanser.   This will return in 3 weeks for labs, follow-up, and her next infusion.  All questions were answered. The patient knows to call the clinic with any problems, questions or concerns. We can certainly see the patient much sooner if necessary.  Total encounter time:30 minutes*in face-to-face visit time, chart review, lab review, care coordination, order entry, and documentation of the encounter time.    Lillard Anes, NP 03/09/23 9:20 AM Medical Oncology and Hematology [facility] [address] [place], [place] [address] Tel. [phone]    Fax.  [phone]  *Total Encounter Time as defined by the Centers for Medicare and Medicaid Services includes, in addition to the face-to-face time of a patient visit (documented in the note above) non-face-to-face time: obtaining and reviewing outside history, ordering and reviewing medications, tests or procedures, care coordination (communications with other health care professionals or caregivers) and documentation in the medical record.

## 2023-03-09 NOTE — Assessment & Plan Note (Addendum)
Caitlyn Torres is a 62 year old woman with left breast stage IIa ER positive, 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  Treatment plan based on multidisciplinary tumor board: 1. Neoadjuvant chemotherapy with Herceptin Perjeta and letrozole for 8 cycles. (Declined TCHP) 2. left lumpectomy and ALND  3. Followed by adjuvant radiation  4.  Adjuvant antiestrogen therapy with neratinib  ----------------------------------------------------------------------------------  Kadcyla Toxicities:  Fatigue: managing with energy conservation At risk for heart failure: Her most recent echo was completed 11/2022 and was normal.  Repeat echo ordered.  Leukopenia: mild, monitoring  Labs reviewed CBC is stable ANC 1.5.  C-Met is pending.  She will proceed with treatment today so long as her c-Met is within parameters.  Anastrozole Toxicities:  Fatigue: improving with time, managed with energy conservation Bone Health: bone density testing ordered  Other concerns:  Breast Lymphedema: following with PT Recent URI-no sign of active infection, improving Vaginal Dryness: managed with OTC remedies, I recommended Vitamin E suppositories and a pH balanced feminine cleanser.   This will return in 3 weeks for labs, follow-up, and her next infusion.

## 2023-03-13 ENCOUNTER — Ambulatory Visit: Payer: BC Managed Care – PPO

## 2023-03-13 DIAGNOSIS — M25612 Stiffness of left shoulder, not elsewhere classified: Secondary | ICD-10-CM

## 2023-03-13 DIAGNOSIS — C50412 Malignant neoplasm of upper-outer quadrant of left female breast: Secondary | ICD-10-CM

## 2023-03-13 DIAGNOSIS — Z483 Aftercare following surgery for neoplasm: Secondary | ICD-10-CM

## 2023-03-13 DIAGNOSIS — R293 Abnormal posture: Secondary | ICD-10-CM

## 2023-03-13 DIAGNOSIS — I890 Lymphedema, not elsewhere classified: Secondary | ICD-10-CM

## 2023-03-13 NOTE — Therapy (Signed)
OUTPATIENT PHYSICAL THERAPY  UPPER EXTREMITY ONCOLOGY EVALUATION  Patient Name: Caitlyn Torres MRN: [id number] DOB:08/08/1961, 62 y.o., female Today's Date: 03/13/2023  END OF SESSION:  PT End of Session - 03/13/23 1505     Visit Number 6    Number of Visits 12    Date for PT Re-Evaluation 04/04/23    PT Start Time 1506    PT Stop Time 1556    PT Time Calculation (min) 50 min    Activity Tolerance Patient tolerated treatment well    Behavior During Therapy [facility] for tasks assessed/performed             Past Medical History:  Diagnosis Date   Anxiety    Arthritis    Cancer (HCC) 03/2022   L breast   Depression    Family history of breast cancer 05/15/2022   Family history of pancreatic cancer 05/15/2022   Family history of prostate cancer 05/15/2022   GERD (gastroesophageal reflux disease)    History of hiatal hernia    patient believes she may have been told she has this years ago   Past Surgical History:  Procedure Laterality Date   BREAST BIOPSY  09/19/2022   MM LT RADIOACTIVE SEED EA ADD LESION LOC MAMMO GUIDE 09/19/2022 [facility]   BREAST BIOPSY  09/19/2022   MM LT RADIOACTIVE SEED LOC MAMMO GUIDE 09/19/2022 [facility]   BREAST LUMPECTOMY WITH RADIOACTIVE SEED AND AXILLARY LYMPH NODE DISSECTION Left 09/20/2022   Procedure: LEFT BREAST SEED LUMPECTOMY, LEFT AXILLARY LYMPH NODE DISSECTION;  Surgeon: Harriette Bouillon, MD;  Location: [facility] OR;  Service: General;  Laterality: Left;  GEN & PEC BLOCK   CERVIX SURGERY     Laser removal of pre-cancer cells   LASIK Bilateral    PORTACATH PLACEMENT Right 04/20/2022   Procedure: PORT PLACEMENT;  Surgeon: Harriette Bouillon, MD;  Location: [facility] OR;  Service: General;  Laterality: Right;   Patient Active Problem List   Diagnosis Date Noted   Other fatigue 08/28/2022   Acute pain 08/28/2022   Genetic testing 06/01/2022   Family history of breast cancer 05/15/2022   Family history of pancreatic cancer 05/15/2022    Family history of prostate cancer 05/15/2022   Port-A-Cath in place 04/28/2022   Malignant neoplasm of upper-outer quadrant of left breast in female, estrogen receptor positive (HCC) 03/29/2022      REFERRING PROVIDER: [name], MD  REFERRING DIAG: Left breast swelling/tenderness  THERAPY DIAG:  Malignant neoplasm of upper-outer quadrant of left female breast, unspecified estrogen receptor status (HCC)  Stiffness of left shoulder, not elsewhere classified  Lymphedema of breast  Aftercare following surgery for neoplasm  Abnormal posture  ONSET DATE: Early March  Rationale for Evaluation and Treatment: Rehabilitation  SUBJECTIVE:  SUBJECTIVE STATEMENT:  Under my left breast has been much better than it was last week.  I am not tender like I was .I am not coughing as much. I have stretched a lot today so cording doesn/t feel as bad. Breast is still very swollen. It doesn't seem to change much even with the compression bra day and night.  PERTINENT HISTORY:  03/14/2022:Screening detected left breast masses 1.5 cm and 1.3 cm with enlarged left axillary lymph node, biopsy revealed grade 3 IDC with lymph node being positive, ER 95%, PR 0%, HER2 2+ by IHC and FISH positive with a ratio 2.45 and a copy #4.9, the second biopsy was HER2 negative with a ratio  1. Neoadjuvant chemotherapy with Herceptin Perjeta and letrozole for 8 cycles.  (Our original recommendation is TCHP x6 cycles but patient is not willing to go through chemo) 2. left lumpectomy and ALND 09/20/2022: 0.6 cm IDC 2/14 lymph nodes, margins negative, ER 95%, PR 0%, HER2 positive 3. Followed by adjuvant radiation therapy  4.  Adjuvant antiestrogen therapy with neratinib She had left lumpectomy on 09/20/2022 with 2/14 LN's    PAIN:  Are  you having pain? Yes NPRS scale:0/10 at rest, 3/10 with movements left upper chest and lateral trunk,  breast tender Pain location: LEFT Pain orientation: Left  PAIN TYPE: tight and sore Pain description: constant  Aggravating factors: being hugged, reaching, lying on left side Relieving factors: Compression?  PRECAUTIONS: Left UE lymphedema precautions  WEIGHT BEARING RESTRICTIONS: No  FALLS:  Has patient fallen in last 6 months? 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  SENSATION: Light touch: Deficits     POSTURE: forward head, rounded shoulders  UPPER EXTREMITY AROM/PROM:  A/PROM RIGHT   eval   Shoulder extension 42  Shoulder flexion 163  Shoulder abduction 179  Shoulder internal rotation 58  Shoulder external rotation 88    (Blank rows = not tested)  A/PROM LEFT   eval LEFT 03/13/2023  Shoulder extension 37   Shoulder flexion 144 159  Shoulder abduction 140 168  Shoulder internal rotation 56   Shoulder external rotation 84     (Blank rows = not tested)  CERVICAL AROM: All within normal limits:     UPPER EXTREMITY STRENGTH:   LYMPHEDEMA ASSESSMENTS:   SURGERY TYPE/DATE: Left lumpectomy,09/20/2022  NUMBER OF LYMPH NODES REMOVED: 2+/14  CHEMOTHERAPY:  No at her request, Did Herceptin, Perjeta and letrozole  RADIATION:yes but stopped early at her request  HORMONE TREATMENT: YES  INFECTIONS: NO   LYMPHEDEMA ASSESSMENTS:   LANDMARK RIGHT  eval  At axilla    15 cm proximal to olecranon process   10 cm proximal to olecranon process   Olecranon process   15 cm proximal to ulnar styloid process   10 cm proximal to ulnar styloid process   Just proximal to ulnar styloid process   Across  hand at thumb web space   At base of 2nd digit   (Blank rows = not tested)  LANDMARK LEFT  eval  At axilla    15 cm proximal to olecranon process   10 cm proximal to olecranon process   Olecranon process   15 cm proximal to ulnar styloid process   10 cm proximal to ulnar styloid process   Just proximal to ulnar styloid process   Across hand at thumb web space   At base of 2nd digit   (Blank rows = not tested)    L-DEX LYMPHEDEMA SCREENING: The patient was assessed using the L-Dex machine today to produce a lymphedema index baseline score. The patient will be reassessed on a regular basis (typically every 3 months) to obtain new L-Dex scores. If the score is > 6.5 points away from his/her baseline score indicating onset of subclinical lymphedema, it will be recommended to wear a compression garment for 4 weeks, 12 hours per day and then be reassessed. If the score continues to be > 6.5 points from baseline at reassessment, we will initiate lymphedema treatment. Assessing in this manner has a 95% rate of preventing clinically significant lymphedema.   BREAST COMPLAINTS QUESTIONNAIRE Pain:8 Heaviness:8 Swollen feeling:9 Tense Skin:8 Redness:6 Bra Print:9 Size of Pores:5 Hard feeling: 6 Total:   59  /80 A Score over 9 indicates lymphedema issues in the breast   TODAY'S TREATMENT:                                                                                                                                          DATE:  Pt permission  and consent throughout each step of examination and treatment with modification and draping if requested when working on sensitive areas  03/13/2023 STM left UT, pectorals, lateral trunk and left lower rib cage with cocoa butter, and SL to left scapular region with STM and cupping PROM left shoulder flexion, scaption, abduction MFR to left axilla n supine: PTperformed with instruction to pt.Short neck, 5 diaphragmatic breaths, R axillary nodes and establishment of interaxillary pathway, L inguinal nodes and establishment of axilloinguinal pathway, then L breast moving fluid towards pathways spending extra time in any areas of fibrosis then retracing all steps. Measured Left AROM  03/08/2023  STM left UT, pectorals, lateral trunk and left lower rib cage with cocoa butter, and SL to left scapular region with STM and cupping Supine wand flexion and scaption x 5 PROM left shoulder flexion, scaption, abduction MFR to left axilla and left upper arm and forearm area of cording Gray foam pad in TG soft made to pad rib area from bra 03/05/2023 STM left UT, pectorals, lateral trunk and in SL to left scapular area and lats with cocoa butter Supine wand flexion and scaption x 5 PROM left shoulder flexion, scaption, abduction MFR to left axilla and left upper arm and forearm area  of cording n supine: PTperformed with instruction to pt.Short neck, 5 diaphragmatic breaths, R axillary nodes and establishment of interaxillary pathway, L inguinal nodes and establishment of axilloinguinal pathway, then L breast moving fluid towards pathways spending extra time in any areas of fibrosis then retracing all steps.    03/02/2023 Peach dot pad made to put in lower breast area of enlarged pores In supine: PTperformed with instruction to pt.Short neck, 5 diaphragmatic breaths, R axillary nodes and establishment of interaxillary pathway, L inguinal nodes and establishment of axilloinguinal pathway, then L breast moving  fluid towards pathways spending extra time in any areas of fibrosis then retracing all steps. Pt then performed all steps with therapist observing and assisting with VC's and TC's prn Assessed left upper arm/forearm;several small cords felt. Therapist performed MFR to forearm and upper arm. Showed pt how to do NTS on herself.   02/27/2023 STM left UT, pectorals, lateral trunk and in SL to left scapular area and lats with cocoa butter Supine wand flexion and scaption x 5 PROM left shoulder flexion, scaption, abduction MFR to left axilla LTR x 5 with arms outstretched Scar massage left lateral breast. Doorway pectoral stretch x 3, wall stretch b x 3 Standing lat stretch x 2     PATIENT EDUCATION: 02/20/2022 Education details: Discussed POC, Left UQ tightness and limitations in ROM, breast swelling, compression bra, MLD Person educated: Patient Education method: Explanation Education comprehension: verbalized understanding  HOME EXERCISE PROGRAM: None given; will resume 4 post of exercises and hold on band exercises right now.  ASSESSMENT:  CLINICAL IMPRESSION: Pain in left rib cage area greatly improved. Left shoulder flexion improved 15 degrees and abduction improved 18 degrees since evaluation. Significant tightness still noted in clavicular and axillary border of pectoralis major.  Breast swelling continues    OBJECTIVE IMPAIRMENTS: decreased activity tolerance, decreased knowledge of condition, decreased ROM, increased edema, increased fascial restrictions, impaired UE functional use, postural dysfunction, and pain.   ACTIVITY LIMITATIONS: sleeping and reach over head  PARTICIPATION LIMITATIONS: interpersonal relationship  PERSONAL FACTORS: 3+ comorbidities: Left breast cancer s/p infusions and radiation  are also affecting patient's functional outcome.   REHAB POTENTIAL: Excellent  CLINICAL DECISION MAKING: Stable/uncomplicated  EVALUATION COMPLEXITY:  Low  GOALS: Goals reviewed with patient? Yes  SHORT TERM GOALS= LONG TERM GOALS: Target date: 04/04/2023  Pt will be independent in MLD for reducing left breast edema Baseline: Goal status: INITIAL  2.  Pt will note decreased breast swelling/tenderness by atleast 50% Baseline:  Goal status: INITIAL  3.  Pt will be able to lie on her left side to sleep for short periods of time Baseline:  Goal status: INITIAL  4.  Pt will have left shoulder ROM WNL without increased pain with reaching Baseline:  Goal status: INITIAL  5.  Breast complaints survey will be reduced to no greater than 9 Baseline:  Goal status: INITIAL PLAN:  PT FREQUENCY: 2x/week  PT DURATION: 6 weeks  PLANNED INTERVENTIONS: Therapeutic exercises, Therapeutic activity, Patient/Family education, Self Care, Joint mobilization, Orthotic/Fit training, Manual lymph drainage, scar mobilization, Vasopneumatic device, Manual therapy, and Re-evaluation  PLAN FOR NEXT SESSION: how was peach foam for bra?, review L. breast MLD,  MFR to cording,STM to left UQ, PROM left shoulder, LTR with arms outstretched, wand exs, MLD to left breast and instruct pt., update HEP prn.  Waynette Buttery, PT 03/13/2023, 4:00 PM

## 2023-03-15 ENCOUNTER — Ambulatory Visit: Payer: BC Managed Care – PPO | Attending: Hematology and Oncology

## 2023-03-15 DIAGNOSIS — R293 Abnormal posture: Secondary | ICD-10-CM | POA: Diagnosis present

## 2023-03-15 DIAGNOSIS — I890 Lymphedema, not elsewhere classified: Secondary | ICD-10-CM

## 2023-03-15 DIAGNOSIS — Z483 Aftercare following surgery for neoplasm: Secondary | ICD-10-CM | POA: Diagnosis present

## 2023-03-15 DIAGNOSIS — C50412 Malignant neoplasm of upper-outer quadrant of left female breast: Secondary | ICD-10-CM | POA: Insufficient documentation

## 2023-03-15 DIAGNOSIS — M25612 Stiffness of left shoulder, not elsewhere classified: Secondary | ICD-10-CM

## 2023-03-15 NOTE — Therapy (Signed)
OUTPATIENT PHYSICAL THERAPY  UPPER EXTREMITY ONCOLOGY EVALUATION  Patient Name: EUPHEMIA LINGERFELT MRN: [id number] DOB:1961/03/29, 62 y.o., female Today's Date: 03/15/2023  END OF SESSION:  PT End of Session - 03/15/23 0806     Visit Number 7    Number of Visits 12    Date for PT Re-Evaluation 04/04/23    PT Start Time 0806    PT Stop Time 0856    PT Time Calculation (min) 50 min    Activity Tolerance Patient tolerated treatment well    Behavior During Therapy [facility] for tasks assessed/performed             Past Medical History:  Diagnosis Date   Anxiety    Arthritis    Cancer (HCC) 03/2022   L breast   Depression    Family history of breast cancer 05/15/2022   Family history of pancreatic cancer 05/15/2022   Family history of prostate cancer 05/15/2022   GERD (gastroesophageal reflux disease)    History of hiatal hernia    patient believes she may have been told she has this years ago   Past Surgical History:  Procedure Laterality Date   BREAST BIOPSY  09/19/2022   MM LT RADIOACTIVE SEED EA ADD LESION LOC MAMMO GUIDE 09/19/2022 [facility]   BREAST BIOPSY  09/19/2022   MM LT RADIOACTIVE SEED LOC MAMMO GUIDE 09/19/2022 [facility]   BREAST LUMPECTOMY WITH RADIOACTIVE SEED AND AXILLARY LYMPH NODE DISSECTION Left 09/20/2022   Procedure: LEFT BREAST SEED LUMPECTOMY, LEFT AXILLARY LYMPH NODE DISSECTION;  Surgeon: Harriette Bouillon, MD;  Location: [facility] OR;  Service: General;  Laterality: Left;  GEN & PEC BLOCK   CERVIX SURGERY     Laser removal of pre-cancer cells   LASIK Bilateral    PORTACATH PLACEMENT Right 04/20/2022   Procedure: PORT PLACEMENT;  Surgeon: Harriette Bouillon, MD;  Location: [facility] OR;  Service: General;  Laterality: Right;   Patient Active Problem List   Diagnosis Date Noted   Other fatigue 08/28/2022   Acute pain 08/28/2022   Genetic testing 06/01/2022   Family history of breast cancer 05/15/2022   Family history of pancreatic cancer 05/15/2022    Family history of prostate cancer 05/15/2022   Port-A-Cath in place 04/28/2022   Malignant neoplasm of upper-outer quadrant of left breast in female, estrogen receptor positive (HCC) 03/29/2022      REFERRING PROVIDER: [name], MD  REFERRING DIAG: Left breast swelling/tenderness  THERAPY DIAG:  Malignant neoplasm of upper-outer quadrant of left female breast, unspecified estrogen receptor status (HCC)  Stiffness of left shoulder, not elsewhere classified  Lymphedema of breast  Aftercare following surgery for neoplasm  Abnormal posture  ONSET DATE: Early March  Rationale for Evaluation and Treatment: Rehabilitation  SUBJECTIVE:  SUBJECTIVE STATEMENT:  Left lower ribs pretty good still. I have done some stretches already this am. I didn't do MLD yesterday.  PERTINENT HISTORY:  03/14/2022:Screening detected left breast masses 1.5 cm and 1.3 cm with enlarged left axillary lymph node, biopsy revealed grade 3 IDC with lymph node being positive, ER 95%, PR 0%, HER2 2+ by IHC and FISH positive with a ratio 2.45 and a copy #4.9, the second biopsy was HER2 negative with a ratio  1. Neoadjuvant chemotherapy with Herceptin Perjeta and letrozole for 8 cycles.  (Our original recommendation is TCHP x6 cycles but patient is not willing to go through chemo) 2. left lumpectomy and ALND 09/20/2022: 0.6 cm IDC 2/14 lymph nodes, margins negative, ER 95%, PR 0%, HER2 positive 3. Followed by adjuvant radiation therapy  4.  Adjuvant antiestrogen therapy with neratinib She had left lumpectomy on 09/20/2022 with 2/14 LN's    PAIN:  Are you having pain? Yes NPRS scale:0/10 at rest, 4/10 with movements left upper chest and lateral trunk,  breast tender Pain location: LEFT Pain orientation: Left  PAIN TYPE: tight  and sore Pain description: constant  Aggravating factors: being hugged, reaching, lying on left side Relieving factors: Compression?  PRECAUTIONS: Left UE lymphedema precautions  WEIGHT BEARING RESTRICTIONS: No  FALLS:  Has patient fallen in last 6 months? 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  SENSATION: Light touch: Deficits     POSTURE: forward head, rounded shoulders  UPPER EXTREMITY AROM/PROM:  A/PROM RIGHT   eval   Shoulder extension 42  Shoulder flexion 163  Shoulder abduction 179  Shoulder internal rotation 58  Shoulder external rotation 88    (Blank rows = not tested)  A/PROM LEFT   eval LEFT 03/13/2023  Shoulder extension 37   Shoulder flexion 144 159  Shoulder abduction 140 168  Shoulder internal rotation 56   Shoulder external rotation 84     (Blank rows = not tested)  CERVICAL AROM: All within normal limits:     UPPER EXTREMITY STRENGTH:   LYMPHEDEMA ASSESSMENTS:   SURGERY TYPE/DATE: Left lumpectomy,09/20/2022  NUMBER OF LYMPH NODES REMOVED: 2+/14  CHEMOTHERAPY: No at her request, Did Herceptin, Perjeta and letrozole  RADIATION:yes but stopped early at her request  HORMONE TREATMENT: YES  INFECTIONS: NO   LYMPHEDEMA ASSESSMENTS:    LANDMARK RIGHT  eval  At axilla    15 cm proximal to olecranon process   10 cm proximal to olecranon process   Olecranon process   15 cm proximal to ulnar styloid process   10 cm proximal to ulnar styloid process   Just proximal to ulnar styloid process   Across hand at thumb web space   At base of 2nd digit   (Blank rows = not tested)  LANDMARK LEFT  eval  At axilla    15 cm proximal to olecranon process   10  cm proximal to olecranon process   Olecranon process   15 cm proximal to ulnar styloid process   10 cm proximal to ulnar styloid process   Just proximal to ulnar styloid process   Across hand at thumb web space   At base of 2nd digit   (Blank rows = not tested)    L-DEX LYMPHEDEMA SCREENING: The patient was assessed using the L-Dex machine today to produce a lymphedema index baseline score. The patient will be reassessed on a regular basis (typically every 3 months) to obtain new L-Dex scores. If the score is > 6.5 points away from his/her baseline score indicating onset of subclinical lymphedema, it will be recommended to wear a compression garment for 4 weeks, 12 hours per day and then be reassessed. If the score continues to be > 6.5 points from baseline at reassessment, we will initiate lymphedema treatment. Assessing in this manner has a 95% rate of preventing clinically significant lymphedema.   BREAST COMPLAINTS QUESTIONNAIRE Pain:8 Heaviness:8 Swollen feeling:9 Tense Skin:8 Redness:6 Bra Print:9 Size of Pores:5 Hard feeling: 6 Total:   59  /80 A Score over 9 indicates lymphedema issues in the breast   TODAY'S TREATMENT:                                                                                                                                          DATE:  Pt permission and consent throughout each step of examination and treatment with modification and draping if requested when working on sensitive areas  03/15/2023 STM left UT, pectorals,  lateral trunk with cocoa butter, and SL to left scapular region with STM and cupping Supine wand x 5 ea PROM left shoulder flexion, scaption, abduction MFR to left axilla and upper arm area of cording in D2 fleion Lower trunk rotation x 5 arms outstretched and goal post arms Supine yellow band flexion, horizontal abduction x 5-7 reps Standing lat stretch x 3 Pt advised to place foam pad in bra during the day     03/13/2023 STM left UT, pectorals, lateral trunk and left lower rib cage with cocoa butter, and SL to left scapular region with STM and cupping PROM left shoulder flexion, scaption, abduction MFR to left axilla n supine: PTperformed with instruction to pt.Short neck, 5 diaphragmatic breaths, R axillary nodes and establishment of interaxillary pathway, L inguinal nodes and establishment of axilloinguinal pathway, then L breast moving fluid towards pathways spending extra time in any areas of fibrosis then retracing all steps. Measured Left AROM  03/08/2023  STM left UT, pectorals, lateral trunk and left lower rib cage with cocoa butter, and SL to left scapular region with STM and cupping Supine wand flexion and scaption x 5 PROM left shoulder flexion, scaption, abduction MFR to left axilla and left upper arm and forearm area of cording Gray foam pad in TG soft made to pad rib area  from bra 03/05/2023 STM left UT, pectorals, lateral trunk and in SL to left scapular area and lats with cocoa butter Supine wand flexion and scaption x 5 PROM left shoulder flexion, scaption, abduction MFR to left axilla and left upper arm and forearm area of cording n supine: PTperformed with instruction to pt.Short neck, 5 diaphragmatic breaths, R axillary nodes and establishment of interaxillary pathway, L inguinal nodes and establishment of axilloinguinal pathway, then L breast moving fluid towards pathways spending extra time in any areas of fibrosis then retracing all steps.    03/02/2023 [place]  dot pad made to put in lower breast area of enlarged pores In supine: PTperformed with instruction to pt.Short neck, 5 diaphragmatic breaths, R axillary nodes and establishment of interaxillary pathway, L inguinal nodes and establishment of axilloinguinal pathway, then L breast moving fluid towards pathways spending extra time in any areas of fibrosis then retracing all steps. Pt then performed all steps with therapist observing and assisting with VC's and TC's prn Assessed left upper arm/forearm;several small cords felt. Therapist performed MFR to forearm and upper arm. Showed pt how to do NTS on herself.   02/27/2023 STM left UT, pectorals, lateral trunk and in SL to left scapular area and lats with cocoa butter Supine wand flexion and scaption x 5 PROM left shoulder flexion, scaption, abduction MFR to left axilla LTR x 5 with arms outstretched Scar massage left lateral breast. Doorway pectoral stretch x 3, wall stretch b x 3 Standing lat stretch x 2     PATIENT EDUCATION: 02/20/2022 Education details: Discussed POC, Left UQ tightness and limitations in ROM, breast swelling, compression bra, MLD Person educated: Patient Education method: Explanation Education comprehension: verbalized understanding  HOME EXERCISE PROGRAM: None given; will resume 4 post of exercises and hold on band exercises right now.  ASSESSMENT:  CLINICAL IMPRESSION:  Significant tightness still noted in clavicular and axillary border of pectoralis major, and pec minor.  Serratus with shortened areas as well.ER slightly limited today.    OBJECTIVE IMPAIRMENTS: decreased activity tolerance, decreased knowledge of condition, decreased ROM, increased edema, increased fascial restrictions, impaired UE functional use, postural dysfunction, and pain.   ACTIVITY LIMITATIONS: sleeping and reach over head  PARTICIPATION LIMITATIONS: interpersonal relationship  PERSONAL FACTORS: 3+ comorbidities: Left breast cancer  s/p infusions and radiation  are also affecting patient's functional outcome.   REHAB POTENTIAL: Excellent  CLINICAL DECISION MAKING: Stable/uncomplicated  EVALUATION COMPLEXITY: Low  GOALS: Goals reviewed with patient? Yes  SHORT TERM GOALS= LONG TERM GOALS: Target date: 04/04/2023  Pt will be independent in MLD for reducing left breast edema Baseline: Goal status: INITIAL  2.  Pt will note decreased breast swelling/tenderness by atleast 50% Baseline:  Goal status: INITIAL  3.  Pt will be able to lie on her left side to sleep for short periods of time Baseline:  Goal status: INITIAL  4.  Pt will have left shoulder ROM WNL without increased pain with reaching Baseline:  Goal status: INITIAL  5.  Breast complaints survey will be reduced to no greater than 9 Baseline:  Goal status: INITIAL PLAN:  PT FREQUENCY: 2x/week  PT DURATION: 6 weeks  PLANNED INTERVENTIONS: Therapeutic exercises, Therapeutic activity, Patient/Family education, Self Care, Joint mobilization, Orthotic/Fit training, Manual lymph drainage, scar mobilization, Vasopneumatic device, Manual therapy, and Re-evaluation  PLAN FOR NEXT SESSION: how was peach foam for bra?, review L. breast MLD,  MFR to cording,STM to left UQ, PROM left shoulder, LTR with arms outstretched, wand exs, MLD to  left breast and instruct pt., update HEP prn.  Waynette Buttery, PT 03/15/2023, 8:58 AM

## 2023-03-20 ENCOUNTER — Telehealth: Payer: Self-pay | Admitting: Hematology and Oncology

## 2023-03-20 ENCOUNTER — Ambulatory Visit: Payer: BC Managed Care – PPO

## 2023-03-20 DIAGNOSIS — C50412 Malignant neoplasm of upper-outer quadrant of left female breast: Secondary | ICD-10-CM

## 2023-03-20 DIAGNOSIS — Z483 Aftercare following surgery for neoplasm: Secondary | ICD-10-CM

## 2023-03-20 DIAGNOSIS — M25612 Stiffness of left shoulder, not elsewhere classified: Secondary | ICD-10-CM

## 2023-03-20 DIAGNOSIS — R293 Abnormal posture: Secondary | ICD-10-CM

## 2023-03-20 DIAGNOSIS — I890 Lymphedema, not elsewhere classified: Secondary | ICD-10-CM

## 2023-03-20 NOTE — Therapy (Signed)
OUTPATIENT PHYSICAL THERAPY  UPPER EXTREMITY ONCOLOGY EVALUATION  Patient Name: Caitlyn Torres MRN: [id number] DOB:Apr 29, 1961, 62 y.o., female Today's Date: 03/20/2023  END OF SESSION:  PT End of Session - 03/20/23 1000     Visit Number 8    Number of Visits 12    Date for PT Re-Evaluation 04/04/23    PT Start Time 1002    PT Stop Time 1055    PT Time Calculation (min) 53 min    Activity Tolerance Patient tolerated treatment well    Behavior During Therapy [employer] [employer] for tasks assessed/performed             Past Medical History:  Diagnosis Date   Anxiety    Arthritis    Cancer (HCC) 03/2022   L breast   Depression    Family history of breast cancer 05/15/2022   Family history of pancreatic cancer 05/15/2022   Family history of prostate cancer 05/15/2022   GERD (gastroesophageal reflux disease)    History of hiatal hernia    patient believes she may have been told she has this years ago   Past Surgical History:  Procedure Laterality Date   BREAST BIOPSY  09/19/2022   MM LT RADIOACTIVE SEED EA ADD LESION LOC MAMMO GUIDE 09/19/2022 [facility]   BREAST BIOPSY  09/19/2022   MM LT RADIOACTIVE SEED LOC MAMMO GUIDE 09/19/2022 [facility]   BREAST LUMPECTOMY WITH RADIOACTIVE SEED AND AXILLARY LYMPH NODE DISSECTION Left 09/20/2022   Procedure: LEFT BREAST SEED LUMPECTOMY, LEFT AXILLARY LYMPH NODE DISSECTION;  Surgeon: Harriette Bouillon, MD;  Location: [facility] OR;  Service: General;  Laterality: Left;  GEN & PEC BLOCK   CERVIX SURGERY     Laser removal of pre-cancer cells   LASIK Bilateral    PORTACATH PLACEMENT Right 04/20/2022   Procedure: PORT PLACEMENT;  Surgeon: Harriette Bouillon, MD;  Location: [facility] OR;  Service: General;  Laterality: Right;   Patient Active Problem List   Diagnosis Date Noted   Other fatigue 08/28/2022   Acute pain 08/28/2022   Genetic testing 06/01/2022   Family history of breast cancer 05/15/2022   Family history of pancreatic cancer 05/15/2022    Family history of prostate cancer 05/15/2022   Port-A-Cath in place 04/28/2022   Malignant neoplasm of upper-outer quadrant of left breast in female, estrogen receptor positive (HCC) 03/29/2022      REFERRING PROVIDER: [name], MD  REFERRING DIAG: Left breast swelling/tenderness  THERAPY DIAG:  Malignant neoplasm of upper-outer quadrant of left female breast, unspecified estrogen receptor status (HCC)  Stiffness of left shoulder, not elsewhere classified  Lymphedema of breast  Aftercare following surgery for neoplasm  Abnormal posture  ONSET DATE: Early March  Rationale for Evaluation and Treatment: Rehabilitation  SUBJECTIVE:  SUBJECTIVE STATEMENT:  I had a lot of sharp pains in the side of the  left breast over the weekend. I didn't feel good this weekend. I had pain up into my neck too. I did MLD on my breast this am and the breast is better. I think sleeping on my left side on the weekend didn't help.  PERTINENT HISTORY:  03/14/2022:Screening detected left breast masses 1.5 cm and 1.3 cm with enlarged left axillary lymph node, biopsy revealed grade 3 IDC with lymph node being positive, ER 95%, PR 0%, HER2 2+ by IHC and FISH positive with a ratio 2.45 and a copy #4.9, the second biopsy was HER2 negative with a ratio  1. Neoadjuvant chemotherapy with Herceptin Perjeta and letrozole for 8 cycles.  (Our original recommendation is TCHP x6 cycles but patient is not willing to go through chemo) 2. left lumpectomy and ALND 09/20/2022: 0.6 cm IDC 2/14 lymph nodes, margins negative, ER 95%, PR 0%, HER2 positive 3. Followed by adjuvant radiation therapy  4.  Adjuvant antiestrogen therapy with neratinib She had left lumpectomy on 09/20/2022 with 2/14 LN's    PAIN:  Are you having pain? Yes, my  neck, NPRS scale:0/10 at rest, 5/10 left neck,  breast tender 2/10 Pain location: LEFT Pain orientation: Left  PAIN TYPE: tight and sore Pain description: constant  Aggravating factors: being hugged, reaching, lying on left side Relieving factors: Compression?  PRECAUTIONS: Left UE lymphedema precautions  WEIGHT BEARING RESTRICTIONS: No  FALLS:  Has patient fallen in last 6 months? 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  SENSATION: Light touch: Deficits     POSTURE: forward head, rounded shoulders  UPPER EXTREMITY AROM/PROM:  A/PROM RIGHT   eval   Shoulder extension 42  Shoulder flexion 163  Shoulder abduction 179  Shoulder internal rotation 58  Shoulder external rotation 88    (Blank rows = not tested)  A/PROM LEFT   eval LEFT 03/13/2023  Shoulder extension 37   Shoulder flexion 144 159  Shoulder abduction 140 168  Shoulder internal rotation 56   Shoulder external rotation 84     (Blank rows = not tested)  CERVICAL AROM: All within normal limits:     UPPER EXTREMITY STRENGTH:   LYMPHEDEMA ASSESSMENTS:   SURGERY TYPE/DATE: Left lumpectomy,09/20/2022  NUMBER OF LYMPH NODES REMOVED: 2+/14  CHEMOTHERAPY: No at her request, Did Herceptin, Perjeta and  letrozole  RADIATION:yes but stopped early at her request  HORMONE TREATMENT: YES  INFECTIONS: NO   LYMPHEDEMA ASSESSMENTS:   LANDMARK RIGHT  eval  At axilla    15 cm proximal to olecranon process   10 cm proximal to olecranon process   Olecranon process   15 cm proximal to ulnar styloid process   10 cm proximal to ulnar styloid process   Just proximal to ulnar styloid process   Across hand at thumb web  space   At base of 2nd digit   (Blank rows = not tested)  LANDMARK LEFT  eval  At axilla    15 cm proximal to olecranon process   10 cm proximal to olecranon process   Olecranon process   15 cm proximal to ulnar styloid process   10 cm proximal to ulnar styloid process   Just proximal to ulnar styloid process   Across hand at thumb web space   At base of 2nd digit   (Blank rows = not tested)    L-DEX LYMPHEDEMA SCREENING: The patient was assessed using the L-Dex machine today to produce a lymphedema index baseline score. The patient will be reassessed on a regular basis (typically every 3 months) to obtain new L-Dex scores. If the score is > 6.5 points away from his/her baseline score indicating onset of subclinical lymphedema, it will be recommended to wear a compression garment for 4 weeks, 12 hours per day and then be reassessed. If the score continues to be > 6.5 points from baseline at reassessment, we will initiate lymphedema treatment. Assessing in this manner has a 95% rate of preventing clinically significant lymphedema.   BREAST COMPLAINTS QUESTIONNAIRE Pain:8 Heaviness:8 Swollen feeling:9 Tense Skin:8 Redness:6 Bra Print:9 Size of Pores:5 Hard feeling: 6 Total:   59  /80 A Score over 9 indicates lymphedema issues in the breast   TODAY'S TREATMENT:                                                                                                                                          DATE:  Pt permission and consent throughout each step of  examination and treatment with modification and draping if requested when working on sensitive areas    03/20/2023 STM left UT, pectorals, lateral trunk with cocoa butter, and SL to left scapular region with STM and cupping PROM left shoulder flexion, scaption, abduction MFR to left axilla and upper arm area of cording in D2 fleion PTperformed MLD to left breast;Short neck, 5 diaphragmatic breaths, R axillary nodes and establishment of interaxillary pathway, L inguinal nodes and establishment of axilloinguinal pathway, then L breast moving fluid towards pathways spending extra time in any areas of fibrosis then retracing all steps. 03/15/2023 STM left UT, pectorals, lateral trunk with cocoa butter, and SL to left scapular region with STM and cupping Supine wand x 5 ea PROM left shoulder flexion, scaption, abduction MFR to left axilla and upper arm area of cording in D2 fleion Lower trunk rotation x 5 arms outstretched and goal post arms Supine yellow band flexion, horizontal abduction x 5-7 reps Standing lat stretch x 3 Pt advised to place foam pad in bra during the day     03/13/2023 STM left UT, pectorals, lateral trunk and left lower rib cage with cocoa butter, and SL to left scapular region with STM and cupping PROM left shoulder  flexion, scaption, abduction MFR to left axilla n supine: PTperformed with instruction to pt.Short neck, 5 diaphragmatic breaths, R axillary nodes and establishment of interaxillary pathway, L inguinal nodes and establishment of axilloinguinal pathway, then L breast moving fluid towards pathways spending extra time in any areas of fibrosis then retracing all steps. Measured Left AROM  03/08/2023  STM left UT, pectorals, lateral trunk and left lower rib cage with cocoa butter, and SL to left scapular region with STM and cupping Supine wand flexion and scaption x 5 PROM left shoulder flexion, scaption, abduction MFR to left axilla and left upper arm and forearm  area of cording Gray foam pad in TG soft made to pad rib area from bra 03/05/2023 STM left UT, pectorals, lateral trunk and in SL to left scapular area and lats with cocoa butter Supine wand flexion and scaption x 5 PROM left shoulder flexion, scaption, abduction MFR to left axilla and left upper arm and forearm area of cording n supine: PTperformed with instruction to pt.Short neck, 5 diaphragmatic breaths, R axillary nodes and establishment of interaxillary pathway, L inguinal nodes and establishment of axilloinguinal pathway, then L breast moving fluid towards pathways spending extra time in any areas of fibrosis then retracing all steps.    03/02/2023 Peach dot pad made to put in lower breast area of enlarged pores In supine: PTperformed with instruction to pt.Short neck, 5 diaphragmatic breaths, R axillary nodes and establishment of interaxillary pathway, L inguinal nodes and establishment of axilloinguinal pathway, then L breast moving fluid towards pathways spending extra time in any areas of fibrosis then retracing all steps. Pt then performed all steps with therapist observing and assisting with VC's and TC's prn Assessed left upper arm/forearm;several small cords felt. Therapist performed MFR to forearm and upper arm. Showed pt how to do NTS on herself.   02/27/2023 STM left UT, pectorals, lateral trunk and in SL to left scapular area and lats with cocoa butter Supine wand flexion and scaption x 5 PROM left shoulder flexion, scaption, abduction MFR to left axilla LTR x 5 with arms outstretched Scar massage left lateral breast. Doorway pectoral stretch x 3, wall stretch b x 3 Standing lat stretch x 2     PATIENT EDUCATION: 02/20/2022 Education details: Discussed POC, Left UQ tightness and limitations in ROM, breast swelling, compression bra, MLD Person educated: Patient Education method: Explanation Education comprehension: verbalized understanding  HOME EXERCISE  PROGRAM: None given; will resume 4 post of exercises and hold on band exercises right now.  ASSESSMENT:  CLINICAL IMPRESSION:   tightness still noted in clavicular and axillary border of pectoralis major, and pec minor but appears to be improving. Enlarged pores still noted left breast with mild swelling.     OBJECTIVE IMPAIRMENTS: decreased activity tolerance, decreased knowledge of condition, decreased ROM, increased edema, increased fascial restrictions, impaired UE functional use, postural dysfunction, and pain.   ACTIVITY LIMITATIONS: sleeping and reach over head  PARTICIPATION LIMITATIONS: interpersonal relationship  PERSONAL FACTORS: 3+ comorbidities: Left breast cancer s/p infusions and radiation  are also affecting patient's functional outcome.   REHAB POTENTIAL: Excellent  CLINICAL DECISION MAKING: Stable/uncomplicated  EVALUATION COMPLEXITY: Low  GOALS: Goals reviewed with patient? Yes  SHORT TERM GOALS= LONG TERM GOALS: Target date: 04/04/2023  Pt will be independent in MLD for reducing left breast edema Baseline: Goal status: INITIAL  2.  Pt will note decreased breast swelling/tenderness by atleast 50% Baseline:  Goal status: INITIAL  3.  Pt will be able to  lie on her left side to sleep for short periods of time Baseline:  Goal status: INITIAL  4.  Pt will have left shoulder ROM WNL without increased pain with reaching Baseline:  Goal status: INITIAL  5.  Breast complaints survey will be reduced to no greater than 9 Baseline:  Goal status: INITIAL PLAN:  PT FREQUENCY: 2x/week  PT DURATION: 6 weeks  PLANNED INTERVENTIONS: Therapeutic exercises, Therapeutic activity, Patient/Family education, Self Care, Joint mobilization, Orthotic/Fit training, Manual lymph drainage, scar mobilization, Vasopneumatic device, Manual therapy, and Re-evaluation  PLAN FOR NEXT SESSION: cupping,how was peach foam for bra?, review L. breast MLD,  MFR to cording,STM to left  UQ, PROM left shoulder, LTR with arms outstretched, wand exs, MLD to left breast and instruct pt., update HEP prn.  Waynette Buttery, PT 03/20/2023, 10:59 AM

## 2023-03-20 NOTE — Telephone Encounter (Signed)
Scheduled appointments per WQ. Left voicemail. 

## 2023-03-22 ENCOUNTER — Ambulatory Visit: Payer: BC Managed Care – PPO

## 2023-03-22 DIAGNOSIS — C50412 Malignant neoplasm of upper-outer quadrant of left female breast: Secondary | ICD-10-CM | POA: Diagnosis not present

## 2023-03-22 DIAGNOSIS — I890 Lymphedema, not elsewhere classified: Secondary | ICD-10-CM

## 2023-03-22 DIAGNOSIS — M25612 Stiffness of left shoulder, not elsewhere classified: Secondary | ICD-10-CM

## 2023-03-22 DIAGNOSIS — Z483 Aftercare following surgery for neoplasm: Secondary | ICD-10-CM

## 2023-03-22 NOTE — Therapy (Signed)
OUTPATIENT PHYSICAL THERAPY  UPPER EXTREMITY ONCOLOGY EVALUATION  Patient Name: Caitlyn Torres MRN: [id number] DOB:02-25-1961, 62 y.o., female Today's Date: 03/22/2023  END OF SESSION:  PT End of Session - 03/22/23 1006     Visit Number 9    Number of Visits 12    Date for PT Re-Evaluation 04/04/23    PT Start Time 1006    PT Stop Time 1057    PT Time Calculation (min) 51 min    Activity Tolerance Patient tolerated treatment well    Behavior During Therapy [facility] for tasks assessed/performed             Past Medical History:  Diagnosis Date   Anxiety    Arthritis    Cancer (HCC) 03/2022   L breast   Depression    Family history of breast cancer 05/15/2022   Family history of pancreatic cancer 05/15/2022   Family history of prostate cancer 05/15/2022   GERD (gastroesophageal reflux disease)    History of hiatal hernia    patient believes she may have been told she has this years ago   Past Surgical History:  Procedure Laterality Date   BREAST BIOPSY  09/19/2022   MM LT RADIOACTIVE SEED EA ADD LESION LOC MAMMO GUIDE 09/19/2022 [facility]   BREAST BIOPSY  09/19/2022   MM LT RADIOACTIVE SEED LOC MAMMO GUIDE 09/19/2022 [facility]   BREAST LUMPECTOMY WITH RADIOACTIVE SEED AND AXILLARY LYMPH NODE DISSECTION Left 09/20/2022   Procedure: LEFT BREAST SEED LUMPECTOMY, LEFT AXILLARY LYMPH NODE DISSECTION;  Surgeon: Harriette Bouillon, MD;  Location: [facility] OR;  Service: General;  Laterality: Left;  GEN & PEC BLOCK   CERVIX SURGERY     Laser removal of pre-cancer cells   LASIK Bilateral    PORTACATH PLACEMENT Right 04/20/2022   Procedure: PORT PLACEMENT;  Surgeon: Harriette Bouillon, MD;  Location: [facility] OR;  Service: General;  Laterality: Right;   Patient Active Problem List   Diagnosis Date Noted   Other fatigue 08/28/2022   Acute pain 08/28/2022   Genetic testing 06/01/2022   Family history of breast cancer 05/15/2022   Family history of pancreatic cancer 05/15/2022    Family history of prostate cancer 05/15/2022   Port-A-Cath in place 04/28/2022   Malignant neoplasm of upper-outer quadrant of left breast in female, estrogen receptor positive (HCC) 03/29/2022      REFERRING PROVIDER: [name], MD  REFERRING DIAG: Left breast swelling/tenderness  THERAPY DIAG:  Malignant neoplasm of upper-outer quadrant of left female breast, unspecified estrogen receptor status (HCC)  Stiffness of left shoulder, not elsewhere classified  Lymphedema of breast  Aftercare following surgery for neoplasm  ONSET DATE: Early March  Rationale for Evaluation and Treatment: Rehabilitation  SUBJECTIVE:  SUBJECTIVE STATEMENT:  My right LB and leg started hurting me and I didn't sleep well last night. I am sore right now. My swelling is about the same.  PERTINENT HISTORY:  03/14/2022:Screening detected left breast masses 1.5 cm and 1.3 cm with enlarged left axillary lymph node, biopsy revealed grade 3 IDC with lymph node being positive, ER 95%, PR 0%, HER2 2+ by IHC and FISH positive with a ratio 2.45 and a copy #4.9, the second biopsy was HER2 negative with a ratio  1. Neoadjuvant chemotherapy with Herceptin Perjeta and letrozole for 8 cycles.  (Our original recommendation is TCHP x6 cycles but patient is not willing to go through chemo) 2. left lumpectomy and ALND 09/20/2022: 0.6 cm IDC 2/14 lymph nodes, margins negative, ER 95%, PR 0%, HER2 positive 3. Followed by adjuvant radiation therapy  4.  Adjuvant antiestrogen therapy with neratinib She had left lumpectomy on 09/20/2022 with 2/14 LN's    PAIN:  Are you having pain? Yes, my neck, back, right leg NPRS scale:0/10 at rest, 3/10 left neck,  breast tender 2/10, axilla 2/10, back 6/10 Pain location: LEFT Pain orientation: Left   PAIN TYPE: tight and sore Pain description: constant  Aggravating factors: being hugged, reaching, lying on left side Relieving factors: Compression?  PRECAUTIONS: Left UE lymphedema precautions  WEIGHT BEARING RESTRICTIONS: No  FALLS:  Has patient fallen in last 6 months? 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  SENSATION: Light touch: Deficits     POSTURE: forward head, rounded shoulders  UPPER EXTREMITY AROM/PROM:  A/PROM RIGHT   eval   Shoulder extension 42  Shoulder flexion 163  Shoulder abduction 179  Shoulder internal rotation 58  Shoulder external rotation 88    (Blank rows = not tested)  A/PROM LEFT   eval LEFT 03/13/2023  Shoulder extension 37   Shoulder flexion 144 159  Shoulder abduction 140 168  Shoulder internal rotation 56   Shoulder external rotation 84     (Blank rows = not tested)  CERVICAL AROM: All within normal limits:     UPPER EXTREMITY STRENGTH:   LYMPHEDEMA ASSESSMENTS:   SURGERY TYPE/DATE: Left lumpectomy,09/20/2022  NUMBER OF LYMPH NODES REMOVED: 2+/14  CHEMOTHERAPY: No at her request, Did Herceptin, Perjeta and letrozole  RADIATION:yes but stopped early at her request  HORMONE TREATMENT: YES  INFECTIONS:  NO   LYMPHEDEMA ASSESSMENTS:   LANDMARK RIGHT  eval  At axilla    15 cm proximal to olecranon process   10 cm proximal to olecranon process   Olecranon process   15 cm proximal to ulnar styloid process   10 cm proximal to ulnar styloid process   Just proximal to ulnar styloid process   Across hand at thumb web space   At base of 2nd digit   (Blank rows = not tested)  LANDMARK LEFT  eval  At axilla  15 cm proximal to olecranon process   10 cm proximal to olecranon process   Olecranon process   15 cm proximal to ulnar styloid process   10 cm proximal to ulnar styloid process   Just proximal to ulnar styloid process   Across hand at thumb web space   At base of 2nd digit   (Blank rows = not tested)    L-DEX LYMPHEDEMA SCREENING: The patient was assessed using the L-Dex machine today to produce a lymphedema index baseline score. The patient will be reassessed on a regular basis (typically every 3 months) to obtain new L-Dex scores. If the score is > 6.5 points away from his/her baseline score indicating onset of subclinical lymphedema, it will be recommended to wear a compression garment for 4 weeks, 12 hours per day and then be reassessed. If the score continues to be > 6.5 points from baseline at reassessment, we will initiate lymphedema treatment. Assessing in this manner has a 95% rate of preventing clinically significant lymphedema.   BREAST COMPLAINTS QUESTIONNAIRE Pain:8 Heaviness:8 Swollen feeling:9 Tense Skin:8 Redness:6 Bra Print:9 Size of Pores:5 Hard feeling: 6 Total:   59  /80 A Score over 9 indicates lymphedema issues in the breast   TODAY'S TREATMENT:                                                                                                                                          DATE:  Pt permission and consent throughout each step of examination and treatment with modification and draping if requested when working on sensitive  areas  03/22/2023 STM left UT, pectorals, lateral trunk with cocoa butter, and SL to left scapular region with STM and cupping PROM left shoulder flexion, scaption, abduction, ER On half foam roll; bilateral flexion, scaption, horizontal abduction, snow angel x 5 Snow angels x 10 on table PTperformed MLD to left breast;Short neck, 5 diaphragmatic breaths, R axillary nodes and establishment of interaxillary pathway, L inguinal nodes and establishment of axilloinguinal pathway, then L breast moving fluid towards pathways spending extra time in any areas of fibrosis then retracing all steps.   03/20/2023 STM left UT, pectorals, lateral trunk with cocoa butter, and SL to left scapular region with STM and cupping PROM left shoulder flexion, scaption, abduction MFR to left axilla and upper arm area of cording in D2 fleion PTperformed MLD to left breast;Short neck, 5 diaphragmatic breaths, R axillary nodes and establishment of interaxillary pathway, L inguinal nodes and establishment of axilloinguinal pathway, then L breast moving fluid towards pathways spending extra time in any areas of fibrosis then retracing all steps. 03/15/2023 STM left UT, pectorals, lateral trunk with cocoa butter, and SL to left scapular region with STM and cupping Supine wand x 5 ea PROM left shoulder flexion, scaption, abduction MFR to left axilla and upper arm area of cording in D2 fleion Lower trunk rotation x  5 arms outstretched and goal post arms Supine yellow band flexion, horizontal abduction x 5-7 reps Standing lat stretch x 3 Pt advised to place foam pad in bra during the day     03/13/2023 STM left UT, pectorals, lateral trunk and left lower rib cage with cocoa butter, and SL to left scapular region with STM and cupping PROM left shoulder flexion, scaption, abduction MFR to left axilla n supine: PTperformed with instruction to pt.Short neck, 5 diaphragmatic breaths, R axillary nodes and establishment of  interaxillary pathway, L inguinal nodes and establishment of axilloinguinal pathway, then L breast moving fluid towards pathways spending extra time in any areas of fibrosis then retracing all steps. Measured Left AROM  03/08/2023  STM left UT, pectorals, lateral trunk and left lower rib cage with cocoa butter, and SL to left scapular region with STM and cupping Supine wand flexion and scaption x 5 PROM left shoulder flexion, scaption, abduction MFR to left axilla and left upper arm and forearm area of cording Gray foam pad in TG soft made to pad rib area from bra 03/05/2023 STM left UT, pectorals, lateral trunk and in SL to left scapular area and lats with cocoa butter Supine wand flexion and scaption x 5 PROM left shoulder flexion, scaption, abduction MFR to left axilla and left upper arm and forearm area of cording n supine: PTperformed with instruction to pt.Short neck, 5 diaphragmatic breaths, R axillary nodes and establishment of interaxillary pathway, L inguinal nodes and establishment of axilloinguinal pathway, then L breast moving fluid towards pathways spending extra time in any areas of fibrosis then retracing all steps.    03/02/2023 Peach dot pad made to put in lower breast area of enlarged pores In supine: PTperformed with instruction to pt.Short neck, 5 diaphragmatic breaths, R axillary nodes and establishment of interaxillary pathway, L inguinal nodes and establishment of axilloinguinal pathway, then L breast moving fluid towards pathways spending extra time in any areas of fibrosis then retracing all steps. Pt then performed all steps with therapist observing and assisting with VC's and TC's prn Assessed left upper arm/forearm;several small cords felt. Therapist performed MFR to forearm and upper arm. Showed pt how to do NTS on herself.   02/27/2023 STM left UT, pectorals, lateral trunk and in SL to left scapular area and lats with cocoa butter Supine wand flexion and scaption  x 5 PROM left shoulder flexion, scaption, abduction MFR to left axilla LTR x 5 with arms outstretched Scar massage left lateral breast. Doorway pectoral stretch x 3, wall stretch b x 3 Standing lat stretch x 2     PATIENT EDUCATION: 02/20/2022 Education details: Discussed POC, Left UQ tightness and limitations in ROM, breast swelling, compression bra, MLD Person educated: Patient Education method: Explanation Education comprehension: verbalized understanding  HOME EXERCISE PROGRAM: None given; will resume 4 post of exercises and hold on band exercises right now.  ASSESSMENT:  CLINICAL IMPRESSION: Pt with new complaint of right LBP and leg pain that started several days ago. Fewer nodules noted in left pectorals today and cording not visible or easily palpated. Pt did well with half foam roll and said it felt good. She may have one at home.  OBJECTIVE IMPAIRMENTS: decreased activity tolerance, decreased knowledge of condition, decreased ROM, increased edema, increased fascial restrictions, impaired UE functional use, postural dysfunction, and pain.   ACTIVITY LIMITATIONS: sleeping and reach over head  PARTICIPATION LIMITATIONS: interpersonal relationship  PERSONAL FACTORS: 3+ comorbidities: Left breast cancer s/p infusions and radiation  are also affecting patient's functional outcome.   REHAB POTENTIAL: Excellent  CLINICAL DECISION MAKING: Stable/uncomplicated  EVALUATION COMPLEXITY: Low  GOALS: Goals reviewed with patient? Yes  SHORT TERM GOALS= LONG TERM GOALS: Target date: 04/04/2023  Pt will be independent in MLD for reducing left breast edema Baseline: Goal status: INITIAL  2.  Pt will note decreased breast swelling/tenderness by atleast 50% Baseline:  Goal status: INITIAL  3.  Pt will be able to lie on her left side to sleep for short periods of time Baseline:  Goal status: INITIAL  4.  Pt will have left shoulder ROM WNL without increased pain with  reaching Baseline:  Goal status: INITIAL  5.  Breast complaints survey will be reduced to no greater than 9 Baseline:  Goal status: INITIAL PLAN:  PT FREQUENCY: 2x/week  PT DURATION: 6 weeks  PLANNED INTERVENTIONS: Therapeutic exercises, Therapeutic activity, Patient/Family education, Self Care, Joint mobilization, Orthotic/Fit training, Manual lymph drainage, scar mobilization, Vasopneumatic device, Manual therapy, and Re-evaluation  PLAN FOR NEXT SESSION:Half foam roll, Flexi? cupping,how was peach foam for bra?, review L. breast MLD,  MFR to cording,STM to left UQ, PROM left shoulder, LTR with arms outstretched, wand exs, MLD to left breast and instruct pt., update HEP prn.  Waynette Buttery, PT 03/22/2023, 11:01 AM

## 2023-03-26 ENCOUNTER — Ambulatory Visit: Payer: BC Managed Care – PPO

## 2023-03-26 DIAGNOSIS — C50412 Malignant neoplasm of upper-outer quadrant of left female breast: Secondary | ICD-10-CM | POA: Diagnosis not present

## 2023-03-26 DIAGNOSIS — R293 Abnormal posture: Secondary | ICD-10-CM

## 2023-03-26 DIAGNOSIS — Z483 Aftercare following surgery for neoplasm: Secondary | ICD-10-CM

## 2023-03-26 DIAGNOSIS — M25612 Stiffness of left shoulder, not elsewhere classified: Secondary | ICD-10-CM

## 2023-03-26 DIAGNOSIS — I890 Lymphedema, not elsewhere classified: Secondary | ICD-10-CM

## 2023-03-26 NOTE — Therapy (Signed)
OUTPATIENT PHYSICAL THERAPY  UPPER EXTREMITY ONCOLOGY EVALUATION  Patient Name: Caitlyn Torres MRN: [id number] DOB:1961-07-21, 62 y.o., female Today's Date: 03/26/2023  END OF SESSION:  PT End of Session - 03/26/23 0905     Visit Number 10    Number of Visits 12    Date for PT Re-Evaluation 04/04/23    PT Start Time 0906    PT Stop Time 0952    PT Time Calculation (min) 46 min    Activity Tolerance Patient tolerated treatment well    Behavior During Therapy [facility] for tasks assessed/performed             Past Medical History:  Diagnosis Date   Anxiety    Arthritis    Cancer (HCC) 03/2022   L breast   Depression    Family history of breast cancer 05/15/2022   Family history of pancreatic cancer 05/15/2022   Family history of prostate cancer 05/15/2022   GERD (gastroesophageal reflux disease)    History of hiatal hernia    patient believes she may have been told she has this years ago   Past Surgical History:  Procedure Laterality Date   BREAST BIOPSY  09/19/2022   MM LT RADIOACTIVE SEED EA ADD LESION LOC MAMMO GUIDE 09/19/2022 [facility]   BREAST BIOPSY  09/19/2022   MM LT RADIOACTIVE SEED LOC MAMMO GUIDE 09/19/2022 [facility]   BREAST LUMPECTOMY WITH RADIOACTIVE SEED AND AXILLARY LYMPH NODE DISSECTION Left 09/20/2022   Procedure: LEFT BREAST SEED LUMPECTOMY, LEFT AXILLARY LYMPH NODE DISSECTION;  Surgeon: Harriette Bouillon, MD;  Location: [facility] OR;  Service: General;  Laterality: Left;  GEN & PEC BLOCK   CERVIX SURGERY     Laser removal of pre-cancer cells   LASIK Bilateral    PORTACATH PLACEMENT Right 04/20/2022   Procedure: PORT PLACEMENT;  Surgeon: Harriette Bouillon, MD;  Location: [facility] OR;  Service: General;  Laterality: Right;   Patient Active Problem List   Diagnosis Date Noted   Other fatigue 08/28/2022   Acute pain 08/28/2022   Genetic testing 06/01/2022   Family history of breast cancer 05/15/2022   Family history of pancreatic cancer 05/15/2022    Family history of prostate cancer 05/15/2022   Port-A-Cath in place 04/28/2022   Malignant neoplasm of upper-outer quadrant of left breast in female, estrogen receptor positive (HCC) 03/29/2022      REFERRING PROVIDER: [name], MD  REFERRING DIAG: Left breast swelling/tenderness  THERAPY DIAG:  Malignant neoplasm of upper-outer quadrant of left female breast, unspecified estrogen receptor status (HCC)  Stiffness of left shoulder, not elsewhere classified  Lymphedema of breast  Aftercare following surgery for neoplasm  Abnormal posture  ONSET DATE: Early March  Rationale for Evaluation and Treatment: Rehabilitation  SUBJECTIVE:  SUBJECTIVE STATEMENT:  I still feel really tender in the chest area to touch. The cording bothered me a little yesterday, but not as bad today. Did fine after last visit.  PERTINENT HISTORY:  03/14/2022:Screening detected left breast masses 1.5 cm and 1.3 cm with enlarged left axillary lymph node, biopsy revealed grade 3 IDC with lymph node being positive, ER 95%, PR 0%, HER2 2+ by IHC and FISH positive with a ratio 2.45 and a copy #4.9, the second biopsy was HER2 negative with a ratio  1. Neoadjuvant chemotherapy with Herceptin Perjeta and letrozole for 8 cycles.  (Our original recommendation is TCHP x6 cycles but patient is not willing to go through chemo) 2. left lumpectomy and ALND 09/20/2022: 0.6 cm IDC 2/14 lymph nodes, margins negative, ER 95%, PR 0%, HER2 positive 3. Followed by adjuvant radiation therapy  4.  Adjuvant antiestrogen therapy with neratinib She had left lumpectomy on 09/20/2022 with 2/14 LN's    PAIN:  Are you having pain? Yes, my neck, back, right leg NPRS scale:0/10 at rest, 3/10 left neck,  breast tenderness 2-3/10, axilla 2/10, back  5/10 Pain location: LEFT Pain orientation: Left  PAIN TYPE: tight and sore Pain description: constant  Aggravating factors: being hugged, reaching, lying on left side Relieving factors: Compression?  PRECAUTIONS: Left UE lymphedema precautions  WEIGHT BEARING RESTRICTIONS: No  FALLS:  Has patient fallen in last 6 months? 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  SENSATION: Light touch: Deficits     POSTURE: forward head, rounded shoulders  UPPER EXTREMITY AROM/PROM:  A/PROM RIGHT   eval   Shoulder extension 42  Shoulder flexion 163  Shoulder abduction 179  Shoulder internal rotation 58  Shoulder external rotation 88    (Blank rows = not tested)  A/PROM LEFT   eval LEFT 03/13/2023  Shoulder extension 37   Shoulder flexion 144 159  Shoulder abduction 140 168  Shoulder internal rotation 56   Shoulder external rotation 84     (Blank rows = not tested)  CERVICAL AROM: All within normal limits:     UPPER EXTREMITY STRENGTH:   LYMPHEDEMA ASSESSMENTS:   SURGERY TYPE/DATE: Left lumpectomy,09/20/2022  NUMBER OF LYMPH NODES REMOVED: 2+/14  CHEMOTHERAPY: No at her request, Did Herceptin, Perjeta and letrozole  RADIATION:yes but stopped early at her  request  HORMONE TREATMENT: YES  INFECTIONS: NO   LYMPHEDEMA ASSESSMENTS:   LANDMARK RIGHT  eval  At axilla    15 cm proximal to olecranon process   10 cm proximal to olecranon process   Olecranon process   15 cm proximal to ulnar styloid process   10 cm proximal to ulnar styloid process   Just proximal to ulnar styloid process   Across hand at thumb web space   At base of 2nd digit   (Blank rows = not tested)  LANDMARK LEFT  eval  At  axilla    15 cm proximal to olecranon process   10 cm proximal to olecranon process   Olecranon process   15 cm proximal to ulnar styloid process   10 cm proximal to ulnar styloid process   Just proximal to ulnar styloid process   Across hand at thumb web space   At base of 2nd digit   (Blank rows = not tested)    L-DEX LYMPHEDEMA SCREENING: The patient was assessed using the L-Dex machine today to produce a lymphedema index baseline score. The patient will be reassessed on a regular basis (typically every 3 months) to obtain new L-Dex scores. If the score is > 6.5 points away from his/her baseline score indicating onset of subclinical lymphedema, it will be recommended to wear a compression garment for 4 weeks, 12 hours per day and then be reassessed. If the score continues to be > 6.5 points from baseline at reassessment, we will initiate lymphedema treatment. Assessing in this manner has a 95% rate of preventing clinically significant lymphedema.   BREAST COMPLAINTS QUESTIONNAIRE Pain:8 Heaviness:8 Swollen feeling:9 Tense Skin:8 Redness:6 Bra Print:9 Size of Pores:5 Hard feeling: 6 Total:   59  /80 A Score over 9 indicates lymphedema issues in the breast   TODAY'S TREATMENT:                                                                                                                                          DATE:  Pt permission and consent throughout each step of examination and treatment with modification and draping if  requested when working on sensitive areas  03/26/2023 STM left UT, pectorals, lateral trunk with cocoa butter, and SL to left scapular region with STM and cupping MFR to left UE cording at axilla, upper arm and forearm PROM left shoulder flexion, scaption, abduction, ER Snow angels x 10   03/22/2023 STM left UT, pectorals, lateral trunk with cocoa butter, and SL to left scapular region with STM and cupping PROM left shoulder flexion, scaption, abduction, ER On half foam roll; bilateral flexion, scaption, horizontal abduction, snow angel x 5 Snow angels x 10 on table PTperformed MLD to left breast;Short neck, 5 diaphragmatic breaths, R axillary nodes and establishment of interaxillary pathway, L inguinal nodes and establishment of axilloinguinal pathway, then L breast moving fluid towards pathways spending extra time in any areas of fibrosis then retracing all steps.   03/20/2023 STM left UT, pectorals, lateral trunk with cocoa butter, and SL to left scapular region with STM and cupping PROM left shoulder flexion, scaption, abduction MFR to left axilla and upper arm area of cording in D2 fleion PTperformed MLD to left breast;Short neck, 5 diaphragmatic breaths, R axillary nodes and establishment of interaxillary pathway, L inguinal nodes and establishment of axilloinguinal pathway, then L breast moving fluid towards pathways spending extra time in any areas of fibrosis then retracing all steps.  03/15/2023 STM left UT, pectorals, lateral trunk with cocoa butter, and SL to left scapular region with STM and cupping Supine wand x 5 ea PROM left shoulder flexion, scaption, abduction MFR to left axilla and upper arm area of cording in D2 fleion Lower trunk rotation x 5 arms outstretched and goal post arms Supine yellow band flexion, horizontal abduction x 5-7 reps Standing lat stretch x 3 Pt advised to place foam pad in bra during the day     03/13/2023 STM left UT, pectorals, lateral trunk and  left lower rib cage with cocoa butter, and SL to left scapular region with STM and cupping PROM left shoulder flexion, scaption, abduction MFR to left axilla n supine: PTperformed with instruction to pt.Short neck, 5 diaphragmatic breaths, R axillary nodes and establishment of interaxillary pathway, L inguinal nodes and establishment of axilloinguinal pathway, then L breast moving fluid towards pathways spending extra time in any areas of fibrosis then retracing all steps. Measured Left AROM  03/08/2023  STM left UT, pectorals, lateral trunk and left lower rib cage with cocoa butter, and SL to left scapular region with STM and cupping Supine wand flexion and scaption x 5 PROM left shoulder flexion, scaption, abduction MFR to left axilla and left upper arm and forearm area of cording Gray foam pad in TG soft made to pad rib area from bra 03/05/2023 STM left UT, pectorals, lateral trunk and in SL to left scapular area and lats with cocoa butter Supine wand flexion and scaption x 5 PROM left shoulder flexion, scaption, abduction MFR to left axilla and left upper arm and forearm area of cording n supine: PTperformed with instruction to pt.Short neck, 5 diaphragmatic breaths, R axillary nodes and establishment of interaxillary pathway, L inguinal nodes and establishment of axilloinguinal pathway, then L breast moving fluid towards pathways spending extra time in any areas of fibrosis then retracing all steps.    03/02/2023 Peach dot pad made to put in lower breast area of enlarged pores In supine: PTperformed with instruction to pt.Short neck, 5 diaphragmatic breaths, R axillary nodes and establishment of interaxillary pathway, L inguinal nodes and establishment of axilloinguinal pathway, then L breast moving fluid towards pathways spending extra time in any areas of fibrosis then retracing all steps. Pt then performed all steps with therapist observing and assisting with VC's and TC's prn Assessed  left upper arm/forearm;several small cords felt. Therapist performed MFR to forearm and upper arm. Showed pt how to do NTS on herself.   02/27/2023 STM left UT, pectorals, lateral trunk and in SL to left scapular area and lats with cocoa butter Supine wand flexion and scaption x 5 PROM left shoulder flexion, scaption, abduction MFR to left axilla LTR x 5 with arms outstretched Scar massage left lateral breast. Doorway pectoral stretch x 3, wall stretch b x 3 Standing lat stretch x 2     PATIENT EDUCATION: 02/20/2022 Education details: Discussed POC, Left UQ tightness and limitations in ROM, breast swelling, compression bra, MLD Person educated: Patient Education method: Explanation Education comprehension: verbalized understanding  HOME EXERCISE PROGRAM: None given; will resume 4 post of exercises and hold on band exercises right now.  ASSESSMENT:  CLINICAL IMPRESSION:  Pt with continued tightness in left pectorals, lateral trunk and with cording still present and contributing to decreased abduction ROM. Pt had to decrease appts this week due to other appts with Cancer center.  OBJECTIVE IMPAIRMENTS: decreased activity tolerance, decreased knowledge of condition, decreased ROM, increased edema, increased fascial  restrictions, impaired UE functional use, postural dysfunction, and pain.   ACTIVITY LIMITATIONS: sleeping and reach over head  PARTICIPATION LIMITATIONS: interpersonal relationship  PERSONAL FACTORS: 3+ comorbidities: Left breast cancer s/p infusions and radiation  are also affecting patient's functional outcome.   REHAB POTENTIAL: Excellent  CLINICAL DECISION MAKING: Stable/uncomplicated  EVALUATION COMPLEXITY: Low  GOALS: Goals reviewed with patient? Yes  SHORT TERM GOALS= LONG TERM GOALS: Target date: 04/04/2023  Pt will be independent in MLD for reducing left breast edema Baseline: Goal status: INITIAL  2.  Pt will note decreased breast  swelling/tenderness by atleast 50% Baseline:  Goal status: INITIAL  3.  Pt will be able to lie on her left side to sleep for short periods of time Baseline:  Goal status: INITIAL  4.  Pt will have left shoulder ROM WNL without increased pain with reaching Baseline:  Goal status: INITIAL  5.  Breast complaints survey will be reduced to no greater than 9 Baseline:  Goal status: INITIAL PLAN:  PT FREQUENCY: 2x/week  PT DURATION: 6 weeks  PLANNED INTERVENTIONS: Therapeutic exercises, Therapeutic activity, Patient/Family education, Self Care, Joint mobilization, Orthotic/Fit training, Manual lymph drainage, scar mobilization, Vasopneumatic device, Manual therapy, and Re-evaluation  PLAN FOR NEXT SESSION:SOZO screen  next,Half foam roll, Flexi? cupping,how was peach foam for bra?, review L. breast MLD,  MFR to cording,STM to left UQ, PROM left shoulder, LTR with arms outstretched, wand exs, MLD to left breast and instruct pt., update HEP prn.  Waynette Buttery, PT 03/26/2023, 9:53 AM

## 2023-03-28 ENCOUNTER — Ambulatory Visit (HOSPITAL_COMMUNITY)
Admission: RE | Admit: 2023-03-28 | Discharge: 2023-03-28 | Disposition: A | Discharge status: Home / Self Care | Payer: BC Managed Care – PPO | Source: Ambulatory Visit | Attending: Adult Health | Admitting: Adult Health

## 2023-03-28 ENCOUNTER — Ambulatory Visit: Payer: BC Managed Care – PPO

## 2023-03-28 ENCOUNTER — Other Ambulatory Visit (HOSPITAL_COMMUNITY): Payer: BC Managed Care – PPO

## 2023-03-28 DIAGNOSIS — R293 Abnormal posture: Secondary | ICD-10-CM

## 2023-03-28 DIAGNOSIS — Z01818 Encounter for other preprocedural examination: Secondary | ICD-10-CM | POA: Insufficient documentation

## 2023-03-28 DIAGNOSIS — M25612 Stiffness of left shoulder, not elsewhere classified: Secondary | ICD-10-CM

## 2023-03-28 DIAGNOSIS — Z0189 Encounter for other specified special examinations: Secondary | ICD-10-CM

## 2023-03-28 DIAGNOSIS — I890 Lymphedema, not elsewhere classified: Secondary | ICD-10-CM

## 2023-03-28 DIAGNOSIS — C50412 Malignant neoplasm of upper-outer quadrant of left female breast: Secondary | ICD-10-CM

## 2023-03-28 DIAGNOSIS — Z170 Estrogen receptor positive status [ER+]: Secondary | ICD-10-CM | POA: Diagnosis not present

## 2023-03-28 DIAGNOSIS — Z483 Aftercare following surgery for neoplasm: Secondary | ICD-10-CM

## 2023-03-28 LAB — ECHOCARDIOGRAM COMPLETE
AR max vel: 2.17 cm2
AV Area VTI: 2.22 cm2
AV Area mean vel: 2.16 cm2
AV Mean grad: 3 mmHg
AV Peak grad: 4.2 mmHg
Ao pk vel: 1.03 m/s
Area-P 1/2: 4.17 cm2
Est EF: 55
S' Lateral: 3.1 cm
Single Plane A4C EF: 52.6 %

## 2023-03-28 NOTE — Progress Notes (Signed)
Patient Care Team: Carlean Jews, NP as PCP - General (Family Medicine) Pershing Proud, RN as Oncology Nurse Navigator Donnelly Angelica, RN as Oncology Nurse Navigator [name], MD as Consulting Physician (Hematology and Oncology)  DIAGNOSIS:  Encounter Diagnosis  Name Primary?   Malignant neoplasm of upper-outer quadrant of left breast in female, estrogen receptor positive (HCC) Yes    SUMMARY OF ONCOLOGIC HISTORY: Oncology History  Malignant neoplasm of upper-outer quadrant of left breast in female, estrogen receptor positive (HCC)  03/14/2022 Initial Diagnosis   Screening detected left breast masses 1.5 cm and 1.3 cm with enlarged left axillary lymph node, biopsy revealed grade 3 IDC with lymph node being positive, ER 95%, PR 0%, HER2 2+ by IHC and FISH positive with a ratio 2.45 and a copy #4.9, the second biopsy was HER2 negative with a ratio 2.15 and a copy #3.55   03/29/2022 Cancer Staging   Staging form: Breast, AJCC 8th Edition - Clinical: Stage IIA (cT1c, cN1, cM0, G3, ER+, PR-, HER2+) - Signed by [name], MD on 03/29/2022 Stage prefix: Initial diagnosis Histologic grading system: 3 grade system   03/29/2022 - 09/2022 Anti-estrogen oral therapy   Letrozole daily   04/21/2022 -  Chemotherapy   Herceptin/Perjeta every 21 days initially x 3, will reassess with MRI and then will decide whether to continue or to add chemo.  (Original recommendation was TCHP x 6)      Genetic Testing   [facility] CancerNext-Expanded Panel was Negative. Report date is 05/30/2022.  The CancerNext-Expanded gene panel offered by [facility] and includes sequencing, rearrangement, and RNA analysis for the following 77 genes: AIP, ALK, APC, ATM, AXIN2, BAP1, BARD1, BLM, BMPR1A, BRCA1, BRCA2, BRIP1, CDC73, CDH1, CDK4, CDKN1B, CDKN2A, CHEK2, CTNNA1, DICER1, FANCC, FH, FLCN, GALNT12, KIF1B, LZTR1, MAX, MEN1, MET, MLH1, MSH2, MSH3, MSH6, MUTYH, NBN, NF1, NF2, NTHL1, PALB2, PHOX2B, PMS2, POT1,  PRKAR1A, PTCH1, PTEN, RAD51C, RAD51D, RB1, RECQL, RET, SDHA, SDHAF2, SDHB, SDHC, SDHD, SMAD4, SMARCA4, SMARCB1, SMARCE1, STK11, SUFU, TMEM127, TP53, TSC1, TSC2, VHL and XRCC2 (sequencing and deletion/duplication); EGFR, EGLN1, HOXB13, KIT, MITF, PDGFRA, POLD1, and POLE (sequencing only); EPCAM and GREM1 (deletion/duplication only).    04/21/2022 - 09/14/2022 Chemotherapy   Given neoadjuvantly with Letrozole (declined neoadjuvant chemotherapy with TCHP) Patient is on Treatment Plan : BREAST Trastuzumab  + Pertuzumab q21d x 13 cycles      09/20/2022 Surgery   left lumpectomy and ALND: 0.6 cm IDC 2/14 lymph nodes, margins negative, ER 95%, PR 0%, HER2 positive   10/13/2022 -  Chemotherapy   Patient is on Treatment Plan : BREAST ADO-Trastuzumab Emtansine (Kadcyla) q21d     11/07/2022 - 12/06/2022 Radiation Therapy   Site/dose:   The patient had a prescriptive dose of 50.4 Gy in 28 fractions to the breast and SCLV region using a 4-field approach. This was to be delivered using a 3-D conformal technique. The patient did not receive the planned boost over 10 Gy in 5 fractions.  In total, she completed 36 Gy in 20 of the 33 planned fractions.   02/2023 -  Anti-estrogen oral therapy   Anastrozole daily     CHIEF COMPLIANT:  Kadcyla maintenance cycle 8  INTERVAL HISTORY: Caitlyn Torres is a 62 year old above-mentioned history of HER2 positive breast cancer was currently on Kadcyla. She presents to the clinic for a follow-up and to discuss antiestrogen therapy. He reports that  She is tolerating treatment. She says the numbness in the tip of the fingers are th  same. She has drop some objects, but she is able to write. She is tolerating the anastrozole. She says she needs to walk, something she has not done.   ALLERGIES:  is allergic to aleve [naproxen sodium], aspirin, macrobid [nitrofurantoin], motrin [ibuprofen], and tylenol [acetaminophen].  MEDICATIONS:  Current Outpatient Medications   Medication Sig Dispense Refill   anastrozole (ARIMIDEX) 1 MG tablet Take 1 tablet (1 mg total) by mouth daily. 90 tablet 3   Brimonidine Tartrate (LUMIFY) 0.025 % SOLN Place 1 drop into both eyes daily as needed (red eyes).     carboxymethylcellulose (REFRESH PLUS) 0.5 % SOLN Place 1 drop into both eyes 3 (three) times daily as needed (dry eyes).     cetirizine (ZYRTEC) 10 MG tablet Take 10 mg by mouth daily.     lidocaine (XYLOCAINE) 2 % solution Swish and swallow 5mL every 4 hours as needed for sore throat 100 mL 0   Lidocaine-Prilocaine &Lido HCl 2.5-2.5 & 3.88 % KIT APPLY TO AFFECTED AREA ONCE AS DIRECTED     Multiple Vitamins-Iron (CHLORELLA) CAPS Take 1 capsule by mouth daily.     ondansetron (ZOFRAN) 8 MG tablet Take 1 tablet (8 mg total) by mouth every 8 (eight) hours as needed for nausea or vomiting. 30 tablet 1   OVER THE COUNTER MEDICATION Take 1 capsule by mouth daily. Amla     OVER THE COUNTER MEDICATION Take 1 capsule by mouth daily. Mushroom Complex     OVER THE COUNTER MEDICATION Place 1 Application vaginally daily as needed (moisture). Femininity     QUERCETIN PO Take 1 capsule by mouth daily.     TURMERIC CURCUMIN PO Take 1 capsule by mouth daily.     No current facility-administered medications for this visit.    PHYSICAL EXAMINATION: ECOG PERFORMANCE STATUS: 1 - Symptomatic but completely ambulatory  Vitals:   04/02/23 0842  BP: (!) 143/70  Pulse: 73  Temp: 97.7 F (36.5 C)  SpO2: 100%   Filed Weights   04/02/23 0842  Weight: 117 lb 6.4 oz (53.3 kg)      LABORATORY DATA:  I have reviewed the data as listed    Latest Ref Rng & Units 03/09/2023    8:21 AM 02/16/2023    9:03 AM 01/26/2023    9:42 AM  CMP  Glucose 70 - 99 mg/dL 86  95  95   BUN 6 - 20 mg/dL 6  8  10    Creatinine 0.44 - 1.00 mg/dL 1.61  0.96  0.45   Sodium 135 - 145 mmol/L 136  134  136   Potassium 3.5 - 5.1 mmol/L 3.7  3.6  4.2   Chloride 98 - 111 mmol/L 102  102  103   CO2 22 - 32  mmol/L 29  26  28    Calcium 8.9 - 10.3 mg/dL 9.8  9.2  9.4   Total Protein 6.5 - 8.1 g/dL 6.9  6.9  6.9   Total Bilirubin 0.3 - 1.2 mg/dL 0.4  0.7  0.5   Alkaline Phos 38 - 126 U/L 61  56  58   AST 15 - 41 U/L 29  49  30   ALT 0 - 44 U/L 21  49  25     Lab Results  Component Value Date   WBC 2.7 (L) 04/02/2023   HGB 13.4 04/02/2023   HCT 39.8 04/02/2023   MCV 82.1 04/02/2023   PLT 160 04/02/2023   NEUTROABS 1.3 (L) 04/02/2023  ASSESSMENT & PLAN:  Malignant neoplasm of upper-outer quadrant of left breast in female, 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   Treatment plan based on multidisciplinary tumor board: 1. Neoadjuvant chemotherapy with Herceptin Perjeta and letrozole for 8 cycles.  (Our original recommendation is TCHP x6 cycles but patient is not willing to go through chemo) 2. left lumpectomy and ALND 09/20/2022: 0.6 cm IDC 2/14 lymph nodes, margins negative, ER 95%, PR 0%, HER2 positive 3. Followed by adjuvant radiation therapy 11/07/22-12/06/22  4.  Adjuvant antiestrogen therapy with neratinib ------------------------------------------------------------------------------------------ Kadcyla Toxicities:  Fatigue: managing with energy conservation At risk for heart failure: Her most recent echo was completed 11/2022 and was normal.  Repeat echo ordered.  Leukopenia: mild, monitoring, okay to treat with an ANC of 1.3 today.  I reduced the dosage of Kadcyla today.     Anastrozole Toxicities:  Fatigue: improving with time, managed with energy conservation  Echocardiogram: EF 55%, impaired diastolic relaxation.  I instructed her to start exercising by walking  regularly. Patient is doing Signatera for minimal residual disease monitoring. Return to clinic every 3 weeks for Kadcyla    No orders of the defined types were placed in this encounter.  The patient has a good understanding of the overall plan. she agrees with it. she will call with any problems that may develop before the next visit here. Total time spent: 30 mins including face to face time and time spent for planning, charting and co-ordination of care   Tamsen Meek, MD 04/02/23    I Janan Ridge am acting as a Neurosurgeon for [employer]  I have reviewed the above documentation for accuracy and completeness, and I agree with the above.

## 2023-03-28 NOTE — Progress Notes (Signed)
  Echocardiogram 2D Echocardiogram has been performed.  Caitlyn Torres 03/28/2023, 8:52 AM

## 2023-03-28 NOTE — Therapy (Signed)
OUTPATIENT PHYSICAL THERAPY  UPPER EXTREMITY ONCOLOGY EVALUATION  Patient Name: Caitlyn Torres MRN: [id number] DOB:May 11, 1961, 62 y.o., female Today's Date: 03/28/2023  END OF SESSION:  PT End of Session - 03/28/23 1605     Visit Number 11    Number of Visits 12    Date for PT Re-Evaluation 04/04/23    PT Start Time 1607    PT Stop Time 1658    PT Time Calculation (min) 51 min    Activity Tolerance Patient tolerated treatment well    Behavior During Therapy [facility] for tasks assessed/performed             Past Medical History:  Diagnosis Date   Anxiety    Arthritis    Cancer (HCC) 03/2022   L breast   Depression    Family history of breast cancer 05/15/2022   Family history of pancreatic cancer 05/15/2022   Family history of prostate cancer 05/15/2022   GERD (gastroesophageal reflux disease)    History of hiatal hernia    patient believes she may have been told she has this years ago   Past Surgical History:  Procedure Laterality Date   BREAST BIOPSY  09/19/2022   MM LT RADIOACTIVE SEED EA ADD LESION LOC MAMMO GUIDE 09/19/2022 [facility]   BREAST BIOPSY  09/19/2022   MM LT RADIOACTIVE SEED LOC MAMMO GUIDE 09/19/2022 [facility]   BREAST LUMPECTOMY WITH RADIOACTIVE SEED AND AXILLARY LYMPH NODE DISSECTION Left 09/20/2022   Procedure: LEFT BREAST SEED LUMPECTOMY, LEFT AXILLARY LYMPH NODE DISSECTION;  Surgeon: Harriette Bouillon, MD;  Location: [facility] OR;  Service: General;  Laterality: Left;  GEN & PEC BLOCK   CERVIX SURGERY     Laser removal of pre-cancer cells   LASIK Bilateral    PORTACATH PLACEMENT Right 04/20/2022   Procedure: PORT PLACEMENT;  Surgeon: Harriette Bouillon, MD;  Location: [facility] OR;  Service: General;  Laterality: Right;   Patient Active Problem List   Diagnosis Date Noted   Other fatigue 08/28/2022   Acute pain 08/28/2022   Genetic testing 06/01/2022   Family history of breast cancer 05/15/2022   Family history of pancreatic cancer 05/15/2022    Family history of prostate cancer 05/15/2022   Port-A-Cath in place 04/28/2022   Malignant neoplasm of upper-outer quadrant of left breast in female, estrogen receptor positive (HCC) 03/29/2022      REFERRING PROVIDER: [name], MD  REFERRING DIAG: Left breast swelling/tenderness  THERAPY DIAG:  Malignant neoplasm of upper-outer quadrant of left female breast, unspecified estrogen receptor status (HCC)  Stiffness of left shoulder, not elsewhere classified  Lymphedema of breast  Aftercare following surgery for neoplasm  Abnormal posture  ONSET DATE: Early March  Rationale for Evaluation and Treatment: Rehabilitation  SUBJECTIVE:  SUBJECTIVE STATEMENT:  I had an echocardiogram this morning and it was pretty painful on my left breast when she was doing it. I had to tell her to ease off a bit. I have done nothing today so the cording is tight.  PERTINENT HISTORY:  03/14/2022:Screening detected left breast masses 1.5 cm and 1.3 cm with enlarged left axillary lymph node, biopsy revealed grade 3 IDC with lymph node being positive, ER 95%, PR 0%, HER2 2+ by IHC and FISH positive with a ratio 2.45 and a copy #4.9, the second biopsy was HER2 negative with a ratio  1. Neoadjuvant chemotherapy with Herceptin Perjeta and letrozole for 8 cycles.  (Our original recommendation is TCHP x6 cycles but patient is not willing to go through chemo) 2. left lumpectomy and ALND 09/20/2022: 0.6 cm IDC 2/14 lymph nodes, margins negative, ER 95%, PR 0%, HER2 positive 3. Followed by adjuvant radiation therapy  4.  Adjuvant antiestrogen therapy with neratinib She had left lumpectomy on 09/20/2022 with 2/14 LN's    PAIN:  Are you having pain? Yes, my neck, back, right leg NPRS scale:0/10 at rest, 3/10 left neck,  breast  tenderness 2-3/10, axilla 2/10, back 5/10 Pain location: LEFT Pain orientation: Left  PAIN TYPE: tight and sore Pain description: constant  Aggravating factors: being hugged, reaching, lying on left side Relieving factors: Compression?  PRECAUTIONS: Left UE lymphedema precautions  WEIGHT BEARING RESTRICTIONS: No  FALLS:  Has patient fallen in last 6 months? 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  SENSATION: Light touch: Deficits     POSTURE: forward head, rounded shoulders  UPPER EXTREMITY AROM/PROM:  A/PROM RIGHT   eval   Shoulder extension 42  Shoulder flexion 163  Shoulder abduction 179  Shoulder internal rotation 58  Shoulder external rotation 88    (Blank rows = not tested)  A/PROM LEFT   eval LEFT 03/13/2023  Shoulder extension 37   Shoulder flexion 144 159  Shoulder abduction 140 168  Shoulder internal rotation 56   Shoulder external rotation 84     (Blank rows = not tested)  CERVICAL AROM: All within normal limits:     UPPER EXTREMITY STRENGTH:   LYMPHEDEMA ASSESSMENTS:   SURGERY TYPE/DATE: Left lumpectomy,09/20/2022  NUMBER OF LYMPH NODES REMOVED: 2+/14  CHEMOTHERAPY: No at her request, Did Herceptin, Perjeta and letrozole  RADIATION:yes but  stopped early at her request  HORMONE TREATMENT: YES  INFECTIONS: NO   LYMPHEDEMA ASSESSMENTS:   LANDMARK RIGHT  eval  At axilla    15 cm proximal to olecranon process   10 cm proximal to olecranon process   Olecranon process   15 cm proximal to ulnar styloid process   10 cm proximal to ulnar styloid process   Just proximal to ulnar styloid process   Across hand at thumb web space   At base of 2nd digit   (  Blank rows = not tested)  LANDMARK LEFT  eval  At axilla    15 cm proximal to olecranon process   10 cm proximal to olecranon process   Olecranon process   15 cm proximal to ulnar styloid process   10 cm proximal to ulnar styloid process   Just proximal to ulnar styloid process   Across hand at thumb web space   At base of 2nd digit   (Blank rows = not tested)    L-DEX LYMPHEDEMA SCREENING: The patient was assessed using the L-Dex machine today to produce a lymphedema index baseline score. The patient will be reassessed on a regular basis (typically every 3 months) to obtain new L-Dex scores. If the score is > 6.5 points away from his/her baseline score indicating onset of subclinical lymphedema, it will be recommended to wear a compression garment for 4 weeks, 12 hours per day and then be reassessed. If the score continues to be > 6.5 points from baseline at reassessment, we will initiate lymphedema treatment. Assessing in this manner has a 95% rate of preventing clinically significant lymphedema.   BREAST COMPLAINTS QUESTIONNAIRE Pain:8 Heaviness:8 Swollen feeling:9 Tense Skin:8 Redness:6 Bra Print:9 Size of Pores:5 Hard feeling: 6 Total:   59  /80 A Score over 9 indicates lymphedema issues in the breast   TODAY'S TREATMENT:                                                                                                                                          DATE:  Pt permission and consent throughout each step of examination and treatment with  modification and draping if requested when working on sensitive areas  03/28/2023 SOZO screen done and  is well within normal limits; next 3 month screen set up MFR to left UE cording at axilla, upper arm and forearm PROM left shoulder flexion, scaption, abduction, ER MFR bilateral pectoral regions MLD to left breast;Short neck, 5 diaphragmatic breaths, R axillary nodes and establishment of interaxillary pathway, L inguinal nodes and establishment of axilloinguinal pathway, then L breast moving fluid towards pathways spending extra time in any areas of fibrosis then retracing all steps  03/26/2023 STM left UT, pectorals, lateral trunk with cocoa butter, and SL to left scapular region with STM and cupping MFR to left UE cording at axilla, upper arm and forearm PROM left shoulder flexion, scaption, abduction, ER Snow angels x 10 03/22/2023 STM left UT, pectorals, lateral trunk with cocoa butter, and SL to left scapular region with STM and cupping PROM left shoulder flexion, scaption, abduction, ER On half foam roll; bilateral flexion, scaption, horizontal abduction, snow angel x 5 Snow angels x 10 on table PTperformed MLD to left breast;Short neck, 5 diaphragmatic breaths, R axillary nodes and establishment of interaxillary pathway, L inguinal nodes and establishment of axilloinguinal pathway, then L breast moving fluid towards pathways spending extra time in  any areas of fibrosis then retracing all steps.   03/20/2023 STM left UT, pectorals, lateral trunk with cocoa butter, and SL to left scapular region with STM and cupping PROM left shoulder flexion, scaption, abduction MFR to left axilla and upper arm area of cording in D2 fleion PTperformed MLD to left breast;Short neck, 5 diaphragmatic breaths, R axillary nodes and establishment of interaxillary pathway, L inguinal nodes and establishment of axilloinguinal pathway, then L breast moving fluid towards pathways spending extra time in any areas  of fibrosis then retracing all steps. 03/15/2023 STM left UT, pectorals, lateral trunk with cocoa butter, and SL to left scapular region with STM and cupping Supine wand x 5 ea PROM left shoulder flexion, scaption, abduction MFR to left axilla and upper arm area of cording in D2 fleion Lower trunk rotation x 5 arms outstretched and goal post arms Supine yellow band flexion, horizontal abduction x 5-7 reps Standing lat stretch x 3 Pt advised to place foam pad in bra during the day     03/13/2023 STM left UT, pectorals, lateral trunk and left lower rib cage with cocoa butter, and SL to left scapular region with STM and cupping PROM left shoulder flexion, scaption, abduction MFR to left axilla n supine: PTperformed with instruction to pt.Short neck, 5 diaphragmatic breaths, R axillary nodes and establishment of interaxillary pathway, L inguinal nodes and establishment of axilloinguinal pathway, then L breast moving fluid towards pathways spending extra time in any areas of fibrosis then retracing all steps. Measured Left AROM  03/08/2023  STM left UT, pectorals, lateral trunk and left lower rib cage with cocoa butter, and SL to left scapular region with STM and cupping Supine wand flexion and scaption x 5 PROM left shoulder flexion, scaption, abduction MFR to left axilla and left upper arm and forearm area of cording Gray foam pad in TG soft made to pad rib area from bra 03/05/2023 STM left UT, pectorals, lateral trunk and in SL to left scapular area and lats with cocoa butter Supine wand flexion and scaption x 5 PROM left shoulder flexion, scaption, abduction MFR to left axilla and left upper arm and forearm area of cording n supine: PTperformed with instruction to pt.Short neck, 5 diaphragmatic breaths, R axillary nodes and establishment of interaxillary pathway, L inguinal nodes and establishment of axilloinguinal pathway, then L breast moving fluid towards pathways spending extra time in  any areas of fibrosis then retracing all steps.    03/02/2023 Peach dot pad made to put in lower breast area of enlarged pores In supine: PTperformed with instruction to pt.Short neck, 5 diaphragmatic breaths, R axillary nodes and establishment of interaxillary pathway, L inguinal nodes and establishment of axilloinguinal pathway, then L breast moving fluid towards pathways spending extra time in any areas of fibrosis then retracing all steps. Pt then performed all steps with therapist observing and assisting with VC's and TC's prn Assessed left upper arm/forearm;several small cords felt. Therapist performed MFR to forearm and upper arm. Showed pt how to do NTS on herself.   02/27/2023 STM left UT, pectorals, lateral trunk and in SL to left scapular area and lats with cocoa butter Supine wand flexion and scaption x 5 PROM left shoulder flexion, scaption, abduction MFR to left axilla LTR x 5 with arms outstretched Scar massage left lateral breast. Doorway pectoral stretch x 3, wall stretch b x 3 Standing lat stretch x 2     PATIENT EDUCATION: 02/20/2022 Education details: Discussed POC, Left UQ tightness and  limitations in ROM, breast swelling, compression bra, MLD Person educated: Patient Education method: Explanation Education comprehension: verbalized understanding  HOME EXERCISE PROGRAM: None given; will resume 4 post of exercises and hold on band exercises right now.  ASSESSMENT:  CLINICAL IMPRESSION:  Pt with complaints of central chest discomfort  just left of sternum after having echo today. Felt tight to pt with MFR techniques. Visible outline of bra at medial breast today. Pt placed foam pad in central chest area to decrease discomfort and bra outline.  OBJECTIVE IMPAIRMENTS: decreased activity tolerance, decreased knowledge of condition, decreased ROM, increased edema, increased fascial restrictions, impaired UE functional use, postural dysfunction, and pain.   ACTIVITY  LIMITATIONS: sleeping and reach over head  PARTICIPATION LIMITATIONS: interpersonal relationship  PERSONAL FACTORS: 3+ comorbidities: Left breast cancer s/p infusions and radiation  are also affecting patient's functional outcome.   REHAB POTENTIAL: Excellent  CLINICAL DECISION MAKING: Stable/uncomplicated  EVALUATION COMPLEXITY: Low  GOALS: Goals reviewed with patient? Yes  SHORT TERM GOALS= LONG TERM GOALS: Target date: 04/04/2023  Pt will be independent in MLD for reducing left breast edema Baseline: Goal status: INITIAL  2.  Pt will note decreased breast swelling/tenderness by atleast 50% Baseline:  Goal status: INITIAL  3.  Pt will be able to lie on her left side to sleep for short periods of time Baseline:  Goal status: INITIAL  4.  Pt will have left shoulder ROM WNL without increased pain with reaching Baseline:  Goal status: INITIAL  5.  Breast complaints survey will be reduced to no greater than 9 Baseline:  Goal status: INITIAL PLAN:  PT FREQUENCY: 2x/week  PT DURATION: 6 weeks  PLANNED INTERVENTIONS: Therapeutic exercises, Therapeutic activity, Patient/Family education, Self Care, Joint mobilization, Orthotic/Fit training, Manual lymph drainage, scar mobilization, Vasopneumatic device, Manual therapy, and Re-evaluation  PLAN FOR NEXT SESSION:SOZO screen  next,Half foam roll, Flexi? cupping,how was peach foam for bra?, review L. breast MLD,  MFR to cording,STM to left UQ, PROM left shoulder, LTR with arms outstretched, wand exs, MLD to left breast and instruct pt., update HEP prn.  Waynette Buttery, PT 03/28/2023, 5:14 PM

## 2023-03-30 ENCOUNTER — Other Ambulatory Visit: Payer: BC Managed Care – PPO

## 2023-03-30 ENCOUNTER — Ambulatory Visit: Payer: BC Managed Care – PPO

## 2023-03-30 ENCOUNTER — Ambulatory Visit: Payer: BC Managed Care – PPO | Admitting: Hematology and Oncology

## 2023-04-02 ENCOUNTER — Inpatient Hospital Stay: Payer: BC Managed Care – PPO

## 2023-04-02 ENCOUNTER — Ambulatory Visit: Payer: BC Managed Care – PPO

## 2023-04-02 ENCOUNTER — Inpatient Hospital Stay: Payer: BC Managed Care – PPO | Attending: Hematology and Oncology

## 2023-04-02 ENCOUNTER — Telehealth: Payer: Self-pay | Admitting: *Deleted

## 2023-04-02 ENCOUNTER — Inpatient Hospital Stay (HOSPITAL_BASED_OUTPATIENT_CLINIC_OR_DEPARTMENT_OTHER): Payer: BC Managed Care – PPO | Admitting: Hematology and Oncology

## 2023-04-02 VITALS — BP 143/70 | HR 73 | Temp 97.7°F | Wt 117.4 lb

## 2023-04-02 VITALS — BP 113/65 | HR 79 | Temp 98.4°F | Resp 14

## 2023-04-02 DIAGNOSIS — Z170 Estrogen receptor positive status [ER+]: Secondary | ICD-10-CM

## 2023-04-02 DIAGNOSIS — Z95828 Presence of other vascular implants and grafts: Secondary | ICD-10-CM

## 2023-04-02 DIAGNOSIS — C50412 Malignant neoplasm of upper-outer quadrant of left female breast: Secondary | ICD-10-CM | POA: Diagnosis present

## 2023-04-02 DIAGNOSIS — Z5111 Encounter for antineoplastic chemotherapy: Secondary | ICD-10-CM | POA: Diagnosis present

## 2023-04-02 DIAGNOSIS — Z79811 Long term (current) use of aromatase inhibitors: Secondary | ICD-10-CM | POA: Diagnosis not present

## 2023-04-02 LAB — CBC WITH DIFFERENTIAL (CANCER CENTER ONLY)
Abs Immature Granulocytes: 0.01 10*3/uL (ref 0.00–0.07)
Basophils Absolute: 0.1 10*3/uL (ref 0.0–0.1)
Basophils Relative: 2 %
Eosinophils Absolute: 0.2 10*3/uL (ref 0.0–0.5)
Eosinophils Relative: 7 %
HCT: 39.8 % (ref 36.0–46.0)
Hemoglobin: 13.4 g/dL (ref 12.0–15.0)
Immature Granulocytes: 0 %
Lymphocytes Relative: 29 %
Lymphs Abs: 0.8 10*3/uL (ref 0.7–4.0)
MCH: 27.6 pg (ref 26.0–34.0)
MCHC: 33.7 g/dL (ref 30.0–36.0)
MCV: 82.1 fL (ref 80.0–100.0)
Monocytes Absolute: 0.4 10*3/uL (ref 0.1–1.0)
Monocytes Relative: 13 %
Neutro Abs: 1.3 10*3/uL — ABNORMAL LOW (ref 1.7–7.7)
Neutrophils Relative %: 49 %
Platelet Count: 160 10*3/uL (ref 150–400)
RBC: 4.85 MIL/uL (ref 3.87–5.11)
RDW: 15.1 % (ref 11.5–15.5)
WBC Count: 2.7 10*3/uL — ABNORMAL LOW (ref 4.0–10.5)
nRBC: 0 % (ref 0.0–0.2)

## 2023-04-02 LAB — CMP (CANCER CENTER ONLY)
ALT: 26 U/L (ref 0–44)
AST: 34 U/L (ref 15–41)
Albumin: 4.3 g/dL (ref 3.5–5.0)
Alkaline Phosphatase: 65 U/L (ref 38–126)
Anion gap: 5 (ref 5–15)
BUN: 8 mg/dL (ref 6–20)
CO2: 29 mmol/L (ref 22–32)
Calcium: 9.5 mg/dL (ref 8.9–10.3)
Chloride: 103 mmol/L (ref 98–111)
Creatinine: 0.6 mg/dL (ref 0.44–1.00)
GFR, Estimated: >60 mL/min (ref >60)
Glucose, Bld: 89 mg/dL (ref 70–99)
Potassium: 3.9 mmol/L (ref 3.5–5.1)
Sodium: 137 mmol/L (ref 135–145)
Total Bilirubin: 0.4 mg/dL (ref 0.3–1.2)
Total Protein: 7.2 g/dL (ref 6.5–8.1)

## 2023-04-02 MED ORDER — SODIUM CHLORIDE 0.9 % IV SOLN: Freq: Once | INTRAVENOUS | Status: AC

## 2023-04-02 MED ORDER — HEPARIN SOD (PORK) LOCK FLUSH 100 UNIT/ML IV SOLN
500.0000 [IU] | Freq: Once | INTRAVENOUS | Status: AC | PRN
  Administered 2023-04-02: 500 [IU]

## 2023-04-02 MED ORDER — SODIUM CHLORIDE 0.9 % IV SOLN
3.0000 mg/kg | Freq: Once | INTRAVENOUS | Status: AC
  Administered 2023-04-02: 160 mg via INTRAVENOUS
  Filled 2023-04-02: qty 8

## 2023-04-02 MED ORDER — DIPHENHYDRAMINE HCL 25 MG PO CAPS
25.0000 mg | ORAL_CAPSULE | Freq: Once | ORAL | Status: AC
  Administered 2023-04-02: 25 mg via ORAL
  Filled 2023-04-02: qty 1

## 2023-04-02 MED ORDER — SODIUM CHLORIDE 0.9% FLUSH
10.0000 mL | Freq: Once | INTRAVENOUS | Status: AC
  Administered 2023-04-02: 10 mL

## 2023-04-02 MED ORDER — SODIUM CHLORIDE 0.9% FLUSH
10.0000 mL | INTRAVENOUS | Status: DC | PRN
  Administered 2023-04-02: 10 mL

## 2023-04-02 MED ORDER — PROCHLORPERAZINE MALEATE 10 MG PO TABS
10.0000 mg | ORAL_TABLET | Freq: Once | ORAL | Status: AC
  Administered 2023-04-02: 10 mg via ORAL
  Filled 2023-04-02: qty 1

## 2023-04-02 NOTE — Assessment & Plan Note (Addendum)
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   Treatment plan based on multidisciplinary tumor board: 1. Neoadjuvant chemotherapy with Herceptin Perjeta and letrozole for 8 cycles.  (Our original recommendation is TCHP x6 cycles but patient is not willing to go through chemo) 2. left lumpectomy and ALND 09/20/2022: 0.6 cm IDC 2/14 lymph nodes, margins negative, ER 95%, PR 0%, HER2 positive 3. Followed by adjuvant radiation therapy 11/07/22-12/06/22  4.  Adjuvant antiestrogen therapy with neratinib ------------------------------------------------------------------------   Kadcyla Toxicities:  Fatigue: managing with energy conservation At risk for heart failure: Her most recent echo was completed 03/2023 EF 55% Leukopenia: mild, monitoring   Labs reviewed CBC is stable ANC 1.5.     Anastrozole Toxicities:  Fatigue: improving with time Bone Health: bone density testing ordered  Return to clinic every 3 weeks for [facility]

## 2023-04-02 NOTE — Patient Instructions (Signed)
[facility]  Discharge Instructions: Thank you for choosing [facility] to provide your oncology and hematology care.   If you have a lab appointment with the Cancer Center, please go directly to the Cancer Center and check in at the registration area.   Wear comfortable clothing and clothing appropriate for easy access to any Portacath or PICC line.   We strive to give you quality time with your provider. You may need to reschedule your appointment if you arrive late (15 or more minutes).  Arriving late affects you and other patients whose appointments are after yours.  Also, if you miss three or more appointments without notifying the office, you may be dismissed from the clinic at the provider's discretion.      For prescription refill requests, have your pharmacy contact our office and allow 72 hours for refills to be completed.    Today you received the following chemotherapy and/or immunotherapy agents: Kadcyla.       To help prevent nausea and vomiting after your treatment, we encourage you to take your nausea medication as directed.  BELOW ARE SYMPTOMS THAT SHOULD BE REPORTED IMMEDIATELY: *FEVER GREATER THAN 100.4 F (38 C) OR HIGHER *CHILLS OR SWEATING *NAUSEA AND VOMITING THAT IS NOT CONTROLLED WITH YOUR NAUSEA MEDICATION *UNUSUAL SHORTNESS OF BREATH *UNUSUAL BRUISING OR BLEEDING *URINARY PROBLEMS (pain or burning when urinating, or frequent urination) *BOWEL PROBLEMS (unusual diarrhea, constipation, pain near the anus) TENDERNESS IN MOUTH AND THROAT WITH OR WITHOUT PRESENCE OF ULCERS (sore throat, sores in mouth, or a toothache) UNUSUAL RASH, SWELLING OR PAIN  UNUSUAL VAGINAL DISCHARGE OR ITCHING   Items with * indicate a potential emergency and should be followed up as soon as possible or go to the Emergency Department if any problems should occur.  Please show the CHEMOTHERAPY ALERT CARD or IMMUNOTHERAPY ALERT CARD at  check-in to the Emergency Department and triage nurse.  Should you have questions after your visit or need to cancel or reschedule your appointment, please contact [facility]  Dept: [phone]  and follow the prompts.  Office hours are 8:00 a.m. to 4:30 p.m. Monday - Friday. Please note that voicemails left after 4:00 p.m. may not be returned until the following business day.  We are closed weekends and major holidays. You have access to a nurse at all times for urgent questions. Please call the main number to the clinic Dept: [phone] and follow the prompts.   For any non-urgent questions, you may also contact your provider using MyChart. We now offer e-Visits for anyone 18 and older to request care online for non-urgent symptoms. For details visit [email or web address].   Also download the MyChart app! Go to the app store, search "MyChart", open the app, select , and log in with your MyChart username and password.   

## 2023-04-02 NOTE — Telephone Encounter (Signed)
-----   Message from Dellis Filbert, LPN sent at 1/61/0960  2:58 PM EDT ----- Anything specific you wanted me to address with pt? ----- Message ----- From: Loa Socks, NP Sent: 03/28/2023  12:04 PM EDT To: [facility]   ----- Message ----- From: Interface, [employer] Sent: 03/28/2023  10:10 AM EDT To: Loa Socks, NP

## 2023-04-02 NOTE — Progress Notes (Signed)
Ok to treat today with ANC of 1.3 per Dr. Earmon Phoenix note:  "Leukopenia: mild, monitoring, okay to treat with an ANC of 1.3 today.  I reduced the dosage of Kadcyla today. "

## 2023-04-02 NOTE — Telephone Encounter (Signed)
Per Lindsey Causey, NP, called pt with message below. Pt verbalized understanding.  

## 2023-04-05 ENCOUNTER — Ambulatory Visit: Payer: BC Managed Care – PPO

## 2023-04-05 DIAGNOSIS — C50412 Malignant neoplasm of upper-outer quadrant of left female breast: Secondary | ICD-10-CM | POA: Diagnosis not present

## 2023-04-05 DIAGNOSIS — R293 Abnormal posture: Secondary | ICD-10-CM

## 2023-04-05 DIAGNOSIS — I890 Lymphedema, not elsewhere classified: Secondary | ICD-10-CM

## 2023-04-05 DIAGNOSIS — M25612 Stiffness of left shoulder, not elsewhere classified: Secondary | ICD-10-CM

## 2023-04-05 DIAGNOSIS — Z483 Aftercare following surgery for neoplasm: Secondary | ICD-10-CM

## 2023-04-05 NOTE — Therapy (Signed)
OUTPATIENT PHYSICAL THERAPY  UPPER EXTREMITY ONCOLOGY TREATMENT  Patient Name: FLARA BESLER MRN: [id number] DOB:Sep 11, 1961, 62 y.o., female Today's Date: 04/05/2023  END OF SESSION:  PT End of Session - 04/05/23 1002     Visit Number 12    Number of Visits 20    Date for PT Re-Evaluation 05/03/23    PT Start Time 1003    PT Stop Time 1050    PT Time Calculation (min) 47 min    Activity Tolerance Patient tolerated treatment well    Behavior During Therapy [facility] for tasks assessed/performed             Past Medical History:  Diagnosis Date   Anxiety    Arthritis    Cancer (HCC) 03/2022   L breast   Depression    Family history of breast cancer 05/15/2022   Family history of pancreatic cancer 05/15/2022   Family history of prostate cancer 05/15/2022   GERD (gastroesophageal reflux disease)    History of hiatal hernia    patient believes she may have been told she has this years ago   Past Surgical History:  Procedure Laterality Date   BREAST BIOPSY  09/19/2022   MM LT RADIOACTIVE SEED EA ADD LESION LOC MAMMO GUIDE 09/19/2022 [facility]   BREAST BIOPSY  09/19/2022   MM LT RADIOACTIVE SEED LOC MAMMO GUIDE 09/19/2022 [facility]   BREAST LUMPECTOMY WITH RADIOACTIVE SEED AND AXILLARY LYMPH NODE DISSECTION Left 09/20/2022   Procedure: LEFT BREAST SEED LUMPECTOMY, LEFT AXILLARY LYMPH NODE DISSECTION;  Surgeon: Harriette Bouillon, MD;  Location: [facility] OR;  Service: General;  Laterality: Left;  GEN & PEC BLOCK   CERVIX SURGERY     Laser removal of pre-cancer cells   LASIK Bilateral    PORTACATH PLACEMENT Right 04/20/2022   Procedure: PORT PLACEMENT;  Surgeon: Harriette Bouillon, MD;  Location: [facility] OR;  Service: General;  Laterality: Right;   Patient Active Problem List   Diagnosis Date Noted   Other fatigue 08/28/2022   Acute pain 08/28/2022   Genetic testing 06/01/2022   Family history of breast cancer 05/15/2022   Family history of pancreatic cancer 05/15/2022    Family history of prostate cancer 05/15/2022   Port-A-Cath in place 04/28/2022   Malignant neoplasm of upper-outer quadrant of left breast in female, estrogen receptor positive (HCC) 03/29/2022      REFERRING PROVIDER: [name], MD  REFERRING DIAG: Left breast swelling/tenderness  THERAPY DIAG:  Malignant neoplasm of upper-outer quadrant of left female breast, unspecified estrogen receptor status (HCC)  Stiffness of left shoulder, not elsewhere classified  Lymphedema of breast  Aftercare following surgery for neoplasm  Abnormal posture  ONSET DATE: Early March  Rationale for Evaluation and Treatment: Rehabilitation  SUBJECTIVE:  SUBJECTIVE STATEMENT:  Still pretty sore at the lower medial breast and around the incision at lateral breast. I have been compliant with the compression bra for the most part. Swelling improved 10-20%, but still present.  I can do the MLD pretty well to the breast.   I still feel limited with end range of shoulder ROM. I had an infusion on Monday and I haven't felt great. I have had a nosebleed Tues, last night and today as a side effect from the infusion. The cording is till a little tight, but better overall. PERTINENT HISTORY:  03/14/2022:Screening detected left breast masses 1.5 cm and 1.3 cm with enlarged left axillary lymph node, biopsy revealed grade 3 IDC with lymph node being positive, ER 95%, PR 0%, HER2 2+ by IHC and FISH positive with a ratio 2.45 and a copy #4.9, the second biopsy was HER2 negative with a ratio  1. Neoadjuvant chemotherapy with Herceptin Perjeta and letrozole for 8 cycles.  (Our original recommendation is TCHP x6 cycles but patient is not willing to go through chemo) 2. left lumpectomy and ALND 09/20/2022: 0.6 cm IDC 2/14 lymph nodes, margins  negative, ER 95%, PR 0%, HER2 positive 3. Followed by adjuvant radiation therapy  4.  Adjuvant antiestrogen therapy with neratinib She had left lumpectomy on 09/20/2022 with 2/14 LN's    PAIN:  Are you having pain? Yes, my neck, back, right leg NPRS scale:0/10 at rest, 3/10 left neck,  breast tenderness 2-3/10, axilla 2/10, back 5/10 Pain location: LEFT Pain orientation: Left  PAIN TYPE: tight and sore Pain description: constant  Aggravating factors: being hugged, reaching, lying on left side Relieving factors: Compression?  PRECAUTIONS: Left UE lymphedema precautions  WEIGHT BEARING RESTRICTIONS: No  FALLS:  Has patient fallen in last 6 months? 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  SENSATION: Light touch: Deficits     POSTURE: forward head, rounded shoulders  UPPER EXTREMITY AROM/PROM:  A/PROM RIGHT   eval   Shoulder extension 42  Shoulder flexion 163  Shoulder abduction 179  Shoulder internal rotation 58  Shoulder external rotation 88    (Blank rows = not tested)  A/PROM LEFT   eval LEFT 03/13/2023  Shoulder extension 37   Shoulder flexion 144 159  Shoulder abduction 140 168  Shoulder internal rotation 56   Shoulder external rotation 84     (Blank rows =  not tested)  CERVICAL AROM: All within normal limits:     UPPER EXTREMITY STRENGTH:   LYMPHEDEMA ASSESSMENTS:   SURGERY TYPE/DATE: Left lumpectomy,09/20/2022  NUMBER OF LYMPH NODES REMOVED: 2+/14  CHEMOTHERAPY: No at her request, Did Herceptin, Perjeta and letrozole  RADIATION:yes but stopped early at her request  HORMONE TREATMENT: YES  INFECTIONS: NO   LYMPHEDEMA ASSESSMENTS:   LANDMARK RIGHT  eval  At axilla    15 cm proximal to olecranon process  10 cm proximal to olecranon process   Olecranon process   15 cm proximal to ulnar styloid process   10 cm proximal to ulnar styloid process   Just proximal to ulnar styloid process   Across hand at thumb web space   At base of 2nd digit   (Blank rows = not tested)  LANDMARK LEFT  eval  At axilla    15 cm proximal to olecranon process   10 cm proximal to olecranon process   Olecranon process   15 cm proximal to ulnar styloid process   10 cm proximal to ulnar styloid process   Just proximal to ulnar styloid process   Across hand at thumb web space   At base of 2nd digit   (Blank rows = not tested)    L-DEX LYMPHEDEMA SCREENING: The patient was assessed using the L-Dex machine today to produce a lymphedema index baseline score. The patient will be reassessed on a regular basis (typically every 3 months) to obtain new L-Dex scores. If the score is > 6.5 points away from his/her baseline score indicating onset of subclinical lymphedema, it will be recommended to wear a compression garment for 4 weeks, 12 hours per day and then be reassessed. If the score continues to be > 6.5 points from baseline at reassessment, we will initiate lymphedema treatment. Assessing in this manner has a 95% rate of preventing clinically significant lymphedema.   BREAST COMPLAINTS QUESTIONNAIRE(EVAL) Pain:8 Heaviness:8 Swollen feeling:9 Tense Skin:8 Redness:6 Bra Print:9 Size of Pores:5 Hard feeling: 6 Total:   59  /80 A Score  over 9 indicates lymphedema issues in the breast  BREAST COMPLAINTS QUESTIONNAIRE(04/05/2023) Pain:5 Heaviness:4 Swollen feeling:8 Tense Skin:5 Redness:2 Bra Print:9 Size of Po9res: Hard feeling7:  Total:   49  /80 A Score over 9 indicates lymphedema issues in the breast    TODAY'S TREATMENT:                                                                                                                                          DATE:  Pt permission and consent throughout each step of examination and treatment with modification and draping if requested when working on sensitive areas  04/05/2023 MFR to left UE cording at axilla, upper arm and forearm STM left UT,pectorals/lateral trunk PROM left shoulder flexion, scaption, abduction, ER MLD to left breast;Short neck, 5 diaphragmatic breaths, R axillary nodes and establishment of interaxillary pathway, L inguinal nodes and establishment of axilloinguinal pathway, then L breast moving fluid towards pathways spending extra time in any areas of fibrosis then retracing all steps Checked goals for PN  03/28/2023 [facility] screen done and  is well within normal limits; next 3 month screen set up MFR to left UE cording at axilla, upper arm and forearm PROM left shoulder flexion, scaption, abduction, ER MFR bilateral pectoral regions MLD to left breast;Short neck, 5 diaphragmatic breaths,  R axillary nodes and establishment of interaxillary pathway, L inguinal nodes and establishment of axilloinguinal pathway, then L breast moving fluid towards pathways spending extra time in any areas of fibrosis then retracing all steps  03/26/2023 STM left UT, pectorals, lateral trunk with cocoa butter, and SL to left scapular region with STM and cupping MFR to left UE cording at axilla, upper arm and forearm PROM left shoulder flexion, scaption, abduction, ER Snow angels x 10 03/22/2023 STM left UT, pectorals, lateral trunk with cocoa butter, and SL to left  scapular region with STM and cupping PROM left shoulder flexion, scaption, abduction, ER On half foam roll; bilateral flexion, scaption, horizontal abduction, snow angel x 5 Snow angels x 10 on table PTperformed MLD to left breast;Short neck, 5 diaphragmatic breaths, R axillary nodes and establishment of interaxillary pathway, L inguinal nodes and establishment of axilloinguinal pathway, then L breast moving fluid towards pathways spending extra time in any areas of fibrosis then retracing all steps.   03/20/2023 STM left UT, pectorals, lateral trunk with cocoa butter, and SL to left scapular region with STM and cupping PROM left shoulder flexion, scaption, abduction MFR to left axilla and upper arm area of cording in D2 fleion PTperformed MLD to left breast;Short neck, 5 diaphragmatic breaths, R axillary nodes and establishment of interaxillary pathway, L inguinal nodes and establishment of axilloinguinal pathway, then L breast moving fluid towards pathways spending extra time in any areas of fibrosis then retracing all steps. 03/15/2023 STM left UT, pectorals, lateral trunk with cocoa butter, and SL to left scapular region with STM and cupping Supine wand x 5 ea PROM left shoulder flexion, scaption, abduction MFR to left axilla and upper arm area of cording in D2 fleion Lower trunk rotation x 5 arms outstretched and goal post arms Supine yellow band flexion, horizontal abduction x 5-7 reps Standing lat stretch x 3 Pt advised to place foam pad in bra during the day     03/13/2023 STM left UT, pectorals, lateral trunk and left lower rib cage with cocoa butter, and SL to left scapular region with STM and cupping PROM left shoulder flexion, scaption, abduction MFR to left axilla n supine: PTperformed with instruction to pt.Short neck, 5 diaphragmatic breaths, R axillary nodes and establishment of interaxillary pathway, L inguinal nodes and establishment of axilloinguinal pathway, then L breast  moving fluid towards pathways spending extra time in any areas of fibrosis then retracing all steps. Measured Left AROM  03/08/2023  STM left UT, pectorals, lateral trunk and left lower rib cage with cocoa butter, and SL to left scapular region with STM and cupping Supine wand flexion and scaption x 5 PROM left shoulder flexion, scaption, abduction MFR to left axilla and left upper arm and forearm area of cording Gray foam pad in TG soft made to pad rib area from bra 03/05/2023 STM left UT, pectorals, lateral trunk and in SL to left scapular area and lats with cocoa butter Supine wand flexion and scaption x 5 PROM left shoulder flexion, scaption, abduction MFR to left axilla and left upper arm and forearm area of cording n supine: PTperformed with instruction to pt.Short neck, 5 diaphragmatic breaths, R axillary nodes and establishment of interaxillary pathway, L inguinal nodes and establishment of axilloinguinal pathway, then L breast moving fluid towards pathways spending extra time in any areas of fibrosis then retracing all steps.    03/02/2023 Peach dot pad made to put in lower breast area of enlarged pores In supine: PTperformed  with instruction to pt.Short neck, 5 diaphragmatic breaths, R axillary nodes and establishment of interaxillary pathway, L inguinal nodes and establishment of axilloinguinal pathway, then L breast moving fluid towards pathways spending extra time in any areas of fibrosis then retracing all steps. Pt then performed all steps with therapist observing and assisting with VC's and TC's prn Assessed left upper arm/forearm;several small cords felt. Therapist performed MFR to forearm and upper arm. Showed pt how to do NTS on herself.   02/27/2023 STM left UT, pectorals, lateral trunk and in SL to left scapular area and lats with cocoa butter Supine wand flexion and scaption x 5 PROM left shoulder flexion, scaption, abduction MFR to left axilla LTR x 5 with arms  outstretched Scar massage left lateral breast. Doorway pectoral stretch x 3, wall stretch b x 3 Standing lat stretch x 2     PATIENT EDUCATION: 02/20/2022 Education details: Discussed POC, Left UQ tightness and limitations in ROM, breast swelling, compression bra, MLD Person educated: Patient Education method: Explanation Education comprehension: verbalized understanding  HOME EXERCISE PROGRAM: None given; will resume 4 post of exercises and hold on band exercises right now.  ASSESSMENT:  CLINICAL IMPRESSION:  Pt has met goals 1 and 3 and is making progress with remaining goals. ROM has improved as has cording, but today pt struggled with posterior complaints of shoulder pain when trying to position for ROM.  Breast swelling is improving but tenderness and enlarged pores remain. She is compliant with compression bra, exercises and MLD. She will benefit from continued skilled therapy to address deficits and return to PLOF  OBJECTIVE IMPAIRMENTS: decreased activity tolerance, decreased knowledge of condition, decreased ROM, increased edema, increased fascial restrictions, impaired UE functional use, postural dysfunction, and pain.   ACTIVITY LIMITATIONS: sleeping and reach over head  PARTICIPATION LIMITATIONS: interpersonal relationship  PERSONAL FACTORS: 3+ comorbidities: Left breast cancer s/p infusions and radiation  are also affecting patient's functional outcome.   REHAB POTENTIAL: Excellent  CLINICAL DECISION MAKING: Stable/uncomplicated  EVALUATION COMPLEXITY: Low  GOALS: Goals reviewed with patient? Yes  SHORT TERM GOALS= LONG TERM GOALS: Target date: 04/04/2023  Pt will be independent in MLD for reducing left breast edema Baseline: Goal status: MET 04/05/2023  2.  Pt will note decreased breast swelling/tenderness by atleast 50% Baseline:  Goal status:ONGOING 3.  Pt will be able to lie on her left side to sleep for short periods of time Baseline:  Goal status:MET  04/05/2023 4.  Pt will have left shoulder ROM WNL without increased pain with reaching Baseline:  Goal status:ONGOING  5.  Breast complaints survey will be reduced to no greater than 9 Baseline:  Goal status: Ongoing PLAN:  PT FREQUENCY: 2x/week  PT DURATION: 4 weeks  PLANNED INTERVENTIONS: Therapeutic exercises, Therapeutic activity, Patient/Family education, Self Care, Joint mobilization, Orthotic/Fit training, Manual lymph drainage, scar mobilization, Vasopneumatic device, Manual therapy, and Re-evaluation  PLAN FOR NEXT SESSION:Half foam roll, Flexi? how was peach foam for bra?, review L. breast MLD,  MFR to cording,STM to left UQ, PROM left shoulder, LTR with arms outstretched,  MLD to left breast and review with pt., update HEP prn.  Waynette Buttery, PT 04/05/2023, 11:00 AM

## 2023-04-07 IMAGING — CT CT CHEST W/ CM
2 of 5 series · 12 of 36 positions shown, 15 images · IV contrast (APPLIED)
Comparison: CT of the abdomen and pelvis 04/14/2022. No prior chest
CT.

CLINICAL DATA: 60-year-old female with history of invasive breast
cancer. Follow-up study.

* Tracking Code: BO *
EXAM:
CT CHEST, ABDOMEN, AND PELVIS WITH CONTRAST
TECHNIQUE: Multidetector CT imaging of the chest, abdomen and pelvis was
performed following the standard protocol during bolus
administration of intravenous contrast.

[Series 2: cap with · axial · 0.73mm/px · z∈[-552,-42]mm · 9 of 126 slices shown, 12 images]
[im 12/126  mediastinal]
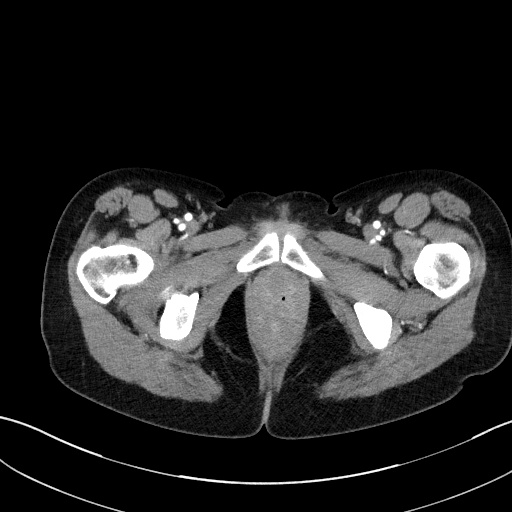
[im 12/126  lung]
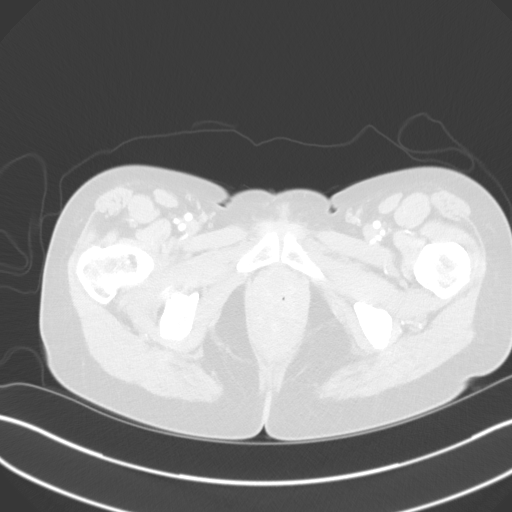
[im 23/126  lung]
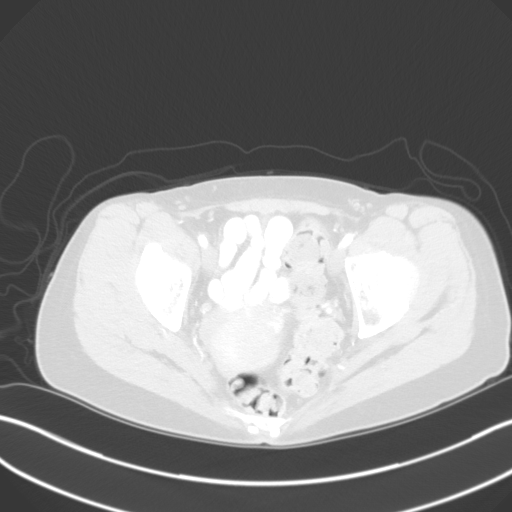
[im 35/126  lung]
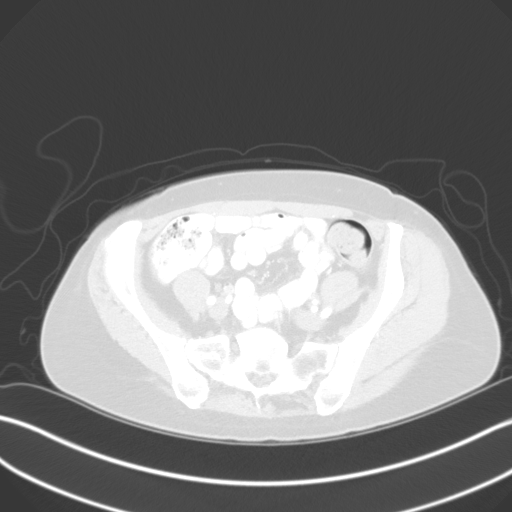
[im 46/126  lung]
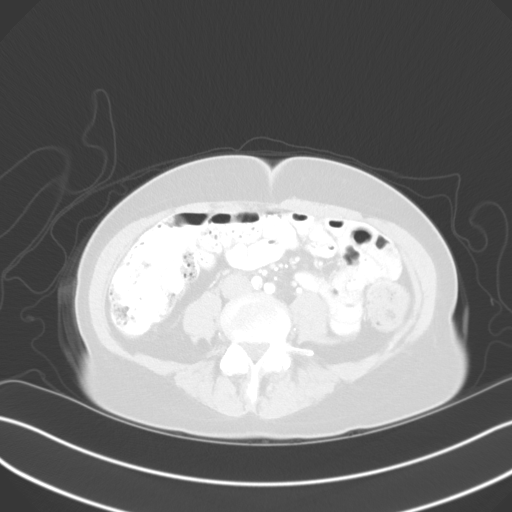
[im 69/126  mediastinal]
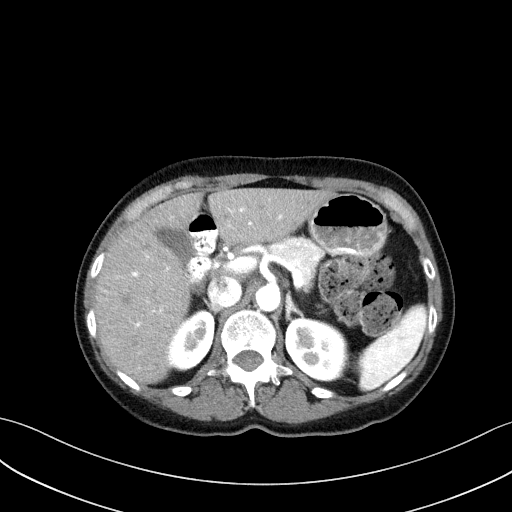
[im 69/126  lung]
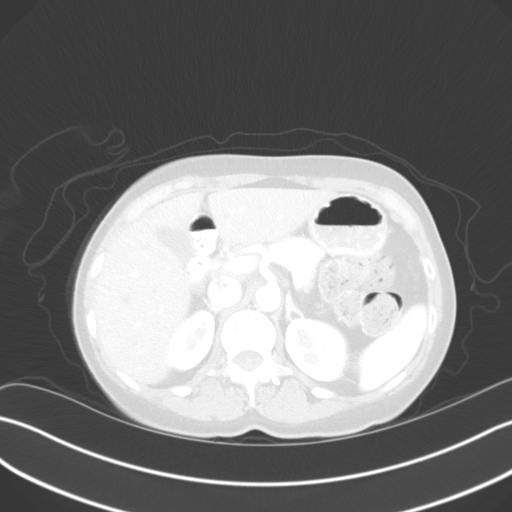
[im 80/126  lung]
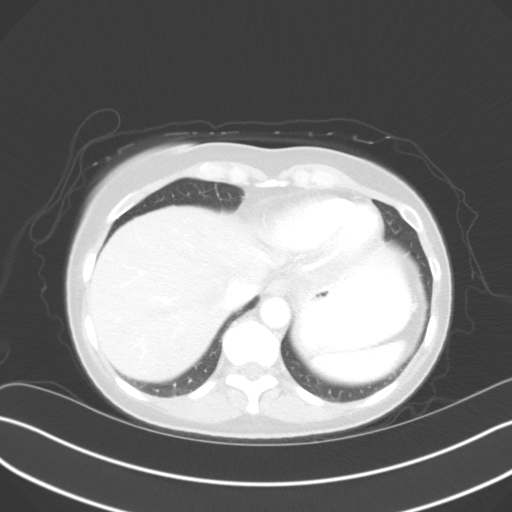
[im 91/126  lung]
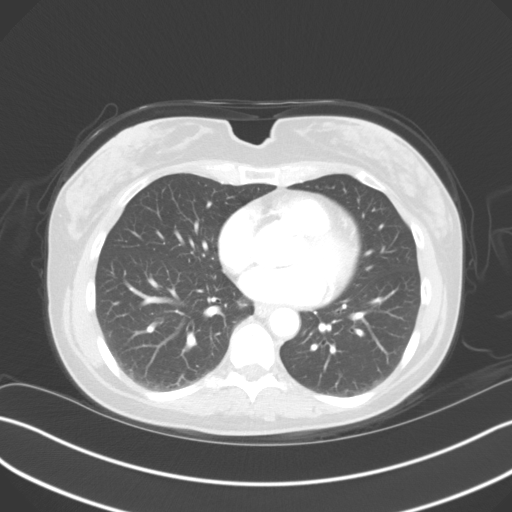
[im 103/126  lung]
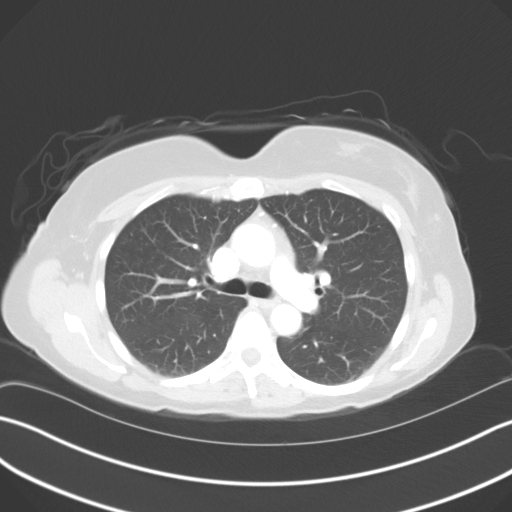
[im 114/126  mediastinal]
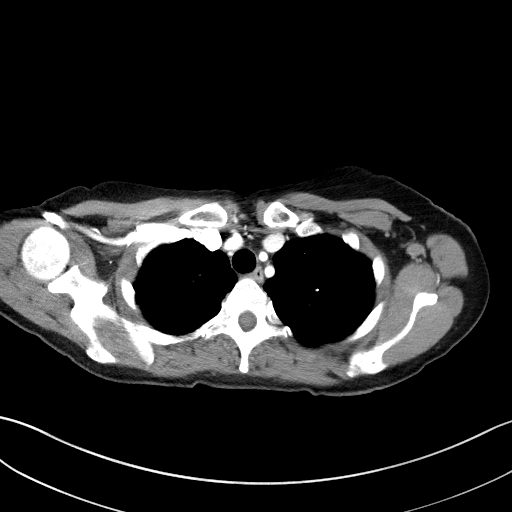
[im 114/126  lung]
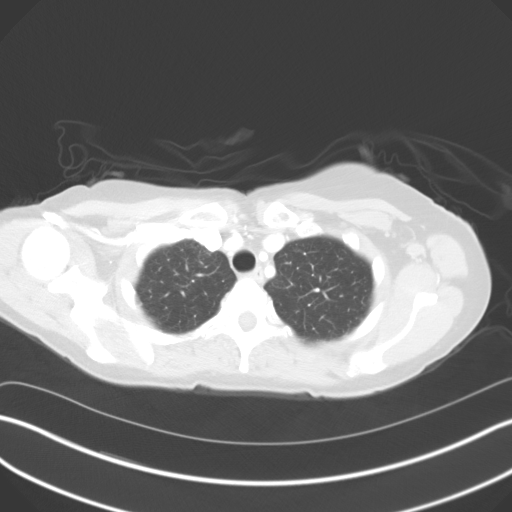

[Series 5: coronals · coronal · 0.64mm/px · 3 of 120 slices shown]
[im 24/120  lung]
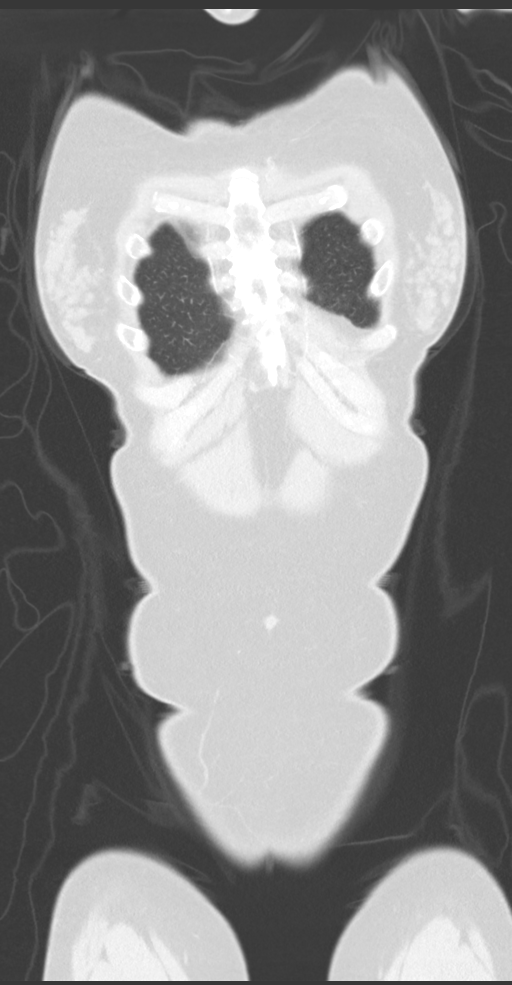
[im 48/120  lung]
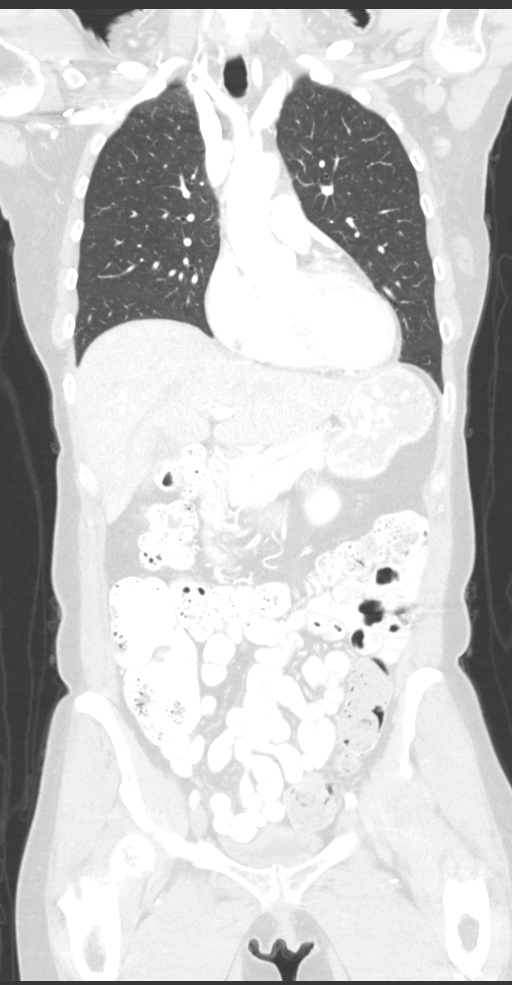
[im 72/120  lung]
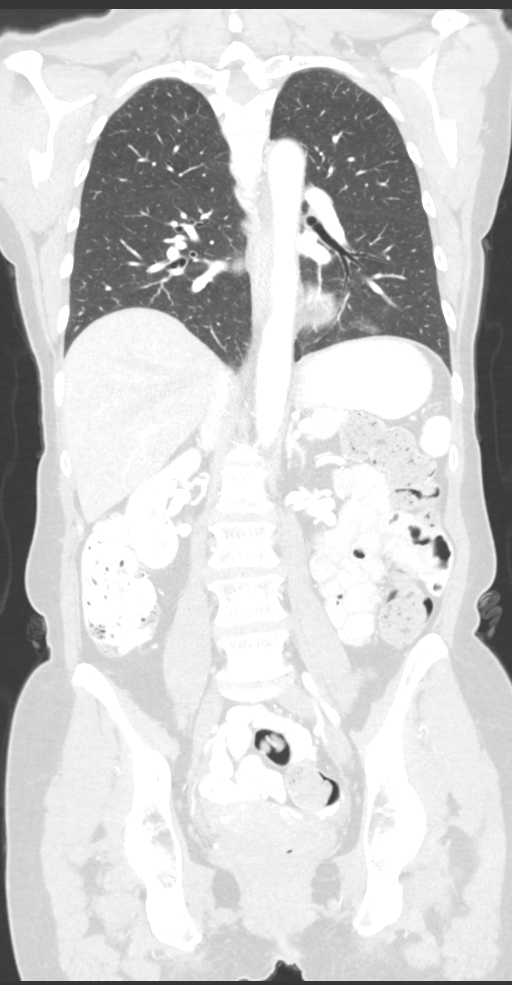

[12 of 36 positions shown; findings below may reference images not displayed]

RADIATION DOSE REDUCTION: This exam was performed according to the
departmental dose-optimization program which includes automated
exposure control, adjustment of the mA and/or kV according to
patient size and/or use of iterative reconstruction technique.

CONTRAST:  100mL OMNIPAQUE IOHEXOL 300 MG/ML  SOLN
FINDINGS: CT CHEST FINDINGS

Cardiovascular: Heart size is normal. There is no significant
pericardial fluid, thickening or pericardial calcification. Aortic
atherosclerosis. No definite coronary artery calcifications.

Mediastinum/Nodes: No pathologically enlarged mediastinal, internal
mammary or hilar lymph nodes. Esophagus is unremarkable in
appearance. Enlarged left axillary lymph nodes (axial image 18 of
series 2) measuring 1 cm in short axis, and in the high left axilla
on axial image 12 of series 2 measuring 1.2 cm in short axis. No
right axillary lymphadenopathy.

Lungs/Pleura: No suspicious appearing pulmonary nodules or masses
are noted. No acute consolidative airspace disease. No pleural
effusions.

Musculoskeletal: In the lateral aspect of the left breast (axial
image 28 of series 2) there is a 1.8 x 1.2 cm partially calcified
nodular area which may correspond to the patient's known breast
neoplasm. There are no aggressive appearing lytic or blastic lesions
noted in the visualized portions of the skeleton.

CT ABDOMEN PELVIS FINDINGS

Hepatobiliary: Diffuse low attenuation throughout the hepatic
parenchyma, suggesting a background of hepatic steatosis (difficult
to confirm on today's contrast enhanced CT examination). No
suspicious cystic or solid hepatic lesions. No intra or extrahepatic
biliary ductal dilatation. Gallbladder is normal in appearance.

Pancreas: No pancreatic mass. No pancreatic ductal dilatation. No
pancreatic or peripancreatic fluid collections or inflammatory
changes.

Spleen: Unremarkable.

Adrenals/Urinary Tract: Bilateral kidneys and bilateral adrenal
glands are normal in appearance. No hydroureteronephrosis. Urinary
bladder is normal in appearance.

Stomach/Bowel: The appearance of the stomach is normal. There is no
pathologic dilatation of small bowel or colon. Normal appendix.

Vascular/Lymphatic: Aortic atherosclerosis, without evidence of
aneurysm or dissection in the abdominal or pelvic vasculature.
Circumaortic left renal vein (normal anatomical variant)
incidentally noted. No lymphadenopathy noted in the abdomen or
pelvis.

Reproductive: Uterus and ovaries are unremarkable in appearance.

Other: No significant volume of ascites.  No pneumoperitoneum.

Musculoskeletal: There are no aggressive appearing lytic or blastic
lesions noted in the visualized portions of the skeleton.
IMPRESSION: 1. Suspicious lesion in the lateral aspect of the left breast which
may correspond to the known breast neoplasm. Notably, today's study
demonstrates 2 enlarged left axillary lymph nodes, as above, highly
concerning for metastatic disease. No other signs of metastatic
disease noted elsewhere in the chest, abdomen or pelvis.
2. Probable hepatic steatosis.
3. Aortic atherosclerosis.

## 2023-04-07 IMAGING — CT CT ABD-PELV W/ CM
2 of 5 series · 12 of 36 positions shown, 15 images · IV contrast (APPLIED)
Comparison: CT of the abdomen and pelvis 04/14/2022. No prior chest
CT.

CLINICAL DATA: 60-year-old female with history of invasive breast
cancer. Follow-up study.

* Tracking Code: BO *
EXAM:
CT CHEST, ABDOMEN, AND PELVIS WITH CONTRAST
TECHNIQUE: Multidetector CT imaging of the chest, abdomen and pelvis was
performed following the standard protocol during bolus
administration of intravenous contrast.

[Series 2: cap with · axial · 0.73mm/px · z∈[-552,-42]mm · 9 of 126 slices shown, 12 images]
[im 12/126  mediastinal]
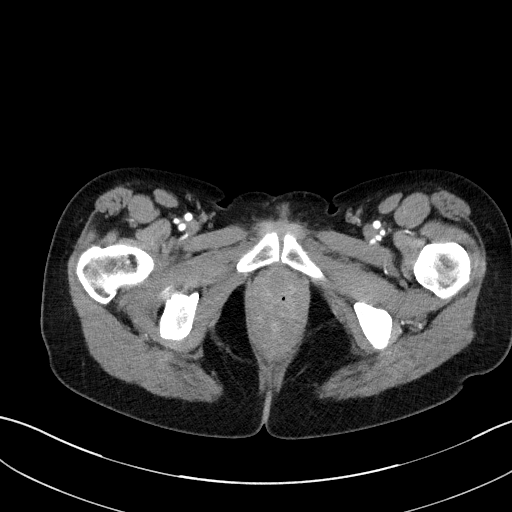
[im 12/126  lung]
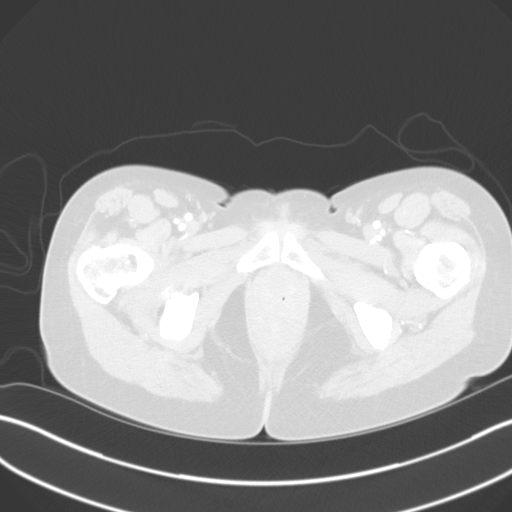
[im 23/126  lung]
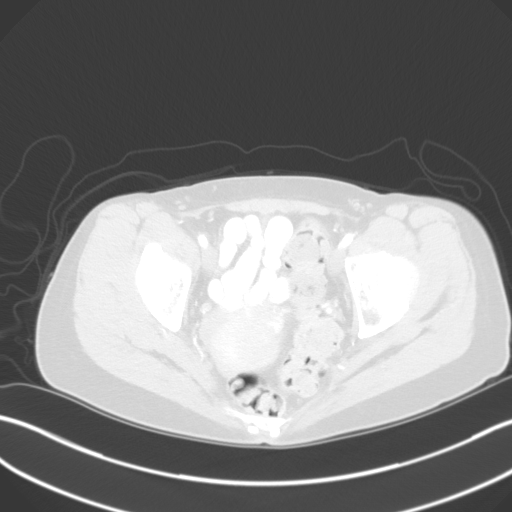
[im 35/126  lung]
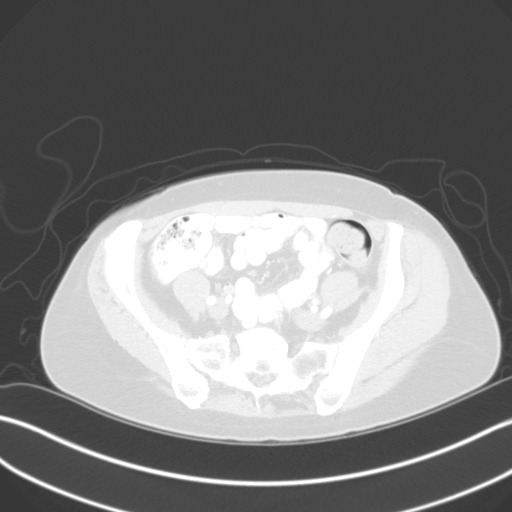
[im 46/126  lung]
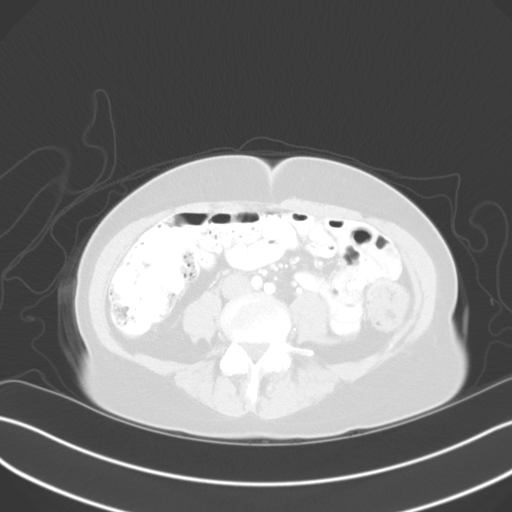
[im 69/126  mediastinal]
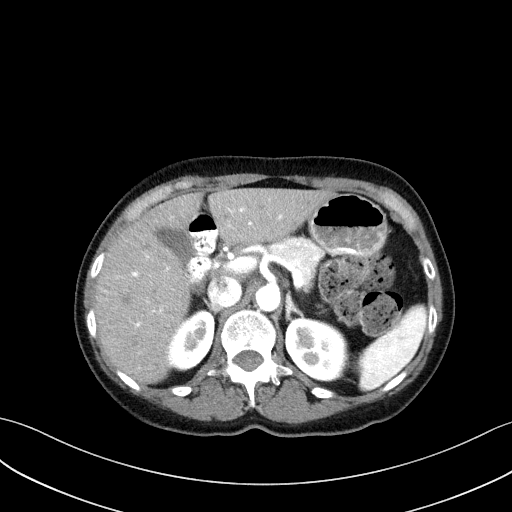
[im 69/126  lung]
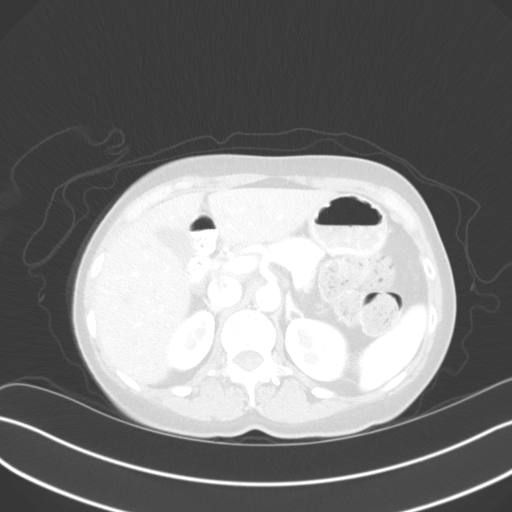
[im 80/126  lung]
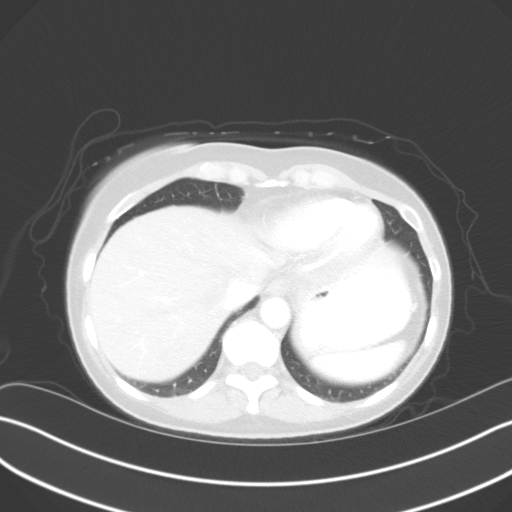
[im 91/126  lung]
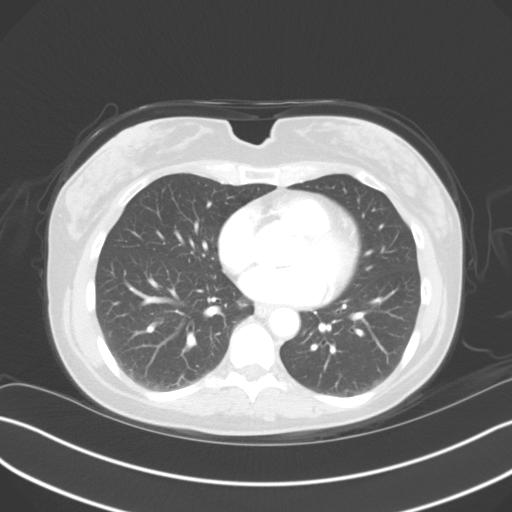
[im 103/126  lung]
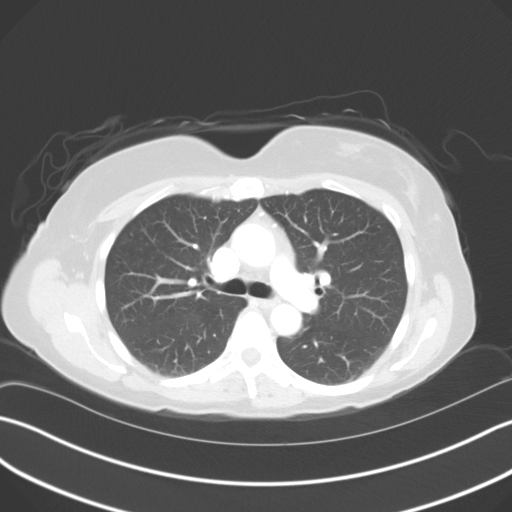
[im 114/126  mediastinal]
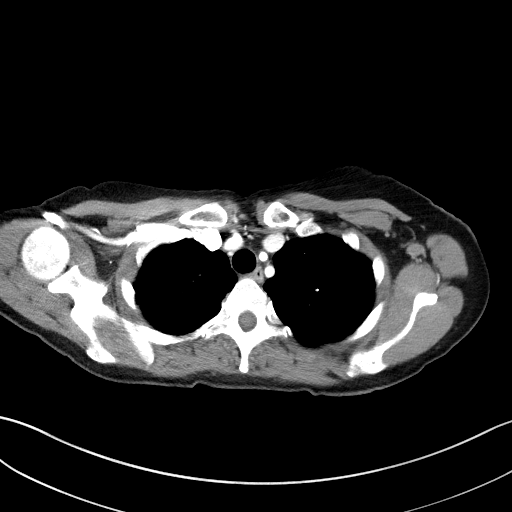
[im 114/126  lung]
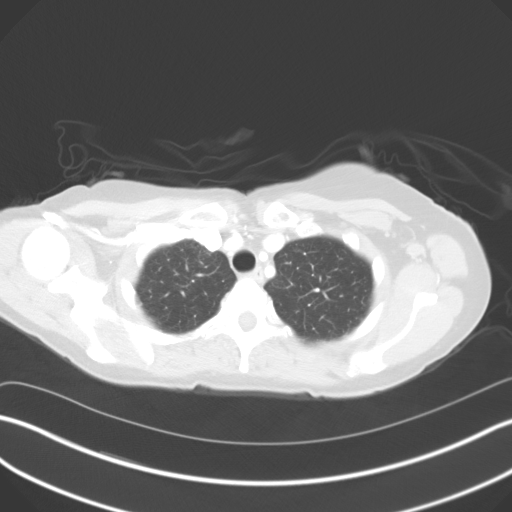

[Series 5: coronals · coronal · 0.64mm/px · 3 of 120 slices shown]
[im 24/120  lung]
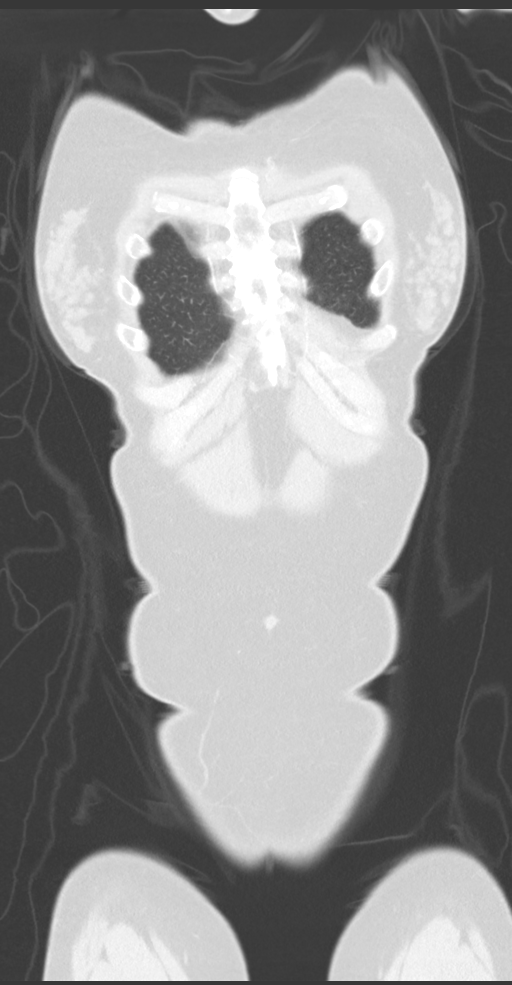
[im 48/120  lung]
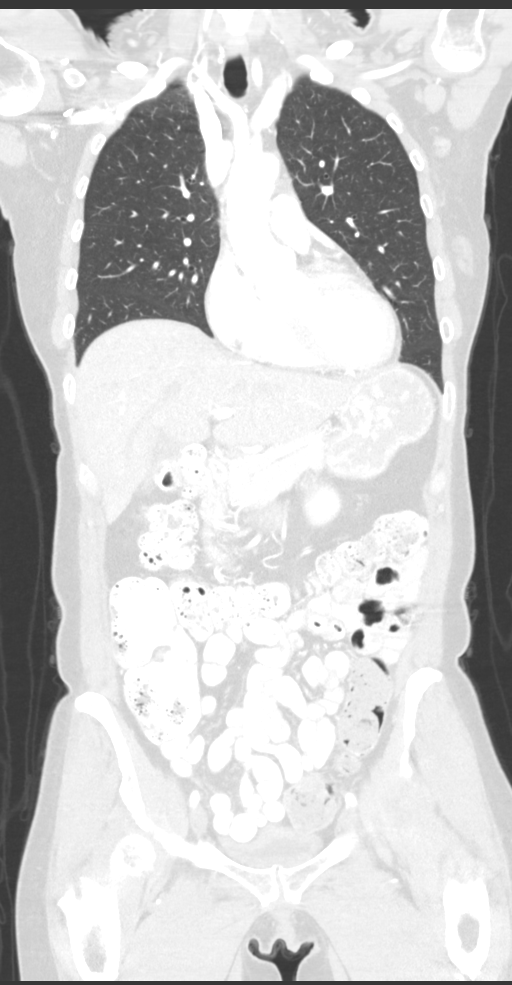
[im 72/120  lung]
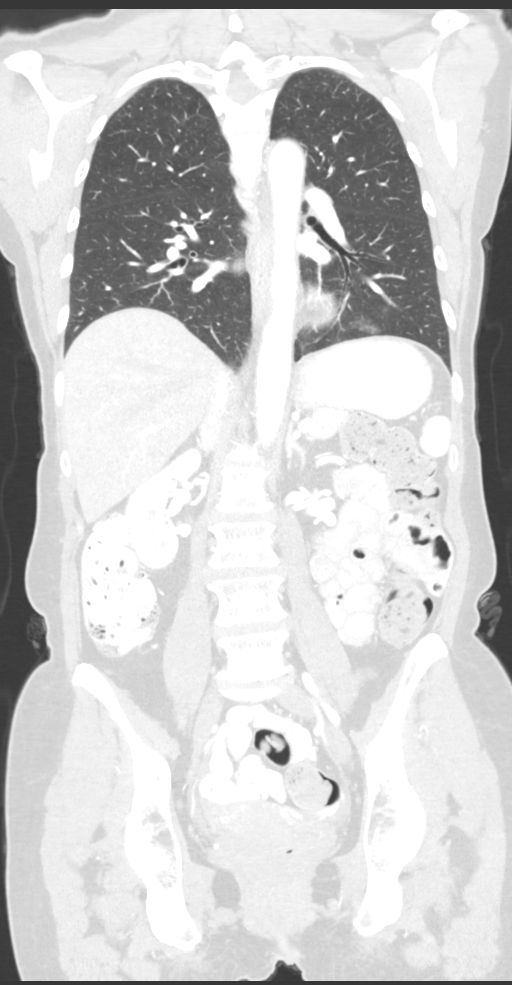

[12 of 36 positions shown; findings below may reference images not displayed]

RADIATION DOSE REDUCTION: This exam was performed according to the
departmental dose-optimization program which includes automated
exposure control, adjustment of the mA and/or kV according to
patient size and/or use of iterative reconstruction technique.

CONTRAST:  100mL OMNIPAQUE IOHEXOL 300 MG/ML  SOLN
FINDINGS: CT CHEST FINDINGS

Cardiovascular: Heart size is normal. There is no significant
pericardial fluid, thickening or pericardial calcification. Aortic
atherosclerosis. No definite coronary artery calcifications.

Mediastinum/Nodes: No pathologically enlarged mediastinal, internal
mammary or hilar lymph nodes. Esophagus is unremarkable in
appearance. Enlarged left axillary lymph nodes (axial image 18 of
series 2) measuring 1 cm in short axis, and in the high left axilla
on axial image 12 of series 2 measuring 1.2 cm in short axis. No
right axillary lymphadenopathy.

Lungs/Pleura: No suspicious appearing pulmonary nodules or masses
are noted. No acute consolidative airspace disease. No pleural
effusions.

Musculoskeletal: In the lateral aspect of the left breast (axial
image 28 of series 2) there is a 1.8 x 1.2 cm partially calcified
nodular area which may correspond to the patient's known breast
neoplasm. There are no aggressive appearing lytic or blastic lesions
noted in the visualized portions of the skeleton.

CT ABDOMEN PELVIS FINDINGS

Hepatobiliary: Diffuse low attenuation throughout the hepatic
parenchyma, suggesting a background of hepatic steatosis (difficult
to confirm on today's contrast enhanced CT examination). No
suspicious cystic or solid hepatic lesions. No intra or extrahepatic
biliary ductal dilatation. Gallbladder is normal in appearance.

Pancreas: No pancreatic mass. No pancreatic ductal dilatation. No
pancreatic or peripancreatic fluid collections or inflammatory
changes.

Spleen: Unremarkable.

Adrenals/Urinary Tract: Bilateral kidneys and bilateral adrenal
glands are normal in appearance. No hydroureteronephrosis. Urinary
bladder is normal in appearance.

Stomach/Bowel: The appearance of the stomach is normal. There is no
pathologic dilatation of small bowel or colon. Normal appendix.

Vascular/Lymphatic: Aortic atherosclerosis, without evidence of
aneurysm or dissection in the abdominal or pelvic vasculature.
Circumaortic left renal vein (normal anatomical variant)
incidentally noted. No lymphadenopathy noted in the abdomen or
pelvis.

Reproductive: Uterus and ovaries are unremarkable in appearance.

Other: No significant volume of ascites.  No pneumoperitoneum.

Musculoskeletal: There are no aggressive appearing lytic or blastic
lesions noted in the visualized portions of the skeleton.
IMPRESSION: 1. Suspicious lesion in the lateral aspect of the left breast which
may correspond to the known breast neoplasm. Notably, today's study
demonstrates 2 enlarged left axillary lymph nodes, as above, highly
concerning for metastatic disease. No other signs of metastatic
disease noted elsewhere in the chest, abdomen or pelvis.
2. Probable hepatic steatosis.
3. Aortic atherosclerosis.

## 2023-04-13 ENCOUNTER — Telehealth: Payer: Self-pay

## 2023-04-13 LAB — SIGNATERA
SIGNATERA MTM READOUT: 0 MTM/ml
SIGNATERA TEST RESULT: NEGATIVE

## 2023-04-13 IMAGING — DX DG CHEST 1V PORT
1 series · 1 of 1 positions shown · non-contrast
Comparison: March 07, 2011.

CLINICAL DATA: Port-A-Cath placement.

EXAM:
PORTABLE CHEST 1 VIEW

[chest ap]
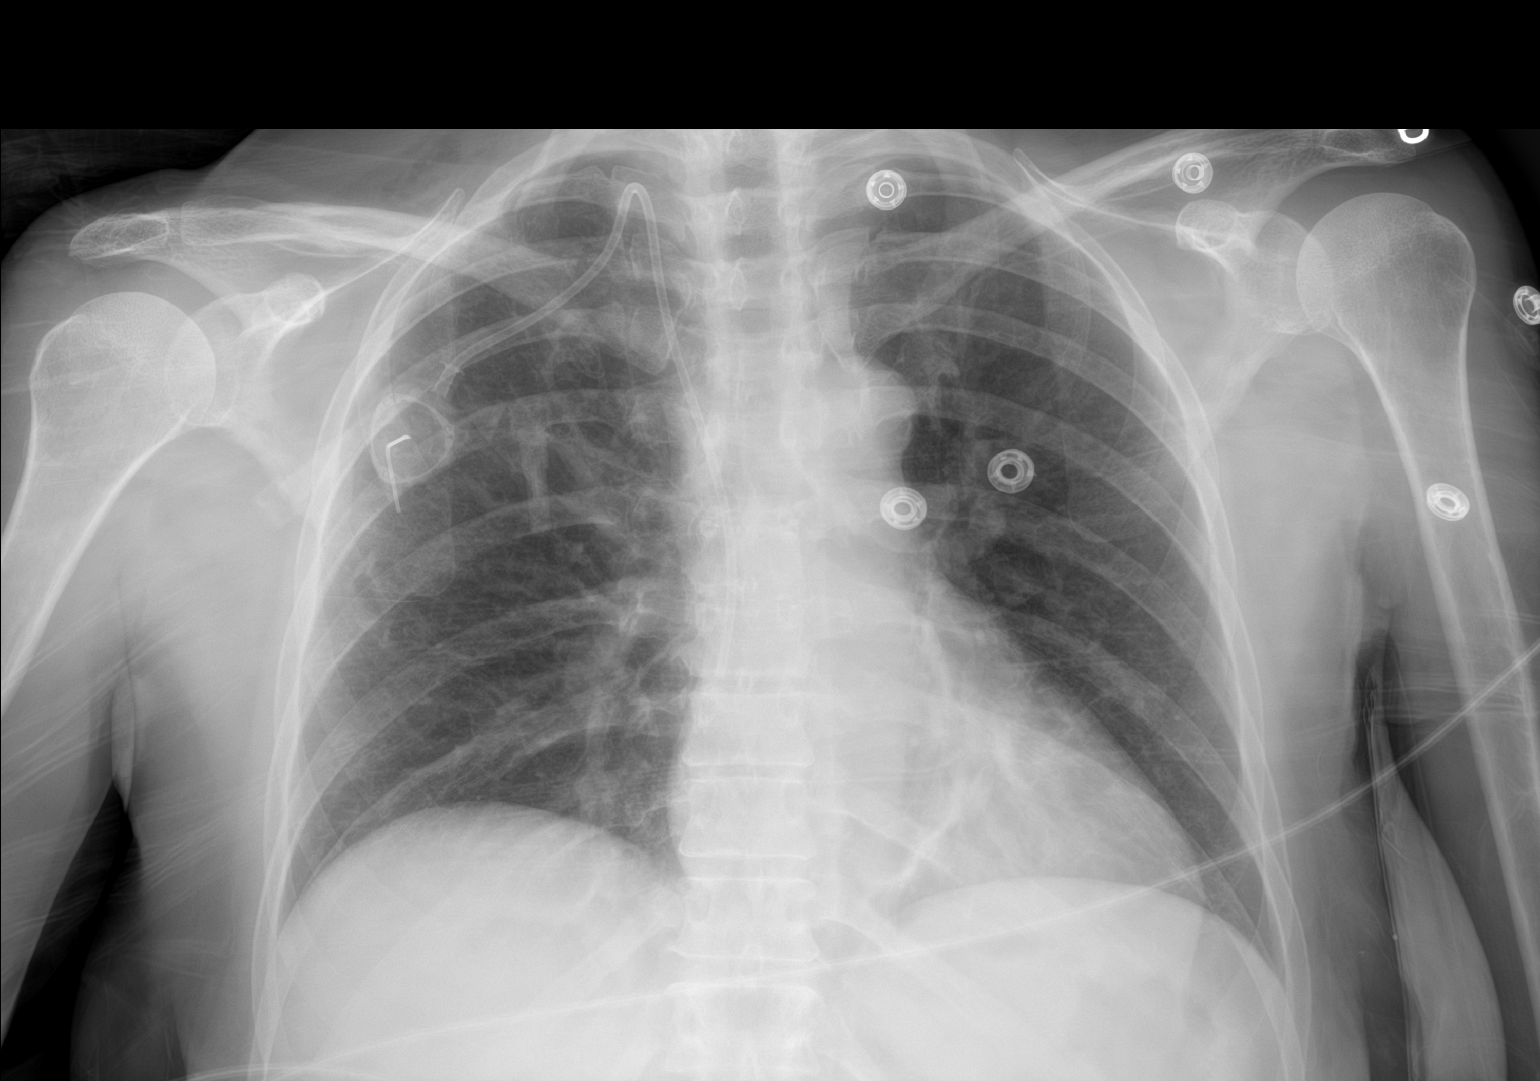

[1 of 1 positions shown; findings below may reference images not displayed]

FINDINGS: Low lung volumes. No consolidation. No visible pleural effusions or
pneumothorax. Cardiomediastinal silhouette is within normal limits.
Right IJ approach Port-A-Cath with the tip projecting at the
superior cavoatrial junction. No displaced fracture. Small amount of
subcutaneous gas in the right supraclavicular region.
IMPRESSION: 1. No evidence of acute cardiopulmonary disease.
2. Right IJ Port-A-Cath with the tip projecting at the superior
cavoatrial junction. No visible pneumothorax on this semi erect
radiograph. Small amount of subcutaneous gas in the right
supraclavicular region, possibly postsurgical. Recommend low
threshold for repeat imaging to exclude developing pneumothorax.

## 2023-04-13 NOTE — Telephone Encounter (Signed)
Called and LVM with negative signatera result. Gave call back number with any questions. 

## 2023-04-16 ENCOUNTER — Ambulatory Visit (HOSPITAL_BASED_OUTPATIENT_CLINIC_OR_DEPARTMENT_OTHER)
Admission: RE | Admit: 2023-04-16 | Discharge: 2023-04-16 | Disposition: A | Discharge status: Home / Self Care | Payer: BC Managed Care – PPO | Source: Ambulatory Visit | Attending: Hematology and Oncology | Admitting: Hematology and Oncology

## 2023-04-16 DIAGNOSIS — Z780 Asymptomatic menopausal state: Secondary | ICD-10-CM | POA: Insufficient documentation

## 2023-04-16 DIAGNOSIS — Z170 Estrogen receptor positive status [ER+]: Secondary | ICD-10-CM | POA: Diagnosis present

## 2023-04-16 DIAGNOSIS — C50412 Malignant neoplasm of upper-outer quadrant of left female breast: Secondary | ICD-10-CM | POA: Insufficient documentation

## 2023-04-18 ENCOUNTER — Ambulatory Visit: Payer: BC Managed Care – PPO | Attending: Hematology and Oncology

## 2023-04-18 DIAGNOSIS — R293 Abnormal posture: Secondary | ICD-10-CM | POA: Diagnosis present

## 2023-04-18 DIAGNOSIS — C50412 Malignant neoplasm of upper-outer quadrant of left female breast: Secondary | ICD-10-CM | POA: Diagnosis present

## 2023-04-18 DIAGNOSIS — M25612 Stiffness of left shoulder, not elsewhere classified: Secondary | ICD-10-CM | POA: Diagnosis present

## 2023-04-18 DIAGNOSIS — Z483 Aftercare following surgery for neoplasm: Secondary | ICD-10-CM | POA: Diagnosis present

## 2023-04-18 DIAGNOSIS — I890 Lymphedema, not elsewhere classified: Secondary | ICD-10-CM | POA: Insufficient documentation

## 2023-04-18 NOTE — Therapy (Signed)
OUTPATIENT PHYSICAL THERAPY  UPPER EXTREMITY ONCOLOGY TREATMENT  Patient Name: ZIANN SKUBIC MRN: [id number] DOB:01-10-1961, 62 y.o., female Today's Date: 04/18/2023  END OF SESSION:  PT End of Session - 04/18/23 1503     Visit Number 13    Number of Visits 20    Date for PT Re-Evaluation 05/03/23    PT Start Time 1503    PT Stop Time 1554    PT Time Calculation (min) 51 min    Activity Tolerance Patient tolerated treatment well    Behavior During Therapy [facility] for tasks assessed/performed             Past Medical History:  Diagnosis Date   Anxiety    Arthritis    Cancer (HCC) 03/2022   L breast   Depression    Family history of breast cancer 05/15/2022   Family history of pancreatic cancer 05/15/2022   Family history of prostate cancer 05/15/2022   GERD (gastroesophageal reflux disease)    History of hiatal hernia    patient believes she may have been told she has this years ago   Past Surgical History:  Procedure Laterality Date   BREAST BIOPSY  09/19/2022   MM LT RADIOACTIVE SEED EA ADD LESION LOC MAMMO GUIDE 09/19/2022 [facility]   BREAST BIOPSY  09/19/2022   MM LT RADIOACTIVE SEED LOC MAMMO GUIDE 09/19/2022 [facility]   BREAST LUMPECTOMY WITH RADIOACTIVE SEED AND AXILLARY LYMPH NODE DISSECTION Left 09/20/2022   Procedure: LEFT BREAST SEED LUMPECTOMY, LEFT AXILLARY LYMPH NODE DISSECTION;  Surgeon: Harriette Bouillon, MD;  Location: [facility] OR;  Service: General;  Laterality: Left;  GEN & PEC BLOCK   CERVIX SURGERY     Laser removal of pre-cancer cells   LASIK Bilateral    PORTACATH PLACEMENT Right 04/20/2022   Procedure: PORT PLACEMENT;  Surgeon: Harriette Bouillon, MD;  Location: [facility] OR;  Service: General;  Laterality: Right;   Patient Active Problem List   Diagnosis Date Noted   Other fatigue 08/28/2022   Acute pain 08/28/2022   Genetic testing 06/01/2022   Family history of breast cancer 05/15/2022   Family history of pancreatic cancer 05/15/2022    Family history of prostate cancer 05/15/2022   Port-A-Cath in place 04/28/2022   Malignant neoplasm of upper-outer quadrant of left breast in female, estrogen receptor positive (HCC) 03/29/2022      REFERRING PROVIDER: [name], MD  REFERRING DIAG: Left breast swelling/tenderness  THERAPY DIAG:  Malignant neoplasm of upper-outer quadrant of left female breast, unspecified estrogen receptor status (HCC)  Stiffness of left shoulder, not elsewhere classified  Lymphedema of breast  Aftercare following surgery for neoplasm  Abnormal posture  ONSET DATE: Early March  Rationale for Evaluation and Treatment: Rehabilitation  SUBJECTIVE:  SUBJECTIVE STATEMENT:  Nose bleeds have stopped. Last week my arm felt really good.  This week my arm is worse and my posture hasn't been as good. Cording seems to be tighter and the back of my shoulder is hurting too.  PERTINENT HISTORY:  03/14/2022:Screening detected left breast masses 1.5 cm and 1.3 cm with enlarged left axillary lymph node, biopsy revealed grade 3 IDC with lymph node being positive, ER 95%, PR 0%, HER2 2+ by IHC and FISH positive with a ratio 2.45 and a copy #4.9, the second biopsy was HER2 negative with a ratio  1. Neoadjuvant chemotherapy with Herceptin Perjeta and letrozole for 8 cycles.  (Our original recommendation is TCHP x6 cycles but patient is not willing to go through chemo) 2. left lumpectomy and ALND 09/20/2022: 0.6 cm IDC 2/14 lymph nodes, margins negative, ER 95%, PR 0%, HER2 positive 3. Followed by adjuvant radiation therapy  4.  Adjuvant antiestrogen therapy with neratinib She had left lumpectomy on 09/20/2022 with 2/14 LN's    PAIN:  Are you having pain? Yes, my neck, back,  NPRS scale:2/10 at rest, 4/10 left neck/UT,  breast  tenderness 4/10, axilla 4/10, back 5/10 Pain location: LEFT Pain orientation: Left  PAIN TYPE: tight and sore Pain description: constant  Aggravating factors: being hugged, reaching, lying on left side Relieving factors: Compression?  PRECAUTIONS: Left UE lymphedema precautions  WEIGHT BEARING RESTRICTIONS: No  FALLS:  Has patient fallen in last 6 months? 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  SENSATION: Light touch: Deficits     POSTURE: forward head, rounded shoulders  UPPER EXTREMITY AROM/PROM:  A/PROM RIGHT   eval   Shoulder extension 42  Shoulder flexion 163  Shoulder abduction 179  Shoulder internal rotation 58  Shoulder external rotation 88    (Blank rows = not tested)  A/PROM LEFT   eval LEFT 03/13/2023 LEFT 04/18/23  Shoulder extension 37  50  Shoulder flexion 144 159 160  Shoulder abduction 140 168 175  Shoulder internal rotation 56    Shoulder external rotation 84      (Blank rows = not tested)  CERVICAL AROM: All within normal limits:     UPPER EXTREMITY STRENGTH:   LYMPHEDEMA ASSESSMENTS:   SURGERY TYPE/DATE: Left lumpectomy,09/20/2022  NUMBER OF LYMPH NODES REMOVED: 2+/14  CHEMOTHERAPY: No at her request, Did Herceptin, Perjeta and  letrozole  RADIATION:yes but stopped early at her request  HORMONE TREATMENT: YES  INFECTIONS: NO   LYMPHEDEMA ASSESSMENTS:   LANDMARK RIGHT  eval  At axilla    15 cm proximal to olecranon process   10 cm proximal to olecranon process   Olecranon process   15 cm proximal to ulnar styloid process   10 cm proximal to ulnar styloid process   Just proximal to ulnar styloid process   Across hand at thumb web space   At base  of 2nd digit   (Blank rows = not tested)  LANDMARK LEFT  eval  At axilla    15 cm proximal to olecranon process   10 cm proximal to olecranon process   Olecranon process   15 cm proximal to ulnar styloid process   10 cm proximal to ulnar styloid process   Just proximal to ulnar styloid process   Across hand at thumb web space   At base of 2nd digit   (Blank rows = not tested)    L-DEX LYMPHEDEMA SCREENING: The patient was assessed using the L-Dex machine today to produce a lymphedema index baseline score. The patient will be reassessed on a regular basis (typically every 3 months) to obtain new L-Dex scores. If the score is > 6.5 points away from his/her baseline score indicating onset of subclinical lymphedema, it will be recommended to wear a compression garment for 4 weeks, 12 hours per day and then be reassessed. If the score continues to be > 6.5 points from baseline at reassessment, we will initiate lymphedema treatment. Assessing in this manner has a 95% rate of preventing clinically significant lymphedema.   BREAST COMPLAINTS QUESTIONNAIRE(EVAL) Pain:8 Heaviness:8 Swollen feeling:9 Tense Skin:8 Redness:6 Bra Print:9 Size of Pores:5 Hard feeling: 6 Total:   59  /80 A Score over 9 indicates lymphedema issues in the breast  BREAST COMPLAINTS QUESTIONNAIRE(04/05/2023) Pain:5 Heaviness:4 Swollen feeling:8 Tense Skin:5 Redness:2 Bra Print:9 Size of Po9res: Hard feeling7:  Total:   49  /80 A Score over 9 indicates lymphedema issues in  the breast    TODAY'S TREATMENT:                                                                                                                                          DATE:  Pt permission and consent throughout each step of examination and treatment with modification and draping if requested when working on sensitive areas  04/18/2023 MFR to left UE cording at axilla, upper arm and forearm STM left UT,pectorals/lateral trunk and in SL to left scapular with cupping to left pectorals and scapular region PROM left shoulder flexion, scaption, abduction, ER Half foam roll: Bilateral flexion, scaption horizontal abd x 5 Open book x 5, LTR x 5 with arms outstretched Measured AROM  04/05/2023 MFR to left UE cording at axilla, upper arm and forearm STM left UT,pectorals/lateral trunk PROM left shoulder flexion, scaption, abduction, ER MLD to left breast;Short neck, 5 diaphragmatic breaths, R axillary nodes and establishment of interaxillary pathway, L inguinal nodes and establishment of axilloinguinal pathway, then L breast moving fluid towards pathways spending extra time in any areas of fibrosis then retracing all steps Checked goals for PN  03/28/2023 [facility] screen done and  is well within normal limits; next 3 month screen set up MFR to left UE cording at axilla, upper arm and forearm PROM left shoulder flexion, scaption, abduction, ER  MFR bilateral pectoral regions MLD to left breast;Short neck, 5 diaphragmatic breaths, R axillary nodes and establishment of interaxillary pathway, L inguinal nodes and establishment of axilloinguinal pathway, then L breast moving fluid towards pathways spending extra time in any areas of fibrosis then retracing all steps  03/26/2023 STM left UT, pectorals, lateral trunk with cocoa butter, and SL to left scapular region with STM and cupping MFR to left UE cording at axilla, upper arm and forearm PROM left shoulder flexion, scaption, abduction, ER Snow angels  x 10 03/22/2023 STM left UT, pectorals, lateral trunk with cocoa butter, and SL to left scapular region with STM and cupping PROM left shoulder flexion, scaption, abduction, ER On half foam roll; bilateral flexion, scaption, horizontal abduction, snow angel x 5 Snow angels x 10 on table PTperformed MLD to left breast;Short neck, 5 diaphragmatic breaths, R axillary nodes and establishment of interaxillary pathway, L inguinal nodes and establishment of axilloinguinal pathway, then L breast moving fluid towards pathways spending extra time in any areas of fibrosis then retracing all steps.   03/20/2023 STM left UT, pectorals, lateral trunk with cocoa butter, and SL to left scapular region with STM and cupping PROM left shoulder flexion, scaption, abduction MFR to left axilla and upper arm area of cording in D2 fleion PTperformed MLD to left breast;Short neck, 5 diaphragmatic breaths, R axillary nodes and establishment of interaxillary pathway, L inguinal nodes and establishment of axilloinguinal pathway, then L breast moving fluid towards pathways spending extra time in any areas of fibrosis then retracing all steps. 03/15/2023 STM left UT, pectorals, lateral trunk with cocoa butter, and SL to left scapular region with STM and cupping Supine wand x 5 ea PROM left shoulder flexion, scaption, abduction MFR to left axilla and upper arm area of cording in D2 fleion Lower trunk rotation x 5 arms outstretched and goal post arms Supine yellow band flexion, horizontal abduction x 5-7 reps Standing lat stretch x 3 Pt advised to place foam pad in bra during the day     03/13/2023 STM left UT, pectorals, lateral trunk and left lower rib cage with cocoa butter, and SL to left scapular region with STM and cupping PROM left shoulder flexion, scaption, abduction MFR to left axilla n supine: PTperformed with instruction to pt.Short neck, 5 diaphragmatic breaths, R axillary nodes and establishment of  interaxillary pathway, L inguinal nodes and establishment of axilloinguinal pathway, then L breast moving fluid towards pathways spending extra time in any areas of fibrosis then retracing all steps. Measured Left AROM  03/08/2023  STM left UT, pectorals, lateral trunk and left lower rib cage with cocoa butter, and SL to left scapular region with STM and cupping Supine wand flexion and scaption x 5 PROM left shoulder flexion, scaption, abduction MFR to left axilla and left upper arm and forearm area of cording Gray foam pad in TG soft made to pad rib area from bra 03/05/2023 STM left UT, pectorals, lateral trunk and in SL to left scapular area and lats with cocoa butter Supine wand flexion and scaption x 5 PROM left shoulder flexion, scaption, abduction MFR to left axilla and left upper arm and forearm area of cording n supine: PTperformed with instruction to pt.Short neck, 5 diaphragmatic breaths, R axillary nodes and establishment of interaxillary pathway, L inguinal nodes and establishment of axilloinguinal pathway, then L breast moving fluid towards pathways spending extra time in any areas of fibrosis then retracing all steps.    03/02/2023 Peach dot pad made  to put in lower breast area of enlarged pores In supine: PTperformed with instruction to pt.Short neck, 5 diaphragmatic breaths, R axillary nodes and establishment of interaxillary pathway, L inguinal nodes and establishment of axilloinguinal pathway, then L breast moving fluid towards pathways spending extra time in any areas of fibrosis then retracing all steps. Pt then performed all steps with therapist observing and assisting with VC's and TC's prn Assessed left upper arm/forearm;several small cords felt. Therapist performed MFR to forearm and upper arm. Showed pt how to do NTS on herself.   02/27/2023 STM left UT, pectorals, lateral trunk and in SL to left scapular area and lats with cocoa butter Supine wand flexion and scaption  x 5 PROM left shoulder flexion, scaption, abduction MFR to left axilla LTR x 5 with arms outstretched Scar massage left lateral breast. Doorway pectoral stretch x 3, wall stretch b x 3 Standing lat stretch x 2     PATIENT EDUCATION: 02/20/2022 Education details: Discussed POC, Left UQ tightness and limitations in ROM, breast swelling, compression bra, MLD Person educated: Patient Education method: Explanation Education comprehension: verbalized understanding  HOME EXERCISE PROGRAM: None given; will resume 4 post of exercises and hold on band exercises right now.  ASSESSMENT:  CLINICAL IMPRESSION: Pt has made good improvement with left shoulder AROM. This week she had return of posterior shoulder pain with symptoms possibly related to posterior shoulder impingement. One very tight cord noted in axilla today and tight throughout forearm, but not really palpable in forearm  OBJECTIVE IMPAIRMENTS: decreased activity tolerance, decreased knowledge of condition, decreased ROM, increased edema, increased fascial restrictions, impaired UE functional use, postural dysfunction, and pain.   ACTIVITY LIMITATIONS: sleeping and reach over head  PARTICIPATION LIMITATIONS: interpersonal relationship  PERSONAL FACTORS: 3+ comorbidities: Left breast cancer s/p infusions and radiation  are also affecting patient's functional outcome.   REHAB POTENTIAL: Excellent  CLINICAL DECISION MAKING: Stable/uncomplicated  EVALUATION COMPLEXITY: Low  GOALS: Goals reviewed with patient? Yes  SHORT TERM GOALS= LONG TERM GOALS: Target date: 04/04/2023  Pt will be independent in MLD for reducing left breast edema Baseline: Goal status: MET 04/05/2023  2.  Pt will note decreased breast swelling/tenderness by atleast 50% Baseline:  Goal status:ONGOING 3.  Pt will be able to lie on her left side to sleep for short periods of time Baseline:  Goal status:MET 04/05/2023 4.  Pt will have left shoulder ROM WNL  without increased pain with reaching Baseline:  Goal status:ONGOING  5.  Breast complaints survey will be reduced to no greater than 9 Baseline:  Goal status: Ongoing PLAN:  PT FREQUENCY: 2x/week  PT DURATION: 4 weeks  PLANNED INTERVENTIONS: Therapeutic exercises, Therapeutic activity, Patient/Family education, Self Care, Joint mobilization, Orthotic/Fit training, Manual lymph drainage, scar mobilization, Vasopneumatic device, Manual therapy, and Re-evaluation  PLAN FOR NEXT SESSION:Half foam roll, Flexi? how was peach foam for bra?, review L. breast MLD,  MFR to cording,STM to left UQ, PROM left shoulder, LTR with arms outstretched,  MLD to left breast and review with pt., update HEP prn.  Waynette Buttery, PT 04/18/2023, 5:25 PM

## 2023-04-19 ENCOUNTER — Encounter: Payer: Self-pay | Admitting: *Deleted

## 2023-04-20 ENCOUNTER — Ambulatory Visit: Payer: BC Managed Care – PPO

## 2023-04-20 ENCOUNTER — Ambulatory Visit: Payer: BC Managed Care – PPO | Admitting: Hematology and Oncology

## 2023-04-20 ENCOUNTER — Other Ambulatory Visit: Payer: BC Managed Care – PPO

## 2023-04-24 ENCOUNTER — Encounter: Payer: Self-pay | Admitting: Adult Health

## 2023-04-24 ENCOUNTER — Inpatient Hospital Stay: Payer: BC Managed Care – PPO | Attending: Hematology and Oncology

## 2023-04-24 ENCOUNTER — Other Ambulatory Visit: Payer: Self-pay

## 2023-04-24 ENCOUNTER — Inpatient Hospital Stay: Payer: BC Managed Care – PPO

## 2023-04-24 ENCOUNTER — Inpatient Hospital Stay (HOSPITAL_BASED_OUTPATIENT_CLINIC_OR_DEPARTMENT_OTHER): Payer: BC Managed Care – PPO | Admitting: Adult Health

## 2023-04-24 VITALS — BP 138/67 | HR 70 | Resp 17

## 2023-04-24 DIAGNOSIS — Z5111 Encounter for antineoplastic chemotherapy: Secondary | ICD-10-CM | POA: Insufficient documentation

## 2023-04-24 DIAGNOSIS — Z170 Estrogen receptor positive status [ER+]: Secondary | ICD-10-CM | POA: Insufficient documentation

## 2023-04-24 DIAGNOSIS — C50412 Malignant neoplasm of upper-outer quadrant of left female breast: Secondary | ICD-10-CM

## 2023-04-24 DIAGNOSIS — Z95828 Presence of other vascular implants and grafts: Secondary | ICD-10-CM

## 2023-04-24 DIAGNOSIS — Z79811 Long term (current) use of aromatase inhibitors: Secondary | ICD-10-CM | POA: Insufficient documentation

## 2023-04-24 DIAGNOSIS — Z87891 Personal history of nicotine dependence: Secondary | ICD-10-CM | POA: Diagnosis not present

## 2023-04-24 LAB — CBC WITH DIFFERENTIAL (CANCER CENTER ONLY)
Abs Immature Granulocytes: 0.01 10*3/uL (ref 0.00–0.07)
Basophils Absolute: 0 10*3/uL (ref 0.0–0.1)
Basophils Relative: 1 %
Eosinophils Absolute: 0.1 10*3/uL (ref 0.0–0.5)
Eosinophils Relative: 4 %
HCT: 39.8 % (ref 36.0–46.0)
Hemoglobin: 13.2 g/dL (ref 12.0–15.0)
Immature Granulocytes: 0 %
Lymphocytes Relative: 30 %
Lymphs Abs: 0.9 10*3/uL (ref 0.7–4.0)
MCH: 27.3 pg (ref 26.0–34.0)
MCHC: 33.2 g/dL (ref 30.0–36.0)
MCV: 82.2 fL (ref 80.0–100.0)
Monocytes Absolute: 0.4 10*3/uL (ref 0.1–1.0)
Monocytes Relative: 13 %
Neutro Abs: 1.5 10*3/uL — ABNORMAL LOW (ref 1.7–7.7)
Neutrophils Relative %: 52 %
Platelet Count: 171 10*3/uL (ref 150–400)
RBC: 4.84 MIL/uL (ref 3.87–5.11)
RDW: 15.3 % (ref 11.5–15.5)
WBC Count: 2.9 10*3/uL — ABNORMAL LOW (ref 4.0–10.5)
nRBC: 0 % (ref 0.0–0.2)

## 2023-04-24 LAB — CMP (CANCER CENTER ONLY)
ALT: 31 U/L (ref 0–44)
AST: 37 U/L (ref 15–41)
Albumin: 4.5 g/dL (ref 3.5–5.0)
Alkaline Phosphatase: 61 U/L (ref 38–126)
Anion gap: 5 (ref 5–15)
BUN: 12 mg/dL (ref 6–20)
CO2: 28 mmol/L (ref 22–32)
Calcium: 10 mg/dL (ref 8.9–10.3)
Chloride: 103 mmol/L (ref 98–111)
Creatinine: 0.68 mg/dL (ref 0.44–1.00)
GFR, Estimated: >60 mL/min (ref >60)
Glucose, Bld: 99 mg/dL (ref 70–99)
Potassium: 4 mmol/L (ref 3.5–5.1)
Sodium: 136 mmol/L (ref 135–145)
Total Bilirubin: 0.4 mg/dL (ref 0.3–1.2)
Total Protein: 7.5 g/dL (ref 6.5–8.1)

## 2023-04-24 MED ORDER — PROCHLORPERAZINE MALEATE 10 MG PO TABS: 10.0000 mg | ORAL_TABLET | Freq: Once | ORAL | Status: DC

## 2023-04-24 MED ORDER — DIPHENHYDRAMINE HCL 25 MG PO CAPS
25.0000 mg | ORAL_CAPSULE | Freq: Once | ORAL | Status: AC
  Administered 2023-04-24: 25 mg via ORAL
  Filled 2023-04-24: qty 1

## 2023-04-24 MED ORDER — SODIUM CHLORIDE 0.9% FLUSH
10.0000 mL | INTRAVENOUS | Status: DC | PRN
  Administered 2023-04-24: 10 mL

## 2023-04-24 MED ORDER — SODIUM CHLORIDE 0.9 % IV SOLN
3.0000 mg/kg | Freq: Once | INTRAVENOUS | Status: AC
  Administered 2023-04-24: 160 mg via INTRAVENOUS
  Filled 2023-04-24: qty 8

## 2023-04-24 MED ORDER — HEPARIN SOD (PORK) LOCK FLUSH 100 UNIT/ML IV SOLN
500.0000 [IU] | Freq: Once | INTRAVENOUS | Status: AC | PRN
  Administered 2023-04-24: 500 [IU]

## 2023-04-24 MED ORDER — SODIUM CHLORIDE 0.9% FLUSH
10.0000 mL | Freq: Once | INTRAVENOUS | Status: AC
  Administered 2023-04-24: 10 mL

## 2023-04-24 MED ORDER — SODIUM CHLORIDE 0.9 % IV SOLN: Freq: Once | INTRAVENOUS | Status: AC

## 2023-04-24 NOTE — Patient Instructions (Signed)
[facility]  Discharge Instructions: Thank you for choosing [facility] to provide your oncology and hematology care.   If you have a lab appointment with the Cancer Center, please go directly to the Cancer Center and check in at the registration area.   Wear comfortable clothing and clothing appropriate for easy access to any Portacath or PICC line.   We strive to give you quality time with your provider. You may need to reschedule your appointment if you arrive late (15 or more minutes).  Arriving late affects you and other patients whose appointments are after yours.  Also, if you miss three or more appointments without notifying the office, you may be dismissed from the clinic at the provider's discretion.      For prescription refill requests, have your pharmacy contact our office and allow 72 hours for refills to be completed.    Today you received the following chemotherapy and/or immunotherapy agents: Kadcyla.       To help prevent nausea and vomiting after your treatment, we encourage you to take your nausea medication as directed.  BELOW ARE SYMPTOMS THAT SHOULD BE REPORTED IMMEDIATELY: *FEVER GREATER THAN 100.4 F (38 C) OR HIGHER *CHILLS OR SWEATING *NAUSEA AND VOMITING THAT IS NOT CONTROLLED WITH YOUR NAUSEA MEDICATION *UNUSUAL SHORTNESS OF BREATH *UNUSUAL BRUISING OR BLEEDING *URINARY PROBLEMS (pain or burning when urinating, or frequent urination) *BOWEL PROBLEMS (unusual diarrhea, constipation, pain near the anus) TENDERNESS IN MOUTH AND THROAT WITH OR WITHOUT PRESENCE OF ULCERS (sore throat, sores in mouth, or a toothache) UNUSUAL RASH, SWELLING OR PAIN  UNUSUAL VAGINAL DISCHARGE OR ITCHING   Items with * indicate a potential emergency and should be followed up as soon as possible or go to the Emergency Department if any problems should occur.  Please show the CHEMOTHERAPY ALERT CARD or IMMUNOTHERAPY ALERT CARD at  check-in to the Emergency Department and triage nurse.  Should you have questions after your visit or need to cancel or reschedule your appointment, please contact [facility]  Dept: [phone]  and follow the prompts.  Office hours are 8:00 a.m. to 4:30 p.m. Monday - Friday. Please note that voicemails left after 4:00 p.m. may not be returned until the following business day.  We are closed weekends and major holidays. You have access to a nurse at all times for urgent questions. Please call the main number to the clinic Dept: [phone] and follow the prompts.   For any non-urgent questions, you may also contact your provider using MyChart. We now offer e-Visits for anyone 18 and older to request care online for non-urgent symptoms. For details visit [email or web address].   Also download the MyChart app! Go to the app store, search "MyChart", open the app, select [facility], and log in with your MyChart username and password.   

## 2023-04-24 NOTE — Progress Notes (Signed)
Pt declined to stay full 30 min post obs, observed for 10 min, discharged with VSS, ambulatory to lobby.

## 2023-04-24 NOTE — Progress Notes (Signed)
[facility] Cancer Follow up:    Carlean Jews, NP [address] [place] [address]   DIAGNOSIS:  Cancer Staging  Malignant neoplasm of upper-outer quadrant of left breast in female, estrogen receptor positive (HCC) Staging form: Breast, AJCC 8th Edition - Clinical: Stage IIA (cT1c, cN1, cM0, G3, ER+, PR-, HER2+) - Signed by [name], MD on 03/29/2022 Stage prefix: Initial diagnosis Histologic grading system: 3 grade system   SUMMARY OF ONCOLOGIC HISTORY: Oncology History  Malignant neoplasm of upper-outer quadrant of left breast in female, estrogen receptor positive (HCC)  03/14/2022 Initial Diagnosis   Screening detected left breast masses 1.5 cm and 1.3 cm with enlarged left axillary lymph node, biopsy revealed grade 3 IDC with lymph node being positive, ER 95%, PR 0%, HER2 2+ by IHC and FISH positive with a ratio 2.45 and a copy #4.9, the second biopsy was HER2 negative with a ratio 2.15 and a copy #3.55   03/29/2022 Cancer Staging   Staging form: Breast, AJCC 8th Edition - Clinical: Stage IIA (cT1c, cN1, cM0, G3, ER+, PR-, HER2+) - Signed by [name], MD on 03/29/2022 Stage prefix: Initial diagnosis Histologic grading system: 3 grade system   03/29/2022 - 09/2022 Anti-estrogen oral therapy   Letrozole daily   04/21/2022 -  Chemotherapy   Herceptin/Perjeta every 21 days initially x 3, will reassess with MRI and then will decide whether to continue or to add chemo.  (Original recommendation was TCHP x 6)      Genetic Testing   [facility] CancerNext-Expanded Panel was Negative. Report date is 05/30/2022.  The CancerNext-Expanded gene panel offered by [facility] and includes sequencing, rearrangement, and RNA analysis for the following 77 genes: AIP, ALK, APC, ATM, AXIN2, BAP1, BARD1, BLM, BMPR1A, BRCA1, BRCA2, BRIP1, CDC73, CDH1, CDK4, CDKN1B, CDKN2A, CHEK2, CTNNA1, DICER1, FANCC, FH, FLCN, GALNT12, KIF1B, LZTR1, MAX, MEN1, MET, MLH1, MSH2, MSH3,  MSH6, MUTYH, NBN, NF1, NF2, NTHL1, PALB2, PHOX2B, PMS2, POT1, PRKAR1A, PTCH1, PTEN, RAD51C, RAD51D, RB1, RECQL, RET, SDHA, SDHAF2, SDHB, SDHC, SDHD, SMAD4, SMARCA4, SMARCB1, SMARCE1, STK11, SUFU, TMEM127, TP53, TSC1, TSC2, VHL and XRCC2 (sequencing and deletion/duplication); EGFR, EGLN1, HOXB13, KIT, MITF, PDGFRA, POLD1, and POLE (sequencing only); EPCAM and GREM1 (deletion/duplication only).    04/21/2022 - 09/14/2022 Chemotherapy   Given neoadjuvantly with Letrozole (declined neoadjuvant chemotherapy with TCHP) Patient is on Treatment Plan : BREAST Trastuzumab  + Pertuzumab q21d x 13 cycles      09/20/2022 Surgery   left lumpectomy and ALND: 0.6 cm IDC 2/14 lymph nodes, margins negative, ER 95%, PR 0%, HER2 positive   10/13/2022 -  Chemotherapy   Patient is on Treatment Plan : BREAST ADO-Trastuzumab Emtansine (Kadcyla) q21d     11/07/2022 - 12/06/2022 Radiation Therapy   Site/dose:   The patient had a prescriptive dose of 50.4 Gy in 28 fractions to the breast and SCLV region using a 4-field approach. This was to be delivered using a 3-D conformal technique. The patient did not receive the planned boost over 10 Gy in 5 fractions.  In total, she completed 36 Gy in 20 of the 33 planned fractions.   02/2023 -  Anti-estrogen oral therapy   Anastrozole daily     CURRENT THERAPY: Anastrozole/Kadcyla  INTERVAL HISTORY: Caitlyn Torres 62 y.o. female returns for follow-up prior to receiving Kadcyla therapy.  Her most recent echocardiogram occurred on Mar 28, 2023 demonstrating a left ventricular ejection fraction of 55%.  There is no significant change from prior  echocardiogram.  He noted a week after she underwent Kadcyla therapy she had intermittent epistaxis.  She was able to get her nose bleeds to stop shortly after they started.  She is also taking anastrozole daily.  She has some vaginal dryness but denies any hot flashes or arthralgias.   Beth also endorses left breast pain.  She is  undergoing physical therapy and will endorse some tightness and discomfort to the area.  She wants to put off repeat imaging until her breast is feeling better.   Patient Active Problem List   Diagnosis Date Noted   Other fatigue 08/28/2022   Acute pain 08/28/2022   Genetic testing 06/01/2022   Family history of breast cancer 05/15/2022   Family history of pancreatic cancer 05/15/2022   Family history of prostate cancer 05/15/2022   Port-A-Cath in place 04/28/2022   Malignant neoplasm of upper-outer quadrant of left breast in female, estrogen receptor positive (HCC) 03/29/2022    is allergic to aleve [naproxen sodium], aspirin, macrobid [nitrofurantoin], motrin [ibuprofen], and tylenol [acetaminophen].  MEDICAL HISTORY: Past Medical History:  Diagnosis Date   Anxiety    Arthritis    Cancer (HCC) 03/2022   L breast   Depression    Family history of breast cancer 05/15/2022   Family history of pancreatic cancer 05/15/2022   Family history of prostate cancer 05/15/2022   GERD (gastroesophageal reflux disease)    History of hiatal hernia    patient believes she may have been told she has this years ago    SURGICAL HISTORY: Past Surgical History:  Procedure Laterality Date   BREAST BIOPSY  09/19/2022   MM LT RADIOACTIVE SEED EA ADD LESION LOC MAMMO GUIDE 09/19/2022 [facility]   BREAST BIOPSY  09/19/2022   MM LT RADIOACTIVE SEED LOC MAMMO GUIDE 09/19/2022 [facility] MAMMOGRAP[facility]   BREAST LUMPECTOMY WITH RADIOACTIVE SEED AND AXILLARY LYMPH NODE DISSECTION Left 09/20/2022   Procedure: LEFT BREAST SEED LUMPECTOMY, LEFT AXILLARY LYMPH NODE DISSECTION;  Surgeon: Harriette Bouillon, MD;  Location: [facility] OR;  Service: General;  Laterality: Left;  GEN & PEC BLOCK   CERVIX SURGERY     Laser removal of pre-cancer cells   LASIK Bilateral    PORTACATH PLACEMENT Right 04/20/2022   Procedure: PORT PLACEMENT;  Surgeon: Harriette Bouillon, MD;  Location: [facility] OR;  Service: General;  Laterality: Right;     SOCIAL HISTORY: Social History   Socioeconomic History   Marital status: Significant Other    Spouse name: [name]   Number of children: 1   Years of education: Not on file   Highest education level: Not on file  Occupational History   Not on file  Tobacco Use   Smoking status: Former    Packs/day: 1.00    Years: 20.00    Additional pack years: 0.00    Total pack years: 20.00    Types: Cigarettes   Smokeless tobacco: Never  Vaping Use   Vaping Use: Never used  Substance and Sexual Activity   Alcohol use: Not Currently   Drug use: Never   Sexual activity: Not on file  Other Topics Concern   Not on file  Social History Narrative   Not on file   Social Determinants of Health   Financial Resource Strain: Low Risk  (04/04/2022)   Overall Financial Resource Strain (CARDIA)    Difficulty of Paying Living Expenses: Not very hard  Food Insecurity: No Food Insecurity (04/04/2022)   Hunger Vital Sign    Worried About Running Out  of Food in the Last Year: Never true    Ran Out of Food in the Last Year: Never true  Transportation Needs: No Transportation Needs (04/04/2022)   PRAPARE - Administrator, Civil Service (Medical): No    Lack of Transportation (Non-Medical): No  Physical Activity: Not on file  Stress: Not on file  Social Connections: Not on file  Intimate Partner Violence: Not At Risk (04/11/2022)   Humiliation, Afraid, Rape, and Kick questionnaire    Fear of Current or Ex-Partner: No    Emotionally Abused: No    Physically Abused: No    Sexually Abused: No    FAMILY HISTORY: Family History  Problem Relation Age of Onset   Prostate cancer Father        dx after 81   Breast cancer Sister 53       neg GT   Prostate cancer Maternal Grandfather        d. late 20s   Pancreatic cancer Paternal Grandmother        d. 8s   Parkinson's disease Paternal Grandmother    Bladder Cancer Paternal Grandfather        dx after 20   Breast cancer Cousin         paternal female cousin; dx 37s    Review of Systems  Constitutional:  Positive for fatigue (Mild). Negative for appetite change, chills, fever and unexpected weight change.  HENT:   Positive for nosebleeds. Negative for hearing loss, lump/mass and trouble swallowing.   Eyes:  Negative for eye problems and icterus.  Respiratory:  Negative for chest tightness, cough and shortness of breath.   Cardiovascular:  Negative for chest pain, leg swelling and palpitations.  Gastrointestinal:  Negative for abdominal distention, abdominal pain, constipation, diarrhea, nausea and vomiting.  Endocrine: Negative for hot flashes.  Genitourinary:  Negative for difficulty urinating.   Musculoskeletal:  Negative for arthralgias.  Skin:  Negative for itching and rash.  Neurological:  Negative for dizziness, extremity weakness, headaches and numbness.  Hematological:  Negative for adenopathy. Does not bruise/bleed easily.  Psychiatric/Behavioral:  Negative for depression. The patient is not nervous/anxious.       PHYSICAL EXAMINATION   Onc Performance Status - 04/24/23 0900       ECOG Perf Status   ECOG Perf Status Fully active, able to carry on all pre-disease performance without restriction      KPS SCALE   KPS % SCORE Able to carry on normal activity, minor s/s of disease             Vitals:   04/24/23 0915  BP: 135/76  Pulse: 64  Resp: 16  Temp: 97.9 F (36.6 C)  SpO2: 99%    Physical Exam Constitutional:      General: She is not in acute distress.    Appearance: Normal appearance. She is not toxic-appearing.  HENT:     Head: Normocephalic and atraumatic.     Mouth/Throat:     Mouth: Mucous membranes are moist.     Pharynx: Oropharynx is clear. No oropharyngeal exudate or posterior oropharyngeal erythema.  Eyes:     General: No scleral icterus. Cardiovascular:     Rate and Rhythm: Normal rate and regular rhythm.     Pulses: Normal pulses.     Heart sounds: Normal heart  sounds.  Pulmonary:     Effort: Pulmonary effort is normal.     Breath sounds: Normal breath sounds.  Abdominal:  General: Abdomen is flat. Bowel sounds are normal. There is no distension.     Palpations: Abdomen is soft.     Tenderness: There is no abdominal tenderness.  Musculoskeletal:        General: No swelling.     Cervical back: Neck supple.  Lymphadenopathy:     Cervical: No cervical adenopathy.  Skin:    General: Skin is warm and dry.     Findings: No rash.  Neurological:     General: No focal deficit present.     Mental Status: She is alert.  Psychiatric:        Mood and Affect: Mood normal.        Behavior: Behavior normal.     LABORATORY DATA:  CBC    Component Value Date/Time   WBC 2.9 (L) 04/24/2023 0849   WBC 3.7 (L) 09/13/2022 0854   RBC 4.84 04/24/2023 0849   HGB 13.2 04/24/2023 0849   HCT 39.8 04/24/2023 0849   PLT 171 04/24/2023 0849   MCV 82.2 04/24/2023 0849   MCH 27.3 04/24/2023 0849   MCHC 33.2 04/24/2023 0849   RDW 15.3 04/24/2023 0849   LYMPHSABS 0.9 04/24/2023 0849   MONOABS 0.4 04/24/2023 0849   EOSABS 0.1 04/24/2023 0849   BASOSABS 0.0 04/24/2023 0849    CMP     Component Value Date/Time   NA 136 04/24/2023 0849   K 4.0 04/24/2023 0849   CL 103 04/24/2023 0849   CO2 28 04/24/2023 0849   GLUCOSE 99 04/24/2023 0849   BUN 12 04/24/2023 0849   CREATININE 0.68 04/24/2023 0849   CALCIUM 10.0 04/24/2023 0849   PROT 7.5 04/24/2023 0849   ALBUMIN 4.5 04/24/2023 0849   AST 37 04/24/2023 0849   ALT 31 04/24/2023 0849   ALKPHOS 61 04/24/2023 0849   BILITOT 0.4 04/24/2023 0849   GFRNONAA >60 04/24/2023 0849   GFRAA  03/07/2011 1741    >60        The eGFR has been calculated using the MDRD equation. This calculation has not been validated in all clinical situations. eGFR's persistently <60 mL/min signify possible Chronic Kidney Disease.         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   Treatment plan based on multidisciplinary tumor board: 1. Neoadjuvant chemotherapy with Herceptin Perjeta and letrozole for 8 cycles.  (Our original recommendation is TCHP x6 cycles but patient is not willing to go through chemo) 2. left lumpectomy and ALND 09/20/2022: 0.6 cm IDC 2/14 lymph nodes, margins negative, ER 95%, PR 0%, HER2 positive 3. Followed by adjuvant radiation therapy 11/07/22-12/06/22  4.  Adjuvant antiestrogen therapy with neratinib ------------------------------------------------------------------------   Kadcyla Toxicities:  Fatigue: managing with energy conservation At risk for heart failure: Her most recent echo was completed 03/2023 EF 55% Leukopenia: mild, monitoring Epistaxis: Has been dose reduced previously.  These resolve with pressure. Platelets are normal today.    Labs reviewed CBC is stable ANC 1.5.     Anastrozole Toxicities:  Fatigue: Conservation Bone Health: Bone density testing shows osteopenia with a T-score of -1.5.  We discussed calcium, vitamin D, weightbearing exercises and recommended repeat  bone density testing in June 2026.  Return in 3 weeks for labs, follow-up with Dr. Pamelia Hoit, and her final Kadcyla.  I will see her back in September 2024 for her survivorship care plan visit.  All questions were answered. The patient knows to call the clinic with any problems, questions or concerns. We can certainly see the patient much sooner if necessary.  Total encounter time:30 minutes*in face-to-face visit time, chart review, lab review, care coordination, order entry, and  documentation of the encounter time.    Lillard Anes, NP 04/24/23 9:54 AM Medical Oncology and Hematology [facility] [address] [place], [place] [address] Tel. [phone]    Fax. [phone]  *Total Encounter Time as defined by the Centers for Medicare and Medicaid Services includes, in addition to the face-to-face time of a patient visit (documented in the note above) non-face-to-face time: obtaining and reviewing outside history, ordering and reviewing medications, tests or procedures, care coordination (communications with other health care professionals or caregivers) and documentation in the medical record.

## 2023-04-24 NOTE — Assessment & Plan Note (Signed)
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   Treatment plan based on multidisciplinary tumor board: 1. Neoadjuvant chemotherapy with Herceptin Perjeta and letrozole for 8 cycles.  (Our original recommendation is TCHP x6 cycles but patient is not willing to go through chemo) 2. left lumpectomy and ALND 09/20/2022: 0.6 cm IDC 2/14 lymph nodes, margins negative, ER 95%, PR 0%, HER2 positive 3. Followed by adjuvant radiation therapy 11/07/22-12/06/22  4.  Adjuvant antiestrogen therapy with neratinib ------------------------------------------------------------------------   Kadcyla Toxicities:  Fatigue: managing with energy conservation At risk for heart failure: Her most recent echo was completed 03/2023 EF 55% Leukopenia: mild, monitoring Epistaxis: Has been dose reduced previously.  These resolve with pressure. Platelets are normal today.    Labs reviewed CBC is stable ANC 1.5.     Anastrozole Toxicities:  Fatigue: Conservation Bone Health: Bone density testing shows osteopenia with a T-score of -1.5.  We discussed calcium, vitamin D, weightbearing exercises and recommended repeat bone density testing in June 2026.  Return in 3 weeks for labs, follow-up with Dr. Pamelia Hoit, and her final Kadcyla.  I will see her back in September 2024 for her survivorship care plan visit.

## 2023-04-25 ENCOUNTER — Ambulatory Visit: Payer: BC Managed Care – PPO

## 2023-04-25 DIAGNOSIS — I890 Lymphedema, not elsewhere classified: Secondary | ICD-10-CM

## 2023-04-25 DIAGNOSIS — R293 Abnormal posture: Secondary | ICD-10-CM

## 2023-04-25 DIAGNOSIS — M25612 Stiffness of left shoulder, not elsewhere classified: Secondary | ICD-10-CM

## 2023-04-25 DIAGNOSIS — C50412 Malignant neoplasm of upper-outer quadrant of left female breast: Secondary | ICD-10-CM

## 2023-04-25 DIAGNOSIS — Z483 Aftercare following surgery for neoplasm: Secondary | ICD-10-CM

## 2023-04-25 NOTE — Therapy (Signed)
OUTPATIENT PHYSICAL THERAPY  UPPER EXTREMITY ONCOLOGY TREATMENT  Patient Name: Caitlyn Torres MRN: [id number] DOB:06-12-1961, 62 y.o., female Today's Date: 04/25/2023  END OF SESSION:  PT End of Session - 04/25/23 1101     Visit Number 14    Number of Visits 20    Date for PT Re-Evaluation 05/03/23    PT Start Time 1103    PT Stop Time 1148    PT Time Calculation (min) 45 min    Activity Tolerance Patient tolerated treatment well    Behavior During Therapy [facility] for tasks assessed/performed             Past Medical History:  Diagnosis Date   Anxiety    Arthritis    Cancer (HCC) 03/2022   L breast   Depression    Family history of breast cancer 05/15/2022   Family history of pancreatic cancer 05/15/2022   Family history of prostate cancer 05/15/2022   GERD (gastroesophageal reflux disease)    History of hiatal hernia    patient believes she may have been told she has this years ago   Past Surgical History:  Procedure Laterality Date   BREAST BIOPSY  09/19/2022   MM LT RADIOACTIVE SEED EA ADD LESION LOC MAMMO GUIDE 09/19/2022 [facility]AP[facility]   BREAST BIOPSY  09/19/2022   MM LT RADIOACTIVE SEED LOC MAMMO GUIDE 09/19/2022 [facility]   BREAST LUMPECTOMY WITH RADIOACTIVE SEED AND AXILLARY LYMPH NODE DISSECTION Left 09/20/2022   Procedure: LEFT BREAST SEED LUMPECTOMY, LEFT AXILLARY LYMPH NODE DISSECTION;  Surgeon: Harriette Bouillon, MD;  Location: [facility] OR;  Service: General;  Laterality: Left;  GEN & PEC BLOCK   CERVIX SURGERY     Laser removal of pre-cancer cells   LASIK Bilateral    PORTACATH PLACEMENT Right 04/20/2022   Procedure: PORT PLACEMENT;  Surgeon: Harriette Bouillon, MD;  Location: [facility] OR;  Service: General;  Laterality: Right;   Patient Active Problem List   Diagnosis Date Noted   Other fatigue 08/28/2022   Acute pain 08/28/2022   Genetic testing 06/01/2022   Family history of breast cancer 05/15/2022   Family history of pancreatic cancer 05/15/2022    Family history of prostate cancer 05/15/2022   Port-A-Cath in place 04/28/2022   Malignant neoplasm of upper-outer quadrant of left breast in female, estrogen receptor positive (HCC) 03/29/2022      REFERRING PROVIDER: [name], MD  REFERRING DIAG: Left breast swelling/tenderness  THERAPY DIAG:  Malignant neoplasm of upper-outer quadrant of left female breast, unspecified estrogen receptor status (HCC)  Stiffness of left shoulder, not elsewhere classified  Lymphedema of breast  Aftercare following surgery for neoplasm  Abnormal posture  ONSET DATE: Early March  Rationale for Evaluation and Treatment: Rehabilitation  SUBJECTIVE:  SUBJECTIVE STATEMENT:  Feel good today. No bloody nose yet from infusion yesterday. Feeling better than I was last week and I have been wearing the sleeve more. I am not as sore as last week. I have been trying not to sleep on that side. I still get some impingement symptoms at my lateral arm. I think my breast looks pretty good. I do my MLD every day.   PERTINENT HISTORY:  03/14/2022:Screening detected left breast masses 1.5 cm and 1.3 cm with enlarged left axillary lymph node, biopsy revealed grade 3 IDC with lymph node being positive, ER 95%, PR 0%, HER2 2+ by IHC and FISH positive with a ratio 2.45 and a copy #4.9, the second biopsy was HER2 negative with a ratio  1. Neoadjuvant chemotherapy with Herceptin Perjeta and letrozole for 8 cycles.  (Our original recommendation is TCHP x6 cycles but patient is not willing to go through chemo) 2. left lumpectomy and ALND 09/20/2022: 0.6 cm IDC 2/14 lymph nodes, margins negative, ER 95%, PR 0%, HER2 positive 3. Followed by adjuvant radiation therapy  4.  Adjuvant antiestrogen therapy with neratinib She had left lumpectomy on  09/20/2022 with 2/14 LN's    PAIN:  Are you having pain? Yes, my neck, back,  NPRS scale:2-3/10 Pain location: LEFT chest and axilla Pain orientation: Left  PAIN TYPE: tight and sore Pain description: intermittent Aggravating factors: being hugged, reaching, lying on left side Relieving factors: Compression for cording  PRECAUTIONS: Left UE lymphedema precautions  WEIGHT BEARING RESTRICTIONS: No  FALLS:  Has patient fallen in last 6 months? 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  SENSATION: Light touch: Deficits     POSTURE: forward head, rounded shoulders  UPPER EXTREMITY AROM/PROM:  A/PROM RIGHT   eval   Shoulder extension 42  Shoulder flexion 163  Shoulder abduction 179  Shoulder internal rotation 58  Shoulder external rotation 88    (Blank rows = not tested)  A/PROM LEFT   eval LEFT 03/13/2023 LEFT 04/18/23  Shoulder extension 37  50  Shoulder flexion 144 159 160  Shoulder abduction 140 168 175  Shoulder internal rotation 56    Shoulder external rotation 84      (Blank rows = not tested)  CERVICAL AROM: All within normal limits:     UPPER EXTREMITY STRENGTH:   LYMPHEDEMA ASSESSMENTS:   SURGERY TYPE/DATE: Left lumpectomy,09/20/2022  NUMBER OF LYMPH  NODES REMOVED: 2+/14  CHEMOTHERAPY: No at her request, Did Herceptin, Perjeta and letrozole  RADIATION:yes but stopped early at her request  HORMONE TREATMENT: YES  INFECTIONS: NO   LYMPHEDEMA ASSESSMENTS:   LANDMARK RIGHT  eval  At axilla    15 cm proximal to olecranon process   10 cm proximal to olecranon process   Olecranon process   15 cm proximal to ulnar styloid process   10 cm proximal to ulnar styloid process  Just proximal to ulnar styloid process   Across hand at thumb web space   At base of 2nd digit   (Blank rows = not tested)  LANDMARK LEFT  eval  At axilla    15 cm proximal to olecranon process   10 cm proximal to olecranon process   Olecranon process   15 cm proximal to ulnar styloid process   10 cm proximal to ulnar styloid process   Just proximal to ulnar styloid process   Across hand at thumb web space   At base of 2nd digit   (Blank rows = not tested)    L-DEX LYMPHEDEMA SCREENING: The patient was assessed using the L-Dex machine today to produce a lymphedema index baseline score. The patient will be reassessed on a regular basis (typically every 3 months) to obtain new L-Dex scores. If the score is > 6.5 points away from his/her baseline score indicating onset of subclinical lymphedema, it will be recommended to wear a compression garment for 4 weeks, 12 hours per day and then be reassessed. If the score continues to be > 6.5 points from baseline at reassessment, we will initiate lymphedema treatment. Assessing in this manner has a 95% rate of preventing clinically significant lymphedema.   BREAST COMPLAINTS QUESTIONNAIRE(EVAL) Pain:8 Heaviness:8 Swollen feeling:9 Tense Skin:8 Redness:6 Bra Print:9 Size of Pores:5 Hard feeling: 6 Total:   59  /80 A Score over 9 indicates lymphedema issues in the breast  BREAST COMPLAINTS QUESTIONNAIRE(04/05/2023) Pain:5 Heaviness:4 Swollen feeling:8 Tense Skin:5 Redness:2 Bra Print:9 Size of  Po9res: Hard feeling7:  Total:   49  /80 A Score over 9 indicates lymphedema issues in the breast    TODAY'S TREATMENT:                                                                                                                                          DATE:  Pt permission and consent throughout each step of examination and treatment with modification and draping if requested when working on sensitive areas  04/25/23 STM left UT,pectorals/lateral trunk and in SL to left scapular with cupping to left pectorals and scapular region MFR to left axilla and forearm area of cording PROM left shoulder flexion, scaption, abduction, ER Half foam roll: Bilateral flexion, scaption horizontal abd x 5 LTR x 4 B  04/18/2023 MFR to left UE cording at axilla, upper arm and forearm STM left UT,pectorals/lateral trunk and in SL to left scapular with cupping to left pectorals and scapular region PROM left shoulder flexion, scaption, abduction, ER Half foam roll: Bilateral flexion, scaption horizontal abd x 5 Open book x 5, LTR x 5 with arms outstretched Measured AROM  04/05/2023 MFR to left UE cording at axilla, upper arm and forearm STM left UT,pectorals/lateral trunk PROM left shoulder flexion, scaption, abduction, ER MLD to left breast;Short neck, 5 diaphragmatic breaths, R axillary nodes and establishment of interaxillary  pathway, L inguinal nodes and establishment of axilloinguinal pathway, then L breast moving fluid towards pathways spending extra time in any areas of fibrosis then retracing all steps Checked goals for PN  03/28/2023 SOZO screen done and  is well within normal limits; next 3 month screen set up MFR to left UE cording at axilla, upper arm and forearm PROM left shoulder flexion, scaption, abduction, ER MFR bilateral pectoral regions MLD to left breast;Short neck, 5 diaphragmatic breaths, R axillary nodes and establishment of interaxillary pathway, L inguinal nodes and  establishment of axilloinguinal pathway, then L breast moving fluid towards pathways spending extra time in any areas of fibrosis then retracing all steps   PATIENT EDUCATION: 02/20/2022 Education details: Discussed POC, Left UQ tightness and limitations in ROM, breast swelling, compression bra, MLD Person educated: Patient Education method: Explanation Education comprehension: verbalized understanding  HOME EXERCISE PROGRAM: None given; will resume 4 post of exercises and hold on band exercises right now.  ASSESSMENT:  CLINICAL IMPRESSION: No pain in left posterior shoulder today with ROM activities. Very tight in axillary and clavicular border of pecs and lateral trunk today. Cording less noticeable  today. Felt good after treatment. OBJECTIVE IMPAIRMENTS: decreased activity tolerance, decreased knowledge of condition, decreased ROM, increased edema, increased fascial restrictions, impaired UE functional use, postural dysfunction, and pain.   ACTIVITY LIMITATIONS: sleeping and reach over head  PARTICIPATION LIMITATIONS: interpersonal relationship  PERSONAL FACTORS: 3+ comorbidities: Left breast cancer s/p infusions and radiation  are also affecting patient's functional outcome.   REHAB POTENTIAL: Excellent  CLINICAL DECISION MAKING: Stable/uncomplicated  EVALUATION COMPLEXITY: Low  GOALS: Goals reviewed with patient? Yes  SHORT TERM GOALS= LONG TERM GOALS: Target date: 04/04/2023  Pt will be independent in MLD for reducing left breast edema Baseline: Goal status: MET 04/05/2023  2.  Pt will note decreased breast swelling/tenderness by atleast 50% Baseline:  Goal status:ONGOING 3.  Pt will be able to lie on her left side to sleep for short periods of time Baseline:  Goal status:MET 04/05/2023 4.  Pt will have left shoulder ROM WNL without increased pain with reaching Baseline:  Goal status:ONGOING  5.  Breast complaints survey will be reduced to no greater than  9 Baseline:  Goal status: Ongoing PLAN:  PT FREQUENCY: 2x/week  PT DURATION: 4 weeks  PLANNED INTERVENTIONS: Therapeutic exercises, Therapeutic activity, Patient/Family education, Self Care, Joint mobilization, Orthotic/Fit training, Manual lymph drainage, scar mobilization, Vasopneumatic device, Manual therapy, and Re-evaluation  PLAN FOR NEXT SESSION:Half foam roll, Flexi? how was peach foam for bra?, review L. breast MLD,  MFR to cording,STM to left UQ, PROM left shoulder, LTR with arms outstretched,  MLD to left breast and review with pt., update HEP prn.  Waynette Buttery, PT 04/25/2023, 11:49 AM

## 2023-05-01 ENCOUNTER — Ambulatory Visit: Payer: BC Managed Care – PPO

## 2023-05-01 DIAGNOSIS — C50412 Malignant neoplasm of upper-outer quadrant of left female breast: Secondary | ICD-10-CM

## 2023-05-01 DIAGNOSIS — M25612 Stiffness of left shoulder, not elsewhere classified: Secondary | ICD-10-CM

## 2023-05-01 DIAGNOSIS — Z483 Aftercare following surgery for neoplasm: Secondary | ICD-10-CM

## 2023-05-01 DIAGNOSIS — I890 Lymphedema, not elsewhere classified: Secondary | ICD-10-CM

## 2023-05-01 DIAGNOSIS — R293 Abnormal posture: Secondary | ICD-10-CM

## 2023-05-01 NOTE — Therapy (Signed)
OUTPATIENT PHYSICAL THERAPY  UPPER EXTREMITY ONCOLOGY TREATMENT  Patient Name: Caitlyn Torres MRN: [id number] DOB:08-05-1961, 62 y.o., female Today's Date: 05/01/2023  END OF SESSION:  PT End of Session - 05/01/23 0905     Visit Number 15    Number of Visits 20    Date for PT Re-Evaluation 05/03/23    PT Start Time 0906    PT Stop Time 0954    PT Time Calculation (min) 48 min    Activity Tolerance Patient tolerated treatment well    Behavior During Therapy [facility] for tasks assessed/performed             Past Medical History:  Diagnosis Date   Anxiety    Arthritis    Cancer (HCC) 03/2022   L breast   Depression    Family history of breast cancer 05/15/2022   Family history of pancreatic cancer 05/15/2022   Family history of prostate cancer 05/15/2022   GERD (gastroesophageal reflux disease)    History of hiatal hernia    patient believes she may have been told she has this years ago   Past Surgical History:  Procedure Laterality Date   BREAST BIOPSY  09/19/2022   MM LT RADIOACTIVE SEED EA ADD LESION LOC MAMMO GUIDE 09/19/2022 [facility]   BREAST BIOPSY  09/19/2022   MM LT RADIOACTIVE SEED LOC MAMMO GUIDE 09/19/2022 [facility]   BREAST LUMPECTOMY WITH RADIOACTIVE SEED AND AXILLARY LYMPH NODE DISSECTION Left 09/20/2022   Procedure: LEFT BREAST SEED LUMPECTOMY, LEFT AXILLARY LYMPH NODE DISSECTION;  Surgeon: Harriette Bouillon, MD;  Location: [facility] OR;  Service: General;  Laterality: Left;  GEN & PEC BLOCK   CERVIX SURGERY     Laser removal of pre-cancer cells   LASIK Bilateral    PORTACATH PLACEMENT Right 04/20/2022   Procedure: PORT PLACEMENT;  Surgeon: Harriette Bouillon, MD;  Location: [facility] OR;  Service: General;  Laterality: Right;   Patient Active Problem List   Diagnosis Date Noted   Other fatigue 08/28/2022   Acute pain 08/28/2022   Genetic testing 06/01/2022   Family history of breast cancer 05/15/2022   Family history of pancreatic cancer 05/15/2022    Family history of prostate cancer 05/15/2022   Port-A-Cath in place 04/28/2022   Malignant neoplasm of upper-outer quadrant of left breast in female, estrogen receptor positive (HCC) 03/29/2022      REFERRING PROVIDER: [name], MD  REFERRING DIAG: Left breast swelling/tenderness  THERAPY DIAG:  Malignant neoplasm of upper-outer quadrant of left female breast, unspecified estrogen receptor status (HCC)  Stiffness of left shoulder, not elsewhere classified  Lymphedema of breast  Aftercare following surgery for neoplasm  Abnormal posture  ONSET DATE: Early March  Rationale for Evaluation and Treatment: Rehabilitation  SUBJECTIVE:  SUBJECTIVE STATEMENT:  Last week was pretty good after PT.  I have been wearing my sleeve more and that seems to help. My breast was hurting some and more swollen after doing a lot of housecleaning so we can move in. The back of my shoulder has continued to hurt and feels strained.  PERTINENT HISTORY:  03/14/2022:Screening detected left breast masses 1.5 cm and 1.3 cm with enlarged left axillary lymph node, biopsy revealed grade 3 IDC with lymph node being positive, ER 95%, PR 0%, HER2 2+ by IHC and FISH positive with a ratio 2.45 and a copy #4.9, the second biopsy was HER2 negative with a ratio  1. Neoadjuvant chemotherapy with Herceptin Perjeta and letrozole for 8 cycles.  (Our original recommendation is TCHP x6 cycles but patient is not willing to go through chemo) 2. left lumpectomy and ALND 09/20/2022: 0.6 cm IDC 2/14 lymph nodes, margins negative, ER 95%, PR 0%, HER2 positive 3. Followed by adjuvant radiation therapy  4.  Adjuvant antiestrogen therapy with neratinib She had left lumpectomy on 09/20/2022 with 2/14 LN's    PAIN:  Are you having pain? Yes, my neck,  back,  NPRS scale:3/10 Pain location: LEFT chest and axilla Pain orientation: Left  PAIN TYPE: tight and sore Pain description: intermittent Aggravating factors: being hugged, reaching, lying on left side Relieving factors: Compression for cording  PRECAUTIONS: Left UE lymphedema precautions  WEIGHT BEARING RESTRICTIONS: No  FALLS:  Has patient fallen in last 6 months? 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  SENSATION: Light touch: Deficits     POSTURE: forward head, rounded shoulders  UPPER EXTREMITY AROM/PROM:  A/PROM RIGHT   eval   Shoulder extension 42  Shoulder flexion 163  Shoulder abduction 179  Shoulder internal rotation 58  Shoulder external rotation 88    (Blank rows = not tested)  A/PROM LEFT   eval LEFT 03/13/2023 LEFT 04/18/23  Shoulder extension 37  50  Shoulder flexion 144 159 160  Shoulder abduction 140 168 175  Shoulder internal rotation 56    Shoulder external rotation 84      (Blank rows = not tested)  CERVICAL AROM: All within normal limits:     UPPER EXTREMITY STRENGTH:   LYMPHEDEMA ASSESSMENTS:   SURGERY TYPE/DATE: Left lumpectomy,09/20/2022  NUMBER OF LYMPH NODES REMOVED: 2+/14  CHEMOTHERAPY: No at her request, Did Herceptin,  Perjeta and letrozole  RADIATION:yes but stopped early at her request  HORMONE TREATMENT: YES  INFECTIONS: NO   LYMPHEDEMA ASSESSMENTS:   LANDMARK RIGHT  eval  At axilla    15 cm proximal to olecranon process   10 cm proximal to olecranon process   Olecranon process   15 cm proximal to ulnar styloid process   10 cm proximal to ulnar styloid process   Just proximal to ulnar styloid process   Across hand at thumb web space  At base of 2nd digit   (Blank rows = not tested)  LANDMARK LEFT  eval  At axilla    15 cm proximal to olecranon process   10 cm proximal to olecranon process   Olecranon process   15 cm proximal to ulnar styloid process   10 cm proximal to ulnar styloid process   Just proximal to ulnar styloid process   Across hand at thumb web space   At base of 2nd digit   (Blank rows = not tested)    L-DEX LYMPHEDEMA SCREENING: The patient was assessed using the L-Dex machine today to produce a lymphedema index baseline score. The patient will be reassessed on a regular basis (typically every 3 months) to obtain new L-Dex scores. If the score is > 6.5 points away from his/her baseline score indicating onset of subclinical lymphedema, it will be recommended to wear a compression garment for 4 weeks, 12 hours per day and then be reassessed. If the score continues to be > 6.5 points from baseline at reassessment, we will initiate lymphedema treatment. Assessing in this manner has a 95% rate of preventing clinically significant lymphedema.   BREAST COMPLAINTS QUESTIONNAIRE(EVAL) Pain:8 Heaviness:8 Swollen feeling:9 Tense Skin:8 Redness:6 Bra Print:9 Size of Pores:5 Hard feeling: 6 Total:   59  /80 A Score over 9 indicates lymphedema issues in the breast  BREAST COMPLAINTS QUESTIONNAIRE(04/05/2023) Pain:5 Heaviness:4 Swollen feeling:8 Tense Skin:5 Redness:2 Bra Print:9 Size of Po9res: Hard feeling7:  Total:   49  /80 A Score over 9 indicates lymphedema  issues in the breast    TODAY'S TREATMENT:                                                                                                                                          DATE:  Pt permission and consent throughout each step of examination and treatment with modification and draping if requested when working on sensitive areas  05/01/2023 STM bilateral UT, left pectorals/lateral trunk and in SL to left scapular with cupping to left pectorals and scapular region Manual traction, suboccipital release MFR to left axilla and forearm area of cording Supine horizontal abd,, fl yellow band x 10 flexion and ER with   04/25/23 STM left UT,pectorals/lateral trunk and in SL to left scapular with cupping to left pectorals and scapular region MFR to left axilla and forearm area of cording PROM left shoulder flexion, scaption, abduction, ER Half foam roll: Bilateral flexion, scaption horizontal abd x 5 LTR x 4 B  04/18/2023 MFR to left UE cording at axilla, upper arm and forearm STM left UT,pectorals/lateral trunk and in SL to left scapular with cupping to left pectorals and scapular region PROM left shoulder flexion, scaption, abduction, ER Half foam roll: Bilateral flexion, scaption horizontal abd x 5 Open book x 5, LTR x 5 with arms outstretched Measured AROM  04/05/2023 MFR to left UE cording  at axilla, upper arm and forearm STM left UT,pectorals/lateral trunk PROM left shoulder flexion, scaption, abduction, ER MLD to left breast;Short neck, 5 diaphragmatic breaths, R axillary nodes and establishment of interaxillary pathway, L inguinal nodes and establishment of axilloinguinal pathway, then L breast moving fluid towards pathways spending extra time in any areas of fibrosis then retracing all steps Checked goals for PN  03/28/2023 SOZO screen done and  is well within normal limits; next 3 month screen set up MFR to left UE cording at axilla, upper arm and forearm PROM left shoulder  flexion, scaption, abduction, ER MFR bilateral pectoral regions MLD to left breast;Short neck, 5 diaphragmatic breaths, R axillary nodes and establishment of interaxillary pathway, L inguinal nodes and establishment of axilloinguinal pathway, then L breast moving fluid towards pathways spending extra time in any areas of fibrosis then retracing all steps   PATIENT EDUCATION: 02/20/2022 Education details: Discussed POC, Left UQ tightness and limitations in ROM, breast swelling, compression bra, MLD Person educated: Patient Education method: Explanation Education comprehension: verbalized understanding  HOME EXERCISE PROGRAM: None given; will resume 4 post of exercises and hold on band exercises right now.  ASSESSMENT:  CLINICAL IMPRESSION: No pain in left posterior shoulder today with activities in clinic. Continued  tightness  in axillary and clavicular border of pecs and lateral trunk today. Cording much improved overall. OBJECTIVE IMPAIRMENTS: decreased activity tolerance, decreased knowledge of condition, decreased ROM, increased edema, increased fascial restrictions, impaired UE functional use, postural dysfunction, and pain.   ACTIVITY LIMITATIONS: sleeping and reach over head  PARTICIPATION LIMITATIONS: interpersonal relationship  PERSONAL FACTORS: 3+ comorbidities: Left breast cancer s/p infusions and radiation  are also affecting patient's functional outcome.   REHAB POTENTIAL: Excellent  CLINICAL DECISION MAKING: Stable/uncomplicated  EVALUATION COMPLEXITY: Low  GOALS: Goals reviewed with patient? Yes  SHORT TERM GOALS= LONG TERM GOALS: Target date: 04/04/2023  Pt will be independent in MLD for reducing left breast edema Baseline: Goal status: MET 04/05/2023  2.  Pt will note decreased breast swelling/tenderness by atleast 50% Baseline:  Goal status:MET 05/01/2023 3.  Pt will be able to lie on her left side to sleep for short periods of time Baseline:  Goal  status:MET 04/05/2023 4.  Pt will have left shoulder ROM WNL without increased pain with reaching Baseline:  Goal status:ONGOING  5.  Breast complaints survey will be reduced to no greater than 9 Baseline:  Goal status: Ongoing PLAN:  PT FREQUENCY: 2x/week  PT DURATION: 4 weeks  PLANNED INTERVENTIONS: Therapeutic exercises, Therapeutic activity, Patient/Family education, Self Care, Joint mobilization, Orthotic/Fit training, Manual lymph drainage, scar mobilization, Vasopneumatic device, Manual therapy, and Re-evaluation  PLAN FOR NEXT SESSION: RECERT, DN?Half foam roll, Flexi? how was peach foam for bra?, review L. breast MLD,  MFR to cording,STM to left UQ, PROM left shoulder, LTR with arms outstretched,  MLD to left breast and review with pt., update HEP prn.  Waynette Buttery, PT 05/01/2023, 10:01 AM

## 2023-05-03 ENCOUNTER — Ambulatory Visit: Payer: BC Managed Care – PPO

## 2023-05-03 DIAGNOSIS — C50412 Malignant neoplasm of upper-outer quadrant of left female breast: Secondary | ICD-10-CM | POA: Diagnosis not present

## 2023-05-03 DIAGNOSIS — R293 Abnormal posture: Secondary | ICD-10-CM

## 2023-05-03 DIAGNOSIS — Z483 Aftercare following surgery for neoplasm: Secondary | ICD-10-CM

## 2023-05-03 DIAGNOSIS — M25612 Stiffness of left shoulder, not elsewhere classified: Secondary | ICD-10-CM

## 2023-05-03 DIAGNOSIS — I890 Lymphedema, not elsewhere classified: Secondary | ICD-10-CM

## 2023-05-03 NOTE — Therapy (Signed)
OUTPATIENT PHYSICAL THERAPY  UPPER EXTREMITY ONCOLOGY TREATMENT  Patient Name: Caitlyn Torres MRN: [id number] DOB:[birth year], 62 y.o., female Today's Date: 05/03/2023  END OF SESSION:  PT End of Session - 05/03/23 1008     Visit Number 16    Number of Visits 26    Date for PT Re-Evaluation 06/14/23    PT Start Time 1009    PT Stop Time 1100    PT Time Calculation (min) 51 min    Activity Tolerance Patient tolerated treatment well    Behavior During Therapy [facility] for tasks assessed/performed             Past Medical History:  Diagnosis Date   Anxiety    Arthritis    Cancer (HCC) 03/2022   L breast   Depression    Family history of breast cancer 05/15/2022   Family history of pancreatic cancer 05/15/2022   Family history of prostate cancer 05/15/2022   GERD (gastroesophageal reflux disease)    History of hiatal hernia    patient believes she may have been told she has this years ago   Past Surgical History:  Procedure Laterality Date   BREAST BIOPSY  09/19/2022   MM LT RADIOACTIVE SEED EA ADD LESION LOC MAMMO GUIDE 09/19/2022 [facility]   BREAST BIOPSY  09/19/2022   MM LT RADIOACTIVE SEED LOC MAMMO GUIDE 09/19/2022 [facility]   BREAST LUMPECTOMY WITH RADIOACTIVE SEED AND AXILLARY LYMPH NODE DISSECTION Left 09/20/2022   Procedure: LEFT BREAST SEED LUMPECTOMY, LEFT AXILLARY LYMPH NODE DISSECTION;  Surgeon: Harriette Bouillon, MD;  Location: [facility] OR;  Service: General;  Laterality: Left;  GEN & PEC BLOCK   CERVIX SURGERY     Laser removal of pre-cancer cells   LASIK Bilateral    PORTACATH PLACEMENT Right 04/20/2022   Procedure: PORT PLACEMENT;  Surgeon: Harriette Bouillon, MD;  Location: [facility] OR;  Service: General;  Laterality: Right;   Patient Active Problem List   Diagnosis Date Noted   Other fatigue 08/28/2022   Acute pain 08/28/2022   Genetic testing 06/01/2022   Family history of breast cancer 05/15/2022   Family history of pancreatic cancer 05/15/2022    Family history of prostate cancer 05/15/2022   Port-A-Cath in place 04/28/2022   Malignant neoplasm of upper-outer quadrant of left breast in female, estrogen receptor positive (HCC) 03/29/2022      REFERRING PROVIDER: [name], MD  REFERRING DIAG: Left breast swelling/tenderness  THERAPY DIAG:  Malignant neoplasm of upper-outer quadrant of left female breast, unspecified estrogen receptor status (HCC)  Stiffness of left shoulder, not elsewhere classified  Lymphedema of breast  Aftercare following surgery for neoplasm  Abnormal posture  ONSET DATE: Early March  Rationale for Evaluation and Treatment: Rehabilitation  SUBJECTIVE:  SUBJECTIVE STATEMENT:  I am in the process of moving and the  tenseness/pain is overall better, but is still there. I am concerned that with moving it will all get worse. I do think cording is better. I would like to try the DN for the tightness in my neck and chest area.  My breast is improved alot but is still swollen. I May like to consider the Flexi touch. My left shoulder feels like it is getting hung  up in the back sometimes.  PERTINENT HISTORY:  03/14/2022:Screening detected left breast masses 1.5 cm and 1.3 cm with enlarged left axillary lymph node, biopsy revealed grade 3 IDC with lymph node being positive, ER 95%, PR 0%, HER2 2+ by IHC and FISH positive with a ratio 2.45 and a copy #4.9, the second biopsy was HER2 negative with a ratio  1. Neoadjuvant chemotherapy with Herceptin Perjeta and letrozole for 8 cycles.  (Our original recommendation is TCHP x6 cycles but patient is not willing to go through chemo) 2. left lumpectomy and ALND 09/20/2022: 0.6 cm IDC 2/14 lymph nodes, margins negative, ER 95%, PR 0%, HER2 positive 3. Followed by adjuvant radiation  therapy  4.  Adjuvant antiestrogen therapy with neratinib She had left lumpectomy on 09/20/2022 with 2/14 LN's    PAIN:  Are you having pain? Yes, my neck, back,  NPRS scale:3/10 Pain location: LEFT chest and axilla Pain orientation: Left  PAIN TYPE: tight and sore Pain description: intermittent Aggravating factors: being hugged, reaching, lying on left side Relieving factors: Compression for cording  PRECAUTIONS: Left UE lymphedema precautions  WEIGHT BEARING RESTRICTIONS: No  FALLS:  Has patient fallen in last 6 months? 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  SENSATION: Light touch: Deficits     POSTURE: forward head, rounded shoulders  UPPER EXTREMITY AROM/PROM:  A/PROM RIGHT   eval   Shoulder extension 42  Shoulder flexion 163  Shoulder abduction 179  Shoulder internal rotation 58  Shoulder external rotation 88    (Blank rows = not tested)  A/PROM LEFT   eval LEFT 03/13/2023 LEFT 04/18/23 LEFT 05/03/23  Shoulder extension 37  50 45  Shoulder flexion 144 159 160 160  Shoulder abduction 140 168 175 167  Shoulder internal rotation 56     Shoulder external rotation 84       (Blank rows = not tested)  CERVICAL AROM: All within normal limits:     UPPER  EXTREMITY STRENGTH:   LYMPHEDEMA ASSESSMENTS:   SURGERY TYPE/DATE: Left lumpectomy,09/20/2022  NUMBER OF LYMPH NODES REMOVED: 2+/14  CHEMOTHERAPY: No at her request, Did Herceptin, Perjeta and letrozole  RADIATION:yes but stopped early at her request  HORMONE TREATMENT: YES  INFECTIONS: NO   LYMPHEDEMA ASSESSMENTS:   LANDMARK RIGHT  eval  At axilla  24.8  15 cm proximal to olecranon process   10 cm proximal to olecranon  process 22.8  Olecranon process 21.8  15 cm proximal to ulnar styloid process   10 cm proximal to ulnar styloid process 20.6  Just proximal to ulnar styloid process 14.5  Across hand at thumb web space 19.5  At base of 2nd digit 5.7  (Blank rows = not tested)  LANDMARK LEFT  eval  At axilla  25.5  15 cm proximal to olecranon process   10 cm proximal to olecranon process 22.7  Olecranon process 21.5  15 cm proximal to ulnar styloid process   10 cm proximal to ulnar styloid process 19.3  Just proximal to ulnar styloid process 14.45  Across hand at thumb web space 18.3  At base of 2nd digit 5.7  (Blank rows = not tested)  Chest 85 cm  L-DEX LYMPHEDEMA SCREENING: The patient was assessed using the L-Dex machine today to produce a lymphedema index baseline score. The patient will be reassessed on a regular basis (typically every 3 months) to obtain new L-Dex scores. If the score is > 6.5 points away from his/her baseline score indicating onset of subclinical lymphedema, it will be recommended to wear a compression garment for 4 weeks, 12 hours per day and then be reassessed. If the score continues to be > 6.5 points from baseline at reassessment, we will initiate lymphedema treatment. Assessing in this manner has a 95% rate of preventing clinically significant lymphedema.   BREAST COMPLAINTS QUESTIONNAIRE(EVAL) Pain:8 Heaviness:8 Swollen feeling:9 Tense Skin:8 Redness:6 Bra Print:9 Size of Pores:5 Hard feeling: 6 Total:   59  /80 A Score over 9  indicates lymphedema issues in the breast  BREAST COMPLAINTS QUESTIONNAIRE(04/05/2023) Pain:5 Heaviness:4 Swollen feeling:8 Tense Skin:5 Redness:2 Bra Print:9 Size of Po9res: Hard feeling7:  Total:   49  /80 A Score over 9 indicates lymphedema issues in the breast   BREAST COMPLAINTS QUESTIONNAIRE (05/03/23) Pain:3 Heaviness:3 Swollen feeling:3 Tense Skin:2 Redness:0 Bra Print:3 Size of Pores:3 Hard feeling: 4 Total:   21  /80 A Score over 9 indicates lymphedema issues in the breast   05/03/23  21  TODAY'S TREATMENT:                                                                                                                                          DATE:  Pt permission and consent throughout each step of examination and treatment with modification and draping if requested when working on sensitive areas  05/03/23 Supine wand flex and scaption x 5 PROM left shoulder flex, scaption, abduction,ER Measured Circumferences Bilaterally and left shoulder AROM STM bilateral UT, left pectorals/lateral trunk and in SL to left scapular region   05/01/2023 STM bilateral UT, left pectorals/lateral trunk and in SL to left scapular with cupping to left pectorals and scapular region Manual traction, suboccipital release MFR to left axilla and forearm area of cording Supine horizontal abd,, fl yellow band x 10 flexion and ER  with   04/25/23 STM left UT,pectorals/lateral trunk and in SL to left scapular with cupping to left pectorals and scapular region MFR to left axilla and forearm area of cording PROM left shoulder flexion, scaption, abduction, ER Half foam roll: Bilateral flexion, scaption horizontal abd x 5 LTR x 4 B  04/18/2023 MFR to left UE cording at axilla, upper arm and forearm STM left UT,pectorals/lateral trunk and in SL to left scapular with cupping to left pectorals and scapular region PROM left shoulder flexion, scaption, abduction, ER Half foam roll: Bilateral  flexion, scaption horizontal abd x 5 Open book x 5, LTR x 5 with arms outstretched Measured AROM  PATIENT EDUCATION: 02/20/2022 Education details: Discussed POC, Left UQ tightness and limitations in ROM, breast swelling, compression bra, MLD Person educated: Patient Education method: Explanation Education comprehension: verbalized understanding  HOME EXERCISE PROGRAM: None given; will resume 4 post of exercises and hold on band exercises right now.  ASSESSMENT:  CLINICAL IMPRESSION  Pt is overall improved but continues to be frustrated with significant tightness in the UT, Pecs and Lats on the left side. She is compliant with her HEP and with her MLD for left breast swelling. Despite her improvements and compliance she continues to have breast pain and swelling. She is also complaining of posterior shoulder pain with signs of impingement which causes mild limitations in shoulder abduction.Marland Kitchen She would like to consider a Flexi touch at this time for left breast swelling, and would like to try DN to see if we may be able to alleviate the tightness/pain/tenderness that remains in the left UQ. She is moving all of her belongings into a new house over the next week and she is also more sore since starting this. She will benefit from skilled PT to address deficits and return to PLOF. OBJECTIVE IMPAIRMENTS: decreased activity tolerance, decreased knowledge of condition, decreased ROM, increased edema, increased fascial restrictions, impaired UE functional use, postural dysfunction, and pain.   ACTIVITY LIMITATIONS: sleeping and reach over head  PARTICIPATION LIMITATIONS: interpersonal relationship  PERSONAL FACTORS: 3+ comorbidities: Left breast cancer s/p infusions and radiation  are also affecting patient's functional outcome.   REHAB POTENTIAL: Excellent  CLINICAL DECISION MAKING: Stable/uncomplicated  EVALUATION COMPLEXITY: Low  GOALS: Goals reviewed with patient? Yes  SHORT TERM GOALS=  LONG TERM GOALS: Target date: 04/04/2023  Pt will be independent in MLD for reducing left breast edema Baseline: Goal status: MET 04/05/2023  2.  Pt will note decreased breast swelling/tenderness by atleast 50% Baseline:  Goal status:MET 05/01/2023 3.  Pt will be able to lie on her left side to sleep for short periods of time Baseline:  Goal status:MET 04/05/2023 4.  Pt will have left shoulder ROM WNL without increased pain with reaching Baseline:  Goal status:ONGOING(nearly achieved but not achieved today due to left shoulder pain  5.  Breast complaints survey will be reduced to no greater than 9 Baseline:  Goal status: Ongoing (21 on 05/03/23)  6. Pt will have more lasting improvement in left UQ muscular tightness after DN  Goal Status:New PLAN:  PT FREQUENCY: 1-2x/week  PT DURATION: 6 weeks  PLANNED INTERVENTIONS: Therapeutic exercises, Therapeutic activity, Patient/Family education, Self Care, Joint mobilization, Orthotic/Fit training, Manual lymph drainage, scar mobilization, Vasopneumatic device, Manual therapy, and Re-evaluation, Dry Needling  PLAN FOR NEXT SESSION: DN to pecs, UT, lats etc,Half foam roll, FlexiTouch demo sent 05/03/23; Pt won't be in new house until June 30. how was peach foam for bra?, review L. breast MLD,  MFR to cording,STM to left UQ, PROM left shoulder, LTR with arms outstretched,  MLD to left breast and review with pt., update HEP prn.  Waynette Buttery, PT 05/03/2023, 1:10 PM

## 2023-05-08 ENCOUNTER — Ambulatory Visit: Payer: BC Managed Care – PPO

## 2023-05-08 DIAGNOSIS — Z483 Aftercare following surgery for neoplasm: Secondary | ICD-10-CM

## 2023-05-08 DIAGNOSIS — M25612 Stiffness of left shoulder, not elsewhere classified: Secondary | ICD-10-CM

## 2023-05-08 DIAGNOSIS — C50412 Malignant neoplasm of upper-outer quadrant of left female breast: Secondary | ICD-10-CM | POA: Diagnosis not present

## 2023-05-08 DIAGNOSIS — I890 Lymphedema, not elsewhere classified: Secondary | ICD-10-CM

## 2023-05-08 DIAGNOSIS — R293 Abnormal posture: Secondary | ICD-10-CM

## 2023-05-08 NOTE — Therapy (Signed)
OUTPATIENT PHYSICAL THERAPY  UPPER EXTREMITY ONCOLOGY TREATMENT  Patient Name: Caitlyn Torres MRN: [id number] DOB:11-16-1960, 62 y.o., female Today's Date: 05/08/2023  END OF SESSION:  PT End of Session - 05/08/23 0908     Visit Number 17    Number of Visits 26    Date for PT Re-Evaluation 06/14/23    PT Start Time 0909   late   Activity Tolerance Patient tolerated treatment well    Behavior During Therapy [facility] for tasks assessed/performed             Past Medical History:  Diagnosis Date   Anxiety    Arthritis    Cancer (HCC) 03/2022   L breast   Depression    Family history of breast cancer 05/15/2022   Family history of pancreatic cancer 05/15/2022   Family history of prostate cancer 05/15/2022   GERD (gastroesophageal reflux disease)    History of hiatal hernia    patient believes she may have been told she has this years ago   Past Surgical History:  Procedure Laterality Date   BREAST BIOPSY  09/19/2022   MM LT RADIOACTIVE SEED EA ADD LESION LOC MAMMO GUIDE 09/19/2022 [facility]   BREAST BIOPSY  09/19/2022   MM LT RADIOACTIVE SEED LOC MAMMO GUIDE 09/19/2022 [facility]   BREAST LUMPECTOMY WITH RADIOACTIVE SEED AND AXILLARY LYMPH NODE DISSECTION Left 09/20/2022   Procedure: LEFT BREAST SEED LUMPECTOMY, LEFT AXILLARY LYMPH NODE DISSECTION;  Surgeon: Harriette Bouillon, MD;  Location: [facility] OR;  Service: General;  Laterality: Left;  GEN & PEC BLOCK   CERVIX SURGERY     Laser removal of pre-cancer cells   LASIK Bilateral    PORTACATH PLACEMENT Right 04/20/2022   Procedure: PORT PLACEMENT;  Surgeon: Harriette Bouillon, MD;  Location: [facility] OR;  Service: General;  Laterality: Right;   Patient Active Problem List   Diagnosis Date Noted   Other fatigue 08/28/2022   Acute pain 08/28/2022   Genetic testing 06/01/2022   Family history of breast cancer 05/15/2022   Family history of pancreatic cancer 05/15/2022   Family history of prostate cancer 05/15/2022    Port-A-Cath in place 04/28/2022   Malignant neoplasm of upper-outer quadrant of left breast in female, estrogen receptor positive (HCC) 03/29/2022      REFERRING PROVIDER: [name], MD  REFERRING DIAG: Left breast swelling/tenderness  THERAPY DIAG:  Malignant neoplasm of upper-outer quadrant of left female breast, unspecified estrogen receptor status (HCC)  Stiffness of left shoulder, not elsewhere classified  Lymphedema of breast  Aftercare following surgery for neoplasm  Abnormal posture  ONSET DATE: Early March  Rationale for Evaluation and Treatment: Rehabilitation  SUBJECTIVE:  SUBJECTIVE STATEMENT:  I feel really stiff this am and not stretched. I have worked all weekend and trying to move.   PERTINENT HISTORY:  03/14/2022:Screening detected left breast masses 1.5 cm and 1.3 cm with enlarged left axillary lymph node, biopsy revealed grade 3 IDC with lymph node being positive, ER 95%, PR 0%, HER2 2+ by IHC and FISH positive with a ratio 2.45 and a copy #4.9, the second biopsy was HER2 negative with a ratio  1. Neoadjuvant chemotherapy with Herceptin Perjeta and letrozole for 8 cycles.  (Our original recommendation is TCHP x6 cycles but patient is not willing to go through chemo) 2. left lumpectomy and ALND 09/20/2022: 0.6 cm IDC 2/14 lymph nodes, margins negative, ER 95%, PR 0%, HER2 positive 3. Followed by adjuvant radiation therapy  4.  Adjuvant antiestrogen therapy with neratinib She had left lumpectomy on 09/20/2022 with 2/14 LN's    PAIN:  Are you having pain? Yes, my neck, back,  NPRS scale:6/10 Pain location: LEFT chest and axilla Pain orientation: Left  PAIN TYPE: tight and sore Pain description: intermittent Aggravating factors: being hugged, reaching, lying on left  side Relieving factors: Compression for cording  PRECAUTIONS: Left UE lymphedema precautions  WEIGHT BEARING RESTRICTIONS: No  FALLS:  Has patient fallen in last 6 months? 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  SENSATION: Light touch: Deficits     POSTURE: forward head, rounded shoulders  UPPER EXTREMITY AROM/PROM:  A/PROM RIGHT   eval   Shoulder extension 42  Shoulder flexion 163  Shoulder abduction 179  Shoulder internal rotation 58  Shoulder external rotation 88    (Blank rows = not tested)  A/PROM LEFT   eval LEFT 03/13/2023 LEFT 04/18/23 LEFT 05/03/23  Shoulder extension 37  50 45  Shoulder flexion 144 159 160 160  Shoulder abduction 140 168 175 167  Shoulder internal rotation 56     Shoulder external rotation 84       (Blank rows = not tested)  CERVICAL AROM: All within normal limits:     UPPER EXTREMITY STRENGTH:   LYMPHEDEMA ASSESSMENTS:   SURGERY TYPE/DATE: Left lumpectomy,09/20/2022  NUMBER OF LYMPH NODES REMOVED: 2+/14  CHEMOTHERAPY: No at her request, Did Herceptin, Perjeta and letrozole  RADIATION:yes but stopped early at her request  HORMONE TREATMENT: YES  INFECTIONS: NO   LYMPHEDEMA ASSESSMENTS:   LANDMARK RIGHT  eval  At axilla   24.8  15 cm proximal to olecranon process   10 cm proximal to olecranon process 22.8  Olecranon process 21.8  15 cm proximal to ulnar styloid process   10 cm proximal to ulnar styloid process 20.6  Just proximal to ulnar styloid process 14.5  Across hand at thumb web space 19.5  At base of 2nd digit 5.7  (Blank rows = not tested)  LANDMARK LEFT  eval  At axilla  25.5  15  cm proximal to olecranon process   10 cm proximal to olecranon process 22.7  Olecranon process 21.5  15 cm proximal to ulnar styloid process   10 cm proximal to ulnar styloid process 19.3  Just proximal to ulnar styloid process 14.45  Across hand at thumb web space 18.3  At base of 2nd digit 5.7  (Blank rows = not tested)  Chest 85 cm  L-DEX LYMPHEDEMA SCREENING: The patient was assessed using the L-Dex machine today to produce a lymphedema index baseline score. The patient will be reassessed on a regular basis (typically every 3 months) to obtain new L-Dex scores. If the score is > 6.5 points away from his/her baseline score indicating onset of subclinical lymphedema, it will be recommended to wear a compression garment for 4 weeks, 12 hours per day and then be reassessed. If the score continues to be > 6.5 points from baseline at reassessment, we will initiate lymphedema treatment. Assessing in this manner has a 95% rate of preventing clinically significant lymphedema.   BREAST COMPLAINTS QUESTIONNAIRE(EVAL) Pain:8 Heaviness:8 Swollen feeling:9 Tense Skin:8 Redness:6 Bra Print:9 Size of Pores:5 Hard feeling: 6 Total:   59  /80 A Score over 9 indicates lymphedema issues in the breast  BREAST COMPLAINTS QUESTIONNAIRE(04/05/2023) Pain:5 Heaviness:4 Swollen feeling:8 Tense Skin:5 Redness:2 Bra Print:9 Size of Po9res: Hard feeling7:  Total:   49  /80 A Score over 9 indicates lymphedema issues in the breast   BREAST COMPLAINTS QUESTIONNAIRE (05/03/23) Pain:3 Heaviness:3 Swollen feeling:3 Tense  Skin:2 Redness:0 Bra Print:3 Size of Pores:3 Hard feeling: 4 Total:   21  /80 A Score over 9 indicates lymphedema issues in the breast   05/03/23  21  TODAY'S TREATMENT:                                                                                                                                          DATE:  Pt permission and consent throughout each step of examination and treatment with modification and draping if requested when working on sensitive areas  05/08/2023  STM bilateral UT, left pectorals/lateral trunk and in SL to left scapular with cupping to left lateral trunk and scapular region Manual traction, suboccipital release MFR to left axilla and forearm area of cording Supine wand flex and scaption x 5 PROM left shoulder flex, scaption, abd, ER  05/03/23 Supine wand flex and scaption x 5 PROM left shoulder flex, scaption, abduction,ER Measured Circumferences Bilaterally and left shoulder AROM STM bilateral UT, left pectorals/lateral trunk and in SL to left scapular region   05/01/2023 STM bilateral UT, left pectorals/lateral trunk and in SL to left scapular with cupping to left pectorals and scapular region Manual traction, suboccipital release MFR to left axilla and forearm area of cording Supine horizontal abd,, fl yellow band x 10 flexion and ER with   04/25/23 STM left UT,pectorals/lateral trunk and in SL to left scapular  with cupping to left pectorals and scapular region MFR to left axilla and forearm area of cording PROM left shoulder flexion, scaption, abduction, ER Half foam roll: Bilateral flexion, scaption horizontal abd x 5 LTR x 4 B  04/18/2023 MFR to left UE cording at axilla, upper arm and forearm STM left UT,pectorals/lateral trunk and in SL to left scapular with cupping to left pectorals and scapular region PROM left shoulder flexion, scaption, abduction, ER Half foam roll: Bilateral flexion, scaption horizontal abd x 5 Open book x 5, LTR x 5  with arms outstretched Measured AROM  PATIENT EDUCATION: 02/20/2022 Education details: Discussed POC, Left UQ tightness and limitations in ROM, breast swelling, compression bra, MLD Person educated: Patient Education method: Explanation Education comprehension: verbalized understanding  HOME EXERCISE PROGRAM: None given; will resume 4 post of exercises and hold on band exercises right now.  ASSESSMENT:  CLINICAL IMPRESSION  Pt is stiff and tight this am from all of her moving activities. Increased tissue tension in bilateral suboccipitals, UT, pecs and Lateral trunk. Pt felt better after stretching and manual techniques but HA remained. Pt to start DN next week.  OBJECTIVE IMPAIRMENTS: decreased activity tolerance, decreased knowledge of condition, decreased ROM, increased edema, increased fascial restrictions, impaired UE functional use, postural dysfunction, and pain.   ACTIVITY LIMITATIONS: sleeping and reach over head  PARTICIPATION LIMITATIONS: interpersonal relationship  PERSONAL FACTORS: 3+ comorbidities: Left breast cancer s/p infusions and radiation  are also affecting patient's functional outcome.   REHAB POTENTIAL: Excellent  CLINICAL DECISION MAKING: Stable/uncomplicated  EVALUATION COMPLEXITY: Low  GOALS: Goals reviewed with patient? Yes  SHORT TERM GOALS= LONG TERM GOALS: Target date: 04/04/2023  Pt will be independent in MLD for reducing left breast edema Baseline: Goal status: MET 04/05/2023  2.  Pt will note decreased breast swelling/tenderness by atleast 50% Baseline:  Goal status:MET 05/01/2023 3.  Pt will be able to lie on her left side to sleep for short periods of time Baseline:  Goal status:MET 04/05/2023 4.  Pt will have left shoulder ROM WNL without increased pain with reaching Baseline:  Goal status:ONGOING(nearly achieved but not achieved today due to left shoulder pain  5.  Breast complaints survey will be reduced to no greater than  9 Baseline:  Goal status: Ongoing (21 on 05/03/23)  6. Pt will have more lasting improvement in left UQ muscular tightness after DN  Goal Status:New PLAN:  PT FREQUENCY: 1-2x/week  PT DURATION: 6 weeks  PLANNED INTERVENTIONS: Therapeutic exercises, Therapeutic activity, Patient/Family education, Self Care, Joint mobilization, Orthotic/Fit training, Manual lymph drainage, scar mobilization, Vasopneumatic device, Manual therapy, and Re-evaluation, Dry Needling  PLAN FOR NEXT SESSION: DN to pecs, UT, lats etc,Half foam roll, FlexiTouch demo sent 05/03/23; Pt won't be in new house until June 30. how was peach foam for bra?, review L. breast MLD,  MFR to cording,STM to left UQ, PROM left shoulder, LTR with arms outstretched,  MLD to left breast and review with pt., update HEP prn.  Waynette Buttery, PT 05/08/2023, 9:08 AM

## 2023-05-12 NOTE — Progress Notes (Signed)
Patient Care Team: Carlean Jews, NP as PCP - General (Family Medicine) Pershing Proud, RN as Oncology Nurse Navigator Donnelly Angelica, RN as Oncology Nurse Navigator [name], MD as Consulting Physician (Hematology and Oncology)  DIAGNOSIS: No diagnosis found.  SUMMARY OF ONCOLOGIC HISTORY: Oncology History  Malignant neoplasm of upper-outer quadrant of left breast in female, estrogen receptor positive (HCC)  03/14/2022 Initial Diagnosis   Screening detected left breast masses 1.5 cm and 1.3 cm with enlarged left axillary lymph node, biopsy revealed grade 3 IDC with lymph node being positive, ER 95%, PR 0%, HER2 2+ by IHC and FISH positive with a ratio 2.45 and a copy #4.9, the second biopsy was HER2 negative with a ratio 2.15 and a copy #3.55   03/29/2022 Cancer Staging   Staging form: Breast, AJCC 8th Edition - Clinical: Stage IIA (cT1c, cN1, cM0, G3, ER+, PR-, HER2+) - Signed by [name], MD on 03/29/2022 Stage prefix: Initial diagnosis Histologic grading system: 3 grade system   03/29/2022 - 09/2022 Anti-estrogen oral therapy   Letrozole daily   04/21/2022 -  Chemotherapy   Herceptin/Perjeta every 21 days initially x 3, will reassess with MRI and then will decide whether to continue or to add chemo.  (Original recommendation was TCHP x 6)      Genetic Testing   [facility] CancerNext-Expanded Panel was Negative. Report date is 05/30/2022.  The CancerNext-Expanded gene panel offered by [facility] and includes sequencing, rearrangement, and RNA analysis for the following 77 genes: AIP, ALK, APC, ATM, AXIN2, BAP1, BARD1, BLM, BMPR1A, BRCA1, BRCA2, BRIP1, CDC73, CDH1, CDK4, CDKN1B, CDKN2A, CHEK2, CTNNA1, DICER1, FANCC, FH, FLCN, GALNT12, KIF1B, LZTR1, MAX, MEN1, MET, MLH1, MSH2, MSH3, MSH6, MUTYH, NBN, NF1, NF2, NTHL1, PALB2, PHOX2B, PMS2, POT1, PRKAR1A, PTCH1, PTEN, RAD51C, RAD51D, RB1, RECQL, RET, SDHA, SDHAF2, SDHB, SDHC, SDHD, SMAD4, SMARCA4, SMARCB1, SMARCE1, STK11,  SUFU, TMEM127, TP53, TSC1, TSC2, VHL and XRCC2 (sequencing and deletion/duplication); EGFR, EGLN1, HOXB13, KIT, MITF, PDGFRA, POLD1, and POLE (sequencing only); EPCAM and GREM1 (deletion/duplication only).    04/21/2022 - 09/14/2022 Chemotherapy   Given neoadjuvantly with Letrozole (declined neoadjuvant chemotherapy with TCHP) Patient is on Treatment Plan : BREAST Trastuzumab  + Pertuzumab q21d x 13 cycles      09/20/2022 Surgery   left lumpectomy and ALND: 0.6 cm IDC 2/14 lymph nodes, margins negative, ER 95%, PR 0%, HER2 positive   10/13/2022 -  Chemotherapy   Patient is on Treatment Plan : BREAST ADO-Trastuzumab Emtansine (Kadcyla) q21d     11/07/2022 - 12/06/2022 Radiation Therapy   Site/dose:   The patient had a prescriptive dose of 50.4 Gy in 28 fractions to the breast and SCLV region using a 4-field approach. This was to be delivered using a 3-D conformal technique. The patient did not receive the planned boost over 10 Gy in 5 fractions.  In total, she completed 36 Gy in 20 of the 33 planned fractions.   02/2023 -  Anti-estrogen oral therapy   Anastrozole daily     CHIEF COMPLIANT:  Kadcyla maintenance cycle 8   INTERVAL HISTORY: Caitlyn Torres is a 62 year old above-mentioned history of HER2 positive breast cancer was currently on Kadcyla. She presents to the clinic for a follow-up.   ALLERGIES:  is allergic to aleve [naproxen sodium], aspirin, macrobid [nitrofurantoin], motrin [ibuprofen], and tylenol [acetaminophen].  MEDICATIONS:  Current Outpatient Medications  Medication Sig Dispense Refill   anastrozole (ARIMIDEX) 1 MG tablet Take 1 tablet (1 mg total) by mouth daily. 90  tablet 3   Brimonidine Tartrate (LUMIFY) 0.025 % SOLN Place 1 drop into both eyes daily as needed (red eyes).     carboxymethylcellulose (REFRESH PLUS) 0.5 % SOLN Place 1 drop into both eyes 3 (three) times daily as needed (dry eyes).     cetirizine (ZYRTEC) 10 MG tablet Take 10 mg by mouth daily.      lidocaine (XYLOCAINE) 2 % solution Swish and swallow 5mL every 4 hours as needed for sore throat 100 mL 0   Lidocaine-Prilocaine &Lido HCl 2.5-2.5 & 3.88 % KIT APPLY TO AFFECTED AREA ONCE AS DIRECTED     Multiple Vitamins-Iron (CHLORELLA) CAPS Take 1 capsule by mouth daily.     ondansetron (ZOFRAN) 8 MG tablet Take 1 tablet (8 mg total) by mouth every 8 (eight) hours as needed for nausea or vomiting. (Patient not taking: Reported on 04/24/2023) 30 tablet 1   OVER THE COUNTER MEDICATION Take 1 capsule by mouth daily. Amla     OVER THE COUNTER MEDICATION Take 1 capsule by mouth daily. Mushroom Complex     OVER THE COUNTER MEDICATION Place 1 Application vaginally daily as needed (moisture). Femininity     QUERCETIN PO Take 1 capsule by mouth daily. (Patient not taking: Reported on 04/24/2023)     TURMERIC CURCUMIN PO Take 1 capsule by mouth daily. (Patient not taking: Reported on 04/24/2023)     No current facility-administered medications for this visit.    PHYSICAL EXAMINATION: ECOG PERFORMANCE STATUS: {CHL ONC ECOG PS:[phone]}  There were no vitals filed for this visit. There were no vitals filed for this visit.  BREAST:*** No palpable masses or nodules in either right or left breasts. No palpable axillary supraclavicular or infraclavicular adenopathy no breast tenderness or nipple discharge. (exam performed in the presence of a chaperone)  LABORATORY DATA:  I have reviewed the data as listed    Latest Ref Rng & Units 04/24/2023    8:49 AM 04/02/2023    8:26 AM 03/09/2023    8:21 AM  CMP  Glucose 70 - 99 mg/dL 99  89  86   BUN 6 - 20 mg/dL 12  8  6    Creatinine 0.44 - 1.00 mg/dL 8.46  9.62  9.52   Sodium 135 - 145 mmol/L 136  137  136   Potassium 3.5 - 5.1 mmol/L 4.0  3.9  3.7   Chloride 98 - 111 mmol/L 103  103  102   CO2 22 - 32 mmol/L 28  29  29    Calcium 8.9 - 10.3 mg/dL 84.1  9.5  9.8   Total Protein 6.5 - 8.1 g/dL 7.5  7.2  6.9   Total Bilirubin 0.3 - 1.2 mg/dL 0.4  0.4  0.4    Alkaline Phos 38 - 126 U/L 61  65  61   AST 15 - 41 U/L 37  34  29   ALT 0 - 44 U/L 31  26  21      Lab Results  Component Value Date   WBC 2.9 (L) 04/24/2023   HGB 13.2 04/24/2023   HCT 39.8 04/24/2023   MCV 82.2 04/24/2023   PLT 171 04/24/2023   NEUTROABS 1.5 (L) 04/24/2023    ASSESSMENT & PLAN:  No problem-specific Assessment & Plan notes found for this encounter.    No orders of the defined types were placed in this encounter.  The patient has a good understanding of the overall plan. she agrees with it. she will call with any  problems that may develop before the next visit here. Total time spent: 30 mins including face to face time and time spent for planning, charting and co-ordination of care   Sherlyn Lick, CMA 05/12/23    I Janan Ridge am acting as a Neurosurgeon for [employer]  ***

## 2023-05-14 ENCOUNTER — Ambulatory Visit: Payer: BC Managed Care – PPO | Attending: Hematology and Oncology

## 2023-05-14 DIAGNOSIS — I890 Lymphedema, not elsewhere classified: Secondary | ICD-10-CM | POA: Insufficient documentation

## 2023-05-14 DIAGNOSIS — R293 Abnormal posture: Secondary | ICD-10-CM | POA: Insufficient documentation

## 2023-05-14 DIAGNOSIS — R252 Cramp and spasm: Secondary | ICD-10-CM | POA: Insufficient documentation

## 2023-05-14 DIAGNOSIS — M25612 Stiffness of left shoulder, not elsewhere classified: Secondary | ICD-10-CM | POA: Insufficient documentation

## 2023-05-14 DIAGNOSIS — Z483 Aftercare following surgery for neoplasm: Secondary | ICD-10-CM | POA: Insufficient documentation

## 2023-05-14 DIAGNOSIS — C50412 Malignant neoplasm of upper-outer quadrant of left female breast: Secondary | ICD-10-CM | POA: Diagnosis present

## 2023-05-14 DIAGNOSIS — M6281 Muscle weakness (generalized): Secondary | ICD-10-CM | POA: Insufficient documentation

## 2023-05-14 NOTE — Therapy (Signed)
OUTPATIENT PHYSICAL THERAPY  UPPER EXTREMITY ONCOLOGY TREATMENT  Patient Name: Caitlyn Torres MRN: [id number] DOB:26-Feb-1961, 62 y.o., female Today's Date: 05/14/2023  END OF SESSION:  PT End of Session - 05/14/23 1026     Visit Number 18    Number of Visits 26    Date for PT Re-Evaluation 06/14/23    Authorization Type [employer]    PT Start Time 1020    PT Stop Time 1100    PT Time Calculation (min) 40 min    Activity Tolerance Patient tolerated treatment well    Behavior During Therapy WFL for tasks assessed/performed             Past Medical History:  Diagnosis Date   Anxiety    Arthritis    Cancer (HCC) 03/2022   L breast   Depression    Family history of breast cancer 05/15/2022   Family history of pancreatic cancer 05/15/2022   Family history of prostate cancer 05/15/2022   GERD (gastroesophageal reflux disease)    History of hiatal hernia    patient believes she may have been told she has this years ago   Past Surgical History:  Procedure Laterality Date   BREAST BIOPSY  09/19/2022   MM LT RADIOACTIVE SEED EA ADD LESION LOC MAMMO GUIDE 09/19/2022 [facility]   BREAST BIOPSY  09/19/2022   MM LT RADIOACTIVE SEED LOC MAMMO GUIDE 09/19/2022 [facility]   BREAST LUMPECTOMY WITH RADIOACTIVE SEED AND AXILLARY LYMPH NODE DISSECTION Left 09/20/2022   Procedure: LEFT BREAST SEED LUMPECTOMY, LEFT AXILLARY LYMPH NODE DISSECTION;  Surgeon: Harriette Bouillon, MD;  Location: [facility] OR;  Service: General;  Laterality: Left;  GEN & PEC BLOCK   CERVIX SURGERY     Laser removal of pre-cancer cells   LASIK Bilateral    PORTACATH PLACEMENT Right 04/20/2022   Procedure: PORT PLACEMENT;  Surgeon: Harriette Bouillon, MD;  Location: [facility] OR;  Service: General;  Laterality: Right;   Patient Active Problem List   Diagnosis Date Noted   Other fatigue 08/28/2022   Acute pain 08/28/2022   Genetic testing 06/01/2022   Family history of breast cancer 05/15/2022   Family history of  pancreatic cancer 05/15/2022   Family history of prostate cancer 05/15/2022   Port-A-Cath in place 04/28/2022   Malignant neoplasm of upper-outer quadrant of left breast in female, estrogen receptor positive (HCC) 03/29/2022      REFERRING PROVIDER: [name], MD  REFERRING DIAG: Left breast swelling/tenderness  THERAPY DIAG:  Stiffness of left shoulder, not elsewhere classified  Aftercare following surgery for neoplasm  Abnormal posture  Muscle weakness (generalized)  Cramp and spasm  ONSET DATE: Early March  Rationale for Evaluation and Treatment: Rehabilitation  SUBJECTIVE:  SUBJECTIVE STATEMENT:  I feel really stiff this am and not stretched. I have worked all weekend and trying to move.   PERTINENT HISTORY:  03/14/2022:Screening detected left breast masses 1.5 cm and 1.3 cm with enlarged left axillary lymph node, biopsy revealed grade 3 IDC with lymph node being positive, ER 95%, PR 0%, HER2 2+ by IHC and FISH positive with a ratio 2.45 and a copy #4.9, the second biopsy was HER2 negative with a ratio  1. Neoadjuvant chemotherapy with Herceptin Perjeta and letrozole for 8 cycles.  (Our original recommendation is TCHP x6 cycles but patient is not willing to go through chemo) 2. left lumpectomy and ALND 09/20/2022: 0.6 cm IDC 2/14 lymph nodes, margins negative, ER 95%, PR 0%, HER2 positive 3. Followed by adjuvant radiation therapy  4.  Adjuvant antiestrogen therapy with neratinib She had left lumpectomy on 09/20/2022 with 2/14 LN's    PAIN:  Are you having pain? Yes, my neck, back,  NPRS scale:6/10 Pain location: LEFT chest and axilla Pain orientation: Left  PAIN TYPE: tight and sore Pain description: intermittent Aggravating factors: being hugged, reaching, lying on left  side Relieving factors: Compression for cording  PRECAUTIONS: Left UE lymphedema precautions  WEIGHT BEARING RESTRICTIONS: No  FALLS:  Has patient fallen in last 6 months? 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  SENSATION: Light touch: Deficits     POSTURE: forward head, rounded shoulders  UPPER EXTREMITY AROM/PROM:  A/PROM RIGHT   eval   Shoulder extension 42  Shoulder flexion 163  Shoulder abduction 179  Shoulder internal rotation 58  Shoulder external rotation 88    (Blank rows = not tested)  A/PROM LEFT   eval LEFT 03/13/2023 LEFT 04/18/23 LEFT 05/03/23  Shoulder extension 37  50 45  Shoulder flexion 144 159 160 160  Shoulder abduction 140 168 175 167  Shoulder internal rotation 56     Shoulder external rotation 84       (Blank rows = not tested)  CERVICAL AROM: All within normal limits:     UPPER EXTREMITY STRENGTH:   LYMPHEDEMA ASSESSMENTS:   SURGERY TYPE/DATE: Left lumpectomy,09/20/2022  NUMBER OF LYMPH NODES REMOVED: 2+/14  CHEMOTHERAPY: No at her request, Did Herceptin, Perjeta and letrozole  RADIATION:yes but stopped early at her request  HORMONE TREATMENT: YES  INFECTIONS: NO   LYMPHEDEMA ASSESSMENTS:   LANDMARK RIGHT  eval  At axilla   24.8  15 cm proximal to olecranon process   10 cm proximal to olecranon process 22.8  Olecranon process 21.8  15 cm proximal to ulnar styloid process   10 cm proximal to ulnar styloid process 20.6  Just proximal to ulnar styloid process 14.5  Across hand at thumb web space 19.5  At base of 2nd digit 5.7  (Blank rows = not tested)  LANDMARK LEFT  eval  At axilla  25.5  15  cm proximal to olecranon process   10 cm proximal to olecranon process 22.7  Olecranon process 21.5  15 cm proximal to ulnar styloid process   10 cm proximal to ulnar styloid process 19.3  Just proximal to ulnar styloid process 14.45  Across hand at thumb web space 18.3  At base of 2nd digit 5.7  (Blank rows = not tested)  Chest 85 cm  L-DEX LYMPHEDEMA SCREENING: The patient was assessed using the L-Dex machine today to produce a lymphedema index baseline score. The patient will be reassessed on a regular basis (typically every 3 months) to obtain new L-Dex scores. If the score is > 6.5 points away from his/her baseline score indicating onset of subclinical lymphedema, it will be recommended to wear a compression garment for 4 weeks, 12 hours per day and then be reassessed. If the score continues to be > 6.5 points from baseline at reassessment, we will initiate lymphedema treatment. Assessing in this manner has a 95% rate of preventing clinically significant lymphedema.   BREAST COMPLAINTS QUESTIONNAIRE(EVAL) Pain:8 Heaviness:8 Swollen feeling:9 Tense Skin:8 Redness:6 Bra Print:9 Size of Pores:5 Hard feeling: 6 Total:   59  /80 A Score over 9 indicates lymphedema issues in the breast  BREAST COMPLAINTS QUESTIONNAIRE(04/05/2023) Pain:5 Heaviness:4 Swollen feeling:8 Tense Skin:5 Redness:2 Bra Print:9 Size of Po9res: Hard feeling7:  Total:   49  /80 A Score over 9 indicates lymphedema issues in the breast   BREAST COMPLAINTS QUESTIONNAIRE (05/03/23) Pain:3 Heaviness:3 Swollen feeling:3 Tense  Skin:2 Redness:0 Bra Print:3 Size of Pores:3 Hard feeling: 4 Total:   21  /80 A Score over 9 indicates lymphedema issues in the breast   05/03/23  21  TODAY'S TREATMENT:                                                                                                                                          DATE:  05/14/23 Seated physio ball roll outs x 10 fwd, and to each side holding 2-3 seconds Supine wand exercises x 10 each holding 2-3 sec (flexion, abduction and ER) Standing towel stretch for left shoulder IR x 10 holding 2-3 sec Supine PROM to C spine with manual upper trap and levator stretch STM to lat area and lateral incsion left side in right side lying Trigger Point Dry-Needling  Treatment instructions: Expect mild to moderate muscle soreness. S/S of pneumothorax if dry needled over a lung field, and to seek immediate medical attention should they occur. Patient verbalized understanding of these instructions and education. Patient Consent Given: Yes Education handout provided: Yes Muscles treated: bilateral upper traps, left pec major and minor Electrical stimulation performed: No Parameters: N/A Treatment response/outcome: Skilled palpation used to identify taut bands and trigger points.  Once identified, dry needling techniques used to treat these areas.  Twitch response ellicited along with palpable elongation of muscle.  Following treatment, patient reported decreased muscle  tension and pain in the cervical spine and pectoralis area.     Pt permission and consent throughout each step of examination and treatment with modification and draping if requested when working on sensitive areas  05/08/2023  STM bilateral UT, left pectorals/lateral trunk and in SL to left scapular with cupping to left lateral trunk and scapular region Manual traction, suboccipital release MFR to left axilla and forearm area of cording Supine wand flex and scaption x 5 PROM left shoulder flex,  scaption, abd, ER  05/03/23 Supine wand flex and scaption x 5 PROM left shoulder flex, scaption, abduction,ER Measured Circumferences Bilaterally and left shoulder AROM STM bilateral UT, left pectorals/lateral trunk and in SL to left scapular region   05/01/2023 STM bilateral UT, left pectorals/lateral trunk and in SL to left scapular with cupping to left pectorals and scapular region Manual traction, suboccipital release MFR to left axilla and forearm area of cording Supine horizontal abd,, fl yellow band x 10 flexion and ER with   04/25/23 STM left UT,pectorals/lateral trunk and in SL to left scapular with cupping to left pectorals and scapular region MFR to left axilla and forearm area of cording PROM left shoulder flexion, scaption, abduction, ER Half foam roll: Bilateral flexion, scaption horizontal abd x 5 LTR x 4 B  04/18/2023 MFR to left UE cording at axilla, upper arm and forearm STM left UT,pectorals/lateral trunk and in SL to left scapular with cupping to left pectorals and scapular region PROM left shoulder flexion, scaption, abduction, ER Half foam roll: Bilateral flexion, scaption horizontal abd x 5 Open book x 5, LTR x 5 with arms outstretched Measured AROM  PATIENT EDUCATION: 02/20/2022 Education details: Discussed POC, Left UQ tightness and limitations in ROM, breast swelling, compression bra, MLD Person educated: Patient Education method: Explanation Education comprehension: verbalized understanding  HOME EXERCISE PROGRAM: None given; will resume 4 post of exercises and hold on band exercises right now.  ASSESSMENT:  CLINICAL IMPRESSION  Beth arrived with less pain today.  She admitted she still has a lot of pain at the base of her head but no headache today.  She had good response to DN with multiple twitch responses.  Post treatment, she reported decreased pain in the c spine and shoulders/neck area.  She would benefit from continued skilled PT for left  shoulder ROM, STM, and DN to reduce adhesions and avoid mechanical neck and shoulder issues secondary to surgery, chemo and radiation.    OBJECTIVE IMPAIRMENTS: decreased activity tolerance, decreased knowledge of condition, decreased ROM, increased edema, increased fascial restrictions, impaired UE functional use, postural dysfunction, and pain.   ACTIVITY LIMITATIONS: sleeping and reach over head  PARTICIPATION LIMITATIONS: interpersonal relationship  PERSONAL FACTORS: 3+ comorbidities: Left breast cancer s/p infusions and radiation  are also affecting patient's functional outcome.   REHAB POTENTIAL: Excellent  CLINICAL DECISION MAKING: Stable/uncomplicated  EVALUATION COMPLEXITY: Low  GOALS: Goals reviewed with patient? Yes  SHORT TERM GOALS= LONG TERM GOALS: Target date: 04/04/2023  Pt will be independent in MLD for reducing left breast edema Baseline: Goal status: MET 04/05/2023  2.  Pt will note decreased breast swelling/tenderness by atleast 50% Baseline:  Goal status:MET 05/01/2023 3.  Pt will be able to lie on her left side to sleep for short periods of time Baseline:  Goal status:MET 04/05/2023 4.  Pt will have left shoulder ROM WNL without increased pain with reaching Baseline:  Goal status:ONGOING(nearly achieved but not achieved today due to left shoulder pain  5.  Breast complaints survey will be reduced to no greater than 9 Baseline:  Goal status: Ongoing (21 on 05/03/23)  6. Pt will have more lasting improvement in left UQ muscular tightness after DN  Goal Status:New PLAN:  PT FREQUENCY: 1-2x/week  PT DURATION: 6 weeks  PLANNED INTERVENTIONS: Therapeutic exercises, Therapeutic activity, Patient/Family education, Self Care, Joint mobilization, Orthotic/Fit training, Manual lymph drainage, scar mobilization, Vasopneumatic device, Manual therapy, and Re-evaluation, Dry Needling  PLAN FOR NEXT SESSION: Ortho: DN to pecs, UT, lats etc,Uppert trap and levator  stretch. Cancer rehab: FlexiTouch demo sent 05/03/23; how was peach foam for bra?, review L. breast MLD,  MFR to cording,STM to left UQ, PROM left shoulder, LTR with arms outstretched,  MLD to left breast and review with pt., update HEP prn.  Victorino Dike B. Mohd Clemons, PT 05/14/23 11:19 AM [facility] [address] [place], [place] [address] Phone # [phone] Fax [phone]

## 2023-05-15 ENCOUNTER — Inpatient Hospital Stay: Payer: BC Managed Care – PPO

## 2023-05-15 ENCOUNTER — Inpatient Hospital Stay: Payer: BC Managed Care – PPO | Attending: Hematology and Oncology

## 2023-05-15 ENCOUNTER — Other Ambulatory Visit: Payer: Self-pay

## 2023-05-15 ENCOUNTER — Inpatient Hospital Stay (HOSPITAL_BASED_OUTPATIENT_CLINIC_OR_DEPARTMENT_OTHER): Payer: BC Managed Care – PPO | Admitting: Hematology and Oncology

## 2023-05-15 VITALS — BP 135/62 | HR 69 | Temp 97.9°F | Resp 18 | Ht 63.0 in | Wt 117.9 lb

## 2023-05-15 DIAGNOSIS — R5383 Other fatigue: Secondary | ICD-10-CM | POA: Diagnosis not present

## 2023-05-15 DIAGNOSIS — Z79811 Long term (current) use of aromatase inhibitors: Secondary | ICD-10-CM | POA: Diagnosis not present

## 2023-05-15 DIAGNOSIS — Z170 Estrogen receptor positive status [ER+]: Secondary | ICD-10-CM | POA: Insufficient documentation

## 2023-05-15 DIAGNOSIS — Z5112 Encounter for antineoplastic immunotherapy: Secondary | ICD-10-CM | POA: Diagnosis not present

## 2023-05-15 DIAGNOSIS — Z923 Personal history of irradiation: Secondary | ICD-10-CM | POA: Insufficient documentation

## 2023-05-15 DIAGNOSIS — Z79899 Other long term (current) drug therapy: Secondary | ICD-10-CM | POA: Diagnosis not present

## 2023-05-15 DIAGNOSIS — D72819 Decreased white blood cell count, unspecified: Secondary | ICD-10-CM | POA: Diagnosis not present

## 2023-05-15 DIAGNOSIS — C50412 Malignant neoplasm of upper-outer quadrant of left female breast: Secondary | ICD-10-CM | POA: Insufficient documentation

## 2023-05-15 LAB — CBC WITH DIFFERENTIAL (CANCER CENTER ONLY)
Abs Immature Granulocytes: 0.01 10*3/uL (ref 0.00–0.07)
Basophils Absolute: 0.1 10*3/uL (ref 0.0–0.1)
Basophils Relative: 2 %
Eosinophils Absolute: 0.1 10*3/uL (ref 0.0–0.5)
Eosinophils Relative: 4 %
HCT: 39.5 % (ref 36.0–46.0)
Hemoglobin: 13.2 g/dL (ref 12.0–15.0)
Immature Granulocytes: 0 %
Lymphocytes Relative: 33 %
Lymphs Abs: 1 10*3/uL (ref 0.7–4.0)
MCH: 27.7 pg (ref 26.0–34.0)
MCHC: 33.4 g/dL (ref 30.0–36.0)
MCV: 82.8 fL (ref 80.0–100.0)
Monocytes Absolute: 0.4 10*3/uL (ref 0.1–1.0)
Monocytes Relative: 12 %
Neutro Abs: 1.5 10*3/uL — ABNORMAL LOW (ref 1.7–7.7)
Neutrophils Relative %: 49 %
Platelet Count: 152 10*3/uL (ref 150–400)
RBC: 4.77 MIL/uL (ref 3.87–5.11)
RDW: 15.5 % (ref 11.5–15.5)
WBC Count: 3.1 10*3/uL — ABNORMAL LOW (ref 4.0–10.5)
nRBC: 0 % (ref 0.0–0.2)

## 2023-05-15 LAB — CMP (CANCER CENTER ONLY)
ALT: 32 U/L (ref 0–44)
AST: 42 U/L — ABNORMAL HIGH (ref 15–41)
Albumin: 4.1 g/dL (ref 3.5–5.0)
Alkaline Phosphatase: 59 U/L (ref 38–126)
Anion gap: 5 (ref 5–15)
BUN: 11 mg/dL (ref 6–20)
CO2: 28 mmol/L (ref 22–32)
Calcium: 9.1 mg/dL (ref 8.9–10.3)
Chloride: 106 mmol/L (ref 98–111)
Creatinine: 0.62 mg/dL (ref 0.44–1.00)
GFR, Estimated: >60 mL/min (ref >60)
Glucose, Bld: 88 mg/dL (ref 70–99)
Potassium: 3.9 mmol/L (ref 3.5–5.1)
Sodium: 139 mmol/L (ref 135–145)
Total Bilirubin: 0.4 mg/dL (ref 0.3–1.2)
Total Protein: 6.6 g/dL (ref 6.5–8.1)

## 2023-05-15 MED ORDER — SODIUM CHLORIDE 0.9 % IV SOLN: Freq: Once | INTRAVENOUS | Status: AC

## 2023-05-15 MED ORDER — PROCHLORPERAZINE MALEATE 10 MG PO TABS: 10.0000 mg | ORAL_TABLET | Freq: Once | ORAL | Status: DC

## 2023-05-15 MED ORDER — SODIUM CHLORIDE 0.9 % IV SOLN
3.0000 mg/kg | Freq: Once | INTRAVENOUS | Status: AC
  Administered 2023-05-15: 160 mg via INTRAVENOUS
  Filled 2023-05-15: qty 8

## 2023-05-15 MED ORDER — HEPARIN SOD (PORK) LOCK FLUSH 100 UNIT/ML IV SOLN
500.0000 [IU] | Freq: Once | INTRAVENOUS | Status: AC | PRN
  Administered 2023-05-15: 500 [IU]

## 2023-05-15 MED ORDER — DIPHENHYDRAMINE HCL 25 MG PO CAPS
25.0000 mg | ORAL_CAPSULE | Freq: Once | ORAL | Status: AC
  Administered 2023-05-15: 25 mg via ORAL
  Filled 2023-05-15: qty 1

## 2023-05-15 MED ORDER — SODIUM CHLORIDE 0.9% FLUSH
10.0000 mL | INTRAVENOUS | Status: DC | PRN
  Administered 2023-05-15: 10 mL

## 2023-05-15 NOTE — Assessment & Plan Note (Addendum)
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   Treatment plan based on multidisciplinary tumor board: 1. Neoadjuvant chemotherapy with Herceptin Perjeta and letrozole for 8 cycles.  (Our original recommendation is TCHP x6 cycles but patient is not willing to go through chemo) 2. left lumpectomy and ALND 09/20/2022: 0.6 cm IDC 2/14 lymph nodes, margins negative, ER 95%, PR 0%, HER2 positive 3. Followed by adjuvant radiation therapy 11/07/22-12/06/22  4.  Adjuvant antiestrogen therapy anastrozole 5.  Consideration for neratinib ------------------------------------------------------------------------------------------ Kadcyla Toxicities: (Today is the last cycle) Fatigue: managing with energy conservation Echo 03/28/2023: EF 55%.  Leukopenia: mild, monitoring, okay to treat with an ANC of 1.3 today.  I reduced the dosage of Kadcyla today.     Anastrozole Toxicities:  Fatigue: improving with time, managed with energy conservation   Patient is doing Signatera for minimal residual disease monitoring.  I discussed with the patient about neratinib.  She will read more about it and when she comes back in 3 months for survivorship care plan visit she will make a final decision on that.  I discussed with her that we will start at a lower dose and titrate upwards. I discussed the importance of neratinib in preventing brain metastases.  Return to clinic in 3 weeks for her last cycle of Kadcyla.

## 2023-05-22 ENCOUNTER — Encounter: Payer: Self-pay | Admitting: *Deleted

## 2023-05-22 DIAGNOSIS — C50412 Malignant neoplasm of upper-outer quadrant of left female breast: Secondary | ICD-10-CM

## 2023-05-24 ENCOUNTER — Encounter: Payer: Self-pay | Admitting: Rehabilitative and Restorative Service Providers"

## 2023-05-24 ENCOUNTER — Ambulatory Visit: Payer: BC Managed Care – PPO | Admitting: Rehabilitative and Restorative Service Providers"

## 2023-05-24 DIAGNOSIS — C50412 Malignant neoplasm of upper-outer quadrant of left female breast: Secondary | ICD-10-CM

## 2023-05-24 DIAGNOSIS — R252 Cramp and spasm: Secondary | ICD-10-CM

## 2023-05-24 DIAGNOSIS — R293 Abnormal posture: Secondary | ICD-10-CM

## 2023-05-24 DIAGNOSIS — I890 Lymphedema, not elsewhere classified: Secondary | ICD-10-CM

## 2023-05-24 DIAGNOSIS — M25612 Stiffness of left shoulder, not elsewhere classified: Secondary | ICD-10-CM

## 2023-05-24 DIAGNOSIS — Z483 Aftercare following surgery for neoplasm: Secondary | ICD-10-CM

## 2023-05-24 NOTE — Therapy (Signed)
OUTPATIENT PHYSICAL THERAPY  UPPER EXTREMITY ONCOLOGY TREATMENT  Patient Name: Caitlyn Torres MRN: [id number] DOB:09-28-1961, 62 y.o., female Today's Date: 05/24/2023  END OF SESSION:  PT End of Session - 05/24/23 0937     Visit Number 19    Date for PT Re-Evaluation 06/14/23    Authorization Type [employer]    PT Start Time [phone]    PT Stop Time 1010    PT Time Calculation (min) 39 min    Activity Tolerance Patient tolerated treatment well    Behavior During Therapy [facility] for tasks assessed/performed             Past Medical History:  Diagnosis Date   Anxiety    Arthritis    Cancer (HCC) 03/2022   L breast   Depression    Family history of breast cancer 05/15/2022   Family history of pancreatic cancer 05/15/2022   Family history of prostate cancer 05/15/2022   GERD (gastroesophageal reflux disease)    History of hiatal hernia    patient believes she may have been told she has this years ago   Past Surgical History:  Procedure Laterality Date   BREAST BIOPSY  09/19/2022   MM LT RADIOACTIVE SEED EA ADD LESION LOC MAMMO GUIDE 09/19/2022 [facility]   BREAST BIOPSY  09/19/2022   MM LT RADIOACTIVE SEED LOC MAMMO GUIDE 09/19/2022 [facility]   BREAST LUMPECTOMY WITH RADIOACTIVE SEED AND AXILLARY LYMPH NODE DISSECTION Left 09/20/2022   Procedure: LEFT BREAST SEED LUMPECTOMY, LEFT AXILLARY LYMPH NODE DISSECTION;  Surgeon: Harriette Bouillon, MD;  Location: [facility] OR;  Service: General;  Laterality: Left;  GEN & PEC BLOCK   CERVIX SURGERY     Laser removal of pre-cancer cells   LASIK Bilateral    PORTACATH PLACEMENT Right 04/20/2022   Procedure: PORT PLACEMENT;  Surgeon: Harriette Bouillon, MD;  Location: [facility] OR;  Service: General;  Laterality: Right;   Patient Active Problem List   Diagnosis Date Noted   Other fatigue 08/28/2022   Acute pain 08/28/2022   Genetic testing 06/01/2022   Family history of breast cancer 05/15/2022   Family history of pancreatic cancer 05/15/2022    Family history of prostate cancer 05/15/2022   Port-A-Cath in place 04/28/2022   Malignant neoplasm of upper-outer quadrant of left breast in female, estrogen receptor positive (HCC) 03/29/2022      REFERRING PROVIDER: [name], MD  REFERRING DIAG: Left breast swelling/tenderness  THERAPY DIAG:  Stiffness of left shoulder, not elsewhere classified  Aftercare following surgery for neoplasm  Abnormal posture  Cramp and spasm  Malignant neoplasm of upper-outer quadrant of left female breast, unspecified estrogen receptor status (HCC)  Lymphedema of breast  ONSET DATE: Early March  Rationale for Evaluation and Treatment: Rehabilitation  SUBJECTIVE:  SUBJECTIVE STATEMENT:  Pt states that dry needling seems to be helping some, but she is having increased pain today.   PERTINENT HISTORY:  03/14/2022:Screening detected left breast masses 1.5 cm and 1.3 cm with enlarged left axillary lymph node, biopsy revealed grade 3 IDC with lymph node being positive, ER 95%, PR 0%, HER2 2+ by IHC and FISH positive with a ratio 2.45 and a copy #4.9, the second biopsy was HER2 negative with a ratio  1. Neoadjuvant chemotherapy with Herceptin Perjeta and letrozole for 8 cycles.  (Our original recommendation is TCHP x6 cycles but patient is not willing to go through chemo) 2. left lumpectomy and ALND 09/20/2022: 0.6 cm IDC 2/14 lymph nodes, margins negative, ER 95%, PR 0%, HER2 positive 3. Followed by adjuvant radiation therapy  4.  Adjuvant antiestrogen therapy with neratinib She had left lumpectomy on 09/20/2022 with 2/14 LN's    PAIN:  Are you having pain? Yes, my neck, back,  NPRS scale:7-8/10 Pain location: LEFT chest and axilla Pain orientation: Left  PAIN TYPE: tight and sore Pain description:  intermittent Aggravating factors: being hugged, reaching, lying on left side Relieving factors: Compression for cording  PRECAUTIONS: Left UE lymphedema precautions  WEIGHT BEARING RESTRICTIONS: No  FALLS:  Has patient fallen in last 6 months? 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  SENSATION: Light touch: Deficits     POSTURE: forward head, rounded shoulders  UPPER EXTREMITY AROM/PROM:  A/PROM RIGHT   eval   Shoulder extension 42  Shoulder flexion 163  Shoulder abduction 179  Shoulder internal rotation 58  Shoulder external rotation 88    (Blank rows = not tested)  A/PROM LEFT   eval LEFT 03/13/2023 LEFT 04/18/23 LEFT 05/03/23  Shoulder extension 37  50 45  Shoulder flexion 144 159 160 160  Shoulder abduction 140 168 175 167  Shoulder internal rotation 56     Shoulder external rotation 84       (Blank rows = not tested)  CERVICAL AROM: All within normal limits:     UPPER EXTREMITY STRENGTH:   LYMPHEDEMA ASSESSMENTS:   SURGERY TYPE/DATE: Left lumpectomy,09/20/2022  NUMBER OF LYMPH NODES REMOVED: 2+/14  CHEMOTHERAPY: No at her request, Did Herceptin, Perjeta and letrozole  RADIATION:yes but stopped early at her request  HORMONE TREATMENT:  YES  INFECTIONS: NO   LYMPHEDEMA ASSESSMENTS:   LANDMARK RIGHT  eval  At axilla  24.8  15 cm proximal to olecranon process   10 cm proximal to olecranon process 22.8  Olecranon process 21.8  15 cm proximal to ulnar styloid process   10 cm proximal to ulnar styloid process 20.6  Just proximal to ulnar styloid process 14.5  Across hand at thumb web space 19.5  At base of 2nd digit 5.7  (Blank rows = not tested)  LANDMARK LEFT  eval  At axilla  25.5  15 cm  proximal to olecranon process   10 cm proximal to olecranon process 22.7  Olecranon process 21.5  15 cm proximal to ulnar styloid process   10 cm proximal to ulnar styloid process 19.3  Just proximal to ulnar styloid process 14.45  Across hand at thumb web space 18.3  At base of 2nd digit 5.7  (Blank rows = not tested)  Chest 85 cm  L-DEX LYMPHEDEMA SCREENING: The patient was assessed using the L-Dex machine today to produce a lymphedema index baseline score. The patient will be reassessed on a regular basis (typically every 3 months) to obtain new L-Dex scores. If the score is > 6.5 points away from his/her baseline score indicating onset of subclinical lymphedema, it will be recommended to wear a compression garment for 4 weeks, 12 hours per day and then be reassessed. If the score continues to be > 6.5 points from baseline at reassessment, we will initiate lymphedema treatment. Assessing in this manner has a 95% rate of preventing clinically significant lymphedema.   BREAST COMPLAINTS QUESTIONNAIRE(EVAL) Pain:8 Heaviness:8 Swollen feeling:9 Tense Skin:8 Redness:6 Bra Print:9 Size of Pores:5 Hard feeling: 6 Total:   59  /80 A Score over 9 indicates lymphedema issues in the breast  BREAST COMPLAINTS QUESTIONNAIRE(04/05/2023) Pain:5 Heaviness:4 Swollen feeling:8 Tense Skin:5 Redness:2 Bra Print:9 Size of Po9res: Hard feeling7:  Total:   49  /80 A Score over 9 indicates lymphedema issues in the  breast   BREAST COMPLAINTS QUESTIONNAIRE (05/03/23) Pain:3 Heaviness:3 Swollen feeling:3 Tense Skin:2 Redness:0 Bra Print:3 Size of Pores:3 Hard feeling: 4 Total:   21  /80 A Score over 9 indicates lymphedema issues in the breast   05/03/23  21  TODAY'S TREATMENT:                                                                                                                                          DATE:  05/24/2023 Seated 3 way blue pball rollouts x10 each way with 2 sec hold Supine shoulder flexion with dowel x10 with 2 sec hold Right sidelying for left shoulder abduction with 1# x10 Right sidelying for left shoulder ER with 1# x10 Supine D2 shoulder pattern with 1# x10 bilat Supine serratus punch with 1# 2x10 bilat Supine cervical retraction 2x10 Right sidelying open book x10 Trigger Point Dry-Needling  Treatment instructions: Expect mild to moderate muscle soreness. S/S of pneumothorax if dry needled over a lung field, and to seek immediate medical attention should they occur. Patient verbalized understanding of these instructions and education. Patient Consent Given: Yes Education handout provided: Previously provided Muscles treated: bilat cervical and thoracic multifidi, bilat upper traps, left rhomboids, left lats Electrical stimulation performed: Yes Parameters: N/A Treatment response/outcome: Utilized skilled palpation to identify bony landmarks and trigger points.  Able to illicit twitch response and muscle elongation.  Soft tissue mobilization following to further promote tissue elongation.  DATE:  05/14/23 Seated physio ball roll outs x 10 fwd, and to each side holding 2-3 seconds Supine wand exercises x 10 each holding 2-3 sec (flexion, abduction and ER) Standing towel stretch for left shoulder IR x 10 holding 2-3 sec Supine PROM to C spine with manual upper trap and levator stretch STM to lat area and lateral incsion left side in right side lying Trigger  Point Dry-Needling  Treatment instructions: Expect mild to moderate muscle soreness. S/S of pneumothorax if dry needled over a lung field, and to seek immediate medical attention should they occur. Patient verbalized understanding of these instructions and education. Patient Consent Given: Yes Education handout provided: Yes Muscles treated: bilateral upper traps, left pec major and minor Electrical stimulation performed: No Parameters: N/A Treatment response/outcome: Skilled palpation used to identify taut bands and trigger points.  Once identified, dry needling techniques used to treat these areas.  Twitch response ellicited along with palpable elongation of muscle.  Following treatment, patient reported decreased muscle tension and pain in the cervical spine and pectoralis area.     Pt permission and consent throughout each step of examination and treatment with modification and draping if requested when working on sensitive areas  05/08/2023  STM bilateral UT, left pectorals/lateral trunk and in SL to left scapular with cupping to left lateral trunk and scapular region Manual traction, suboccipital release MFR to left axilla and forearm area of cording Supine wand flex and scaption x 5 PROM left shoulder flex, scaption, abd, ER   PATIENT EDUCATION: 02/20/2022 Education details: Discussed POC, Left UQ tightness and limitations in ROM, breast swelling, compression bra, MLD Person educated: Patient Education method: Explanation Education comprehension: verbalized understanding  HOME EXERCISE PROGRAM: None given; will resume 4 post of exercises and hold on band exercises right now.  ASSESSMENT:  CLINICAL IMPRESSION  Beth arrived today with some increased pain reported.  Patient able to progress with strengthening during session.  Patient with increased tension noted specifically on cervical and thoracic multifidi.  After dry needling and soft tissue mobilization, patient reported  improved tissue elongation and her pain decreased to 4/10 by end of session.  Patient continues to require skilled PT to progress towards goal related activities.  OBJECTIVE IMPAIRMENTS: decreased activity tolerance, decreased knowledge of condition, decreased ROM, increased edema, increased fascial restrictions, impaired UE functional use, postural dysfunction, and pain.   ACTIVITY LIMITATIONS: sleeping and reach over head  PARTICIPATION LIMITATIONS: interpersonal relationship  PERSONAL FACTORS: 3+ comorbidities: Left breast cancer s/p infusions and radiation  are also affecting patient's functional outcome.   REHAB POTENTIAL: Excellent  CLINICAL DECISION MAKING: Stable/uncomplicated  EVALUATION COMPLEXITY: Low  GOALS: Goals reviewed with patient? Yes  SHORT TERM GOALS= LONG TERM GOALS: Target date: 04/04/2023  Pt will be independent in MLD for reducing left breast edema Baseline: Goal status: MET 04/05/2023  2.  Pt will note decreased breast swelling/tenderness by atleast 50% Baseline:  Goal status:MET 05/01/2023 3.  Pt will be able to lie on her left side to sleep for short periods of time Baseline:  Goal status:MET 04/05/2023 4.  Pt will have left shoulder ROM WNL without increased pain with reaching Baseline:  Goal status:ONGOING(nearly achieved but not achieved today due to left shoulder pain  5.  Breast complaints survey will be reduced to no greater than 9 Baseline:  Goal status: Ongoing (21 on 05/03/23)  6. Pt will have more lasting improvement in left UQ muscular tightness after DN  Goal Status:New PLAN:  PT FREQUENCY:  1-2x/week  PT DURATION: 6 weeks  PLANNED INTERVENTIONS: Therapeutic exercises, Therapeutic activity, Patient/Family education, Self Care, Joint mobilization, Orthotic/Fit training, Manual lymph drainage, scar mobilization, Vasopneumatic device, Manual therapy, and Re-evaluation, Dry Needling  PLAN FOR NEXT SESSION: Ortho: DN as needed and manual  therapy as needed. Cancer rehab: FlexiTouch demo sent 05/03/23; how was peach foam for bra?, review L. breast MLD,  MFR to cording,STM to left UQ, PROM left shoulder, LTR with arms outstretched,  MLD to left breast and review with pt., update HEP prn.   Reather Laurence, PT 05/24/23 10:20 AM  [facility] [address], [address] [place], [place] [address] Phone # [phone] Fax [phone]

## 2023-05-29 ENCOUNTER — Encounter: Payer: Self-pay | Admitting: Rehabilitative and Restorative Service Providers"

## 2023-05-29 ENCOUNTER — Ambulatory Visit: Payer: BC Managed Care – PPO | Admitting: Rehabilitative and Restorative Service Providers"

## 2023-05-29 DIAGNOSIS — Z483 Aftercare following surgery for neoplasm: Secondary | ICD-10-CM

## 2023-05-29 DIAGNOSIS — M25612 Stiffness of left shoulder, not elsewhere classified: Secondary | ICD-10-CM | POA: Diagnosis not present

## 2023-05-29 DIAGNOSIS — R252 Cramp and spasm: Secondary | ICD-10-CM

## 2023-05-29 DIAGNOSIS — C50412 Malignant neoplasm of upper-outer quadrant of left female breast: Secondary | ICD-10-CM

## 2023-05-29 DIAGNOSIS — R293 Abnormal posture: Secondary | ICD-10-CM

## 2023-05-29 NOTE — Therapy (Signed)
OUTPATIENT PHYSICAL THERAPY  UPPER EXTREMITY ONCOLOGY TREATMENT  Patient Name: Caitlyn Torres MRN: [id number] DOB:May 17, 1961, 62 y.o., female Today's Date: 05/29/2023  END OF SESSION:  PT End of Session - 05/29/23 1158     Visit Number 20    Date for PT Re-Evaluation 06/14/23    Authorization Type [employer]    PT Start Time 1153   Pt arrived late   PT Stop Time 1231    PT Time Calculation (min) 38 min    Activity Tolerance Patient tolerated treatment well    Behavior During Therapy WFL for tasks assessed/performed             Past Medical History:  Diagnosis Date   Anxiety    Arthritis    Cancer (HCC) 03/2022   L breast   Depression    Family history of breast cancer 05/15/2022   Family history of pancreatic cancer 05/15/2022   Family history of prostate cancer 05/15/2022   GERD (gastroesophageal reflux disease)    History of hiatal hernia    patient believes she may have been told she has this years ago   Past Surgical History:  Procedure Laterality Date   BREAST BIOPSY  09/19/2022   MM LT RADIOACTIVE SEED EA ADD LESION LOC MAMMO GUIDE 09/19/2022 [facility]   BREAST BIOPSY  09/19/2022   MM LT RADIOACTIVE SEED LOC MAMMO GUIDE 09/19/2022 [facility]   BREAST LUMPECTOMY WITH RADIOACTIVE SEED AND AXILLARY LYMPH NODE DISSECTION Left 09/20/2022   Procedure: LEFT BREAST SEED LUMPECTOMY, LEFT AXILLARY LYMPH NODE DISSECTION;  Surgeon: Harriette Bouillon, MD;  Location: [facility] OR;  Service: General;  Laterality: Left;  GEN & PEC BLOCK   CERVIX SURGERY     Laser removal of pre-cancer cells   LASIK Bilateral    PORTACATH PLACEMENT Right 04/20/2022   Procedure: PORT PLACEMENT;  Surgeon: Harriette Bouillon, MD;  Location: [facility] OR;  Service: General;  Laterality: Right;   Patient Active Problem List   Diagnosis Date Noted   Other fatigue 08/28/2022   Acute pain 08/28/2022   Genetic testing 06/01/2022   Family history of breast cancer 05/15/2022   Family history of pancreatic  cancer 05/15/2022   Family history of prostate cancer 05/15/2022   Port-A-Cath in place 04/28/2022   Malignant neoplasm of upper-outer quadrant of left breast in female, estrogen receptor positive (HCC) 03/29/2022      REFERRING PROVIDER: [name], MD  REFERRING DIAG: Left breast swelling/tenderness  THERAPY DIAG:  Stiffness of left shoulder, not elsewhere classified  Aftercare following surgery for neoplasm  Abnormal posture  Cramp and spasm  Malignant neoplasm of upper-outer quadrant of left female breast, unspecified estrogen receptor status (HCC)  ONSET DATE: Early March  Rationale for Evaluation and Treatment: Rehabilitation  SUBJECTIVE:  SUBJECTIVE STATEMENT:  Pt states that dry needling seemed to help, but she also reports that she noted that she felt better after she repositioned her compression bra straps.   PERTINENT HISTORY:  03/14/2022:Screening detected left breast masses 1.5 cm and 1.3 cm with enlarged left axillary lymph node, biopsy revealed grade 3 IDC with lymph node being positive, ER 95%, PR 0%, HER2 2+ by IHC and FISH positive with a ratio 2.45 and a copy #4.9, the second biopsy was HER2 negative with a ratio  1. Neoadjuvant chemotherapy with Herceptin Perjeta and letrozole for 8 cycles.  (Our original recommendation is TCHP x6 cycles but patient is not willing to go through chemo) 2. left lumpectomy and ALND 09/20/2022: 0.6 cm IDC 2/14 lymph nodes, margins negative, ER 95%, PR 0%, HER2 positive 3. Followed by adjuvant radiation therapy  4.  Adjuvant antiestrogen therapy with neratinib She had left lumpectomy on 09/20/2022 with 2/14 LN's    PAIN:  Are you having pain? Yes, my neck, back,  NPRS scale:4/10 in cervical, 5/10 in left breast/axilla Pain location: LEFT  chest and axilla Pain orientation: Left  PAIN TYPE: tight and sore Pain description: intermittent Aggravating factors: being hugged, reaching, lying on left side Relieving factors: Compression for cording  PRECAUTIONS: Left UE lymphedema precautions  WEIGHT BEARING RESTRICTIONS: No  FALLS:  Has patient fallen in last 6 months? 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  SENSATION: Light touch: Deficits     POSTURE: forward head, rounded shoulders  UPPER EXTREMITY AROM/PROM:  A/PROM RIGHT   eval   Shoulder extension 42  Shoulder flexion 163  Shoulder abduction 179  Shoulder internal rotation 58  Shoulder external rotation 88    (Blank rows = not tested)  A/PROM LEFT   eval LEFT 03/13/2023 LEFT 04/18/23 LEFT 05/03/23  Shoulder extension 37  50 45  Shoulder flexion 144 159 160 160  Shoulder abduction 140 168 175 167  Shoulder internal rotation 56     Shoulder external rotation 84       (Blank rows = not tested)  CERVICAL AROM: All within normal limits:     UPPER EXTREMITY STRENGTH:   LYMPHEDEMA ASSESSMENTS:   SURGERY TYPE/DATE: Left lumpectomy,09/20/2022  NUMBER OF LYMPH NODES REMOVED: 2+/14  CHEMOTHERAPY: No at her request, Did Herceptin, Perjeta and  letrozole  RADIATION:yes but stopped early at her request  HORMONE TREATMENT: YES  INFECTIONS: NO   LYMPHEDEMA ASSESSMENTS:   LANDMARK RIGHT  eval  At axilla  24.8  15 cm proximal to olecranon process   10 cm proximal to olecranon process 22.8  Olecranon process 21.8  15 cm proximal to ulnar styloid process   10 cm proximal to ulnar styloid process 20.6  Just proximal to ulnar styloid process 14.5  Across hand at thumb web space 19.5  At base of 2nd digit 5.7  (Blank rows =  not tested)  LANDMARK LEFT  eval  At axilla  25.5  15 cm proximal to olecranon process   10 cm proximal to olecranon process 22.7  Olecranon process 21.5  15 cm proximal to ulnar styloid process   10 cm proximal to ulnar styloid process 19.3  Just proximal to ulnar styloid process 14.45  Across hand at thumb web space 18.3  At base of 2nd digit 5.7  (Blank rows = not tested)  Chest 85 cm  L-DEX LYMPHEDEMA SCREENING: The patient was assessed using the L-Dex machine today to produce a lymphedema index baseline score. The patient will be reassessed on a regular basis (typically every 3 months) to obtain new L-Dex scores. If the score is > 6.5 points away from his/her baseline score indicating onset of subclinical lymphedema, it will be recommended to wear a compression garment for 4 weeks, 12 hours per day and then be reassessed. If the score continues to be > 6.5 points from baseline at reassessment, we will initiate lymphedema treatment. Assessing in this manner has a 95% rate of preventing clinically significant lymphedema.   BREAST COMPLAINTS QUESTIONNAIRE(EVAL) Pain:8 Heaviness:8 Swollen feeling:9 Tense Skin:8 Redness:6 Bra Print:9 Size of Pores:5 Hard feeling: 6 Total:   59  /80 A Score over 9 indicates lymphedema issues in the breast  BREAST COMPLAINTS QUESTIONNAIRE(04/05/2023) Pain:5 Heaviness:4 Swollen feeling:8 Tense Skin:5 Redness:2 Bra Print:9 Size of Po9res: Hard feeling7:   Total:   49  /80 A Score over 9 indicates lymphedema issues in the breast   BREAST COMPLAINTS QUESTIONNAIRE (05/03/23) Pain:3 Heaviness:3 Swollen feeling:3 Tense Skin:2 Redness:0 Bra Print:3 Size of Pores:3 Hard feeling: 4 Total:   21  /80 A Score over 9 indicates lymphedema issues in the breast   05/03/23  21  TODAY'S TREATMENT:                                                                                                                                          DATE:  05/29/2023 Pulleys for flexion and abduction x2 min each Standing 4D scapular stabilization with 2# light blue ball 2x20 each LUE Seated 3 way blue pball rollout 5x2 sec each direction Supine shoulder flexion with 2# 2x10 Right sidelying for left shoulder abduction with 2# 2x10 Right sidelying for left shoulder ER with 1# 2x10 Manual:  soft tissue mobilization to left axilla and upper arm region with some gentle soft tissue mobilzation, gentle scar mobilization.  Possible cording noted to right upper arm.   DATE:  05/24/2023 Seated 3 way blue pball rollouts x10 each way with 2 sec hold Supine shoulder flexion with dowel x10 with 2 sec hold Right sidelying for left shoulder abduction with 1# x10 Right sidelying for left shoulder ER with 1# x10 Supine D2 shoulder pattern with 1# x10 bilat Supine serratus punch with 1# 2x10 bilat Supine cervical retraction 2x10 Right sidelying open book x10 Trigger  Point Dry-Needling  Treatment instructions: Expect mild to moderate muscle soreness. S/S of pneumothorax if dry needled over a lung field, and to seek immediate medical attention should they occur. Patient verbalized understanding of these instructions and education. Patient Consent Given: Yes Education handout provided: Previously provided Muscles treated: bilat cervical and thoracic multifidi, bilat upper traps, left rhomboids, left lats Electrical stimulation performed: Yes Parameters: N/A Treatment  response/outcome: Utilized skilled palpation to identify bony landmarks and trigger points.  Able to illicit twitch response and muscle elongation.  Soft tissue mobilization following to further promote tissue elongation.      DATE:  05/14/23 Seated physio ball roll outs x 10 fwd, and to each side holding 2-3 seconds Supine wand exercises x 10 each holding 2-3 sec (flexion, abduction and ER) Standing towel stretch for left shoulder IR x 10 holding 2-3 sec Supine PROM to C spine with manual upper trap and levator stretch STM to lat area and lateral incsion left side in right side lying Trigger Point Dry-Needling  Treatment instructions: Expect mild to moderate muscle soreness. S/S of pneumothorax if dry needled over a lung field, and to seek immediate medical attention should they occur. Patient verbalized understanding of these instructions and education. Patient Consent Given: Yes Education handout provided: Yes Muscles treated: bilateral upper traps, left pec major and minor Electrical stimulation performed: No Parameters: N/A Treatment response/outcome: Skilled palpation used to identify taut bands and trigger points.  Once identified, dry needling techniques used to treat these areas.  Twitch response ellicited along with palpable elongation of muscle.  Following treatment, patient reported decreased muscle tension and pain in the cervical spine and pectoralis area.     Pt permission and consent throughout each step of examination and treatment with modification and draping if requested when working on sensitive areas  05/08/2023  STM bilateral UT, left pectorals/lateral trunk and in SL to left scapular with cupping to left lateral trunk and scapular region Manual traction, suboccipital release MFR to left axilla and forearm area of cording Supine wand flex and scaption x 5 PROM left shoulder flex, scaption, abd, ER   PATIENT EDUCATION: 02/20/2022 Education details: Discussed POC, Left  UQ tightness and limitations in ROM, breast swelling, compression bra, MLD Person educated: Patient Education method: Explanation Education comprehension: verbalized understanding  HOME EXERCISE PROGRAM: None given; will resume 4 post of exercises and hold on band exercises right now.  ASSESSMENT:  CLINICAL IMPRESSION  Beth arrived today with reports of increased left breast pain compared to cervical region.  Patient points to axilla and upper arm area with reporting tightness at incision area.  Patient with possible cording of left upper arm with patient reporting decreased pain following manual therapy to area.  Patient recommended to follow back up with cancer/lymphedema PT side to follow up with manual therapy needs.  Pt ended session reporting less pain and tightness than she had at beginning of the session.  Pt can return to ortho side if she has any further dry needling needs in her treatment plan.  OBJECTIVE IMPAIRMENTS: decreased activity tolerance, decreased knowledge of condition, decreased ROM, increased edema, increased fascial restrictions, impaired UE functional use, postural dysfunction, and pain.   ACTIVITY LIMITATIONS: sleeping and reach over head  PARTICIPATION LIMITATIONS: interpersonal relationship  PERSONAL FACTORS: 3+ comorbidities: Left breast cancer s/p infusions and radiation  are also affecting patient's functional outcome.   REHAB POTENTIAL: Excellent  CLINICAL DECISION MAKING: Stable/uncomplicated  EVALUATION COMPLEXITY: Low  GOALS: Goals reviewed with patient? Yes  SHORT TERM GOALS= LONG TERM GOALS: Target date: 04/04/2023  Pt will be independent in MLD for reducing left breast edema Baseline: Goal status: MET 04/05/2023  2.  Pt will note decreased breast swelling/tenderness by atleast 50% Baseline:  Goal status:MET 05/01/2023 3.  Pt will be able to lie on her left side to sleep for short periods of time Baseline:  Goal status:MET 04/05/2023 4.  Pt  will have left shoulder ROM WNL without increased pain with reaching Baseline:  Goal status:ONGOING(nearly achieved but not achieved today due to left shoulder pain  5.  Breast complaints survey will be reduced to no greater than 9 Baseline:  Goal status: Ongoing (21 on 05/03/23)  6. Pt will have more lasting improvement in left UQ muscular tightness after DN  Goal Status:New PLAN:  PT FREQUENCY: 1-2x/week  PT DURATION: 6 weeks  PLANNED INTERVENTIONS: Therapeutic exercises, Therapeutic activity, Patient/Family education, Self Care, Joint mobilization, Orthotic/Fit training, Manual lymph drainage, scar mobilization, Vasopneumatic device, Manual therapy, and Re-evaluation, Dry Needling  PLAN FOR NEXT SESSION: Ortho: DN as needed and manual therapy as needed. Cancer rehab: FlexiTouch demo sent 05/03/23; how was peach foam for bra?, review L. breast MLD,  MFR to cording,STM to left UQ, PROM left shoulder, LTR with arms outstretched,  MLD to left breast and review with pt., update HEP prn.   Reather Laurence, PT 05/29/23 1:01 PM  [facility] [address], [address] [place], [place] [address] Phone # [phone] Fax [phone]

## 2023-06-05 ENCOUNTER — Encounter: Payer: BC Managed Care – PPO | Admitting: Rehabilitative and Restorative Service Providers"

## 2023-06-06 ENCOUNTER — Ambulatory Visit: Payer: BC Managed Care – PPO

## 2023-06-06 ENCOUNTER — Ambulatory Visit: Payer: BC Managed Care – PPO | Admitting: Rehabilitative and Restorative Service Providers"

## 2023-06-06 DIAGNOSIS — M6281 Muscle weakness (generalized): Secondary | ICD-10-CM

## 2023-06-06 DIAGNOSIS — C50412 Malignant neoplasm of upper-outer quadrant of left female breast: Secondary | ICD-10-CM

## 2023-06-06 DIAGNOSIS — M25612 Stiffness of left shoulder, not elsewhere classified: Secondary | ICD-10-CM

## 2023-06-06 DIAGNOSIS — Z483 Aftercare following surgery for neoplasm: Secondary | ICD-10-CM

## 2023-06-06 DIAGNOSIS — R293 Abnormal posture: Secondary | ICD-10-CM

## 2023-06-06 DIAGNOSIS — R252 Cramp and spasm: Secondary | ICD-10-CM

## 2023-06-06 DIAGNOSIS — I890 Lymphedema, not elsewhere classified: Secondary | ICD-10-CM

## 2023-06-06 NOTE — Therapy (Signed)
OUTPATIENT PHYSICAL THERAPY  UPPER EXTREMITY ONCOLOGY TREATMENT  Patient Name: Caitlyn Torres MRN: [id number] DOB:1961-07-13, 62 y.o., female Today's Date: 06/06/2023  END OF SESSION:  PT End of Session - 06/06/23 0805     Visit Number 21    Number of Visits 26    Date for PT Re-Evaluation 06/14/23    Authorization Type BC[employer]    PT Start Time 0806    PT Stop Time 0900    PT Time Calculation (min) 54 min    Activity Tolerance Patient tolerated treatment well    Behavior During Therapy [facility] for tasks assessed/performed             Past Medical History:  Diagnosis Date   Anxiety    Arthritis    Cancer (HCC) 03/2022   L breast   Depression    Family history of breast cancer 05/15/2022   Family history of pancreatic cancer 05/15/2022   Family history of prostate cancer 05/15/2022   GERD (gastroesophageal reflux disease)    History of hiatal hernia    patient believes she may have been told she has this years ago   Past Surgical History:  Procedure Laterality Date   BREAST BIOPSY  09/19/2022   MM LT RADIOACTIVE SEED EA ADD LESION LOC MAMMO GUIDE 09/19/2022 [facility]   BREAST BIOPSY  09/19/2022   MM LT RADIOACTIVE SEED LOC MAMMO GUIDE 09/19/2022 [facility]   BREAST LUMPECTOMY WITH RADIOACTIVE SEED AND AXILLARY LYMPH NODE DISSECTION Left 09/20/2022   Procedure: LEFT BREAST SEED LUMPECTOMY, LEFT AXILLARY LYMPH NODE DISSECTION;  Surgeon: Harriette Bouillon, MD;  Location: [facility] OR;  Service: General;  Laterality: Left;  GEN & PEC BLOCK   CERVIX SURGERY     Laser removal of pre-cancer cells   LASIK Bilateral    PORTACATH PLACEMENT Right 04/20/2022   Procedure: PORT PLACEMENT;  Surgeon: Harriette Bouillon, MD;  Location: [facility] OR;  Service: General;  Laterality: Right;   Patient Active Problem List   Diagnosis Date Noted   Other fatigue 08/28/2022   Acute pain 08/28/2022   Genetic testing 06/01/2022   Family history of breast cancer 05/15/2022   Family history of  pancreatic cancer 05/15/2022   Family history of prostate cancer 05/15/2022   Port-A-Cath in place 04/28/2022   Malignant neoplasm of upper-outer quadrant of left breast in female, estrogen receptor positive (HCC) 03/29/2022      REFERRING PROVIDER: [name], MD  REFERRING DIAG: Left breast swelling/tenderness  THERAPY DIAG:  Stiffness of left shoulder, not elsewhere classified  Aftercare following surgery for neoplasm  Abnormal posture  Cramp and spasm  Malignant neoplasm of upper-outer quadrant of left female breast, unspecified estrogen receptor status (HCC)  Lymphedema of breast  Muscle weakness (generalized)  ONSET DATE: Early March  Rationale for Evaluation and Treatment: Rehabilitation  SUBJECTIVE:  SUBJECTIVE STATEMENT:  I think the compression bra was aggravating my neck so I loosened the straps some. I also got a different pillow, The DN did seem to help especially my neck. My left pecs are still sore but overall better.  I have been doing the Flexi touch. Breast swelling is better. I do feel the cording.   PERTINENT HISTORY:  03/14/2022:Screening detected left breast masses 1.5 cm and 1.3 cm with enlarged left axillary lymph node, biopsy revealed grade 3 IDC with lymph node being positive, ER 95%, PR 0%, HER2 2+ by IHC and FISH positive with a ratio 2.45 and a copy #4.9, the second biopsy was HER2 negative with a ratio  1. Neoadjuvant chemotherapy with Herceptin Perjeta and letrozole for 8 cycles.  (Our original recommendation is TCHP x6 cycles but patient is not willing to go through chemo) 2. left lumpectomy and ALND 09/20/2022: 0.6 cm IDC 2/14 lymph nodes, margins negative, ER 95%, PR 0%, HER2 positive 3. Followed by adjuvant radiation therapy  4.  Adjuvant antiestrogen  therapy with neratinib She had left lumpectomy on 09/20/2022 with 2/14 LN's    PAIN:  Are you having pain? Yes, my neck, back,  NPRS scale:3/10 in neck, left breast/axilla Pain location: LEFT chest and axilla Pain orientation: Left  PAIN TYPE: tight and sore Pain description: intermittent Aggravating factors: being hugged, reaching, lying on left side Relieving factors: Compression for cording  PRECAUTIONS: Left UE lymphedema precautions  WEIGHT BEARING RESTRICTIONS: No  FALLS:  Has patient fallen in last 6 months? 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  SENSATION: Light touch: Deficits     POSTURE: forward head, rounded shoulders  UPPER EXTREMITY AROM/PROM:  A/PROM RIGHT   eval   Shoulder extension 42  Shoulder flexion 163  Shoulder abduction 179  Shoulder internal rotation 58  Shoulder external rotation 88    (Blank rows = not tested)  A/PROM LEFT   eval LEFT 03/13/2023 LEFT 04/18/23 LEFT 05/03/23  Shoulder extension 37  50 45  Shoulder flexion 144 159 160 160  Shoulder abduction 140 168 175 167  Shoulder internal rotation 56     Shoulder external rotation 84       (Blank rows = not tested)  CERVICAL AROM: All within normal limits:     UPPER EXTREMITY  STRENGTH:   LYMPHEDEMA ASSESSMENTS:   SURGERY TYPE/DATE: Left lumpectomy,09/20/2022  NUMBER OF LYMPH NODES REMOVED: 2+/14  CHEMOTHERAPY: No at her request, Did Herceptin, Perjeta and letrozole  RADIATION:yes but stopped early at her request  HORMONE TREATMENT: YES  INFECTIONS: NO   LYMPHEDEMA ASSESSMENTS:   LANDMARK RIGHT  eval  At axilla  24.8  15 cm proximal to olecranon process   10 cm proximal to olecranon process 22.8  Olecranon process 21.8  15 cm proximal to ulnar styloid process   10 cm proximal to ulnar styloid process  20.6  Just proximal to ulnar styloid process 14.5  Across hand at thumb web space 19.5  At base of 2nd digit 5.7  (Blank rows = not tested)  LANDMARK LEFT  eval  At axilla  25.5  15 cm proximal to olecranon process   10 cm proximal to olecranon process 22.7  Olecranon process 21.5  15 cm proximal to ulnar styloid process   10 cm proximal to ulnar styloid process 19.3  Just proximal to ulnar styloid process 14.45  Across hand at thumb web space 18.3  At base of 2nd digit 5.7  (Blank rows = not tested)  Chest 85 cm  L-DEX LYMPHEDEMA SCREENING: The patient was assessed using the L-Dex machine today to produce a lymphedema index baseline score. The patient will be reassessed on a regular basis (typically every 3 months) to obtain new L-Dex scores. If the score is > 6.5 points away from his/her baseline score indicating onset of subclinical lymphedema, it will be recommended to wear a compression garment for 4 weeks, 12 hours per day and then be reassessed. If the score continues to be > 6.5 points from baseline at reassessment, we will initiate lymphedema treatment. Assessing in this manner has a 95% rate of preventing clinically significant lymphedema.   BREAST COMPLAINTS QUESTIONNAIRE(EVAL) Pain:8 Heaviness:8 Swollen feeling:9 Tense Skin:8 Redness:6 Bra Print:9 Size of Pores:5 Hard feeling: 6 Total:   59  /80 A Score over 9 indicates  lymphedema issues in the breast  BREAST COMPLAINTS QUESTIONNAIRE(04/05/2023) Pain:5 Heaviness:4 Swollen feeling:8 Tense Skin:5 Redness:2 Bra Print:9 Size of Po9res: Hard feeling7:  Total:   49  /80 A Score over 9 indicates lymphedema issues in the breast   BREAST COMPLAINTS QUESTIONNAIRE (05/03/23) Pain:3 Heaviness:3 Swollen feeling:3 Tense Skin:2 Redness:0 Bra Print:3 Size of Pores:3 Hard feeling: 4 Total:   21  /80 A Score over 9 indicates lymphedema issues in the breast   05/03/23  21  TODAY'S TREATMENT:                                                                                                                                          DATE:   06/06/2023  Pulleys x 2 min flexion and abduction to warm up STM bilateral UT, left pectorals/lateral trunk and in SL to left scapular with cupping to left lateral trunk and scapular region Manual traction, suboccipital release MFR to left axilla , upper arm and forearm area of cording Half foam roll; bilateral flexion, scaption, horizontal abd x 5, horizontal abd with yellow band x 10, ER with yellow x 10    05/29/2023 Pulleys for flexion and abduction x2 min each Standing 4D scapular stabilization with 2# light blue ball 2x20 each LUE Seated 3 way blue pball rollout 5x2 sec each direction Supine shoulder flexion with 2# 2x10 Right sidelying for left shoulder abduction with 2# 2x10 Right sidelying for left shoulder ER  with 1# 2x10 Manual:  soft tissue mobilization to left axilla and upper arm region with some gentle soft tissue mobilzation, gentle scar mobilization.  Possible cording noted to right upper arm.   DATE:  05/24/2023 Seated 3 way blue pball rollouts x10 each way with 2 sec hold Supine shoulder flexion with dowel x10 with 2 sec hold Right sidelying for left shoulder abduction with 1# x10 Right sidelying for left shoulder ER with 1# x10 Supine D2 shoulder pattern with 1# x10 bilat Supine serratus punch with  1# 2x10 bilat Supine cervical retraction 2x10 Right sidelying open book x10 Trigger Point Dry-Needling  Treatment instructions: Expect mild to moderate muscle soreness. S/S of pneumothorax if dry needled over a lung field, and to seek immediate medical attention should they occur. Patient verbalized understanding of these instructions and education. Patient Consent Given: Yes Education handout provided: Previously provided Muscles treated: bilat cervical and thoracic multifidi, bilat upper traps, left rhomboids, left lats Electrical stimulation performed: Yes Parameters: N/A Treatment response/outcome: Utilized skilled palpation to identify bony landmarks and trigger points.  Able to illicit twitch response and muscle elongation.  Soft tissue mobilization following to further promote tissue elongation.      DATE:  05/14/23 Seated physio ball roll outs x 10 fwd, and to each side holding 2-3 seconds Supine wand exercises x 10 each holding 2-3 sec (flexion, abduction and ER) Standing towel stretch for left shoulder IR x 10 holding 2-3 sec Supine PROM to C spine with manual upper trap and levator stretch STM to lat area and lateral incsion left side in right side lying Trigger Point Dry-Needling  Treatment instructions: Expect mild to moderate muscle soreness. S/S of pneumothorax if dry needled over a lung field, and to seek immediate medical attention should they occur. Patient verbalized understanding of these instructions and education. Patient Consent Given: Yes Education handout provided: Yes Muscles treated: bilateral upper traps, left pec major and minor Electrical stimulation performed: No Parameters: N/A Treatment response/outcome: Skilled palpation used to identify taut bands and trigger points.  Once identified, dry needling techniques used to treat these areas.  Twitch response ellicited along with palpable elongation of muscle.  Following treatment, patient reported decreased muscle  tension and pain in the cervical spine and pectoralis area.     Pt permission and consent throughout each step of examination and treatment with modification and draping if requested when working on sensitive areas  05/08/2023  STM bilateral UT, left pectorals/lateral trunk and in SL to left scapular with cupping to left lateral trunk and scapular region Manual traction, suboccipital release MFR to left axilla and forearm area of cording Supine wand flex and scaption x 5 PROM left shoulder flex, scaption, abd, ER   PATIENT EDUCATION: 02/20/2022 Education details: Discussed POC, Left UQ tightness and limitations in ROM, breast swelling, compression bra, MLD Person educated: Patient Education method: Explanation Education comprehension: verbalized understanding  HOME EXERCISE PROGRAM: None given; will resume 4 post of exercises and hold on band exercises right now.  ASSESSMENT:  CLINICAL IMPRESSION  Pt is improved from DN but still having pain in neck and lateral chest. Cording still present and thick/tender in axilla but improved from last week, and not interfering as much with ROM. Lats still tight as well as pectorals. Pt is on vacation next week. Advised to make a follow up apt when she gets back and will regroup and determine if pt ready for release, or recert.  OBJECTIVE IMPAIRMENTS: decreased activity tolerance, decreased knowledge of  condition, decreased ROM, increased edema, increased fascial restrictions, impaired UE functional use, postural dysfunction, and pain.   ACTIVITY LIMITATIONS: sleeping and reach over head  PARTICIPATION LIMITATIONS: interpersonal relationship  PERSONAL FACTORS: 3+ comorbidities: Left breast cancer s/p infusions and radiation  are also affecting patient's functional outcome.   REHAB POTENTIAL: Excellent  CLINICAL DECISION MAKING: Stable/uncomplicated  EVALUATION COMPLEXITY: Low  GOALS: Goals reviewed with patient? Yes  SHORT TERM GOALS=  LONG TERM GOALS: Target date: 04/04/2023  Pt will be independent in MLD for reducing left breast edema Baseline: Goal status: MET 04/05/2023  2.  Pt will note decreased breast swelling/tenderness by atleast 50% Baseline:  Goal status:MET 05/01/2023 3.  Pt will be able to lie on her left side to sleep for short periods of time Baseline:  Goal status:MET 04/05/2023 4.  Pt will have left shoulder ROM WNL without increased pain with reaching Baseline:  Goal status:ONGOING(nearly achieved but not achieved today due to left shoulder pain  5.  Breast complaints survey will be reduced to no greater than 9 Baseline:  Goal status: Ongoing (21 on 05/03/23)  6. Pt will have more lasting improvement in left UQ muscular tightness after DN  Goal Status:New PLAN:  PT FREQUENCY: 1-2x/week  PT DURATION: 6 weeks  PLANNED INTERVENTIONS: Therapeutic exercises, Therapeutic activity, Patient/Family education, Self Care, Joint mobilization, Orthotic/Fit training, Manual lymph drainage, scar mobilization, Vasopneumatic device, Manual therapy, and Re-evaluation, Dry Needling  PLAN FOR NEXT SESSION: Ortho: DN as needed and manual therapy as needed. Using Flexi touch at home.DC/Recert?  , review L. breast MLD,  MFR to cording,STM /cuppingto left UQ, PROM left shoulder, LTR with arms outstretched, strengthening MLD to left breast and review with pt., update HEP prn.   Alvira Monday, PT 06/06/23 9:13 AM  [facility] [address], [address] [place], [place] [address] Phone # [phone] Fax [phone]

## 2023-06-12 ENCOUNTER — Encounter: Payer: BC Managed Care – PPO | Admitting: Rehabilitative and Restorative Service Providers"

## 2023-06-18 ENCOUNTER — Telehealth: Payer: BC Managed Care – PPO | Admitting: Nurse Practitioner

## 2023-06-18 DIAGNOSIS — B9689 Other specified bacterial agents as the cause of diseases classified elsewhere: Secondary | ICD-10-CM | POA: Diagnosis not present

## 2023-06-18 DIAGNOSIS — J028 Acute pharyngitis due to other specified organisms: Secondary | ICD-10-CM | POA: Diagnosis not present

## 2023-06-18 DIAGNOSIS — R0982 Postnasal drip: Secondary | ICD-10-CM

## 2023-06-18 MED ORDER — AMOXICILLIN 500 MG PO CAPS
500.0000 mg | ORAL_CAPSULE | Freq: Two times a day (BID) | ORAL | 0 refills | Status: AC
Start: 2023-06-18 — End: 2023-06-28

## 2023-06-18 MED ORDER — FLUTICASONE PROPIONATE 50 MCG/ACT NA SUSP: 2.0000 | Freq: Every day | NASAL | 6 refills | Status: DC

## 2023-06-18 MED ORDER — LIDOCAINE VISCOUS HCL 2 % MT SOLN
15.0000 mL | OROMUCOSAL | 0 refills | Status: DC | PRN
Start: 2023-06-18 — End: 2024-01-22

## 2023-06-18 NOTE — Progress Notes (Signed)
Virtual Visit Consent   Caitlyn Torres, you are scheduled for a virtual visit with a [facility] provider today. Just as with appointments in the office, your consent must be obtained to participate. Your consent will be active for this visit and any virtual visit you may have with one of our providers in the next 365 days. If you have a MyChart account, a copy of this consent can be sent to you electronically.  As this is a virtual visit, video technology does not allow for your provider to perform a traditional examination. This may limit your provider's ability to fully assess your condition. If your provider identifies any concerns that need to be evaluated in person or the need to arrange testing (such as labs, EKG, etc.), we will make arrangements to do so. Although advances in technology are sophisticated, we cannot ensure that it will always work on either your end or our end. If the connection with a video visit is poor, the visit may have to be switched to a telephone visit. With either a video or telephone visit, we are not always able to ensure that we have a secure connection.  By engaging in this virtual visit, you consent to the provision of healthcare and authorize for your insurance to be billed (if applicable) for the services provided during this visit. Depending on your insurance coverage, you may receive a charge related to this service.  I need to obtain your verbal consent now. Are you willing to proceed with your visit today? Caitlyn Torres has provided verbal consent on 06/18/2023 for a virtual visit (video or telephone). Viviano Simas, FNP  Date: 06/18/2023 1:00 PM  Virtual Visit via Video Note   I, Viviano Simas, connected with  Caitlyn Torres  ([id number], May 20, 1961) on 06/18/23 at  1:00 PM EDT by a video-enabled telemedicine application and verified that I am speaking with the correct person using two identifiers.  Location: Patient: Virtual Visit Location  Patient: Home Provider: Virtual Visit Location Provider: Home Office   I discussed the limitations of evaluation and management by telemedicine and the availability of in person appointments. The patient expressed understanding and agreed to proceed.    History of Present Illness: Caitlyn Torres is a 62 y.o. who identifies as a female who was assigned female at birth, and is being seen today for a sore throat.  She feels the ST may be due to post nasal drainage  This has been present for the past week and has not improved, if anything she feels worse today   Denies a fever   Noted bumps on posterior tongue  Feels her throat is slightly red   Denies a runny nose  Denies a cough  Throat hurts when she swallows  Worse at night and in the morning   She denies headaches or sinus pressure   Denies any known contact with strep    Denies a rash   She denies any reflux or heartburn   Take zyrtec as needed   Similar occurrence in January of this year  Problems:  Patient Active Problem List   Diagnosis Date Noted   Other fatigue 08/28/2022   Acute pain 08/28/2022   Genetic testing 06/01/2022   Family history of breast cancer 05/15/2022   Family history of pancreatic cancer 05/15/2022   Family history of prostate cancer 05/15/2022   Port-A-Cath in place 04/28/2022   Malignant neoplasm of upper-outer quadrant of left breast in female, estrogen receptor positive (  HCC) 03/29/2022    Allergies:  Allergies  Allergen Reactions   Aleve [Naproxen Sodium] Hives and Swelling    Swelling of the face   Aspirin Hives and Swelling   Macrobid [Nitrofurantoin]     High Fever   Motrin [Ibuprofen] Hives and Swelling    Swelling of the face   Tylenol [Acetaminophen] Hives and Swelling    Swelling of face    Medications:  Current Outpatient Medications:    anastrozole (ARIMIDEX) 1 MG tablet, Take 1 tablet (1 mg total) by mouth daily., Disp: 90 tablet, Rfl: 3   Brimonidine Tartrate  (LUMIFY) 0.025 % SOLN, Place 1 drop into both eyes daily as needed (red eyes)., Disp: , Rfl:    carboxymethylcellulose (REFRESH PLUS) 0.5 % SOLN, Place 1 drop into both eyes 3 (three) times daily as needed (dry eyes)., Disp: , Rfl:    cetirizine (ZYRTEC) 10 MG tablet, Take 10 mg by mouth daily., Disp: , Rfl:    lidocaine (XYLOCAINE) 2 % solution, Swish and swallow 5mL every 4 hours as needed for sore throat, Disp: 100 mL, Rfl: 0   Lidocaine-Prilocaine &Lido HCl 2.5-2.5 & 3.88 % KIT, APPLY TO AFFECTED AREA ONCE AS DIRECTED, Disp: , Rfl:    Multiple Vitamins-Iron (CHLORELLA) CAPS, Take 1 capsule by mouth daily., Disp: , Rfl:    ondansetron (ZOFRAN) 8 MG tablet, Take 1 tablet (8 mg total) by mouth every 8 (eight) hours as needed for nausea or vomiting., Disp: 30 tablet, Rfl: 1   OVER THE COUNTER MEDICATION, Take 1 capsule by mouth daily. Amla, Disp: , Rfl:    OVER THE COUNTER MEDICATION, Take 1 capsule by mouth daily. Mushroom Complex, Disp: , Rfl:    OVER THE COUNTER MEDICATION, Place 1 Application vaginally daily as needed (moisture). Femininity, Disp: , Rfl:    QUERCETIN PO, Take 1 capsule by mouth daily., Disp: , Rfl:    TURMERIC CURCUMIN PO, Take 1 capsule by mouth daily., Disp: , Rfl:   Observations/Objective: Patient is well-developed, well-nourished in no acute distress.  Resting comfortably  at home.  Head is normocephalic, atraumatic.  No labored breathing.  Speech is clear and coherent with logical content.  Patient is alert and oriented at baseline.    Assessment and Plan:  1. Bacterial pharyngitis  - amoxicillin (AMOXIL) 500 MG capsule; Take 1 capsule (500 mg total) by mouth 2 (two) times daily for 10 days.  Dispense: 20 capsule; Refill: 0 - lidocaine (XYLOCAINE) 2 % solution; Use as directed 15 mLs in the mouth or throat every 4 (four) hours as needed for mouth pain.  Dispense: 100 mL; Refill: 0  2. Post-nasal drainage  - fluticasone (FLONASE) 50 MCG/ACT nasal spray; Place 2  sprays into both nostrils daily.  Dispense: 16 g; Refill: 6    Follow up with PCP for ongoing symptoms as discussed   Follow Up Instructions: I discussed the assessment and treatment plan with the patient. The patient was provided an opportunity to ask questions and all were answered. The patient agreed with the plan and demonstrated an understanding of the instructions.  A copy of instructions were sent to the patient via MyChart unless otherwise noted below.    The patient was advised to call back or seek an in-person evaluation if the symptoms worsen or if the condition fails to improve as anticipated.  Time:  I spent 12 minutes with the patient via telehealth technology discussing the above problems/concerns.    Viviano Simas, FNP

## 2023-06-22 ENCOUNTER — Ambulatory Visit: Payer: BC Managed Care – PPO | Attending: Hematology and Oncology

## 2023-06-22 DIAGNOSIS — R252 Cramp and spasm: Secondary | ICD-10-CM | POA: Insufficient documentation

## 2023-06-22 DIAGNOSIS — C50412 Malignant neoplasm of upper-outer quadrant of left female breast: Secondary | ICD-10-CM | POA: Diagnosis present

## 2023-06-22 DIAGNOSIS — I890 Lymphedema, not elsewhere classified: Secondary | ICD-10-CM | POA: Insufficient documentation

## 2023-06-22 DIAGNOSIS — R293 Abnormal posture: Secondary | ICD-10-CM | POA: Diagnosis present

## 2023-06-22 DIAGNOSIS — Z483 Aftercare following surgery for neoplasm: Secondary | ICD-10-CM | POA: Insufficient documentation

## 2023-06-22 DIAGNOSIS — M25612 Stiffness of left shoulder, not elsewhere classified: Secondary | ICD-10-CM | POA: Insufficient documentation

## 2023-06-22 NOTE — Therapy (Signed)
OUTPATIENT PHYSICAL THERAPY  UPPER EXTREMITY ONCOLOGY TREATMENT  Patient Name: Caitlyn Torres MRN: [id number] DOB:[birth year], 62 y.o., female Today's Date: 06/22/2023  END OF SESSION:  PT End of Session - 06/22/23 0803     Visit Number 22    Number of Visits 26    Date for PT Re-Evaluation 06/22/23    PT Start Time 0803    PT Stop Time 0855    PT Time Calculation (min) 52 min    Activity Tolerance Patient tolerated treatment well    Behavior During Therapy [facility] for tasks assessed/performed             Past Medical History:  Diagnosis Date   Anxiety    Arthritis    Cancer (HCC) 03/2022   L breast   Depression    Family history of breast cancer 05/15/2022   Family history of pancreatic cancer 05/15/2022   Family history of prostate cancer 05/15/2022   GERD (gastroesophageal reflux disease)    History of hiatal hernia    patient believes she may have been told she has this years ago   Past Surgical History:  Procedure Laterality Date   BREAST BIOPSY  09/19/2022   MM LT RADIOACTIVE SEED EA ADD LESION LOC MAMMO GUIDE 09/19/2022 [facility]   BREAST BIOPSY  09/19/2022   MM LT RADIOACTIVE SEED LOC MAMMO GUIDE 09/19/2022 [facility]   BREAST LUMPECTOMY WITH RADIOACTIVE SEED AND AXILLARY LYMPH NODE DISSECTION Left 09/20/2022   Procedure: LEFT BREAST SEED LUMPECTOMY, LEFT AXILLARY LYMPH NODE DISSECTION;  Surgeon: Harriette Bouillon, MD;  Location: [facility] OR;  Service: General;  Laterality: Left;  GEN & PEC BLOCK   CERVIX SURGERY     Laser removal of pre-cancer cells   LASIK Bilateral    PORTACATH PLACEMENT Right 04/20/2022   Procedure: PORT PLACEMENT;  Surgeon: Harriette Bouillon, MD;  Location: [facility] OR;  Service: General;  Laterality: Right;   Patient Active Problem List   Diagnosis Date Noted   Other fatigue 08/28/2022   Acute pain 08/28/2022   Genetic testing 06/01/2022   Family history of breast cancer 05/15/2022   Family history of pancreatic cancer 05/15/2022    Family history of prostate cancer 05/15/2022   Port-A-Cath in place 04/28/2022   Malignant neoplasm of upper-outer quadrant of left breast in female, estrogen receptor positive (HCC) 03/29/2022      REFERRING PROVIDER: [name], MD  REFERRING DIAG: Left breast swelling/tenderness  THERAPY DIAG:  Stiffness of left shoulder, not elsewhere classified  Aftercare following surgery for neoplasm  Abnormal posture  Cramp and spasm  Malignant neoplasm of upper-outer quadrant of left female breast, unspecified estrogen receptor status (HCC)  Lymphedema of breast  ONSET DATE: Early March  Rationale for Evaluation and Treatment: Rehabilitation  SUBJECTIVE:  SUBJECTIVE STATEMENT:  I feel ready to graduate today. I have been using my flexi touch but I just got back from the beach and didn't take it with me. My neck pain has been better since I loosened my compression bra straps, and I got a new pillow.  PERTINENT HISTORY:  03/14/2022:Screening detected left breast masses 1.5 cm and 1.3 cm with enlarged left axillary lymph node, biopsy revealed grade 3 IDC with lymph node being positive, ER 95%, PR 0%, HER2 2+ by IHC and FISH positive with a ratio 2.45 and a copy #4.9, the second biopsy was HER2 negative with a ratio  1. Neoadjuvant chemotherapy with Herceptin Perjeta and letrozole for 8 cycles.  (Our original recommendation is TCHP x6 cycles but patient is not willing to go through chemo) 2. left lumpectomy and ALND 09/20/2022: 0.6 cm IDC 2/14 lymph nodes, margins negative, ER 95%, PR 0%, HER2 positive 3. Followed by adjuvant radiation therapy  4.  Adjuvant antiestrogen therapy with neratinib She had left lumpectomy on 09/20/2022 with 2/14 LN's    PAIN:  Are you having pain? Yes, my neck, back,  NPRS  scale:3/10 in neck, left breast/axilla Pain location: LEFT chest and axilla Pain orientation: Left  PAIN TYPE: tight and sore Pain description: intermittent Aggravating factors: being hugged, reaching, lying on left side Relieving factors: Compression for cording  PRECAUTIONS: Left UE lymphedema precautions  WEIGHT BEARING RESTRICTIONS: No  FALLS:  Has patient fallen in last 6 months? 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  SENSATION: Light touch: Deficits     POSTURE: forward head, rounded shoulders  UPPER EXTREMITY AROM/PROM:  A/PROM RIGHT   eval   Shoulder extension 42  Shoulder flexion 163  Shoulder abduction 179  Shoulder internal rotation 58  Shoulder external rotation 88    (Blank rows = not tested)  A/PROM LEFT   eval LEFT 03/13/2023 LEFT 04/18/23 LEFT 05/03/23 LEFT 06/22/2023  Shoulder extension 37  50 45 47  Shoulder flexion 144 159 160 160 161  Shoulder abduction 140 168 175 167 180  Shoulder internal rotation 56    68  Shoulder external rotation 84    110    (Blank rows = not tested)  CERVICAL AROM: All within normal limits:     UPPER EXTREMITY STRENGTH:   LYMPHEDEMA ASSESSMENTS:   SURGERY TYPE/DATE: Left lumpectomy,09/20/2022  NUMBER OF LYMPH  NODES REMOVED: 2+/14  CHEMOTHERAPY: No at her request, Did Herceptin, Perjeta and letrozole  RADIATION:yes but stopped early at her request  HORMONE TREATMENT: YES  INFECTIONS: NO   LYMPHEDEMA ASSESSMENTS:   LANDMARK RIGHT  eval  At axilla  24.8  15 cm proximal to olecranon process   10 cm proximal to olecranon process 22.8  Olecranon process 21.8  15 cm proximal to ulnar styloid process   10 cm proximal to ulnar styloid process 20.6  Just proximal  to ulnar styloid process 14.5  Across hand at thumb web space 19.5  At base of 2nd digit 5.7  (Blank rows = not tested)  LANDMARK LEFT  eval  At axilla  25.5  15 cm proximal to olecranon process   10 cm proximal to olecranon process 22.7  Olecranon process 21.5  15 cm proximal to ulnar styloid process   10 cm proximal to ulnar styloid process 19.3  Just proximal to ulnar styloid process 14.45  Across hand at thumb web space 18.3  At base of 2nd digit 5.7  (Blank rows = not tested)  Chest 85 cm  L-DEX LYMPHEDEMA SCREENING: The patient was assessed using the L-Dex machine today to produce a lymphedema index baseline score. The patient will be reassessed on a regular basis (typically every 3 months) to obtain new L-Dex scores. If the score is > 6.5 points away from his/her baseline score indicating onset of subclinical lymphedema, it will be recommended to wear a compression garment for 4 weeks, 12 hours per day and then be reassessed. If the score continues to be > 6.5 points from baseline at reassessment, we will initiate lymphedema treatment. Assessing in this manner has a 95% rate of preventing clinically significant lymphedema.   BREAST COMPLAINTS QUESTIONNAIRE(EVAL) Pain:8 Heaviness:8 Swollen feeling:9 Tense Skin:8 Redness:6 Bra Print:9 Size of Pores:5 Hard feeling: 6 Total:   59  /80 A Score over 9 indicates lymphedema issues in the breast  BREAST COMPLAINTS QUESTIONNAIRE(04/05/2023) Pain:5 Heaviness:4 Swollen  feeling:8 Tense Skin:5 Redness:2 Bra Print:9 Size of Po9res: Hard feeling7:  Total:   49  /80 A Score over 9 indicates lymphedema issues in the breast   BREAST COMPLAINTS QUESTIONNAIRE (05/03/23) Pain:3 Heaviness:3 Swollen feeling:3 Tense Skin:2 Redness:0 Bra Print:3 Size of Pores:3 Hard feeling: 4 Total:   21  /80 A Score over 9 indicates lymphedema issues in the breast 06/21/2023 BREAST COMPLAINTS QUESTIONNAIRE Pain:2 Heaviness:0 Swollen feeling:2 Tense Skin:2 Redness:0 Bra Print:3 Size of Pores:2 Hard feeling: 2 Total:   13  /80 A Score over 9 indicates lymphedema issues in the breast     05/03/23  21  TODAY'S TREATMENT:                                                                                                                                          DATE:   06/22/2023 Supine wand flexion and scaption x 5 STM bilateral UT, left pectorals/lateral trunk and in SL to left scapular area  with cocoa butter Left wall slide with MFR by pt at inferior axillary region x 5 Assessed breast and left UE ROM Gave ABC handout and briefly discussed ways to progress Performed SOZO screen  06/06/2023  Pulleys x 2 min flexion and abduction to warm up STM bilateral UT, left pectorals/lateral trunk and in SL to left scapular with cupping to left lateral trunk and scapular region Manual traction, suboccipital  release MFR to left axilla , upper arm and forearm area of cording Half foam roll; bilateral flexion, scaption, horizontal abd x 5, horizontal abd with yellow band x 10, ER with yellow x 10    05/29/2023 Pulleys for flexion and abduction x2 min each Standing 4D scapular stabilization with 2# light blue ball 2x20 each LUE Seated 3 way blue pball rollout 5x2 sec each direction Supine shoulder flexion with 2# 2x10 Right sidelying for left shoulder abduction with 2# 2x10 Right sidelying for left shoulder ER with 1# 2x10 Manual:  soft tissue mobilization to left axilla  and upper arm region with some gentle soft tissue mobilzation, gentle scar mobilization.  Possible cording noted to right upper arm.   DATE:  05/24/2023 Seated 3 way blue pball rollouts x10 each way with 2 sec hold Supine shoulder flexion with dowel x10 with 2 sec hold Right sidelying for left shoulder abduction with 1# x10 Right sidelying for left shoulder ER with 1# x10 Supine D2 shoulder pattern with 1# x10 bilat Supine serratus punch with 1# 2x10 bilat Supine cervical retraction 2x10 Right sidelying open book x10 Trigger Point Dry-Needling  Treatment instructions: Expect mild to moderate muscle soreness. S/S of pneumothorax if dry needled over a lung field, and to seek immediate medical attention should they occur. Patient verbalized understanding of these instructions and education. Patient Consent Given: Yes Education handout provided: Previously provided Muscles treated: bilat cervical and thoracic multifidi, bilat upper traps, left rhomboids, left lats Electrical stimulation performed: Yes Parameters: N/A Treatment response/outcome: Utilized skilled palpation to identify bony landmarks and trigger points.  Able to illicit twitch response and muscle elongation.  Soft tissue mobilization following to further promote tissue elongation.      DATE:  05/14/23 Seated physio ball roll outs x 10 fwd, and to each side holding 2-3 seconds Supine wand exercises x 10 each holding 2-3 sec (flexion, abduction and ER) Standing towel stretch for left shoulder IR x 10 holding 2-3 sec Supine PROM to C spine with manual upper trap and levator stretch STM to lat area and lateral incsion left side in right side lying Trigger Point Dry-Needling  Treatment instructions: Expect mild to moderate muscle soreness. S/S of pneumothorax if dry needled over a lung field, and to seek immediate medical attention should they occur. Patient verbalized understanding of these instructions and education. Patient  Consent Given: Yes Education handout provided: Yes Muscles treated: bilateral upper traps, left pec major and minor Electrical stimulation performed: No Parameters: N/A Treatment response/outcome: Skilled palpation used to identify taut bands and trigger points.  Once identified, dry needling techniques used to treat these areas.  Twitch response ellicited along with palpable elongation of muscle.  Following treatment, patient reported decreased muscle tension and pain in the cervical spine and pectoralis area.     Pt permission and consent throughout each step of examination and treatment with modification and draping if requested when working on sensitive areas  05/08/2023  STM bilateral UT, left pectorals/lateral trunk and in SL to left scapular with cupping to left lateral trunk and scapular region Manual traction, suboccipital release MFR to left axilla and forearm area of cording Supine wand flex and scaption x 5 PROM left shoulder flex, scaption, abd, ER   PATIENT EDUCATION: 02/20/2022 Education details: Discussed POC, Left UQ tightness and limitations in ROM, breast swelling, compression bra, MLD Person educated: Patient Education method: Explanation Education comprehension: verbalized understanding  HOME EXERCISE PROGRAM: None given; will resume 4 post of exercises and  hold on band exercises right now.  ASSESSMENT:  CLINICAL IMPRESSION Pt has achieved all goals established except breast complaints survey 9 or less. She has improved from 59 to 13 but did not achieve goal of 9. ROM is now WNL without pain today. She is compliant with her HEP and use of her Flexi touch and is independent with Breast MLD.Marland Kitchen She has some mild tightness tenderness in the pectorals and serratus area, but she is greatly improved overall. SOZO screen is WNL. She feels ready to be released to independent self management.   OBJECTIVE IMPAIRMENTS: decreased activity tolerance, decreased knowledge of  condition, decreased ROM, increased edema, increased fascial restrictions, impaired UE functional use, postural dysfunction, and pain.   ACTIVITY LIMITATIONS: sleeping and reach over head  PARTICIPATION LIMITATIONS: interpersonal relationship  PERSONAL FACTORS: 3+ comorbidities: Left breast cancer s/p infusions and radiation  are also affecting patient's functional outcome.   REHAB POTENTIAL: Excellent  CLINICAL DECISION MAKING: Stable/uncomplicated  EVALUATION COMPLEXITY: Low  GOALS: Goals reviewed with patient? Yes  SHORT TERM GOALS= LONG TERM GOALS: Target date: 04/04/2023  Pt will be independent in MLD for reducing left breast edema Baseline: Goal status: MET 04/05/2023  2.  Pt will note decreased breast swelling/tenderness by atleast 50% Baseline:  Goal status:MET 05/01/2023 3.  Pt will be able to lie on her left side to sleep for short periods of time Baseline:  Goal status:MET 04/05/2023 4.  Pt will have left shoulder ROM WNL without increased pain with reaching Baseline:  Goal status:MET 06/22/2023  5.  Breast complaints survey will be reduced to no greater than 9 Baseline:  Goal status: NOT MET  13 06/22/2023 6. Pt will have more lasting improvement in left UQ muscular tightness after DN  Goal Status:MET PLAN:  PT FREQUENCY: 1-2x/week  PT DURATION: 6 weeks  PLANNED INTERVENTIONS: Therapeutic exercises, Therapeutic activity, Patient/Family education, Self Care, Joint mobilization, Orthotic/Fit training, Manual lymph drainage, scar mobilization, Vasopneumatic device, Manual therapy, and Re-evaluation, Dry Needling  PLAN FOR NEXT SESSION:  Pt Discharged to independent self management. PHYSICAL THERAPY DISCHARGE SUMMARY  Visits from Start of Care: 22  Current functional level related to goals / functional outcomes: Achieved all but one goal   Remaining deficits: Breast Complaints survery 13, but reduced from 59. Mildly enlarged pores still present   Education /  Equipment: HEP, theraband   Patient agrees to discharge. Patient goals were partially met. Patient is being discharged due to being pleased with the current functional level.  Alvira Monday, PT 06/22/23 8:59 AM  [facility] [address] [place], [place] [address] Phone # [phone] Fax [phone]

## 2023-07-09 ENCOUNTER — Ambulatory Visit: Payer: BC Managed Care – PPO

## 2023-07-20 ENCOUNTER — Telehealth: Payer: Self-pay

## 2023-07-20 NOTE — Telephone Encounter (Signed)
Called pt per MD to advise [employer] testing was negative/not detected. Pt verbalized understanding of results and knows [facility] will be in touch to schedule 3 mo repeat lab.   

## 2023-07-24 ENCOUNTER — Other Ambulatory Visit: Payer: Self-pay | Admitting: Adult Health

## 2023-07-24 DIAGNOSIS — C50412 Malignant neoplasm of upper-outer quadrant of left female breast: Secondary | ICD-10-CM

## 2023-07-25 ENCOUNTER — Other Ambulatory Visit: Payer: Self-pay

## 2023-07-25 ENCOUNTER — Inpatient Hospital Stay: Payer: BC Managed Care – PPO

## 2023-07-25 ENCOUNTER — Encounter: Payer: Self-pay | Admitting: Adult Health

## 2023-07-25 ENCOUNTER — Inpatient Hospital Stay: Payer: BC Managed Care – PPO | Attending: Hematology and Oncology | Admitting: Adult Health

## 2023-07-25 VITALS — BP 136/71 | HR 91 | Temp 97.7°F | Resp 18 | Wt 117.5 lb

## 2023-07-25 DIAGNOSIS — K219 Gastro-esophageal reflux disease without esophagitis: Secondary | ICD-10-CM | POA: Insufficient documentation

## 2023-07-25 DIAGNOSIS — Z79811 Long term (current) use of aromatase inhibitors: Secondary | ICD-10-CM | POA: Diagnosis not present

## 2023-07-25 DIAGNOSIS — Z8042 Family history of malignant neoplasm of prostate: Secondary | ICD-10-CM | POA: Insufficient documentation

## 2023-07-25 DIAGNOSIS — Z923 Personal history of irradiation: Secondary | ICD-10-CM | POA: Diagnosis not present

## 2023-07-25 DIAGNOSIS — Z800 Family history of malignant neoplasm of digestive organs: Secondary | ICD-10-CM | POA: Diagnosis not present

## 2023-07-25 DIAGNOSIS — Z9221 Personal history of antineoplastic chemotherapy: Secondary | ICD-10-CM | POA: Diagnosis not present

## 2023-07-25 DIAGNOSIS — K449 Diaphragmatic hernia without obstruction or gangrene: Secondary | ICD-10-CM | POA: Insufficient documentation

## 2023-07-25 DIAGNOSIS — Z79899 Other long term (current) drug therapy: Secondary | ICD-10-CM | POA: Diagnosis not present

## 2023-07-25 DIAGNOSIS — C50412 Malignant neoplasm of upper-outer quadrant of left female breast: Secondary | ICD-10-CM

## 2023-07-25 DIAGNOSIS — Z170 Estrogen receptor positive status [ER+]: Secondary | ICD-10-CM | POA: Diagnosis not present

## 2023-07-25 DIAGNOSIS — Z87891 Personal history of nicotine dependence: Secondary | ICD-10-CM | POA: Diagnosis not present

## 2023-07-25 DIAGNOSIS — Z803 Family history of malignant neoplasm of breast: Secondary | ICD-10-CM | POA: Insufficient documentation

## 2023-07-25 DIAGNOSIS — M129 Arthropathy, unspecified: Secondary | ICD-10-CM | POA: Diagnosis not present

## 2023-07-25 LAB — CBC WITH DIFFERENTIAL (CANCER CENTER ONLY)
Abs Immature Granulocytes: 0.01 10*3/uL (ref 0.00–0.07)
Basophils Absolute: 0.1 10*3/uL (ref 0.0–0.1)
Basophils Relative: 1 %
Eosinophils Absolute: 0.1 10*3/uL (ref 0.0–0.5)
Eosinophils Relative: 2 %
HCT: 41.9 % (ref 36.0–46.0)
Hemoglobin: 14 g/dL (ref 12.0–15.0)
Immature Granulocytes: 0 %
Lymphocytes Relative: 27 %
Lymphs Abs: 1 10*3/uL (ref 0.7–4.0)
MCH: 28.2 pg (ref 26.0–34.0)
MCHC: 33.4 g/dL (ref 30.0–36.0)
MCV: 84.3 fL (ref 80.0–100.0)
Monocytes Absolute: 0.3 10*3/uL (ref 0.1–1.0)
Monocytes Relative: 10 %
Neutro Abs: 2.1 10*3/uL (ref 1.7–7.7)
Neutrophils Relative %: 60 %
Platelet Count: 169 10*3/uL (ref 150–400)
RBC: 4.97 MIL/uL (ref 3.87–5.11)
RDW: 16.4 % — ABNORMAL HIGH (ref 11.5–15.5)
WBC Count: 3.5 10*3/uL — ABNORMAL LOW (ref 4.0–10.5)
nRBC: 0 % (ref 0.0–0.2)

## 2023-07-25 LAB — CMP (CANCER CENTER ONLY)
ALT: 20 U/L (ref 0–44)
AST: 26 U/L (ref 15–41)
Albumin: 4.7 g/dL (ref 3.5–5.0)
Alkaline Phosphatase: 57 U/L (ref 38–126)
Anion gap: 6 (ref 5–15)
BUN: 13 mg/dL (ref 6–20)
CO2: 29 mmol/L (ref 22–32)
Calcium: 10.2 mg/dL (ref 8.9–10.3)
Chloride: 105 mmol/L (ref 98–111)
Creatinine: 0.77 mg/dL (ref 0.44–1.00)
GFR, Estimated: >60 mL/min (ref >60)
Glucose, Bld: 79 mg/dL (ref 70–99)
Potassium: 4.2 mmol/L (ref 3.5–5.1)
Sodium: 140 mmol/L (ref 135–145)
Total Bilirubin: 0.5 mg/dL (ref 0.3–1.2)
Total Protein: 7.6 g/dL (ref 6.5–8.1)

## 2023-07-25 NOTE — Progress Notes (Signed)
SURVIVORSHIP VISIT:  BRIEF ONCOLOGIC HISTORY:  Oncology History  Malignant neoplasm of upper-outer quadrant of left breast in female, estrogen receptor positive (HCC)  03/14/2022 Initial Diagnosis   Screening detected left breast masses 1.5 cm and 1.3 cm with enlarged left axillary lymph node, biopsy revealed grade 3 IDC with lymph node being positive, ER 95%, PR 0%, HER2 2+ by IHC and FISH positive with a ratio 2.45 and a copy #4.9, the second biopsy was HER2 negative with a ratio 2.15 and a copy #3.55   03/29/2022 Cancer Staging   Staging form: Breast, AJCC 8th Edition - Clinical: Stage IIA (cT1c, cN1, cM0, G3, ER+, PR-, HER2+) - Signed by [name], MD on 03/29/2022 Stage prefix: Initial diagnosis Histologic grading system: 3 grade system   03/29/2022 - 09/2022 Anti-estrogen oral therapy   Letrozole daily   04/21/2022 -  Chemotherapy   Herceptin/Perjeta every 21 days initially x 3, will reassess with MRI and then will decide whether to continue or to add chemo.  (Original recommendation was TCHP x 6)      Genetic Testing   [facility] CancerNext-Expanded Panel was Negative. Report date is 05/30/2022.  The CancerNext-Expanded gene panel offered by [facility] and includes sequencing, rearrangement, and RNA analysis for the following 77 genes: AIP, ALK, APC, ATM, AXIN2, BAP1, BARD1, BLM, BMPR1A, BRCA1, BRCA2, BRIP1, CDC73, CDH1, CDK4, CDKN1B, CDKN2A, CHEK2, CTNNA1, DICER1, FANCC, FH, FLCN, GALNT12, KIF1B, LZTR1, MAX, MEN1, MET, MLH1, MSH2, MSH3, MSH6, MUTYH, NBN, NF1, NF2, NTHL1, PALB2, PHOX2B, PMS2, POT1, PRKAR1A, PTCH1, PTEN, RAD51C, RAD51D, RB1, RECQL, RET, SDHA, SDHAF2, SDHB, SDHC, SDHD, SMAD4, SMARCA4, SMARCB1, SMARCE1, STK11, SUFU, TMEM127, TP53, TSC1, TSC2, VHL and XRCC2 (sequencing and deletion/duplication); EGFR, EGLN1, HOXB13, KIT, MITF, PDGFRA, POLD1, and POLE (sequencing only); EPCAM and GREM1 (deletion/duplication only).    04/21/2022 - 09/14/2022 Chemotherapy   Given neoadjuvantly  with Letrozole (declined neoadjuvant chemotherapy with TCHP) Patient is on Treatment Plan : BREAST Trastuzumab  + Pertuzumab q21d x 13 cycles      09/20/2022 Surgery   left lumpectomy and ALND: 0.6 cm IDC 2/14 lymph nodes, margins negative, ER 95%, PR 0%, HER2 positive   10/13/2022 -  Chemotherapy   Patient is on Treatment Plan : BREAST ADO-Trastuzumab Emtansine (Kadcyla) q21d     11/07/2022 - 12/06/2022 Radiation Therapy   Site/dose:   The patient had a prescriptive dose of 50.4 Gy in 28 fractions to the breast and SCLV region using a 4-field approach. This was to be delivered using a 3-D conformal technique. The patient did not receive the planned boost over 10 Gy in 5 fractions.  In total, she completed 36 Gy in 20 of the 33 planned fractions.   02/2023 -  Anti-estrogen oral therapy   Anastrozole daily     INTERVAL HISTORY:  Caitlyn Torres to review her survivorship care plan detailing her treatment course for breast cancer, as well as monitoring long-term side effects of that treatment, education regarding health maintenance, screening, and overall wellness and health promotion.     Overall, Caitlyn Torres reports feeling quite well.  She continues on anastrozole with good tolerance.  She denies any significant issues today other than left breast swelling and tenderness.  She has not scheduled a mammogram and does not want to at this point for this reason.  REVIEW OF SYSTEMS:  Review of Systems  Constitutional:  Negative for appetite change, chills, fatigue, fever and unexpected weight change.  HENT:   Negative for hearing loss, lump/mass and trouble swallowing.  Eyes:  Negative for eye problems and icterus.  Respiratory:  Negative for chest tightness, cough and shortness of breath.   Cardiovascular:  Negative for chest pain, leg swelling and palpitations.  Gastrointestinal:  Negative for abdominal distention, abdominal pain, constipation, diarrhea, nausea and vomiting.  Endocrine: Negative  for hot flashes.  Genitourinary:  Negative for difficulty urinating.   Musculoskeletal:  Negative for arthralgias.  Skin:  Negative for itching and rash.  Neurological:  Negative for dizziness, extremity weakness, headaches and numbness.  Hematological:  Negative for adenopathy. Does not bruise/bleed easily.  Psychiatric/Behavioral:  Negative for depression. The patient is not nervous/anxious.    Breast: Denies any new nodularity, masses, tenderness, nipple changes, or nipple discharge.       PAST MEDICAL/SURGICAL HISTORY:  Past Medical History:  Diagnosis Date   Anxiety    Arthritis    Cancer (HCC) 03/2022   L breast   Depression    Family history of breast cancer 05/15/2022   Family history of pancreatic cancer 05/15/2022   Family history of prostate cancer 05/15/2022   GERD (gastroesophageal reflux disease)    History of hiatal hernia    patient believes she may have been told she has this years ago   Past Surgical History:  Procedure Laterality Date   BREAST BIOPSY  09/19/2022   MM LT RADIOACTIVE SEED EA ADD LESION LOC MAMMO GUIDE 09/19/2022 [facility]   BREAST BIOPSY  09/19/2022   MM LT RADIOACTIVE SEED LOC MAMMO GUIDE 09/19/2022 [facility]   BREAST LUMPECTOMY WITH RADIOACTIVE SEED AND AXILLARY LYMPH NODE DISSECTION Left 09/20/2022   Procedure: LEFT BREAST SEED LUMPECTOMY, LEFT AXILLARY LYMPH NODE DISSECTION;  Surgeon: Harriette Bouillon, MD;  Location: [facility];  Service: General;  Laterality: Left;  GEN & PEC BLOCK   CERVIX SURGERY     Laser removal of pre-cancer cells   LASIK Bilateral    PORTACATH PLACEMENT Right 04/20/2022   Procedure: PORT PLACEMENT;  Surgeon: Harriette Bouillon, MD;  Location: [facility] OR;  Service: General;  Laterality: Right;     ALLERGIES:  Allergies  Allergen Reactions   Aleve [Naproxen Sodium] Hives and Swelling    Swelling of the face   Aspirin Hives and Swelling   Macrobid [Nitrofurantoin]     High Fever   Motrin [Ibuprofen] Hives and  Swelling    Swelling of the face   Tylenol [Acetaminophen] Hives and Swelling    Swelling of face      CURRENT MEDICATIONS:  Outpatient Encounter Medications as of 07/25/2023  Medication Sig   anastrozole (ARIMIDEX) 1 MG tablet Take 1 tablet (1 mg total) by mouth daily.   Brimonidine Tartrate (LUMIFY) 0.025 % SOLN Place 1 drop into both eyes daily as needed (red eyes).   carboxymethylcellulose (REFRESH PLUS) 0.5 % SOLN Place 1 drop into both eyes 3 (three) times daily as needed (dry eyes).   cetirizine (ZYRTEC) 10 MG tablet Take 10 mg by mouth daily.   estradiol (ESTRACE) 0.1 MG/GM vaginal cream 1 gram placed in vagina 2-3 times weekly.   Multiple Vitamins-Iron (CHLORELLA) CAPS Take 1 capsule by mouth daily.   OVER THE COUNTER MEDICATION Take 1 capsule by mouth daily. Amla   OVER THE COUNTER MEDICATION Take 1 capsule by mouth daily. Mushroom Complex   OVER THE COUNTER MEDICATION Place 1 Application vaginally daily as needed (moisture). Femininity   QUERCETIN PO Take 1 capsule by mouth daily.   TURMERIC CURCUMIN PO Take 1 capsule by mouth daily.   lidocaine (XYLOCAINE)  2 % solution Use as directed 15 mLs in the mouth or throat every 4 (four) hours as needed for mouth pain. (Patient not taking: Reported on 07/25/2023)   [DISCONTINUED] fluticasone (FLONASE) 50 MCG/ACT nasal spray Place 2 sprays into both nostrils daily.   [DISCONTINUED] Lidocaine-Prilocaine &Lido HCl 2.5-2.5 & 3.88 % KIT APPLY TO AFFECTED AREA ONCE AS DIRECTED   [DISCONTINUED] ondansetron (ZOFRAN) 8 MG tablet Take 1 tablet (8 mg total) by mouth every 8 (eight) hours as needed for nausea or vomiting.   No facility-administered encounter medications on file as of 07/25/2023.     ONCOLOGIC FAMILY HISTORY:  Family History  Problem Relation Age of Onset   Prostate cancer Father        dx after 59   Breast cancer Sister 80       neg GT   Prostate cancer Maternal Grandfather        d. late 8s   Pancreatic cancer Paternal  Grandmother        d. 86s   Parkinson's disease Paternal Grandmother    Bladder Cancer Paternal Grandfather        dx after 41   Breast cancer Cousin        paternal female cousin; dx 76s     SOCIAL HISTORY:  Social History   Socioeconomic History   Marital status: Significant Other    Spouse name: [name]   Number of children: 1   Years of education: Not on file   Highest education level: Not on file  Occupational History   Not on file  Tobacco Use   Smoking status: Former    Current packs/day: 1.00    Average packs/day: 1 pack/day for 20.0 years (20.0 ttl pk-yrs)    Types: Cigarettes   Smokeless tobacco: Never  Vaping Use   Vaping status: Never Used  Substance and Sexual Activity   Alcohol use: Not Currently   Drug use: Never   Sexual activity: Not on file  Other Topics Concern   Not on file  Social History Narrative   Not on file   Social Determinants of Health   Financial Resource Strain: Low Risk  (04/04/2022)   Overall Financial Resource Strain (CARDIA)    Difficulty of Paying Living Expenses: Not very hard  Food Insecurity: No Food Insecurity (04/04/2022)   Hunger Vital Sign    Worried About Running Out of Food in the Last Year: Never true    Ran Out of Food in the Last Year: Never true  Transportation Needs: No Transportation Needs (04/04/2022)   PRAPARE - Administrator, Civil Service (Medical): No    Lack of Transportation (Non-Medical): No  Physical Activity: Not on file  Stress: Not on file  Social Connections: Not on file  Intimate Partner Violence: Not At Risk (04/11/2022)   Humiliation, Afraid, Rape, and Kick questionnaire    Fear of Current or Ex-Partner: No    Emotionally Abused: No    Physically Abused: No    Sexually Abused: No     OBSERVATIONS/OBJECTIVE:  BP 136/71 (BP Location: Right Arm, Patient Position: Sitting)   Pulse 91   Temp 97.7 F (36.5 C) (Temporal)   Resp 18   Wt 117 lb 8 oz (53.3 kg)   LMP 09/25/2019   SpO2  100%   BMI 20.81 kg/m  GENERAL: Patient is a well appearing female in no acute distress HEENT:  Sclerae anicteric.  Oropharynx clear and moist. No ulcerations or evidence of oropharyngeal  candidiasis. Neck is supple.  NODES:  No cervical, supraclavicular, or axillary lymphadenopathy palpated.  BREAST EXAM: Right breast is benign, left breast status postlumpectomy and radiation no sign of local recurrence LUNGS:  Clear to auscultation bilaterally.  No wheezes or rhonchi. HEART:  Regular rate and rhythm. No murmur appreciated. ABDOMEN:  Soft, nontender.  Positive, normoactive bowel sounds. No organomegaly palpated. MSK:  No focal spinal tenderness to palpation. Full range of motion bilaterally in the upper extremities. EXTREMITIES:  No peripheral edema.   SKIN:  Clear with no obvious rashes or skin changes. No nail dyscrasia. NEURO:  Nonfocal. Well oriented.  Appropriate affect.   LABORATORY DATA:  None for this visit.  DIAGNOSTIC IMAGING:  None for this visit.      ASSESSMENT AND PLAN:  Ms.. Torres is a pleasant 62 y.o. female with Stage IIA left breast invasive ductal carcinoma, ER+/PR-/HER2+, diagnosed in 03/2022, treated with neoadjuvant immunotherapy,  lumpectomy, adjuvant Kadcyla, adjuvant radiation therapy, and anti-estrogen therapy with Anastrozole beginning in 02/2023.  She presents to the Survivorship Clinic for our initial meeting and routine follow-up post-completion of treatment for breast cancer.    1. Stage IIA left breast cancer:  Caitlyn Torres is continuing to recover from definitive treatment for breast cancer. She will follow-up with her medical oncologist, Dr.  Pamelia Hoit in 6 months with history and physical exam per surveillance protocol.  She will continue her anti-estrogen therapy with Anastrozole. Thus far, she is tolerating the Anastrozole well, with minimal side effects. Her mammogram is due--she is opted to forego this imaging at this point due to her left breast pain  and tenderness.  She is considering thermography and ultrasound and I reviewed with her that this is not the standard of care which she verbalized understanding of.  She will let me know when she is ready for a bilateral breast diagnostic mammogram and I will place those orders to happen at the breast center.    Today, a comprehensive survivorship care plan and treatment summary was reviewed with the patient today detailing her breast cancer diagnosis, treatment course, potential late/long-term effects of treatment, appropriate follow-up care with recommendations for the future, and patient education resources.  A copy of this summary, along with a letter will be sent to the patient's primary care provider via mail/fax/In Basket message after today's visit.    2. Bone health:  Given Ms. Tozier age/history of breast cancer and her current treatment regimen including anti-estrogen therapy with anastrozole, she is at risk for bone demineralization.  Her last DEXA scan was April 16, 2023 and demonstrated osteopenia with a T-score of -1.5 in the left femur.  Repeat is recommended in June 2026.  She was given education on specific activities to promote bone health.  3. Cancer screening:  Due to Ms. Dave's history and her age, she should receive screening for skin cancers, colon cancer, and gynecologic cancers.  The information and recommendations are listed on the patient's comprehensive care plan/treatment summary and were reviewed in detail with the patient.    4. Health maintenance and wellness promotion: Ms. Arters was encouraged to consume 5-7 servings of fruits and vegetables per day. We reviewed the "Nutrition Rainbow" handout.  She was also encouraged to engage in moderate to vigorous exercise for 30 minutes per day most days of the week.  She was instructed to limit her alcohol consumption and continue to abstain from tobacco use.     5. Support services/counseling: It is not uncommon for this  period of the patient's cancer care trajectory to be one of many emotions and stressors.   She was given information regarding our available services and encouraged to contact me with any questions or for help enrolling in any of our support group/programs.    Follow up instructions:    -Return to cancer center in 6 months for labs and f/u   -Mammogram due  -Bone density due 04/2025 -She is welcome to return back to the Survivorship Clinic at any time; no additional follow-up needed at this time.  -Consider referral back to survivorship as a long-term survivor for continued surveillance  The patient was provided an opportunity to ask questions and all were answered. The patient agreed with the plan and demonstrated an understanding of the instructions.   Total encounter time:45 minutes*in face-to-face visit time, chart review, lab review, care coordination, order entry, and documentation of the encounter time.    Lillard Anes, NP 07/25/23 10:28 AM Medical Oncology and Hematology [facility] [address], [place] [address] Tel. [phone]    Fax. [phone]  *Total Encounter Time as defined by the Centers for Medicare and Medicaid Services includes, in addition to the face-to-face time of a patient visit (documented in the note above) non-face-to-face time: obtaining and reviewing outside history, ordering and reviewing medications, tests or procedures, care coordination (communications with other health care professionals or caregivers) and documentation in the medical record.

## 2023-09-18 ENCOUNTER — Other Ambulatory Visit: Payer: Self-pay | Admitting: *Deleted

## 2023-09-18 DIAGNOSIS — C50412 Malignant neoplasm of upper-outer quadrant of left female breast: Secondary | ICD-10-CM

## 2023-09-18 NOTE — Progress Notes (Signed)
Updated yearly orders for Signatera testing placed.

## 2023-10-08 ENCOUNTER — Ambulatory Visit: Payer: BC Managed Care – PPO | Attending: Hematology and Oncology

## 2023-10-08 ENCOUNTER — Other Ambulatory Visit: Payer: Self-pay | Admitting: *Deleted

## 2023-10-08 VITALS — Wt 119.2 lb

## 2023-10-08 DIAGNOSIS — Z170 Estrogen receptor positive status [ER+]: Secondary | ICD-10-CM

## 2023-10-08 DIAGNOSIS — Z483 Aftercare following surgery for neoplasm: Secondary | ICD-10-CM | POA: Insufficient documentation

## 2023-10-08 NOTE — Therapy (Signed)
OUTPATIENT PHYSICAL THERAPY SOZO SCREENING NOTE   Patient Name: Caitlyn Torres MRN: [id number] DOB:1961/09/13, 62 y.o., female Today's Date: 10/08/2023  PCP: Melida Quitter, PA REFERRING PROVIDER: [name], MD   PT End of Session - 10/08/23 0919     Visit Number 22   # unchanged due to screen only   PT Start Time 0915    PT Stop Time 0921    PT Time Calculation (min) 6 min    Activity Tolerance Patient tolerated treatment well    Behavior During Therapy [facility] for tasks assessed/performed             Past Medical History:  Diagnosis Date   Anxiety    Arthritis    Cancer (HCC) 03/2022   L breast   Depression    Family history of breast cancer 05/15/2022   Family history of pancreatic cancer 05/15/2022   Family history of prostate cancer 05/15/2022   GERD (gastroesophageal reflux disease)    History of hiatal hernia    patient believes she may have been told she has this years ago   Past Surgical History:  Procedure Laterality Date   BREAST BIOPSY  09/19/2022   MM LT RADIOACTIVE SEED EA ADD LESION LOC MAMMO GUIDE 09/19/2022 [facility]   BREAST BIOPSY  09/19/2022   MM LT RADIOACTIVE SEED LOC MAMMO GUIDE 09/19/2022 [facility]   BREAST LUMPECTOMY WITH RADIOACTIVE SEED AND AXILLARY LYMPH NODE DISSECTION Left 09/20/2022   Procedure: LEFT BREAST SEED LUMPECTOMY, LEFT AXILLARY LYMPH NODE DISSECTION;  Surgeon: Harriette Bouillon, MD;  Location: [facility] OR;  Service: General;  Laterality: Left;  GEN & PEC BLOCK   CERVIX SURGERY     Laser removal of pre-cancer cells   LASIK Bilateral    PORTACATH PLACEMENT Right 04/20/2022   Procedure: PORT PLACEMENT;  Surgeon: Harriette Bouillon, MD;  Location: [facility];  Service: General;  Laterality: Right;   Patient Active Problem List   Diagnosis Date Noted   Other fatigue 08/28/2022   Acute pain 08/28/2022   Genetic testing 06/01/2022   Family history of breast cancer 05/15/2022   Family history of pancreatic cancer  05/15/2022   Family history of prostate cancer 05/15/2022   Port-A-Cath in place 04/28/2022   Malignant neoplasm of upper-outer quadrant of left breast in female, estrogen receptor positive (HCC) 03/29/2022    REFERRING DIAG: left breast cancer at risk for lymphedema  THERAPY DIAG: Aftercare following surgery for neoplasm  PERTINENT HISTORY: 03/14/2022:Screening detected left breast masses 1.5 cm and 1.3 cm with enlarged left axillary lymph node, biopsy revealed grade 3 IDC with lymph node being positive, ER 95%, PR 0%, HER2 2+ by IHC and FISH positive with a ratio 2.45 and a copy #4.9, the second biopsy was HER2 negative with a ratio  1. Neoadjuvant chemotherapy with Herceptin Perjeta and letrozole for 8 cycles.  (Our original recommendation is TCHP x6 cycles but patient is not willing to go through chemo) 2. left lumpectomy and ALND 09/20/2022: 0.6 cm IDC 2/14 lymph nodes, margins negative, ER 95%, PR 0%, HER2 positive 3. Followed by adjuvant radiation therapy  4.  Adjuvant antiestrogen therapy with neratinib She had left lumpectomy on 09/20/2022 with 2/14 LN's  PRECAUTIONS: left UE Lymphedema risk, None  SUBJECTIVE: Pt returns for her 3 month L-Dex screen. 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 My Lt arm has been feeling achy from that."   PAIN:  Are you having  pain? No  SOZO SCREENING: Patient was assessed today using the SOZO machine to determine the lymphedema index score. This was compared to her baseline score. It was determined that she is within the recommended range when compared to her baseline and no further action is needed at this time. She will continue SOZO screenings. These are done every 3 months for 2 years post operatively followed by every 6 months for 2 years, and then annually.  Patient reported a change in status to PTA which initiated the PTA consulting with a PT. PT determined it would be appropriate to initiate  therapy at this time. PT requested a referral from patient's provider.    L-DEX FLOWSHEETS - 10/08/23 0900       L-DEX LYMPHEDEMA SCREENING   Measurement Type Unilateral    L-DEX MEASUREMENT EXTREMITY Upper Extremity    POSITION  Standing    DOMINANT SIDE Right    At Risk Side Left    BASELINE SCORE (UNILATERAL) -2.1    L-DEX SCORE (UNILATERAL) -6    VALUE CHANGE (UNILAT) -3.9              Hermenia Bers, PTA 10/08/2023, 9:20 AM

## 2023-10-21 NOTE — Therapy (Signed)
OUTPATIENT PHYSICAL THERAPY  UPPER EXTREMITY ONCOLOGY EVALUATION  Patient Name: Caitlyn Torres MRN: [id number] DOB:February 03, 1961, 62 y.o., female Today's Date: 10/22/2023  END OF SESSION:  PT End of Session - 10/22/23 0807     Visit Number 1    Number of Visits 12    Date for PT Re-Evaluation 12/03/23    PT Start Time 0807    PT Stop Time 0852    PT Time Calculation (min) 45 min    Activity Tolerance Patient tolerated treatment well    Behavior During Therapy [facility] for tasks assessed/performed             Past Medical History:  Diagnosis Date   Anxiety    Arthritis    Cancer (HCC) 03/2022   L breast   Depression    Family history of breast cancer 05/15/2022   Family history of pancreatic cancer 05/15/2022   Family history of prostate cancer 05/15/2022   GERD (gastroesophageal reflux disease)    History of hiatal hernia    patient believes she may have been told she has this years ago   Past Surgical History:  Procedure Laterality Date   BREAST BIOPSY  09/19/2022   MM LT RADIOACTIVE SEED EA ADD LESION LOC MAMMO GUIDE 09/19/2022 [facility]AP[facility]   BREAST BIOPSY  09/19/2022   MM LT RADIOACTIVE SEED LOC MAMMO GUIDE 09/19/2022 [facility]   BREAST LUMPECTOMY WITH RADIOACTIVE SEED AND AXILLARY LYMPH NODE DISSECTION Left 09/20/2022   Procedure: LEFT BREAST SEED LUMPECTOMY, LEFT AXILLARY LYMPH NODE DISSECTION;  Surgeon: Caitlyn Bouillon, MD;  Location: [facility] OR;  Service: General;  Laterality: Left;  GEN & PEC BLOCK   CERVIX SURGERY     Laser removal of pre-cancer cells   LASIK Bilateral    PORTACATH PLACEMENT Right 04/20/2022   Procedure: PORT PLACEMENT;  Surgeon: Caitlyn Bouillon, MD;  Location: [facility] OR;  Service: General;  Laterality: Right;   Patient Active Problem List   Diagnosis Date Noted   Other fatigue 08/28/2022   Acute pain 08/28/2022   Genetic testing 06/01/2022   Family history of breast cancer 05/15/2022   Family history of pancreatic cancer 05/15/2022    Family history of prostate cancer 05/15/2022   Port-A-Cath in place 04/28/2022   Malignant neoplasm of upper-outer quadrant of left breast in female, estrogen receptor positive (HCC) 03/29/2022    PCP:   REFERRING PROVIDER: [name], MD   REFERRING DIAG: Left Breast lymphedema/cording  THERAPY DIAG:  Malignant neoplasm of upper-outer quadrant of left female breast, unspecified estrogen receptor status (HCC)  Aftercare following surgery for neoplasm  Lymphedema of breast  ONSET DATE: March but exacerbated end of November  Rationale for Evaluation and Treatment: Rehabilitation  SUBJECTIVE:  SUBJECTIVE STATEMENT:   I was using the Flexitouch 2 times per week and over time I noticed my breast swelling got worse. I have started doing some manual  lymphatic drainage as well and have been using the machine 4-5 times per week. It has gotten better. I have some cording issues too with some tightness. My breast has been sore at the top of my chest, and medial inferior side, and lateral side.  I have still been wearing the pad in my bra. My breast feels heavy and when I take my bra off it is painful.  I got some new compression bras before Thanksgiving, and one of them is the Wear Ease. I sleep in it every night. I got some cami's and sport bra's  PERTINENT HISTORY:  03/14/2022:Screening detected left breast masses 1.5 cm and 1.3 cm with enlarged left axillary lymph node, biopsy revealed grade 3 IDC with lymph node being positive, ER 95%, PR 0%, HER2 2+ by IHC and FISH positive with a ratio 2.45 and a copy #4.9, the second biopsy was HER2 negative   1. Neoadjuvant chemotherapy with Herceptin Perjeta and letrozole for 8 cycles.  (Our original recommendation is TCHP x6 cycles but patient is not willing to go  through chemo) 2. left lumpectomy and ALND 09/20/2022: 0.6 cm IDC 2/14 lymph nodes, margins negative, ER 95%, PR 0%, HER2 positive 3. Followed by adjuvant radiation therapy  4.  Adjuvant antiestrogen therapy with neratinib She had left lumpectomy on 09/20/2022 with 2/14 LN's  PAIN:  Are you having pain? Yes NPRS scale: 0-5/10, pain mainly with pressure Pain location: left breast Pain orientation: Left and Upper  PAIN TYPE: aching, tight, and sore, heavy Pain description: constant  Aggravating factors: removing bra, movement, walking, jumping,sleeping on left Relieving factors: compression, MLD  PRECAUTIONS: Left UE lymphedema precautions   RED FLAGS: None   WEIGHT BEARING RESTRICTIONS: No  FALLS:  Has patient fallen in last 6 months? 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  COGNITION: Overall cognitive status: Within functional limits for tasks assessed   PALPATION: Tender medial, lateral and proximal left breast, left UT, lats, pectorals  OBSERVATIONS / OTHER ASSESSMENTS:generalized left breast swelling, mild fibrosis near lateral breast incision with tenderness, very few enlarged pores, cording noted left axillary region running below axillary incision to elbow region  SENSATION: Light touch: Deficits    POSTURE:  forward head, rounded shoulders   UPPER EXTREMITY AROM/PROM:  A/PROM RIGHT   eval   Shoulder extension 57  Shoulder flexion 165  Shoulder abduction 180  Shoulder internal rotation 65  Shoulder external rotation 97    (Blank rows = not tested)  A/PROM LEFT   eval  Shoulder extension 50  Shoulder flexion 149   Shoulder abduction 154  Shoulder internal rotation 48  +  Shoulder external rotation 91    (Blank rows = not tested)  CERVICAL AROM: All within normal limits:   UPPER EXTREMITY STRENGTH:   LYMPHEDEMA ASSESSMENTS:   SURGERY TYPE/DATE:  Left lumpectomy,09/20/2022   NUMBER OF LYMPH NODES REMOVED: 2+/14  CHEMOTHERAPY: No at her request, Did Herceptin, Perjeta and letrozole   RADIATION:Yes, but stopped early at her request  HORMONE TREATMENT:  YES  INFECTIONS: NO   LYMPHEDEMA ASSESSMENTS:   LANDMARK RIGHT  eval  At axilla  25.2  15 cm proximal to olecranon process   10 cm proximal to olecranon process 22.7  Olecranon process 21.6  15 cm proximal to ulnar styloid process   10 cm proximal to ulnar styloid process 20.3  Just proximal to ulnar styloid process 14.2  Across hand at thumb web space 19.3  At base of 2nd digit 5.7  (Blank rows = not tested)  LANDMARK LEFT  eval  At axilla  25.2  15 cm proximal to olecranon process   10 cm proximal to olecranon process 22.2  Olecranon process 21.8  15 cm proximal to ulnar styloid process   10 cm proximal to ulnar styloid process 19.1  Just proximal to ulnar styloid process 14.0  Across hand at thumb web space 18.3  At base of 2nd digit 5.9  (Blank rows = not tested)   FUNCTIONAL TESTS:    GAIT: WNL   QUICK DASH SURVEY:   BREAST COMPLAINTS QUESTIONNAIRE Pain:9 Heaviness:8 Swollen feeling:9 Tense Skin:7 Redness:0 Bra Print:10 Size of Pores:8 Hard feeling: 6 Total:   57  /80 A Score over 9 indicates lymphedema issues in the breast   TODAY'S TREATMENT:                                                                                                                                          DATE:  10/22/2023 Discussed POC, treatment interventions. Practiced stargazer stretch and LTR with arms extended to start pectoral stretching for HEP   PATIENT EDUCATION:  Education details: Discussed POC, treatment  interventions. Practiced stargazer stretch and LTR with arms extended to start pectoral stretching and to do at home Person educated: Patient Education method: Explanation Education comprehension: verbalized understanding and returned demonstration  HOME EXERCISE PROGRAM: To start back to stargazer and LTR to stretch pectorals  ASSESSMENT:  CLINICAL IMPRESSION: Patient is a 62 y.o. female who was seen today for physical therapy evaluation and treatment for complaints of left breast swelling and tenderness and complaints of cording. She is s/p left lumpectomy with SLNB on 09/20/2022 with 2+/14 LN's and discontinued radiation early on 1/ 24/2024. She experienced left breast swelling in March and was treated previously for breast swelling, limited shoulder ROM and cording.  She was doing quite well until end of November when breast swelling increased significantly, and she started feeling tightness and cording. She presents with limitations in left shoulder ROM, tenderness in the left upper quarter musculature, visible cording in the left axilla/upper arm.  Breast is with generalized swelling, fibrosis noted mildly medially and laterally at breast incision. She is tender throughout the breast. She will benefit from skilled PT to address deficits and return pt to a more functional lifestyle.  OBJECTIVE IMPAIRMENTS: decreased activity tolerance, decreased knowledge of condition, decreased ROM, decreased strength, increased edema,  impaired UE functional use, postural dysfunction, and pain.   ACTIVITY LIMITATIONS: carrying, sleeping, reach over head, and hygiene/grooming  PARTICIPATION LIMITATIONS:  pt is doing all but with some breast pain/discomfort, limitations in left shoulder ROM  PERSONAL FACTORS:  3+ comorbidities: Left breast cancer s/p infusions and radiation  are also affecting patient's functional outcome.    REHAB POTENTIAL: Excellent  CLINICAL DECISION MAKING:  Stable/uncomplicated  EVALUATION COMPLEXITY: Low  GOALS: Goals reviewed with patient? Yes  SHORT TERM GOALS=LONG TERM GOALS: Target date: 12/02/2022    Pt will be independent in self MLD for reducing left breast edema Baseline: Goal status: INITIAL   2.  Pt will note decreased breast swelling/tenderness by atleast 50% Baseline:  Goal status: INITIAL   3.  Pt will be able to lie on her left side to sleep for short periods of time Baseline:  Goal status: INITIAL   4.  Pt will have left shoulder ROM  flex and abd Within 5-10 degrees of right without increased pain  for improved reaching Baseline:R  flex 165, abd 180 Goal status: INITIAL   5.  Breast complaints survey will be reduced to no greater than 20 Baseline: 57 Goal status: INITIAL    PLAN:  PT FREQUENCY: 2x/week  PT DURATION: 6 weeks  PLANNED INTERVENTIONS: 97164- PT Re-evaluation, 97110-Therapeutic exercises, 97112- Neuromuscular re-education, 97535- Self Care, 57846- Manual therapy, 97760- Orthotic Fit/training, 97016- Vasopneumatic device, Patient/Family education, Manual lymph drainage, Scar mobilization, Therapeutic exercises, Therapeutic activity, Neuromuscular re-education, and Self Care, Dry needling.  PLAN FOR NEXT SESSION: STM left UQ, update HEP with stretches, PROM left, Left breast MLD,  Waynette Buttery, PT 10/22/2023, 2:06 PM

## 2023-10-22 ENCOUNTER — Ambulatory Visit: Payer: BC Managed Care – PPO | Attending: Hematology and Oncology

## 2023-10-22 ENCOUNTER — Other Ambulatory Visit: Payer: Self-pay

## 2023-10-22 DIAGNOSIS — I890 Lymphedema, not elsewhere classified: Secondary | ICD-10-CM | POA: Insufficient documentation

## 2023-10-22 DIAGNOSIS — Z483 Aftercare following surgery for neoplasm: Secondary | ICD-10-CM | POA: Insufficient documentation

## 2023-10-22 DIAGNOSIS — Z170 Estrogen receptor positive status [ER+]: Secondary | ICD-10-CM | POA: Diagnosis not present

## 2023-10-22 DIAGNOSIS — C50412 Malignant neoplasm of upper-outer quadrant of left female breast: Secondary | ICD-10-CM | POA: Diagnosis not present

## 2023-10-22 DIAGNOSIS — M25612 Stiffness of left shoulder, not elsewhere classified: Secondary | ICD-10-CM | POA: Insufficient documentation

## 2023-10-30 ENCOUNTER — Ambulatory Visit: Payer: BC Managed Care – PPO

## 2023-10-30 DIAGNOSIS — M25612 Stiffness of left shoulder, not elsewhere classified: Secondary | ICD-10-CM

## 2023-10-30 DIAGNOSIS — Z483 Aftercare following surgery for neoplasm: Secondary | ICD-10-CM | POA: Diagnosis not present

## 2023-10-30 DIAGNOSIS — C50412 Malignant neoplasm of upper-outer quadrant of left female breast: Secondary | ICD-10-CM

## 2023-10-30 DIAGNOSIS — I890 Lymphedema, not elsewhere classified: Secondary | ICD-10-CM

## 2023-10-30 NOTE — Therapy (Signed)
OUTPATIENT PHYSICAL THERAPY  UPPER EXTREMITY ONCOLOGY EVALUATION  Patient Name: Caitlyn Torres MRN: [id number] DOB:16-Jun-1961, 62 y.o., female Today's Date: 10/30/2023  END OF SESSION:  PT End of Session - 10/30/23 0807     Visit Number 2    Number of Visits 12    Date for PT Re-Evaluation 12/03/23    PT Start Time 0823   pt late;stuck in traffic   PT Stop Time 0856    PT Time Calculation (min) 33 min    Activity Tolerance Patient tolerated treatment well    Behavior During Therapy [facility] for tasks assessed/performed             Past Medical History:  Diagnosis Date   Anxiety    Arthritis    Cancer (HCC) 03/2022   L breast   Depression    Family history of breast cancer 05/15/2022   Family history of pancreatic cancer 05/15/2022   Family history of prostate cancer 05/15/2022   GERD (gastroesophageal reflux disease)    History of hiatal hernia    patient believes she may have been told she has this years ago   Past Surgical History:  Procedure Laterality Date   BREAST BIOPSY  09/19/2022   MM LT RADIOACTIVE SEED EA ADD LESION LOC MAMMO GUIDE 09/19/2022 [facility]   BREAST BIOPSY  09/19/2022   MM LT RADIOACTIVE SEED LOC MAMMO GUIDE 09/19/2022 [facility]   BREAST LUMPECTOMY WITH RADIOACTIVE SEED AND AXILLARY LYMPH NODE DISSECTION Left 09/20/2022   Procedure: LEFT BREAST SEED LUMPECTOMY, LEFT AXILLARY LYMPH NODE DISSECTION;  Surgeon: Harriette Bouillon, MD;  Location: [facility] OR;  Service: General;  Laterality: Left;  GEN & PEC BLOCK   CERVIX SURGERY     Laser removal of pre-cancer cells   LASIK Bilateral    PORTACATH PLACEMENT Right 04/20/2022   Procedure: PORT PLACEMENT;  Surgeon: Harriette Bouillon, MD;  Location: [facility] OR;  Service: General;  Laterality: Right;   Patient Active Problem List   Diagnosis Date Noted   Other fatigue 08/28/2022   Acute pain 08/28/2022   Genetic testing 06/01/2022   Family history of breast cancer 05/15/2022   Family history of  pancreatic cancer 05/15/2022   Family history of prostate cancer 05/15/2022   Port-A-Cath in place 04/28/2022   Malignant neoplasm of upper-outer quadrant of left breast in female, estrogen receptor positive (HCC) 03/29/2022    PCP:   REFERRING PROVIDER: [name], MD   REFERRING DIAG: Left Breast lymphedema/cording  THERAPY DIAG:  Malignant neoplasm of upper-outer quadrant of left female breast, unspecified estrogen receptor status (HCC)  Aftercare following surgery for neoplasm  Lymphedema of breast  Stiffness of left shoulder, not elsewhere classified  ONSET DATE: March but exacerbated end of November  Rationale for Evaluation and Treatment: Rehabilitation  SUBJECTIVE:  SUBJECTIVE STATEMENT:  10/30/2023 I started back to some of the exercises. My chest area is still bad. I am wearing my compression bra , and I have done some of the MLD 1x/day   EVAL I was using the Flexitouch 2 times per week and over time I noticed my breast swelling got worse. I have started doing some manual  lymphatic drainage as well and have been using the machine 4-5 times per week. It has gotten better. I have some cording issues too with some tightness. My breast has been sore at the top of my chest, and medial inferior side, and lateral side.  I have still been wearing the pad in my bra. My breast feels heavy and when I take my bra off it is painful.  I got some new compression bras before Thanksgiving, and one of them is the Wear Ease. I sleep in it every night. I got some cami's and sport bra's  PERTINENT HISTORY:  03/14/2022:Screening detected left breast masses 1.5 cm and 1.3 cm with enlarged left axillary lymph node, biopsy revealed grade 3 IDC with lymph node being positive, ER 95%, PR 0%, HER2 2+ by IHC and  FISH positive with a ratio 2.45 and a copy #4.9, the second biopsy was HER2 negative   1. Neoadjuvant chemotherapy with Herceptin Perjeta and letrozole for 8 cycles.  (Our original recommendation is TCHP x6 cycles but patient is not willing to go through chemo) 2. left lumpectomy and ALND 09/20/2022: 0.6 cm IDC 2/14 lymph nodes, margins negative, ER 95%, PR 0%, HER2 positive 3. Followed by adjuvant radiation therapy  4.  Adjuvant antiestrogen therapy with neratinib She had left lumpectomy on 09/20/2022 with 2/14 LN's  PAIN:  Are you having pain? Yes NPRS scale: 5/10, pain mainly with pressure Pain location: left breast, lateral, and left pectorals Pain orientation: Left and Upper  PAIN TYPE: aching, tight, and sore, heavy Pain description: constant  Aggravating factors: removing bra, movement, walking, jumping,sleeping on left Relieving factors: compression, MLD  PRECAUTIONS: Left UE lymphedema precautions   RED FLAGS: None   WEIGHT BEARING RESTRICTIONS: No  FALLS:  Has patient fallen in last 6 months? 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  COGNITION: Overall cognitive status: Within functional limits for tasks assessed   PALPATION: Tender medial, lateral and proximal left breast, left UT, lats, pectorals  OBSERVATIONS / OTHER ASSESSMENTS:generalized left breast swelling, mild fibrosis near lateral breast incision with tenderness, very few enlarged pores, cording noted left axillary region running below axillary incision to elbow region  SENSATION: Light touch: Deficits    POSTURE:  forward head, rounded shoulders   UPPER  EXTREMITY AROM/PROM:  A/PROM RIGHT   eval   Shoulder extension 57  Shoulder flexion 165  Shoulder abduction 180  Shoulder internal rotation 65  Shoulder external rotation 97    (Blank rows = not tested)  A/PROM LEFT   eval  Shoulder extension 50  Shoulder flexion 149  Shoulder abduction 154  Shoulder internal rotation 48  +  Shoulder external rotation 91    (Blank rows = not tested)  CERVICAL AROM: All within normal limits:   UPPER EXTREMITY STRENGTH:   LYMPHEDEMA ASSESSMENTS:  SURGERY TYPE/DATE:  Left lumpectomy,09/20/2022   NUMBER OF LYMPH NODES REMOVED: 2+/14  CHEMOTHERAPY: No at her request, Did Herceptin, Perjeta and letrozole   RADIATION:Yes, but stopped early at her request  HORMONE TREATMENT: YES  INFECTIONS: NO   LYMPHEDEMA ASSESSMENTS:   LANDMARK RIGHT  eval  At axilla  25.2  15 cm proximal to olecranon process   10 cm proximal to olecranon process 22.7  Olecranon process 21.6  15 cm proximal to ulnar styloid process   10 cm proximal to ulnar styloid process 20.3  Just proximal to ulnar styloid process 14.2  Across hand at thumb web space 19.3  At base of 2nd digit 5.7  (Blank rows = not tested)  LANDMARK LEFT  eval  At axilla  25.2  15 cm proximal to olecranon process   10 cm proximal to olecranon process 22.2  Olecranon process 21.8  15 cm proximal to ulnar styloid process   10 cm proximal to ulnar styloid process 19.1  Just proximal to ulnar styloid process 14.0  Across hand at thumb web space 18.3  At base of 2nd digit 5.9  (Blank rows = not tested)   FUNCTIONAL TESTS:    GAIT: WNL   QUICK DASH SURVEY:   BREAST COMPLAINTS QUESTIONNAIRE Pain:9 Heaviness:8 Swollen feeling:9 Tense Skin:7 Redness:0 Bra Print:10 Size of Pores:8 Hard feeling: 6 Total:   57  /80 A Score over 9 indicates lymphedema issues in the breast   TODAY'S TREATMENT:                                                                                                                                           DATE:   10/30/2023 STM to bilateral UT, left pectorals and lateral trunk with cocoa butter Stargazer stretch x 5, supine wand flex x 3 held due to left shoulder pain, VC to depress scapula Supine snow angel x 5 MFR to left UE cording PROM left shoulder with MFR to left axillary region, flex, abduction, ER    10/22/2023 Discussed POC, treatment interventions. Practiced stargazer stretch and LTR with arms extended to start pectoral stretching for HEP   PATIENT EDUCATION:  Education details: Discussed POC, treatment interventions. Practiced stargazer stretch and LTR with arms extended to start pectoral stretching and to do at home Person educated: Patient Education method: Explanation Education comprehension: verbalized understanding and returned demonstration  HOME EXERCISE PROGRAM: To start back to stargazer and LTR to stretch pectorals  ASSESSMENT:  CLINICAL IMPRESSION: Tight and tender throughout left UQ with multiple shortened areas. Some c/o left shoulder pain with wand exs and possible cord posteriorly that can not be palpated. Pt had good tolerance for PROM and felt better after rx  OBJECTIVE IMPAIRMENTS: decreased activity tolerance, decreased knowledge of condition, decreased ROM, decreased strength, increased edema, impaired UE functional use, postural dysfunction, and pain.   ACTIVITY LIMITATIONS: carrying, sleeping, reach over  head, and hygiene/grooming  PARTICIPATION LIMITATIONS:  pt is doing all but with some breast pain/discomfort, limitations in left shoulder ROM  PERSONAL FACTORS:  3+ comorbidities: Left breast cancer s/p infusions and radiation  are also affecting patient's functional outcome.    REHAB POTENTIAL: Excellent  CLINICAL DECISION MAKING: Stable/uncomplicated  EVALUATION COMPLEXITY: Low  GOALS: Goals reviewed with patient? Yes  SHORT TERM GOALS=LONG TERM GOALS: Target date: 12/02/2022     Pt will be independent in self MLD for reducing left breast edema Baseline: Goal status: INITIAL   2.  Pt will note decreased breast swelling/tenderness by atleast 50% Baseline:  Goal status: INITIAL   3.  Pt will be able to lie on her left side to sleep for short periods of time Baseline:  Goal status: INITIAL   4.  Pt will have left shoulder ROM  flex and abd Within 5-10 degrees of right without increased pain  for improved reaching Baseline:R  flex 165, abd 180 Goal status: INITIAL   5.  Breast complaints survey will be reduced to no greater than 20 Baseline: 57 Goal status: INITIAL    PLAN:  PT FREQUENCY: 2x/week  PT DURATION: 6 weeks  PLANNED INTERVENTIONS: 97164- PT Re-evaluation, 97110-Therapeutic exercises, 97112- Neuromuscular re-education, 97535- Self Care, 57846- Manual therapy, 97760- Orthotic Fit/training, 97016- Vasopneumatic device, Patient/Family education, Manual lymph drainage, Scar mobilization, Therapeutic exercises, Therapeutic activity, Neuromuscular re-education, and Self Care, Dry needling.  PLAN FOR NEXT SESSION: STM left UQ,  MFR cording left,update HEP with stretches, PROM left, Left breast MLD,  Waynette Buttery, PT 10/30/2023, 9:01 AM

## 2023-10-31 ENCOUNTER — Ambulatory Visit: Payer: BC Managed Care – PPO

## 2023-10-31 DIAGNOSIS — Z483 Aftercare following surgery for neoplasm: Secondary | ICD-10-CM | POA: Diagnosis not present

## 2023-10-31 DIAGNOSIS — I890 Lymphedema, not elsewhere classified: Secondary | ICD-10-CM

## 2023-10-31 DIAGNOSIS — M25612 Stiffness of left shoulder, not elsewhere classified: Secondary | ICD-10-CM

## 2023-10-31 DIAGNOSIS — C50412 Malignant neoplasm of upper-outer quadrant of left female breast: Secondary | ICD-10-CM

## 2023-10-31 NOTE — Therapy (Signed)
OUTPATIENT PHYSICAL THERAPY  UPPER EXTREMITY ONCOLOGY EVALUATION  Patient Name: SHANIQWA LEITZ MRN: [id number] DOB:08-Oct-1961, 62 y.o., female Today's Date: 10/31/2023  END OF SESSION:  PT End of Session - 10/31/23 1015     Visit Number 3    Number of Visits 12    Date for PT Re-Evaluation 12/03/23    PT Start Time 1015   pt  late   PT Stop Time 1103    PT Time Calculation (min) 48 min    Activity Tolerance Patient tolerated treatment well    Behavior During Therapy [facility] for tasks assessed/performed             Past Medical History:  Diagnosis Date   Anxiety    Arthritis    Cancer (HCC) 03/2022   L breast   Depression    Family history of breast cancer 05/15/2022   Family history of pancreatic cancer 05/15/2022   Family history of prostate cancer 05/15/2022   GERD (gastroesophageal reflux disease)    History of hiatal hernia    patient believes she may have been told she has this years ago   Past Surgical History:  Procedure Laterality Date   BREAST BIOPSY  09/19/2022   MM LT RADIOACTIVE SEED EA ADD LESION LOC MAMMO GUIDE 09/19/2022 [facility]AP[facility]   BREAST BIOPSY  09/19/2022   MM LT RADIOACTIVE SEED LOC MAMMO GUIDE 09/19/2022 [facility]   BREAST LUMPECTOMY WITH RADIOACTIVE SEED AND AXILLARY LYMPH NODE DISSECTION Left 09/20/2022   Procedure: LEFT BREAST SEED LUMPECTOMY, LEFT AXILLARY LYMPH NODE DISSECTION;  Surgeon: Harriette Bouillon, MD;  Location: [facility] OR;  Service: General;  Laterality: Left;  GEN & PEC BLOCK   CERVIX SURGERY     Laser removal of pre-cancer cells   LASIK Bilateral    PORTACATH PLACEMENT Right 04/20/2022   Procedure: PORT PLACEMENT;  Surgeon: Harriette Bouillon, MD;  Location: [facility] OR;  Service: General;  Laterality: Right;   Patient Active Problem List   Diagnosis Date Noted   Other fatigue 08/28/2022   Acute pain 08/28/2022   Genetic testing 06/01/2022   Family history of breast cancer 05/15/2022   Family history of pancreatic cancer  05/15/2022   Family history of prostate cancer 05/15/2022   Port-A-Cath in place 04/28/2022   Malignant neoplasm of upper-outer quadrant of left breast in female, estrogen receptor positive (HCC) 03/29/2022    PCP:   REFERRING PROVIDER: [name], MD   REFERRING DIAG: Left Breast lymphedema/cording  THERAPY DIAG:  Malignant neoplasm of upper-outer quadrant of left female breast, unspecified estrogen receptor status (HCC)  Aftercare following surgery for neoplasm  Lymphedema of breast  Stiffness of left shoulder, not elsewhere classified  ONSET DATE: March but exacerbated end of November  Rationale for Evaluation and Treatment: Rehabilitation  SUBJECTIVE:  SUBJECTIVE STATEMENT:   10/31/2023 I think I did OK after yesterday.  My left breast feels heavy today and my muscles are tight.  EVAL I was using the Flexitouch 2 times per week and over time I noticed my breast swelling got worse. I have started doing some manual  lymphatic drainage as well and have been using the machine 4-5 times per week. It has gotten better. I have some cording issues too with some tightness. My breast has been sore at the top of my chest, and medial inferior side, and lateral side.  I have still been wearing the pad in my bra. My breast feels heavy and when I take my bra off it is painful.  I got some new compression bras before Thanksgiving, and one of them is the Wear Ease. I sleep in it every night. I got some cami's and sport bra's  PERTINENT HISTORY:  03/14/2022:Screening detected left breast masses 1.5 cm and 1.3 cm with enlarged left axillary lymph node, biopsy revealed grade 3 IDC with lymph node being positive, ER 95%, PR 0%, HER2 2+ by IHC and FISH positive with a ratio 2.45 and a copy #4.9, the second biopsy  was HER2 negative   1. Neoadjuvant chemotherapy with Herceptin Perjeta and letrozole for 8 cycles.  (Our original recommendation is TCHP x6 cycles but patient is not willing to go through chemo) 2. left lumpectomy and ALND 09/20/2022: 0.6 cm IDC 2/14 lymph nodes, margins negative, ER 95%, PR 0%, HER2 positive 3. Followed by adjuvant radiation therapy  4.  Adjuvant antiestrogen therapy with neratinib She had left lumpectomy on 09/20/2022 with 2/14 LN's  PAIN:  Are you having pain? Yes NPRS scale: 4/10, left shoulder blade, pain mainly with pressure Pain location: left breast, lateral, and left pectorals Pain orientation: Left and Upper  PAIN TYPE: aching, tight, and sore, heavy Pain description: constant  Aggravating factors: removing bra, movement, walking, jumping,sleeping on left Relieving factors: compression, MLD  PRECAUTIONS: Left UE lymphedema precautions   RED FLAGS: None   WEIGHT BEARING RESTRICTIONS: No  FALLS:  Has patient fallen in last 6 months? 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  COGNITION: Overall cognitive status: Within functional limits for tasks assessed   PALPATION: Tender medial, lateral and proximal left breast, left UT, lats, pectorals  OBSERVATIONS / OTHER ASSESSMENTS:generalized left breast swelling, mild fibrosis near lateral breast incision with tenderness, very few enlarged pores, cording noted left axillary region running below axillary incision to elbow region  SENSATION: Light touch: Deficits    POSTURE:  forward head, rounded shoulders   UPPER EXTREMITY AROM/PROM:  A/PROM RIGHT   eval    Shoulder extension 57  Shoulder flexion 165  Shoulder abduction 180  Shoulder internal rotation 65  Shoulder external rotation 97    (Blank rows = not tested)  A/PROM LEFT   eval  Shoulder extension 50  Shoulder flexion 149  Shoulder abduction 154  Shoulder internal rotation 48  +  Shoulder external rotation 91    (Blank rows = not tested)  CERVICAL AROM: All within normal limits:   UPPER EXTREMITY STRENGTH:   LYMPHEDEMA ASSESSMENTS:   SURGERY TYPE/DATE:  Left lumpectomy,09/20/2022  NUMBER OF LYMPH NODES REMOVED: 2+/14  CHEMOTHERAPY: No at her request, Did Herceptin, Perjeta and letrozole   RADIATION:Yes, but stopped early at her request  HORMONE TREATMENT: YES  INFECTIONS: NO   LYMPHEDEMA ASSESSMENTS:   LANDMARK RIGHT  eval  At axilla  25.2  15 cm proximal to olecranon process   10 cm proximal to olecranon process 22.7  Olecranon process 21.6  15 cm proximal to ulnar styloid process   10 cm proximal to ulnar styloid process 20.3  Just proximal to ulnar styloid process 14.2  Across hand at thumb web space 19.3  At base of 2nd digit 5.7  (Blank rows = not tested)  LANDMARK LEFT  eval  At axilla  25.2  15 cm proximal to olecranon process   10 cm proximal to olecranon process 22.2  Olecranon process 21.8  15 cm proximal to ulnar styloid process   10 cm proximal to ulnar styloid process 19.1  Just proximal to ulnar styloid process 14.0  Across hand at thumb web space 18.3  At base of 2nd digit 5.9  (Blank rows = not tested)   FUNCTIONAL TESTS:    GAIT: WNL   QUICK DASH SURVEY:   BREAST COMPLAINTS QUESTIONNAIRE Pain:9 Heaviness:8 Swollen feeling:9 Tense Skin:7 Redness:0 Bra Print:10 Size of Pores:8 Hard feeling: 6 Total:   57  /80 A Score over 9 indicates lymphedema issues in the breast   TODAY'S TREATMENT:                                                                                                                                           DATE:   10/31/2023 STM to bilateral UT, left pectorals and lateral trunk and left scapular area with cocoa butter Initiated left breast MLD to address breast heaviness In supine: Short neck, 5 diaphragmatic breaths, R axillary nodes and establishment of interaxillary pathway, L inguinal nodes and establishment of axilloinguinal pathway, then L breast moving fluid towards pathways , and in SL to posterior interaxillary Anastomosis,spending extra time in any areas of fibrosis then retracing all steps and ending with LN's. Small nodular area that is moveable noted in left inferior medial breast. Pt will be having an [place] in January and will get it checked.   10/30/2023 STM to bilateral UT, left pectorals and lateral trunk with cocoa butter Stargazer stretch x 5, supine wand flex x 3 held due to left shoulder pain, VC to depress scapula Supine snow angel x 5 MFR to left UE cording PROM left shoulder with MFR to left axillary region, flex, abduction, ER   10/22/2023 Discussed POC, treatment interventions. Practiced stargazer stretch and LTR with arms extended to start pectoral stretching for HEP   PATIENT EDUCATION:  Education details: Discussed POC, treatment interventions. Practiced stargazer stretch and LTR with arms extended to start pectoral stretching and to do at home Person educated: Patient Education method:  Explanation Education comprehension: verbalized understanding and returned demonstration  HOME EXERCISE PROGRAM: To start back to stargazer and LTR to stretch pectorals  ASSESSMENT:  CLINICAL IMPRESSION: Continued tightness with multiple shortened , tender areas in left pectorals and  left scapular area. Small nodular area that is moveable noted in left inferior-medial breast today and brought to pts attention. Pt is having an [place] in January and will have this checked.  OBJECTIVE IMPAIRMENTS: decreased activity tolerance, decreased knowledge of condition, decreased ROM,  decreased strength, increased edema, impaired UE functional use, postural dysfunction, and pain.   ACTIVITY LIMITATIONS: carrying, sleeping, reach over head, and hygiene/grooming  PARTICIPATION LIMITATIONS:  pt is doing all but with some breast pain/discomfort, limitations in left shoulder ROM  PERSONAL FACTORS:  3+ comorbidities: Left breast cancer s/p infusions and radiation  are also affecting patient's functional outcome.    REHAB POTENTIAL: Excellent  CLINICAL DECISION MAKING: Stable/uncomplicated  EVALUATION COMPLEXITY: Low  GOALS: Goals reviewed with patient? Yes  SHORT TERM GOALS=LONG TERM GOALS: Target date: 12/02/2022    Pt will be independent in self MLD for reducing left breast edema Baseline: Goal status: INITIAL   2.  Pt will note decreased breast swelling/tenderness by atleast 50% Baseline:  Goal status: INITIAL   3.  Pt will be able to lie on her left side to sleep for short periods of time Baseline:  Goal status: INITIAL   4.  Pt will have left shoulder ROM  flex and abd Within 5-10 degrees of right without increased pain  for improved reaching Baseline:R  flex 165, abd 180 Goal status: INITIAL   5.  Breast complaints survey will be reduced to no greater than 20 Baseline: 57 Goal status: INITIAL    PLAN:  PT FREQUENCY: 2x/week  PT DURATION: 6 weeks  PLANNED INTERVENTIONS: 97164- PT Re-evaluation, 97110-Therapeutic exercises, 97112- Neuromuscular re-education, 97535- Self Care, 09811- Manual therapy, 97760- Orthotic Fit/training, 97016- Vasopneumatic device, Patient/Family education, Manual lymph drainage, Scar mobilization, Therapeutic exercises, Therapeutic activity, Neuromuscular re-education, and Self Care, Dry needling.  PLAN FOR NEXT SESSION: STM left UQ,  MFR cording left,update HEP with stretches, PROM left, Left breast MLD,  Waynette Buttery, PT 10/31/2023, 11:05 AM

## 2023-11-06 ENCOUNTER — Ambulatory Visit: Payer: BC Managed Care – PPO

## 2023-11-06 DIAGNOSIS — Z483 Aftercare following surgery for neoplasm: Secondary | ICD-10-CM | POA: Diagnosis not present

## 2023-11-06 DIAGNOSIS — I890 Lymphedema, not elsewhere classified: Secondary | ICD-10-CM

## 2023-11-06 DIAGNOSIS — C50412 Malignant neoplasm of upper-outer quadrant of left female breast: Secondary | ICD-10-CM

## 2023-11-06 DIAGNOSIS — M25612 Stiffness of left shoulder, not elsewhere classified: Secondary | ICD-10-CM

## 2023-11-06 NOTE — Therapy (Signed)
OUTPATIENT PHYSICAL THERAPY  UPPER EXTREMITY ONCOLOGY EVALUATION  Patient Name: Caitlyn Torres MRN: [id number] DOB:[birth year], 62 y.o., female Today's Date: 11/06/2023  END OF SESSION:  PT End of Session - 11/06/23 0807     Visit Number 4    Number of Visits 12    Date for PT Re-Evaluation 12/03/23    PT Start Time 0807    PT Stop Time 0856    PT Time Calculation (min) 49 min    Activity Tolerance Patient tolerated treatment well    Behavior During Therapy [facility] for tasks assessed/performed             Past Medical History:  Diagnosis Date   Anxiety    Arthritis    Cancer (HCC) 03/2022   L breast   Depression    Family history of breast cancer 05/15/2022   Family history of pancreatic cancer 05/15/2022   Family history of prostate cancer 05/15/2022   GERD (gastroesophageal reflux disease)    History of hiatal hernia    patient believes she may have been told she has this years ago   Past Surgical History:  Procedure Laterality Date   BREAST BIOPSY  09/19/2022   MM LT RADIOACTIVE SEED EA ADD LESION LOC MAMMO GUIDE 09/19/2022 [facility]   BREAST BIOPSY  09/19/2022   MM LT RADIOACTIVE SEED LOC MAMMO GUIDE 09/19/2022 [facility]   BREAST LUMPECTOMY WITH RADIOACTIVE SEED AND AXILLARY LYMPH NODE DISSECTION Left 09/20/2022   Procedure: LEFT BREAST SEED LUMPECTOMY, LEFT AXILLARY LYMPH NODE DISSECTION;  Surgeon: Harriette Bouillon, MD;  Location: [facility] OR;  Service: General;  Laterality: Left;  GEN & PEC BLOCK   CERVIX SURGERY     Laser removal of pre-cancer cells   LASIK Bilateral    PORTACATH PLACEMENT Right 04/20/2022   Procedure: PORT PLACEMENT;  Surgeon: Harriette Bouillon, MD;  Location: [facility] OR;  Service: General;  Laterality: Right;   Patient Active Problem List   Diagnosis Date Noted   Other fatigue 08/28/2022   Acute pain 08/28/2022   Genetic testing 06/01/2022   Family history of breast cancer 05/15/2022   Family history of pancreatic cancer 05/15/2022    Family history of prostate cancer 05/15/2022   Port-A-Cath in place 04/28/2022   Malignant neoplasm of upper-outer quadrant of left breast in female, estrogen receptor positive (HCC) 03/29/2022    PCP:   REFERRING PROVIDER: [name], MD   REFERRING DIAG: Left Breast lymphedema/cording  THERAPY DIAG:  Malignant neoplasm of upper-outer quadrant of left female breast, unspecified estrogen receptor status (HCC)  Aftercare following surgery for neoplasm  Lymphedema of breast  Stiffness of left shoulder, not elsewhere classified  ONSET DATE: March but exacerbated end of November  Rationale for Evaluation and Treatment: Rehabilitation  SUBJECTIVE:  SUBJECTIVE STATEMENT:   My lateral breast hurts today, but I have been busy and haven't worn as much compression. I always wear the bra at night. My left shoulder is doing a little better  EVAL I was using the Flexitouch 2 times per week and over time I noticed my breast swelling got worse. I have started doing some manual  lymphatic drainage as well and have been using the machine 4-5 times per week. It has gotten better. I have some cording issues too with some tightness. My breast has been sore at the top of my chest, and medial inferior side, and lateral side.  I have still been wearing the pad in my bra. My breast feels heavy and when I take my bra off it is painful.  I got some new compression bras before Thanksgiving, and one of them is the Wear Ease. I sleep in it every night. I got some cami's and sport bra's  PERTINENT HISTORY:  03/14/2022:Screening detected left breast masses 1.5 cm and 1.3 cm with enlarged left axillary lymph node, biopsy revealed grade 3 IDC with lymph node being positive, ER 95%, PR 0%, HER2 2+ by IHC and FISH positive with a  ratio 2.45 and a copy #4.9, the second biopsy was HER2 negative   1. Neoadjuvant chemotherapy with Herceptin Perjeta and letrozole for 8 cycles.  (Our original recommendation is TCHP x6 cycles but patient is not willing to go through chemo) 2. left lumpectomy and ALND 09/20/2022: 0.6 cm IDC 2/14 lymph nodes, margins negative, ER 95%, PR 0%, HER2 positive 3. Followed by adjuvant radiation therapy  4.  Adjuvant antiestrogen therapy with neratinib She had left lumpectomy on 09/20/2022 with 2/14 LN's  PAIN:  Are you having pain? Yes NPRS scale: 4/10, left breast pain mainly with pressure, neck is still hurting a lot, and shoulder blade 2/10 Pain location: left breast, lateral, and left pectorals Pain orientation: Left and Upper  PAIN TYPE: aching, tight, and sore, heavy Pain description: constant  Aggravating factors: removing bra, movement, walking, jumping,sleeping on left Relieving factors: compression, MLD  PRECAUTIONS: Left UE lymphedema precautions   RED FLAGS: None   WEIGHT BEARING RESTRICTIONS: No  FALLS:  Has patient fallen in last 6 months? 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  COGNITION: Overall cognitive status: Within functional limits for tasks assessed   PALPATION: Tender medial, lateral and proximal left breast, left UT, lats, pectorals  OBSERVATIONS / OTHER ASSESSMENTS:generalized left breast swelling, mild fibrosis near lateral breast incision with tenderness, very few enlarged pores, cording noted left axillary region running below axillary incision to elbow region  SENSATION: Light touch: Deficits    POSTURE:   forward head, rounded shoulders   UPPER EXTREMITY AROM/PROM:  A/PROM RIGHT   eval   Shoulder extension 57  Shoulder flexion 165  Shoulder abduction 180  Shoulder internal rotation 65  Shoulder external rotation 97    (Blank rows = not tested)  A/PROM LEFT   eval  Shoulder extension 50  Shoulder flexion 149  Shoulder abduction 154  Shoulder internal rotation 48  +  Shoulder external rotation 91    (Blank rows = not tested)  CERVICAL AROM: All within  normal limits:   UPPER EXTREMITY STRENGTH:   LYMPHEDEMA ASSESSMENTS:   SURGERY TYPE/DATE:  Left lumpectomy,09/20/2022   NUMBER OF LYMPH NODES REMOVED: 2+/14  CHEMOTHERAPY: No at her request, Did Herceptin, Perjeta and letrozole   RADIATION:Yes, but stopped early at her request  HORMONE TREATMENT: YES  INFECTIONS: NO   LYMPHEDEMA ASSESSMENTS:   LANDMARK RIGHT  eval  At axilla  25.2  15 cm proximal to olecranon process   10 cm proximal to olecranon process 22.7  Olecranon process 21.6  15 cm proximal to ulnar styloid process   10 cm proximal to ulnar styloid process 20.3  Just proximal to ulnar styloid process 14.2  Across hand at thumb web space 19.3  At base of 2nd digit 5.7  (Blank rows = not tested)  LANDMARK LEFT  eval  At axilla  25.2  15 cm proximal to olecranon process   10 cm proximal to olecranon process 22.2  Olecranon process 21.8  15 cm proximal to ulnar styloid process   10 cm proximal to ulnar styloid process 19.1  Just proximal to ulnar styloid process 14.0  Across hand at thumb web space 18.3  At base of 2nd digit 5.9  (Blank rows = not tested)   FUNCTIONAL TESTS:    GAIT: WNL   QUICK DASH SURVEY:   BREAST COMPLAINTS QUESTIONNAIRE Pain:9 Heaviness:8 Swollen feeling:9 Tense Skin:7 Redness:0 Bra Print:10 Size of Pores:8 Hard feeling: 6 Total:   57  /80 A Score over 9 indicates lymphedema issues in the breast   TODAY'S TREATMENT:                                                                                                                                           DATE:   01/06/2023 STM to bilateral UT, left pectorals and lateral trunk and left scapular area with cocoa butter MFR to left upper arm cording Initiated left breast MLD to address breast heaviness In supine: Short neck, 5 diaphragmatic breaths, R axillary nodes and establishment of interaxillary pathway, L inguinal nodes and establishment of axilloinguinal pathway, then L breast moving fluid towards pathways , and in SL to posterior interaxillary Anastomosis,spending extra time in any areas of fibrosis then retracing all steps and ending with LN's..  10/31/2023 STM to bilateral UT, left pectorals and lateral trunk and left scapular area with cocoa butter Initiated left breast MLD to address breast heaviness In supine: Short neck, 5 diaphragmatic breaths, R axillary nodes and establishment of interaxillary pathway, L inguinal nodes and establishment of axilloinguinal pathway, then L breast moving fluid towards pathways , and in SL to posterior interaxillary Anastomosis,spending extra time in any areas of fibrosis then retracing all steps and ending with LN's. Small nodular area that is moveable noted in left inferior medial breast. Pt will be having an [place] in January and will get it checked.   10/30/2023 STM to  bilateral UT, left pectorals and lateral trunk with cocoa butter Stargazer stretch x 5, supine wand flex x 3 held due to left shoulder pain, VC to depress scapula Supine snow angel x 5 MFR to left UE cording PROM left shoulder with MFR to left axillary region, flex, abduction, ER   10/22/2023 Discussed POC, treatment interventions. Practiced stargazer stretch and LTR with arms extended to start pectoral stretching for HEP   PATIENT EDUCATION:  Education details: Discussed POC, treatment interventions. Practiced stargazer stretch and LTR with arms extended to start pectoral stretching and to  do at home Person educated: Patient Education method: Explanation Education comprehension: verbalized understanding and returned demonstration  HOME EXERCISE PROGRAM: To start back to stargazer and LTR to stretch pectorals  ASSESSMENT:  CLINICAL IMPRESSION: Significant tenderness/ tightness in bilateral UT, Left pectorals, lateral trunk and scapular region with twitch responses.  OBJECTIVE IMPAIRMENTS: decreased activity tolerance, decreased knowledge of condition, decreased ROM, decreased strength, increased edema, impaired UE functional use, postural dysfunction, and pain.   ACTIVITY LIMITATIONS: carrying, sleeping, reach over head, and hygiene/grooming  PARTICIPATION LIMITATIONS:  pt is doing all but with some breast pain/discomfort, limitations in left shoulder ROM  PERSONAL FACTORS:  3+ comorbidities: Left breast cancer s/p infusions and radiation  are also affecting patient's functional outcome.    REHAB POTENTIAL: Excellent  CLINICAL DECISION MAKING: Stable/uncomplicated  EVALUATION COMPLEXITY: Low  GOALS: Goals reviewed with patient? Yes  SHORT TERM GOALS=LONG TERM GOALS: Target date: 12/02/2022    Pt will be independent in self MLD for reducing left breast edema Baseline: Goal status: INITIAL   2.  Pt will note decreased breast swelling/tenderness by atleast 50% Baseline:  Goal status: INITIAL   3.  Pt will be able to lie on her left side to sleep for short periods of time Baseline:  Goal status: INITIAL   4.  Pt will have left shoulder ROM  flex and abd Within 5-10 degrees of right without increased pain  for improved reaching Baseline:R  flex 165, abd 180 Goal status: INITIAL   5.  Breast complaints survey will be reduced to no greater than 20 Baseline: 57 Goal status: INITIAL    PLAN:  PT FREQUENCY: 2x/week  PT DURATION: 6 weeks  PLANNED INTERVENTIONS: 97164- PT Re-evaluation, 97110-Therapeutic exercises, 97112- Neuromuscular re-education,  97535- Self Care, 95638- Manual therapy, 97760- Orthotic Fit/training, 97016- Vasopneumatic device, Patient/Family education, Manual lymph drainage, Scar mobilization, Therapeutic exercises, Therapeutic activity, Neuromuscular re-education, and Self Care, Dry needling.  PLAN FOR NEXT SESSION: STM left UQ,  MFR cording left,update HEP with stretches, PROM left, Left breast MLD,  Waynette Buttery, PT 11/06/2023, 8:56 AM

## 2023-11-08 ENCOUNTER — Ambulatory Visit: Payer: BC Managed Care – PPO

## 2023-11-08 DIAGNOSIS — Z483 Aftercare following surgery for neoplasm: Secondary | ICD-10-CM

## 2023-11-08 DIAGNOSIS — M25612 Stiffness of left shoulder, not elsewhere classified: Secondary | ICD-10-CM

## 2023-11-08 DIAGNOSIS — I890 Lymphedema, not elsewhere classified: Secondary | ICD-10-CM

## 2023-11-08 DIAGNOSIS — C50412 Malignant neoplasm of upper-outer quadrant of left female breast: Secondary | ICD-10-CM

## 2023-11-08 NOTE — Therapy (Signed)
OUTPATIENT PHYSICAL THERAPY  UPPER EXTREMITY ONCOLOGY EVALUATION  Patient Name: Caitlyn Torres MRN: [id number] DOB:10/22/1961, 62 y.o., female Today's Date: 11/08/2023  END OF SESSION:  PT End of Session - 11/08/23 1004     Visit Number 5    Number of Visits 12    Date for PT Re-Evaluation 12/03/23    PT Start Time 1005    PT Stop Time 1053    PT Time Calculation (min) 48 min    Activity Tolerance Patient tolerated treatment well    Behavior During Therapy [facility] for tasks assessed/performed             Past Medical History:  Diagnosis Date   Anxiety    Arthritis    Cancer (HCC) 03/2022   L breast   Depression    Family history of breast cancer 05/15/2022   Family history of pancreatic cancer 05/15/2022   Family history of prostate cancer 05/15/2022   GERD (gastroesophageal reflux disease)    History of hiatal hernia    patient believes she may have been told she has this years ago   Past Surgical History:  Procedure Laterality Date   BREAST BIOPSY  09/19/2022   MM LT RADIOACTIVE SEED EA ADD LESION LOC MAMMO GUIDE 09/19/2022 [facility]   BREAST BIOPSY  09/19/2022   MM LT RADIOACTIVE SEED LOC MAMMO GUIDE 09/19/2022 [facility]   BREAST LUMPECTOMY WITH RADIOACTIVE SEED AND AXILLARY LYMPH NODE DISSECTION Left 09/20/2022   Procedure: LEFT BREAST SEED LUMPECTOMY, LEFT AXILLARY LYMPH NODE DISSECTION;  Surgeon: Harriette Bouillon, MD;  Location: [facility] OR;  Service: General;  Laterality: Left;  GEN & PEC BLOCK   CERVIX SURGERY     Laser removal of pre-cancer cells   LASIK Bilateral    PORTACATH PLACEMENT Right 04/20/2022   Procedure: PORT PLACEMENT;  Surgeon: Harriette Bouillon, MD;  Location: [facility] OR;  Service: General;  Laterality: Right;   Patient Active Problem List   Diagnosis Date Noted   Other fatigue 08/28/2022   Acute pain 08/28/2022   Genetic testing 06/01/2022   Family history of breast cancer 05/15/2022   Family history of pancreatic cancer 05/15/2022    Family history of prostate cancer 05/15/2022   Port-A-Cath in place 04/28/2022   Malignant neoplasm of upper-outer quadrant of left breast in female, estrogen receptor positive (HCC) 03/29/2022    PCP:   REFERRING PROVIDER: [name], MD   REFERRING DIAG: Left Breast lymphedema/cording  THERAPY DIAG:  Malignant neoplasm of upper-outer quadrant of left female breast, unspecified estrogen receptor status (HCC)  Aftercare following surgery for neoplasm  Lymphedema of breast  Stiffness of left shoulder, not elsewhere classified  ONSET DATE: March but exacerbated end of November  Rationale for Evaluation and Treatment: Rehabilitation  SUBJECTIVE:  SUBJECTIVE STATEMENT:  I feel better for the moment but I still feel very tight in my neck today. I haven't done MLD for a few days, but I have been wearing compression  EVAL I was using the Flexitouch 2 times per week and over time I noticed my breast swelling got worse. I have started doing some manual  lymphatic drainage as well and have been using the machine 4-5 times per week. It has gotten better. I have some cording issues too with some tightness. My breast has been sore at the top of my chest, and medial inferior side, and lateral side.  I have still been wearing the pad in my bra. My breast feels heavy and when I take my bra off it is painful.  I got some new compression bras before Thanksgiving, and one of them is the Wear Ease. I sleep in it every night. I got some cami's and sport bra's  PERTINENT HISTORY:  03/14/2022:Screening detected left breast masses 1.5 cm and 1.3 cm with enlarged left axillary lymph node, biopsy revealed grade 3 IDC with lymph node being positive, ER 95%, PR 0%, HER2 2+ by IHC and FISH positive with a ratio 2.45 and a copy  #4.9, the second biopsy was HER2 negative   1. Neoadjuvant chemotherapy with Herceptin Perjeta and letrozole for 8 cycles.  (Our original recommendation is TCHP x6 cycles but patient is not willing to go through chemo) 2. left lumpectomy and ALND 09/20/2022: 0.6 cm IDC 2/14 lymph nodes, margins negative, ER 95%, PR 0%, HER2 positive 3. Followed by adjuvant radiation therapy  4.  Adjuvant antiestrogen therapy with neratinib She had left lumpectomy on 09/20/2022 with 2/14 LN's  PAIN:  Are you having pain? Yes NPRS scale: 3-4/10, left breast pain mainly with pressure, neck is still hurting 4/10 Pain location: left breast, lateral, and left pectorals Pain orientation: Left and Upper  PAIN TYPE: aching, tight, and sore, heavy Pain description: constant  Aggravating factors: removing bra, movement, walking, jumping,sleeping on left Relieving factors: compression, MLD  PRECAUTIONS: Left UE lymphedema precautions   RED FLAGS: None   WEIGHT BEARING RESTRICTIONS: No  FALLS:  Has patient fallen in last 6 months? 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  COGNITION: Overall cognitive status: Within functional limits for tasks assessed   PALPATION: Tender medial, lateral and proximal left breast, left UT, lats, pectorals  OBSERVATIONS / OTHER ASSESSMENTS:generalized left breast swelling, mild fibrosis near lateral breast incision with tenderness, very few enlarged pores, cording noted left axillary region running below axillary incision to elbow region  SENSATION: Light touch: Deficits    POSTURE:  forward head, rounded shoulders   UPPER  EXTREMITY AROM/PROM:  A/PROM RIGHT   eval   Shoulder extension 57  Shoulder flexion 165  Shoulder abduction 180  Shoulder internal rotation 65  Shoulder external rotation 97    (Blank rows = not tested)  A/PROM LEFT   eval LEFT 11/08/2023  Shoulder extension 50   Shoulder flexion 149 160  Shoulder abduction 154 168  Shoulder internal rotation 48  +   Shoulder external rotation 91     (Blank rows = not tested)  CERVICAL AROM: All within  normal limits:   UPPER EXTREMITY STRENGTH:   LYMPHEDEMA ASSESSMENTS:   SURGERY TYPE/DATE:  Left lumpectomy,09/20/2022   NUMBER OF LYMPH NODES REMOVED: 2+/14  CHEMOTHERAPY: No at her request, Did Herceptin, Perjeta and letrozole   RADIATION:Yes, but stopped early at her request  HORMONE TREATMENT: YES  INFECTIONS: NO   LYMPHEDEMA ASSESSMENTS:   LANDMARK RIGHT  eval  At axilla  25.2  15 cm proximal to olecranon process   10 cm proximal to olecranon process 22.7  Olecranon process 21.6  15 cm proximal to ulnar styloid process   10 cm proximal to ulnar styloid process 20.3  Just proximal to ulnar styloid process 14.2  Across hand at thumb web space 19.3  At base of 2nd digit 5.7  (Blank rows = not tested)  LANDMARK LEFT  eval  At axilla  25.2  15 cm proximal to olecranon process   10 cm proximal to olecranon process 22.2  Olecranon process 21.8  15 cm proximal to ulnar styloid process   10 cm proximal to ulnar styloid process 19.1  Just proximal to ulnar styloid process 14.0  Across hand at thumb web space 18.3  At base of 2nd digit 5.9  (Blank rows = not tested)   FUNCTIONAL TESTS:    GAIT: WNL   QUICK DASH SURVEY:   BREAST COMPLAINTS QUESTIONNAIRE Pain:9 Heaviness:8 Swollen feeling:9 Tense Skin:7 Redness:0 Bra Print:10 Size of Pores:8 Hard feeling: 6 Total:   57  /80 A Score over 9 indicates lymphedema issues in the breast   TODAY'S TREATMENT:                                                                                                                                           DATE:  11/08/2023 Ball rolls on wall x 10 forward and 5 abd Pec stretch on doorway x 3 Supine snow angel x 5, scaption x 5, horizontal abd x 5 MFR to left upper arm cording STM to bilateral UT,and lateral trunk and left scapular area with cocoa butter PROM left shoulder flexion, abduction, ER Measured AROM left shoulder 11/06/2023 STM to bilateral UT, left pectorals and lateral trunk and left scapular area with cocoa butter MFR to left upper arm cording Initiated left breast MLD to address breast heaviness In supine: Short neck, 5 diaphragmatic breaths, R axillary nodes and establishment of interaxillary pathway, L inguinal nodes and establishment of axilloinguinal pathway, then L breast moving fluid towards pathways , and in SL to posterior interaxillary Anastomosis,spending extra time in any areas of fibrosis then retracing all steps and ending with LN's..  10/31/2023 STM to bilateral UT, left pectorals and lateral trunk and left scapular area with cocoa butter Initiated left breast MLD to address breast heaviness In supine: Short neck, 5 diaphragmatic breaths, R axillary nodes and establishment of interaxillary pathway, L inguinal nodes and establishment of axilloinguinal pathway, then L breast  moving fluid towards pathways , and in SL to posterior interaxillary Anastomosis,spending extra time in any areas of fibrosis then retracing all steps and ending with LN's. Small nodular area that is moveable noted in left inferior medial breast. Pt will be having an [place] in January and will get it checked.   10/30/2023 STM to bilateral UT, left pectorals and lateral trunk with cocoa butter Stargazer stretch x 5, supine wand flex x 3 held due to left shoulder pain, VC to depress scapula Supine snow angel x 5 MFR to left UE cording PROM left shoulder with MFR to left axillary region, flex, abduction,  ER   10/22/2023 Discussed POC, treatment interventions. Practiced stargazer stretch and LTR with arms extended to start pectoral stretching for HEP   PATIENT EDUCATION:  Education details: Discussed POC, treatment interventions. Practiced stargazer stretch and LTR with arms extended to start pectoral stretching and to do at home Person educated: Patient Education method: Explanation Education comprehension: verbalized understanding and returned demonstration  HOME EXERCISE PROGRAM: To start back to stargazer and LTR to stretch pectorals  ASSESSMENT:  CLINICAL IMPRESSION: Significant tenderness/ tightness in bilateral UT, Left pectorals, lateral trunk and scapular region with twitch responses still noted. Cording improved but still noted and felt tight to pt today. AROM improved 11 degrees for flexion and 14 degrees for abduction. Pt would benefit from DN and suggested she think about it over the weekend..  OBJECTIVE IMPAIRMENTS: decreased activity tolerance, decreased knowledge of condition, decreased ROM, decreased strength, increased edema, impaired UE functional use, postural dysfunction, and pain.   ACTIVITY LIMITATIONS: carrying, sleeping, reach over head, and hygiene/grooming  PARTICIPATION LIMITATIONS:  pt is doing all but with some breast pain/discomfort, limitations in left shoulder ROM  PERSONAL FACTORS:  3+ comorbidities: Left breast cancer s/p infusions and radiation  are also affecting patient's functional outcome.    REHAB POTENTIAL: Excellent  CLINICAL DECISION MAKING: Stable/uncomplicated  EVALUATION COMPLEXITY: Low  GOALS: Goals reviewed with patient? Yes  SHORT TERM GOALS=LONG TERM GOALS: Target date: 12/02/2022    Pt will be independent in self MLD for reducing left breast edema Baseline: Goal status: INITIAL   2.  Pt will note decreased breast swelling/tenderness by atleast 50% Baseline:  Goal status: INITIAL   3.  Pt will be able to lie on her left  side to sleep for short periods of time Baseline:  Goal status: INITIAL   4.  Pt will have left shoulder ROM  flex and abd Within 5-10 degrees of right without increased pain  for improved reaching Baseline:R  flex 165, abd 180 Goal status: MET flexion 11/08/2023,  5.  Breast complaints survey will be reduced to no greater than 20 Baseline: 57 Goal status: INITIAL    PLAN:  PT FREQUENCY: 2x/week  PT DURATION: 6 weeks  PLANNED INTERVENTIONS: 97164- PT Re-evaluation, 97110-Therapeutic exercises, 97112- Neuromuscular re-education, 97535- Self Care, 16109- Manual therapy, 97760- Orthotic Fit/training, 97016- Vasopneumatic device, Patient/Family education, Manual lymph drainage, Scar mobilization, Therapeutic exercises, Therapeutic activity, Neuromuscular re-education, and Self Care, Dry needling.  PLAN FOR NEXT SESSION:Want to try DN?, STM left UQ,  MFR cording left,update HEP with stretches, PROM left, Left breast MLD,  Waynette Buttery, PT 11/08/2023, 10:53 AM

## 2023-11-12 ENCOUNTER — Ambulatory Visit: Payer: BC Managed Care – PPO

## 2023-11-12 ENCOUNTER — Encounter: Payer: Self-pay | Admitting: Adult Health

## 2023-11-12 ENCOUNTER — Encounter: Payer: Self-pay | Admitting: Hematology and Oncology

## 2023-11-12 DIAGNOSIS — Z483 Aftercare following surgery for neoplasm: Secondary | ICD-10-CM | POA: Diagnosis not present

## 2023-11-12 DIAGNOSIS — C50412 Malignant neoplasm of upper-outer quadrant of left female breast: Secondary | ICD-10-CM

## 2023-11-12 DIAGNOSIS — R293 Abnormal posture: Secondary | ICD-10-CM

## 2023-11-12 DIAGNOSIS — M25612 Stiffness of left shoulder, not elsewhere classified: Secondary | ICD-10-CM

## 2023-11-12 DIAGNOSIS — I890 Lymphedema, not elsewhere classified: Secondary | ICD-10-CM

## 2023-11-12 NOTE — Therapy (Signed)
OUTPATIENT PHYSICAL THERAPY  UPPER EXTREMITY ONCOLOGY EVALUATION  Patient Name: Caitlyn Torres MRN: [id number] DOB:Nov 07, 1961, 62 y.o., female Today's Date: 11/12/2023  END OF SESSION:  PT End of Session - 11/12/23 0811     Visit Number 6    Number of Visits 12    Date for PT Re-Evaluation 12/03/23    PT Start Time 0811    PT Stop Time 0856    PT Time Calculation (min) 45 min    Activity Tolerance Patient tolerated treatment well    Behavior During Therapy [facility] for tasks assessed/performed             Past Medical History:  Diagnosis Date   Anxiety    Arthritis    Cancer (HCC) 03/2022   L breast   Depression    Family history of breast cancer 05/15/2022   Family history of pancreatic cancer 05/15/2022   Family history of prostate cancer 05/15/2022   GERD (gastroesophageal reflux disease)    History of hiatal hernia    patient believes she may have been told she has this years ago   Past Surgical History:  Procedure Laterality Date   BREAST BIOPSY  09/19/2022   MM LT RADIOACTIVE SEED EA ADD LESION LOC MAMMO GUIDE 09/19/2022 [facility]   BREAST BIOPSY  09/19/2022   MM LT RADIOACTIVE SEED LOC MAMMO GUIDE 09/19/2022 [facility]   BREAST LUMPECTOMY WITH RADIOACTIVE SEED AND AXILLARY LYMPH NODE DISSECTION Left 09/20/2022   Procedure: LEFT BREAST SEED LUMPECTOMY, LEFT AXILLARY LYMPH NODE DISSECTION;  Surgeon: Harriette Bouillon, MD;  Location: [facility] OR;  Service: General;  Laterality: Left;  GEN & PEC BLOCK   CERVIX SURGERY     Laser removal of pre-cancer cells   LASIK Bilateral    PORTACATH PLACEMENT Right 04/20/2022   Procedure: PORT PLACEMENT;  Surgeon: Harriette Bouillon, MD;  Location: [facility] OR;  Service: General;  Laterality: Right;   Patient Active Problem List   Diagnosis Date Noted   Other fatigue 08/28/2022   Acute pain 08/28/2022   Genetic testing 06/01/2022   Family history of breast cancer 05/15/2022   Family history of pancreatic cancer 05/15/2022    Family history of prostate cancer 05/15/2022   Port-A-Cath in place 04/28/2022   Malignant neoplasm of upper-outer quadrant of left breast in female, estrogen receptor positive (HCC) 03/29/2022    PCP:   REFERRING PROVIDER: [name], MD   REFERRING DIAG: Left Breast lymphedema/cording  THERAPY DIAG:  Malignant neoplasm of upper-outer quadrant of left female breast, unspecified estrogen receptor status (HCC)  Aftercare following surgery for neoplasm  Lymphedema of breast  Stiffness of left shoulder, not elsewhere classified  Abnormal posture  ONSET DATE: March but exacerbated end of November  Rationale for Evaluation and Treatment: Rehabilitation  SUBJECTIVE:  SUBJECTIVE STATEMENT:  11/12/2023 I have a bit of a cold. Just sneezing a lot. Neck does better shert term but about half a day after being here pain returns.  EVAL I was using the Flexitouch 2 times per week and over time I noticed my breast swelling got worse. I have started doing some manual  lymphatic drainage as well and have been using the machine 4-5 times per week. It has gotten better. I have some cording issues too with some tightness. My breast has been sore at the top of my chest, and medial inferior side, and lateral side.  I have still been wearing the pad in my bra. My breast feels heavy and when I take my bra off it is painful.  I got some new compression bras before Thanksgiving, and one of them is the Wear Ease. I sleep in it every night. I got some cami's and sport bra's  PERTINENT HISTORY:  03/14/2022:Screening detected left breast masses 1.5 cm and 1.3 cm with enlarged left axillary lymph node, biopsy revealed grade 3 IDC with lymph node being positive, ER 95%, PR 0%, HER2 2+ by IHC and FISH positive with a ratio 2.45  and a copy #4.9, the second biopsy was HER2 negative   1. Neoadjuvant chemotherapy with Herceptin Perjeta and letrozole for 8 cycles.  (Our original recommendation is TCHP x6 cycles but patient is not willing to go through chemo) 2. left lumpectomy and ALND 09/20/2022: 0.6 cm IDC 2/14 lymph nodes, margins negative, ER 95%, PR 0%, HER2 positive 3. Followed by adjuvant radiation therapy  4.  Adjuvant antiestrogen therapy with neratinib She had left lumpectomy on 09/20/2022 with 2/14 LN's  PAIN:  Are you having pain? Yes NPRS scale: 4/10, left  medial breast pain mainly with pressure, neck is still hurting 4/10 Pain location: left breast, lateral, and left pectorals Pain orientation: Left and Upper  PAIN TYPE: aching, tight, and sore, heavy Pain description: constant  Aggravating factors: removing bra, movement, walking, jumping,sleeping on left Relieving factors: compression, MLD  PRECAUTIONS: Left UE lymphedema precautions   RED FLAGS: None   WEIGHT BEARING RESTRICTIONS: No  FALLS:  Has patient fallen in last 6 months? 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  COGNITION: Overall cognitive status: Within functional limits for tasks assessed   PALPATION: Tender medial, lateral and proximal left breast, left UT, lats, pectorals  OBSERVATIONS / OTHER ASSESSMENTS:generalized left breast swelling, mild fibrosis near lateral breast incision with tenderness, very few enlarged pores, cording noted left axillary region running below axillary incision to elbow region  SENSATION: Light touch: Deficits    POSTURE:  forward head, rounded shoulders    UPPER EXTREMITY AROM/PROM:  A/PROM RIGHT   eval   Shoulder extension 57  Shoulder flexion 165  Shoulder abduction 180  Shoulder internal rotation 65  Shoulder external rotation 97    (Blank rows = not tested)  A/PROM LEFT   eval LEFT 11/08/2023  Shoulder extension 50   Shoulder flexion 149 160  Shoulder abduction 154 168  Shoulder internal rotation 48  +   Shoulder external rotation 91     (Blank rows = not tested)  CERVICAL AROM: All within normal  limits:   UPPER EXTREMITY STRENGTH:   LYMPHEDEMA ASSESSMENTS:   SURGERY TYPE/DATE:  Left lumpectomy,09/20/2022   NUMBER OF LYMPH NODES REMOVED: 2+/14  CHEMOTHERAPY: No at her request, Did Herceptin, Perjeta and letrozole   RADIATION:Yes, but stopped early at her request  HORMONE TREATMENT: YES  INFECTIONS: NO   LYMPHEDEMA ASSESSMENTS:   LANDMARK RIGHT  eval  At axilla  25.2  15 cm proximal to olecranon process   10 cm proximal to olecranon process 22.7  Olecranon process 21.6  15 cm proximal to ulnar styloid process   10 cm proximal to ulnar styloid process 20.3  Just proximal to ulnar styloid process 14.2  Across hand at thumb web space 19.3  At base of 2nd digit 5.7  (Blank rows = not tested)  LANDMARK LEFT  eval  At axilla  25.2  15 cm proximal to olecranon process   10 cm proximal to olecranon process 22.2  Olecranon process 21.8  15 cm proximal to ulnar styloid process   10 cm proximal to ulnar styloid process 19.1  Just proximal to ulnar styloid process 14.0  Across hand at thumb web space 18.3  At base of 2nd digit 5.9  (Blank rows = not tested)   FUNCTIONAL TESTS:    GAIT: WNL   QUICK DASH SURVEY:   BREAST COMPLAINTS QUESTIONNAIRE Pain:9 Heaviness:8 Swollen feeling:9 Tense Skin:7 Redness:0 Bra Print:10 Size of Pores:8 Hard feeling: 6 Total:   57  /80 A Score over 9 indicates lymphedema issues in the breast   TODAY'S TREATMENT:                                                                                                                                           DATE:   11/12/2023 Cervical rotation x 5 B UT stretch x 3 B, Levator stretch x 3 B STM to left UT, Pectorals and lateral trunk and left scapular area with cocoa butter Continued MLD to medial breast only today;Short neck, 5 diaphragmatic breaths, R axillary nodes and establishment of interaxillary pathway,  then L medial breast moving fluid towards pathways ,,spending extra time in any areas of fibrosis then retracing all steps and ending with LN's. Snow angels x 5 Wall arc and prayer stretch with walk to right and at center x 3 11/08/2023 Ball rolls on wall x 10 forward and 5 abd Pec stretch on doorway x 3 Supine snow angel x 5, scaption x 5, horizontal abd x 5 MFR to left upper arm cording STM to bilateral UT,and lateral trunk and left scapular area with cocoa butter PROM left shoulder flexion, abduction, ER Measured AROM left shoulder 11/06/2023 STM to bilateral UT, left pectorals and lateral trunk and left scapular area with cocoa butter MFR to left upper arm cording Initiated left breast MLD to address breast heaviness In supine: Short neck, 5 diaphragmatic breaths, R axillary nodes and establishment  of interaxillary pathway, L inguinal nodes and establishment of axilloinguinal pathway, then L breast moving fluid towards pathways , and in SL to posterior interaxillary Anastomosis,spending extra time in any areas of fibrosis then retracing all steps and ending with LN's..  10/31/2023 STM to bilateral UT, left pectorals and lateral trunk and left scapular area with cocoa butter Initiated left breast MLD to address breast heaviness In supine: Short neck, 5 diaphragmatic breaths, R axillary nodes and establishment of interaxillary pathway, L inguinal nodes and establishment of axilloinguinal pathway, then L breast moving fluid towards pathways , and in SL to posterior interaxillary  Anastomosis,spending extra time in any areas of fibrosis then retracing all steps and ending with LN's. Small nodular area that is moveable noted in left inferior medial breast. Pt will be having an [place] in January and will get it checked.   10/30/2023 STM to bilateral UT, left pectorals and lateral trunk with cocoa butter Stargazer stretch x 5, supine wand flex x 3 held due to left shoulder pain, VC to depress scapula Supine snow angel x 5 MFR to left UE cording PROM left shoulder with MFR to left axillary region, flex, abduction, ER   10/22/2023 Discussed POC, treatment interventions. Practiced stargazer stretch and LTR with arms extended to start pectoral stretching for HEP   PATIENT EDUCATION:  Education details: Discussed POC, treatment interventions. Practiced stargazer stretch and LTR with arms extended to start pectoral stretching and to do at home Person educated: Patient Education method: Explanation Education comprehension: verbalized understanding and returned demonstration  HOME EXERCISE PROGRAM: To start back to stargazer and LTR to stretch pectorals  ASSESSMENT:  CLINICAL IMPRESSION: Fibrosis at medial breast softened after MLD. Continued tightness lats, pecs, traps. Pt advised and shown other stretches to perform at home.  OBJECTIVE IMPAIRMENTS: decreased activity tolerance, decreased knowledge of condition, decreased ROM, decreased strength, increased edema, impaired UE functional use, postural dysfunction, and pain.   ACTIVITY LIMITATIONS: carrying, sleeping, reach over head, and hygiene/grooming  PARTICIPATION LIMITATIONS:  pt is doing all but with some breast pain/discomfort, limitations in left shoulder ROM  PERSONAL FACTORS:  3+ comorbidities: Left breast cancer s/p infusions and radiation  are also affecting patient's functional outcome.    REHAB POTENTIAL: Excellent  CLINICAL DECISION MAKING: Stable/uncomplicated  EVALUATION COMPLEXITY:  Low  GOALS: Goals reviewed with patient? Yes  SHORT TERM GOALS=LONG TERM GOALS: Target date: 12/02/2022    Pt will be independent in self MLD for reducing left breast edema Baseline: Goal status: INITIAL   2.  Pt will note decreased breast swelling/tenderness by atleast 50% Baseline:  Goal status: INITIAL   3.  Pt will be able to lie on her left side to sleep for short periods of time Baseline:  Goal status: INITIAL   4.  Pt will have left shoulder ROM  flex and abd Within 5-10 degrees of right without increased pain  for improved reaching Baseline:R  flex 165, abd 180 Goal status: MET flexion 11/08/2023,  5.  Breast complaints survey will be reduced to no greater than 20 Baseline: 57 Goal status: INITIAL    PLAN:  PT FREQUENCY: 2x/week  PT DURATION: 6 weeks  PLANNED INTERVENTIONS: 97164- PT Re-evaluation, 97110-Therapeutic exercises, 97112- Neuromuscular re-education, 97535- Self Care, 16109- Manual therapy, 97760- Orthotic Fit/training, 97016- Vasopneumatic device, Patient/Family education, Manual lymph drainage, Scar mobilization, Therapeutic exercises, Therapeutic activity, Neuromuscular re-education, and Self Care, Dry needling.  PLAN FOR NEXT SESSION:Want to try DN?, STM left UQ,  MFR cording left,update HEP with  stretches, PROM left, Left breast MLD,  Waynette Buttery, PT 11/12/2023, 9:00 AM

## 2023-11-19 ENCOUNTER — Encounter: Payer: Self-pay | Admitting: Adult Health

## 2023-11-19 ENCOUNTER — Ambulatory Visit: Payer: 59 | Attending: Hematology and Oncology

## 2023-11-19 ENCOUNTER — Encounter: Payer: Self-pay | Admitting: Hematology and Oncology

## 2023-11-19 DIAGNOSIS — C50412 Malignant neoplasm of upper-outer quadrant of left female breast: Secondary | ICD-10-CM | POA: Diagnosis present

## 2023-11-19 DIAGNOSIS — I890 Lymphedema, not elsewhere classified: Secondary | ICD-10-CM

## 2023-11-19 DIAGNOSIS — Z483 Aftercare following surgery for neoplasm: Secondary | ICD-10-CM | POA: Diagnosis present

## 2023-11-19 DIAGNOSIS — M25612 Stiffness of left shoulder, not elsewhere classified: Secondary | ICD-10-CM

## 2023-11-19 DIAGNOSIS — R252 Cramp and spasm: Secondary | ICD-10-CM | POA: Insufficient documentation

## 2023-11-19 DIAGNOSIS — R293 Abnormal posture: Secondary | ICD-10-CM
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
## 2023-11-21 ENCOUNTER — Ambulatory Visit: Payer: 59 | Admitting: Physical Therapy

## 2023-11-21 DIAGNOSIS — Z483 Aftercare following surgery for neoplasm: Secondary | ICD-10-CM

## 2023-11-21 DIAGNOSIS — C50412 Malignant neoplasm of upper-outer quadrant of left female breast: Secondary | ICD-10-CM | POA: Diagnosis not present

## 2023-11-21 DIAGNOSIS — R252 Cramp and spasm: Secondary | ICD-10-CM

## 2023-11-21 DIAGNOSIS — M25612 Stiffness of left shoulder, not elsewhere classified: Secondary | ICD-10-CM

## 2023-11-21 NOTE — Therapy (Signed)
 OUTPATIENT PHYSICAL THERAPY  UPPER EXTREMITY ONCOLOGY PROGRESS NOTE  Patient Name: Caitlyn Torres MRN: [id number] DOB:[birth year], 63 y.o., female Today's Date: 11/21/2023  END OF SESSION:  PT End of Session - 11/21/23 0806     Visit Number 8    Number of Visits 12    Date for PT Re-Evaluation 12/03/23    PT Start Time 0806    PT Stop Time 0844    PT Time Calculation (min) 38 min    Activity Tolerance Patient tolerated treatment well             Past Medical History:  Diagnosis Date   Anxiety    Arthritis    Cancer (HCC) 03/2022   L breast   Depression    Family history of breast cancer 05/15/2022   Family history of pancreatic cancer 05/15/2022   Family history of prostate cancer 05/15/2022   GERD (gastroesophageal reflux disease)    History of hiatal hernia    patient believes she may have been told she has this years ago   Past Surgical History:  Procedure Laterality Date   BREAST BIOPSY  09/19/2022   MM LT RADIOACTIVE SEED EA ADD LESION LOC MAMMO GUIDE 09/19/2022 [facility]   BREAST BIOPSY  09/19/2022   MM LT RADIOACTIVE SEED LOC MAMMO GUIDE 09/19/2022 [facility]   BREAST LUMPECTOMY WITH RADIOACTIVE SEED AND AXILLARY LYMPH NODE DISSECTION Left 09/20/2022   Procedure: LEFT BREAST SEED LUMPECTOMY, LEFT AXILLARY LYMPH NODE DISSECTION;  Surgeon: Vanderbilt Ned, MD;  Location: [facility] OR;  Service: General;  Laterality: Left;  GEN & PEC BLOCK   CERVIX SURGERY     Laser removal of pre-cancer cells   LASIK Bilateral    PORTACATH PLACEMENT Right 04/20/2022   Procedure: PORT PLACEMENT;  Surgeon: Vanderbilt Ned, MD;  Location: [facility] OR;  Service: General;  Laterality: Right;   Patient Active Problem List   Diagnosis Date Noted   Other fatigue 08/28/2022   Acute pain 08/28/2022   Genetic testing 06/01/2022   Family history of breast cancer 05/15/2022   Family history of pancreatic cancer 05/15/2022   Family history of prostate cancer 05/15/2022   Port-A-Cath  in place 04/28/2022   Malignant neoplasm of upper-outer quadrant of left breast in female, estrogen receptor positive (HCC) 03/29/2022    PCP:   REFERRING PROVIDER: Odean Potts, MD   REFERRING DIAG: Left Breast lymphedema/cording  THERAPY DIAG:  Malignant neoplasm of upper-outer quadrant of left female breast, unspecified estrogen receptor status (HCC)  Aftercare following surgery for neoplasm  Cramp and spasm  Stiffness of left shoulder, not elsewhere classified  ONSET DATE: March but exacerbated end of November  Rationale for Evaluation and Treatment: Rehabilitation  SUBJECTIVE:  SUBJECTIVE STATEMENT:  Had DN in past 1x, relieved problem for a while. Bil upper traps and left pectoral area has the most tightness.   EVAL I was using the Flexitouch 2 times per week and over time I noticed my breast swelling got worse. I have started doing some manual  lymphatic drainage as well and have been using the machine 4-5 times per week. It has gotten better. I have some cording issues too with some tightness. My breast has been sore at the top of my chest, and medial inferior side, and lateral side.  I have still been wearing the pad in my bra. My breast feels heavy and when I take my bra off it is painful.  I got some new compression bras before Thanksgiving, and one of them is the Wear Ease. I sleep in it every night. I got some cami's and sport bra's  PERTINENT HISTORY:  03/14/2022:Screening detected left breast masses 1.5 cm and 1.3 cm with enlarged left axillary lymph node, biopsy revealed grade 3 IDC with lymph node being positive, ER 95%, PR 0%, HER2 2+ by IHC and FISH positive with a ratio 2.45 and a copy #4.9, the second biopsy was HER2 negative   1. Neoadjuvant chemotherapy with Herceptin  Perjeta  and  letrozole  for 8 cycles.  (Our original recommendation is TCHP x6 cycles but patient is not willing to go through chemo) 2. left lumpectomy and ALND 09/20/2022: 0.6 cm IDC 2/14 lymph nodes, margins negative, ER 95%, PR 0%, HER2 positive 3. Followed by adjuvant radiation therapy  4.  Adjuvant antiestrogen therapy with neratinib She had left lumpectomy on 09/20/2022 with 2/14 LN's  PAIN:  Are you having pain? Yes NPRS scale: 4/10, left  medial breast pain mainly with pressure, neck is still hurting 4/10 Pain location: left breast, lateral, and left pectorals Pain orientation: Left and Upper  PAIN TYPE: aching, tight, and sore, heavy Pain description: constant  Aggravating factors: removing bra, movement, walking, jumping,sleeping on left Relieving factors: compression, MLD  PRECAUTIONS: Left UE lymphedema precautions   RED FLAGS: None   WEIGHT BEARING RESTRICTIONS: No  FALLS:  Has patient fallen in last 6 months? No  LIVING ENVIRONMENT: Lives with: partner [name] Lives in: House/apartment Has following equipment at home: None  OCCUPATION: speech pathologist;works for [employer]   LEISURE: walking, out to eat  HAND DOMINANCE: right   PRIOR LEVEL OF FUNCTION: Independent  PATIENT GOALS:    OBJECTIVE: Note: Objective measures were completed at Evaluation unless otherwise noted.  COGNITION: Overall cognitive status: Within functional limits for tasks assessed   PALPATION: Tender medial, lateral and proximal left breast, left UT, lats, pectorals  OBSERVATIONS / OTHER ASSESSMENTS:generalized left breast swelling, mild fibrosis near lateral breast incision with tenderness, very few enlarged pores, cording noted left axillary region running below axillary incision to elbow region  SENSATION: Light touch: Deficits    POSTURE:  forward head, rounded shoulders   UPPER EXTREMITY AROM/PROM:  A/PROM RIGHT   eval   Shoulder extension 57  Shoulder flexion 165   Shoulder abduction 180  Shoulder internal rotation 65  Shoulder external rotation 97    (Blank rows = not tested)  A/PROM LEFT   eval LEFT 11/08/2023  Shoulder extension 50   Shoulder flexion 149 160  Shoulder abduction 154 168  Shoulder internal rotation 48  +   Shoulder external rotation 91     (Blank rows = not tested)  CERVICAL AROM: All within normal limits:   UPPER EXTREMITY  STRENGTH:   LYMPHEDEMA ASSESSMENTS:   SURGERY TYPE/DATE:  Left lumpectomy,09/20/2022   NUMBER OF LYMPH NODES REMOVED: 2+/14  CHEMOTHERAPY: No at her request, Did Herceptin , Perjeta  and letrozole    RADIATION:Yes, but stopped early at her request  HORMONE TREATMENT: YES  INFECTIONS: NO   LYMPHEDEMA ASSESSMENTS:   LANDMARK RIGHT  eval  At axilla  25.2  15 cm proximal to olecranon process   10 cm proximal to olecranon process 22.7  Olecranon process 21.6  15 cm proximal to ulnar styloid process   10 cm proximal to ulnar styloid process 20.3  Just proximal to ulnar styloid process 14.2  Across hand at thumb web space 19.3  At base of 2nd digit 5.7  (Blank rows = not tested)  LANDMARK LEFT  eval  At axilla  25.2  15 cm proximal to olecranon process   10 cm proximal to olecranon process 22.2  Olecranon process 21.8  15 cm proximal to ulnar styloid process   10 cm proximal to ulnar styloid process 19.1  Just proximal to ulnar styloid process 14.0  Across hand at thumb web space 18.3  At base of 2nd digit 5.9  (Blank rows = not tested)   FUNCTIONAL TESTS:    GAIT: WNL   QUICK DASH SURVEY:   BREAST COMPLAINTS QUESTIONNAIRE Pain:9 Heaviness:8 Swollen feeling:9 Tense Skin:7 Redness:0 Bra Print:10 Size of Pores:8 Hard feeling: 6 Total:   57  /80 A Score over 9 indicates lymphedema issues in the breast   TODAY'S TREATMENT:                                                                                                                                          DATE:   11/21/23: Manual therapy: soft tissue mobilization to cervical and left shoulder musculature Trigger Point Dry Needling Initial Treatment: Pt instructed on Dry Needling rational, procedures, and possible side effects. Pt instructed to expect mild to moderate muscle soreness later in the day and/or into the next day.  Pt instructed in methods to reduce muscle soreness. Pt instructed to continue prescribed HEP. Because Dry Needling was performed over or adjacent to a lung field, pt was educated on S/S of pneumothorax and to seek immediate medical attention should they occur.  Patient was educated on signs and symptoms of infection and other risk factors and advised to seek medical attention should they occur.  Patient verbalized understanding of these instructions and education.   Patient Verbal Consent Given: Yes Education Handout Provided: Yes Muscles Treated: bil upper traps, bil levator scap, bil cervical multifidi marination, left pectorals, left teres Electrical Stimulation Performed: No Treatment Response/Outcome: soreness right > left initially; pt reports she can feel it release in cervical musculature during marination; improved soft tissue mobility and decreased tender point size and number    11/18/2022 STM to bilateral UT with emphasis on left UT and pectorals using cocoabutter;limited  due to late and wanting to spend more time on MLD Scar mobilization to left breast incision left breast MLD to address breast heaviness In supine: Short neck, 5 diaphragmatic breaths, R axillary nodes and establishment of interaxillary pathway, L inguinal nodes and establishment of axilloinguinal pathway, then L breast moving fluid towards pathways , and in SL to posterior interaxillary Anastomosis,spending extra time in any areas of fibrosis then retracing all steps and ending with LN's. Discussed DN to UT/pectorals. Pt willing to try and added to schedule today 11/12/2023 Cervical rotation x 5  B UT stretch x 3 B, Levator stretch x 3 B STM to left UT, Pectorals and lateral trunk and left scapular area with cocoa butter Continued MLD to medial breast only today;Short neck, 5 diaphragmatic breaths, R axillary nodes and establishment of interaxillary pathway,  then L medial breast moving fluid towards pathways ,,spending extra time in any areas of fibrosis then retracing all steps and ending with LN's. Snow angels x 5 Wall arc and prayer stretch with walk to right and at center x 3 11/08/2023 Ball rolls on wall x 10 forward and 5 abd Pec stretch on doorway x 3 Supine snow angel x 5, scaption x 5, horizontal abd x 5 MFR to left upper arm cording STM to bilateral UT,and lateral trunk and left scapular area with cocoa butter PROM left shoulder flexion, abduction, ER Measured AROM left shoulder 11/06/2023 STM to bilateral UT, left pectorals and lateral trunk and left scapular area with cocoa butter MFR to left upper arm cording Initiated left breast MLD to address breast heaviness In supine: Short neck, 5 diaphragmatic breaths, R axillary nodes and establishment of interaxillary pathway, L inguinal nodes and establishment of axilloinguinal pathway, then L breast moving fluid towards pathways , and in SL to posterior interaxillary Anastomosis,spending extra time in any areas of fibrosis then retracing all steps and ending with LN's..  10/31/2023 STM to bilateral UT, left pectorals and lateral trunk and left scapular area with cocoa butter Initiated left breast MLD to address breast heaviness In supine: Short neck, 5 diaphragmatic breaths, R axillary nodes and establishment of interaxillary pathway, L inguinal nodes and establishment of axilloinguinal pathway, then L breast moving fluid towards pathways , and in SL to posterior interaxillary Anastomosis,spending extra time in any areas of fibrosis then retracing all steps and ending with LN's. Small nodular area that is moveable noted in left  inferior medial breast. Pt will be having an US  in January and will get it checked.   10/30/2023 STM to bilateral UT, left pectorals and lateral trunk with cocoa butter Stargazer stretch x 5, supine wand flex x 3 held due to left shoulder pain, VC to depress scapula Supine snow angel x 5 MFR to left UE cording PROM left shoulder with MFR to left axillary region, flex, abduction, ER   10/22/2023 Discussed POC, treatment interventions. Practiced stargazer stretch and LTR with arms extended to start pectoral stretching for HEP   PATIENT EDUCATION:  Education details: Discussed POC, treatment interventions. Practiced stargazer stretch and LTR with arms extended to start pectoral stretching and to do at home Person educated: Patient Education method: Explanation Education comprehension: verbalized understanding and returned demonstration  HOME EXERCISE PROGRAM: To start back to stargazer and LTR to stretch pectorals  ASSESSMENT:  CLINICAL IMPRESSION: The patient had a positive initial response to DN with much improved soft tissue mobility and decreased size and number of tender points.  Therapist monitoring response and educating patient on what  to expect following DN and how to optimize benefit with specific exercise particularly levator scap muscles.  Anticipate pain intensity and mobility will continue to improve over the next few days.     OBJECTIVE IMPAIRMENTS: decreased activity tolerance, decreased knowledge of condition, decreased ROM, decreased strength, increased edema, impaired UE functional use, postural dysfunction, and pain.   ACTIVITY LIMITATIONS: carrying, sleeping, reach over head, and hygiene/grooming  PARTICIPATION LIMITATIONS:  pt is doing all but with some breast pain/discomfort, limitations in left shoulder ROM  PERSONAL FACTORS:  3+ comorbidities: Left breast cancer s/p infusions and radiation  are also affecting patient's functional outcome.    REHAB POTENTIAL:  Excellent  CLINICAL DECISION MAKING: Stable/uncomplicated  EVALUATION COMPLEXITY: Low  GOALS: Goals reviewed with patient? Yes  SHORT TERM GOALS=LONG TERM GOALS: Target date: 12/02/2022    Pt will be independent in self MLD for reducing left breast edema Baseline: Goal status: INITIAL   2.  Pt will note decreased breast swelling/tenderness by atleast 50% Baseline:  Goal status: INITIAL   3.  Pt will be able to lie on her left side to sleep for short periods of time Baseline:  Goal status: INITIAL   4.  Pt will have left shoulder ROM  flex and abd Within 5-10 degrees of right without increased pain  for improved reaching Baseline:R  flex 165, abd 180 Goal status: MET flexion 11/08/2023,  5.  Breast complaints survey will be reduced to no greater than 20 Baseline: 57 Goal status: INITIAL    PLAN:  PT FREQUENCY: 2x/week  PT DURATION: 6 weeks  PLANNED INTERVENTIONS: 97164- PT Re-evaluation, 97110-Therapeutic exercises, 97112- Neuromuscular re-education, 97535- Self Care, 02859- Manual therapy, 97760- Orthotic Fit/training, 97016- Vasopneumatic device, Patient/Family education, Manual lymph drainage, Scar mobilization, Therapeutic exercises, Therapeutic activity, Neuromuscular re-education, and Self Care, Dry needling.  PLAN FOR NEXT SESSION:assess response to DN, STM left UQ,  MFR cording left,update HEP with stretches, PROM left, Left breast MLD, Glade Pesa, PT 11/21/23 8:50 AM Phone: [phone] Fax: [phone]

## 2023-11-21 NOTE — Patient Instructions (Signed)

## 2023-11-26 ENCOUNTER — Ambulatory Visit: Payer: 59

## 2023-11-26 DIAGNOSIS — R252 Cramp and spasm: Secondary | ICD-10-CM

## 2023-11-26 DIAGNOSIS — M25612 Stiffness of left shoulder, not elsewhere classified: Secondary | ICD-10-CM

## 2023-11-26 DIAGNOSIS — Z483 Aftercare following surgery for neoplasm: Secondary | ICD-10-CM

## 2023-11-26 DIAGNOSIS — C50412 Malignant neoplasm of upper-outer quadrant of left female breast: Secondary | ICD-10-CM | POA: Diagnosis not present

## 2023-11-26 DIAGNOSIS — I890 Lymphedema, not elsewhere classified: Secondary | ICD-10-CM

## 2023-11-26 NOTE — Therapy (Signed)
 OUTPATIENT PHYSICAL THERAPY  UPPER EXTREMITY ONCOLOGY PROGRESS NOTE  Patient Name: Caitlyn Torres MRN: [id number] DOB:[birth year], 63 y.o., female Today's Date: 11/26/2023  END OF SESSION:  PT End of Session - 11/26/23 0906     Visit Number 9    Number of Visits 12    Date for PT Re-Evaluation 12/03/23    PT Start Time 0906    PT Stop Time 0950    PT Time Calculation (min) 44 min    Activity Tolerance Patient tolerated treatment well    Behavior During Therapy [facility] for tasks assessed/performed             Past Medical History:  Diagnosis Date   Anxiety    Arthritis    Cancer (HCC) 03/2022   L breast   Depression    Family history of breast cancer 05/15/2022   Family history of pancreatic cancer 05/15/2022   Family history of prostate cancer 05/15/2022   GERD (gastroesophageal reflux disease)    History of hiatal hernia    patient believes she may have been told she has this years ago   Past Surgical History:  Procedure Laterality Date   BREAST BIOPSY  09/19/2022   MM LT RADIOACTIVE SEED EA ADD LESION LOC MAMMO GUIDE 09/19/2022 [facility]   BREAST BIOPSY  09/19/2022   MM LT RADIOACTIVE SEED LOC MAMMO GUIDE 09/19/2022 [facility]   BREAST LUMPECTOMY WITH RADIOACTIVE SEED AND AXILLARY LYMPH NODE DISSECTION Left 09/20/2022   Procedure: LEFT BREAST SEED LUMPECTOMY, LEFT AXILLARY LYMPH NODE DISSECTION;  Surgeon: Caitlyn Ned, MD;  Location: [facility] OR;  Service: General;  Laterality: Left;  GEN & PEC BLOCK   CERVIX SURGERY     Laser removal of pre-cancer cells   LASIK Bilateral    PORTACATH PLACEMENT Right 04/20/2022   Procedure: PORT PLACEMENT;  Surgeon: Caitlyn Ned, MD;  Location: [facility] OR;  Service: General;  Laterality: Right;   Patient Active Problem List   Diagnosis Date Noted   Other fatigue 08/28/2022   Acute pain 08/28/2022   Genetic testing 06/01/2022   Family history of breast cancer 05/15/2022   Family history of pancreatic cancer 05/15/2022    Family history of prostate cancer 05/15/2022   Port-A-Cath in place 04/28/2022   Malignant neoplasm of upper-outer quadrant of left breast in female, estrogen receptor positive (HCC) 03/29/2022    PCP:   REFERRING PROVIDER: Odean Potts, MD   REFERRING DIAG: Left Breast lymphedema/cording  THERAPY DIAG:  Malignant neoplasm of upper-outer quadrant of left female breast, unspecified estrogen receptor status (HCC)  Aftercare following surgery for neoplasm  Cramp and spasm  Stiffness of left shoulder, not elsewhere classified  Lymphedema of breast  ONSET DATE: March but exacerbated end of November  Rationale for Evaluation and Treatment: Rehabilitation  SUBJECTIVE:  SUBJECTIVE STATEMENT:  I was sore that day and the next but she did a lot.  I think I am looser, but it still feels tight. I did some yoga this wekend to try and stretch and relax.  I find myself really tense, clenching my teeth etc.. I wore my compression bra all weekend so it doesn't hurt much. I have done my Flexi touch   EVAL I was using the Flexitouch 2 times per week and over time I noticed my breast swelling got worse. I have started doing some manual  lymphatic drainage as well and have been using the machine 4-5 times per week. It has gotten better. I have some cording issues too with some tightness. My breast has been sore at the top of my chest, and medial inferior side, and lateral side.  I have still been wearing the pad in my bra. My breast feels heavy and when I take my bra off it is painful.  I got some new compression bras before Thanksgiving, and one of them is the Wear Ease. I sleep in it every night. I got some cami's and sport bra's  PERTINENT HISTORY:  03/14/2022:Screening detected left breast masses 1.5 cm and 1.3 cm  with enlarged left axillary lymph node, biopsy revealed grade 3 IDC with lymph node being positive, ER 95%, PR 0%, HER2 2+ by IHC and FISH positive with a ratio 2.45 and a copy #4.9, the second biopsy was HER2 negative   1. Neoadjuvant chemotherapy with Herceptin  Perjeta  and letrozole  for 8 cycles.  (Our original recommendation is TCHP x6 cycles but patient is not willing to go through chemo) 2. left lumpectomy and ALND 09/20/2022: 0.6 cm IDC 2/14 lymph nodes, margins negative, ER 95%, PR 0%, HER2 positive 3. Followed by adjuvant radiation therapy  4.  Adjuvant antiestrogen therapy with neratinib She had left lumpectomy on 09/20/2022 with 2/14 LN's  PAIN:  Are you having pain? Yes NPRS scale: 3-4/10, left  medial breast pain mainly with pressure, neck is still hurting 3- 4/10 Pain location: left breast, lateral, and left pectorals Pain orientation: Left and Upper  PAIN TYPE: aching, tight, and sore, heavy Pain description: constant  Aggravating factors: removing bra, movement, walking, jumping,sleeping on left Relieving factors: compression, MLD  PRECAUTIONS: Left UE lymphedema precautions   RED FLAGS: None   WEIGHT BEARING RESTRICTIONS: No  FALLS:  Has patient fallen in last 6 months? 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  COGNITION: Overall cognitive status: Within functional limits for tasks assessed   PALPATION: Tender medial, lateral and proximal left breast, left UT, lats, pectorals  OBSERVATIONS / OTHER ASSESSMENTS:generalized left breast swelling, mild fibrosis near lateral breast incision with tenderness, very few enlarged  pores, cording noted left axillary region running below axillary incision to elbow region  SENSATION: Light touch: Deficits    POSTURE:  forward head, rounded shoulders   UPPER EXTREMITY AROM/PROM:  A/PROM RIGHT   eval   Shoulder extension 57  Shoulder flexion 165  Shoulder abduction 180  Shoulder internal rotation 65  Shoulder external rotation 97    (Blank rows = not tested)  A/PROM LEFT   eval LEFT 11/08/2023  Shoulder extension 50  Shoulder flexion 149 160  Shoulder abduction 154 168  Shoulder internal rotation 48  +   Shoulder external rotation 91     (Blank rows = not tested)  CERVICAL AROM: All within normal limits:   UPPER EXTREMITY STRENGTH:   LYMPHEDEMA ASSESSMENTS:   SURGERY TYPE/DATE:  Left lumpectomy,09/20/2022   NUMBER OF LYMPH NODES REMOVED: 2+/14  CHEMOTHERAPY: No at her request, Did Herceptin , Perjeta  and letrozole    RADIATION:Yes, but stopped early at her request  HORMONE TREATMENT: YES  INFECTIONS: NO   LYMPHEDEMA ASSESSMENTS:   LANDMARK RIGHT  eval  At axilla  25.2  15 cm proximal to olecranon process   10 cm proximal to olecranon process 22.7  Olecranon process 21.6  15 cm proximal to ulnar styloid process   10 cm proximal to ulnar styloid process 20.3  Just proximal to ulnar styloid process 14.2  Across hand at thumb web space 19.3  At base of 2nd digit 5.7  (Blank rows = not tested)  LANDMARK LEFT  eval  At axilla  25.2  15 cm proximal to olecranon process   10 cm proximal to olecranon process 22.2  Olecranon process 21.8  15 cm proximal to ulnar styloid process   10 cm proximal to ulnar styloid process 19.1  Just proximal to ulnar styloid process 14.0  Across hand at thumb web space 18.3  At base of 2nd digit 5.9  (Blank rows = not tested)   FUNCTIONAL TESTS:    GAIT: WNL   QUICK DASH SURVEY:   BREAST COMPLAINTS QUESTIONNAIRE Pain:9 Heaviness:8 Swollen feeling:9 Tense Skin:7 Redness:0 Bra  Print:10 Size of Pores:8 Hard feeling: 6 Total:   57  /80 A Score over 9 indicates lymphedema issues in the breast   TODAY'S TREATMENT:                                                                                                                                          DATE:   11/26/2023 Prayer stretch x 3, prayer stretch with  quadruped hand walk to right to stretch lateral trunk x 3 Single arm wall stretch for pectorals x 3 B Supine on half foam roll 3 D flexion, scaption, horizontal abd STM to bilateral UT,and lateral trunk and left scapular area with cocoa butter Scar massage to lumpectomy incision followed by MLD MLD to lateral breast only today;Short neck, 5 diaphragmatic breaths, Left inguinal nodes and establishment of axillo-inguinal pathway,  then L lateral  breast moving fluid towards pathways ,,spending extra time in any areas of fibrosis then retracing all steps and ending with LN's. 11/21/23: Manual therapy: soft tissue mobilization to cervical and left shoulder musculature Trigger Point Dry Needling Initial Treatment: Pt instructed on Dry Needling rational, procedures, and possible side effects. Pt instructed to expect mild to moderate muscle soreness later in the day and/or into the next day.  Pt instructed in methods  to reduce muscle soreness. Pt instructed to continue prescribed HEP. Because Dry Needling was performed over or adjacent to a lung field, pt was educated on S/S of pneumothorax and to seek immediate medical attention should they occur.  Patient was educated on signs and symptoms of infection and other risk factors and advised to seek medical attention should they occur.  Patient verbalized understanding of these instructions and education.   Patient Verbal Consent Given: Yes Education Handout Provided: Yes Muscles Treated: bil upper traps, bil levator scap, bil cervical multifidi marination, left pectorals, left teres Electrical Stimulation Performed:  No Treatment Response/Outcome: soreness right > left initially; pt reports she can feel it release in cervical musculature during marination; improved soft tissue mobility and decreased tender point size and number    11/18/2022 STM to bilateral UT with emphasis on left UT and pectorals using cocoabutter;limited due to late and wanting to spend more time on MLD Scar mobilization to left breast incision left breast MLD to address breast heaviness In supine: Short neck, 5 diaphragmatic breaths, R axillary nodes and establishment of interaxillary pathway, L inguinal nodes and establishment of axilloinguinal pathway, then L breast moving fluid towards pathways , and in SL to posterior interaxillary Anastomosis,spending extra time in any areas of fibrosis then retracing all steps and ending with LN's. Discussed DN to UT/pectorals. Pt willing to try and added to schedule today 11/12/2023 Cervical rotation x 5 B UT stretch x 3 B, Levator stretch x 3 B STM to left UT, Pectorals and lateral trunk and left scapular area with cocoa butter Continued MLD to medial breast only today;Short neck, 5 diaphragmatic breaths, R axillary nodes and establishment of interaxillary pathway,  then L medial breast moving fluid towards pathways ,,spending extra time in any areas of fibrosis then retracing all steps and ending with LN's. Snow angels x 5 Wall arc and prayer stretch with walk to right and at center x 3 11/08/2023 Ball rolls on wall x 10 forward and 5 abd Pec stretch on doorway x 3 Supine snow angel x 5, scaption x 5, horizontal abd x 5 MFR to left upper arm cording STM to bilateral UT,and lateral trunk and left scapular area with cocoa butter PROM left shoulder flexion, abduction, ER Measured AROM left shoulder 11/06/2023 STM to bilateral UT, left pectorals and lateral trunk and left scapular area with cocoa butter MFR to left upper arm cording Initiated left breast MLD to address breast heaviness In  supine: Short neck, 5 diaphragmatic breaths, R axillary nodes and establishment of interaxillary pathway, L inguinal nodes and establishment of axilloinguinal pathway, then L breast moving fluid towards pathways , and in SL to posterior interaxillary Anastomosis,spending extra time in any areas of fibrosis then retracing all steps and ending with LN's..  10/31/2023 STM to bilateral UT, left pectorals and lateral trunk and left scapular area with cocoa butter Initiated left breast MLD to address breast heaviness In supine: Short neck, 5 diaphragmatic breaths, R axillary nodes and establishment of interaxillary pathway, L inguinal nodes and establishment of axilloinguinal pathway, then L breast moving fluid towards pathways , and in SL to posterior interaxillary Anastomosis,spending extra time in any areas of fibrosis then retracing all steps and ending with LN's. Small nodular area that is moveable noted in left inferior medial breast. Pt will be having an US  in January and will get it checked.   10/30/2023 STM to bilateral UT, left pectorals and lateral trunk with cocoa butter Stargazer stretch x 5, supine wand  flex x 3 held due to left shoulder pain, VC to depress scapula Supine snow angel x 5 MFR to left UE cording PROM left shoulder with MFR to left axillary region, flex, abduction, ER   10/22/2023 Discussed POC, treatment interventions. Practiced stargazer stretch and LTR with arms extended to start pectoral stretching for HEP   PATIENT EDUCATION:  Education details: Discussed POC, treatment interventions. Practiced stargazer stretch and LTR with arms extended to start pectoral stretching and to do at home Person educated: Patient Education method: Explanation Education comprehension: verbalized understanding and returned demonstration  HOME EXERCISE PROGRAM: To start back to stargazer and LTR to stretch pectorals  ASSESSMENT:  CLINICAL IMPRESSION:   Left UT much looser today  after DN session. Still tight in left pectorals and lateral trunk with multiple shortened areas noted. Breast swelling doing well with only mild fibrosis around incision on breast.  OBJECTIVE IMPAIRMENTS: decreased activity tolerance, decreased knowledge of condition, decreased ROM, decreased strength, increased edema, impaired UE functional use, postural dysfunction, and pain.   ACTIVITY LIMITATIONS: carrying, sleeping, reach over head, and hygiene/grooming  PARTICIPATION LIMITATIONS:  pt is doing all but with some breast pain/discomfort, limitations in left shoulder ROM  PERSONAL FACTORS:  3+ comorbidities: Left breast cancer s/p infusions and radiation  are also affecting patient's functional outcome.    REHAB POTENTIAL: Excellent  CLINICAL DECISION MAKING: Stable/uncomplicated  EVALUATION COMPLEXITY: Low  GOALS: Goals reviewed with patient? Yes  SHORT TERM GOALS=LONG TERM GOALS: Target date: 12/02/2022    Pt will be independent in self MLD for reducing left breast edema Baseline: Goal status: INITIAL   2.  Pt will note decreased breast swelling/tenderness by atleast 50% Baseline:  Goal status: INITIAL   3.  Pt will be able to lie on her left side to sleep for short periods of time Baseline:  Goal status: INITIAL   4.  Pt will have left shoulder ROM  flex and abd Within 5-10 degrees of right without increased pain  for improved reaching Baseline:R  flex 165, abd 180 Goal status: MET flexion 11/08/2023,  5.  Breast complaints survey will be reduced to no greater than 20 Baseline: 57 Goal status: INITIAL    PLAN:  PT FREQUENCY: 2x/week  PT DURATION: 6 weeks  PLANNED INTERVENTIONS: 97164- PT Re-evaluation, 97110-Therapeutic exercises, 97112- Neuromuscular re-education, 97535- Self Care, [id number]- Manual therapy, 97760- Orthotic Fit/training, 97016- Vasopneumatic device, Patient/Family education, Manual lymph drainage, Scar mobilization, Therapeutic exercises, Therapeutic  activity, Neuromuscular re-education, and Self Care, Dry needling.  PLAN FOR NEXT SESSION:assess response to DN, STM left UQ,  MFR cording left,update HEP with stretches, PROM left, Left breast MLD, Grayce Sheldon, PT 11/26/23 9:52 AM Phone: [phone] Fax: [phone]

## 2023-11-28 ENCOUNTER — Ambulatory Visit: Payer: 59 | Admitting: Physical Therapy

## 2023-11-28 DIAGNOSIS — C50412 Malignant neoplasm of upper-outer quadrant of left female breast: Secondary | ICD-10-CM

## 2023-11-28 DIAGNOSIS — Z483 Aftercare following surgery for neoplasm: Secondary | ICD-10-CM

## 2023-11-28 DIAGNOSIS — R252 Cramp and spasm: Secondary | ICD-10-CM

## 2023-11-28 NOTE — Therapy (Signed)
 OUTPATIENT PHYSICAL THERAPY  UPPER EXTREMITY ONCOLOGY PROGRESS NOTE  Patient Name: Caitlyn Torres MRN: [id number] DOB:[birth year], 63 y.o., female Today's Date: 11/28/2023  END OF SESSION:  PT End of Session - 11/28/23 1150     Visit Number 10    Date for PT Re-Evaluation 12/03/23    Authorization Type [employer]    PT Start Time 1149    PT Stop Time 1230    PT Time Calculation (min) 41 min    Activity Tolerance Patient tolerated treatment well             Past Medical History:  Diagnosis Date   Anxiety    Arthritis    Cancer (HCC) 03/2022   L breast   Depression    Family history of breast cancer 05/15/2022   Family history of pancreatic cancer 05/15/2022   Family history of prostate cancer 05/15/2022   GERD (gastroesophageal reflux disease)    History of hiatal hernia    patient believes she may have been told she has this years ago   Past Surgical History:  Procedure Laterality Date   BREAST BIOPSY  09/19/2022   MM LT RADIOACTIVE SEED EA ADD LESION LOC MAMMO GUIDE 09/19/2022 [facility]   BREAST BIOPSY  09/19/2022   MM LT RADIOACTIVE SEED LOC MAMMO GUIDE 09/19/2022 [facility]   BREAST LUMPECTOMY WITH RADIOACTIVE SEED AND AXILLARY LYMPH NODE DISSECTION Left 09/20/2022   Procedure: LEFT BREAST SEED LUMPECTOMY, LEFT AXILLARY LYMPH NODE DISSECTION;  Surgeon: Sim Dryer, MD;  Location: [facility] OR;  Service: General;  Laterality: Left;  GEN & PEC BLOCK   CERVIX SURGERY     Laser removal of pre-cancer cells   LASIK Bilateral    PORTACATH PLACEMENT Right 04/20/2022   Procedure: PORT PLACEMENT;  Surgeon: Sim Dryer, MD;  Location: [facility] OR;  Service: General;  Laterality: Right;   Patient Active Problem List   Diagnosis Date Noted   Other fatigue 08/28/2022   Acute pain 08/28/2022   Genetic testing 06/01/2022   Family history of breast cancer 05/15/2022   Family history of pancreatic cancer 05/15/2022   Family history of prostate cancer 05/15/2022    Port-A-Cath in place 04/28/2022   Malignant neoplasm of upper-outer quadrant of left breast in female, estrogen receptor positive (HCC) 03/29/2022    PCP:   REFERRING PROVIDER: Cameron Cea, MD   REFERRING DIAG: Left Breast lymphedema/cording  THERAPY DIAG:  Malignant neoplasm of upper-outer quadrant of left female breast, unspecified estrogen receptor status (HCC)  Aftercare following surgery for neoplasm  Cramp and spasm  ONSET DATE: March but exacerbated end of November  Rationale for Evaluation and Treatment: Rehabilitation  SUBJECTIVE:  SUBJECTIVE STATEMENT:  I was sore for several days following DN but I think it was helpful.  I've been working on relaxing my shoulders and not hunching over the computer and my phone.    EVAL I was using the Flexitouch 2 times per week and over time I noticed my breast swelling got worse. I have started doing some manual  lymphatic drainage as well and have been using the machine 4-5 times per week. It has gotten better. I have some cording issues too with some tightness. My breast has been sore at the top of my chest, and medial inferior side, and lateral side.  I have still been wearing the pad in my bra. My breast feels heavy and when I take my bra off it is painful.  I got some new compression bras before Thanksgiving, and one of them is the Wear Ease. I sleep in it every night. I got some cami's and sport bra's  PERTINENT HISTORY:  03/14/2022:Screening detected left breast masses 1.5 cm and 1.3 cm with enlarged left axillary lymph node, biopsy revealed grade 3 IDC with lymph node being positive, ER 95%, PR 0%, HER2 2+ by IHC and FISH positive with a ratio 2.45 and a copy #4.9, the second biopsy was HER2 negative   1. Neoadjuvant chemotherapy with Herceptin   Perjeta  and letrozole  for 8 cycles.  (Our original recommendation is TCHP x6 cycles but patient is not willing to go through chemo) 2. left lumpectomy and ALND 09/20/2022: 0.6 cm IDC 2/14 lymph nodes, margins negative, ER 95%, PR 0%, HER2 positive 3. Followed by adjuvant radiation therapy  4.  Adjuvant antiestrogen therapy with neratinib She had left lumpectomy on 09/20/2022 with 2/14 LN's  PAIN:  Are you having pain? Yes NPRS scale: 3/10 bil upper traps, levator scap muscles Pain location: left breast, lateral, and left pectorals Pain orientation: Left and Upper  PAIN TYPE: aching, tight, and sore, heavy Pain description: constant  Aggravating factors: removing bra, movement, walking, jumping,sleeping on left Relieving factors: compression, MLD  PRECAUTIONS: Left UE lymphedema precautions   RED FLAGS: None   WEIGHT BEARING RESTRICTIONS: No  FALLS:  Has patient fallen in last 6 months? No  LIVING ENVIRONMENT: Lives with: partner Sylvester Evert Lives in: House/apartment Has following equipment at home: None  OCCUPATION: speech pathologist;works for [employer]   LEISURE: walking, out to eat  HAND DOMINANCE: right   PRIOR LEVEL OF FUNCTION: Independent  PATIENT GOALS:    OBJECTIVE: Note: Objective measures were completed at Evaluation unless otherwise noted.  COGNITION: Overall cognitive status: Within functional limits for tasks assessed   PALPATION: Tender medial, lateral and proximal left breast, left UT, lats, pectorals  OBSERVATIONS / OTHER ASSESSMENTS:generalized left breast swelling, mild fibrosis near lateral breast incision with tenderness, very few enlarged pores, cording noted left axillary region running below axillary incision to elbow region  SENSATION: Light touch: Deficits    POSTURE:  forward head, rounded shoulders   UPPER EXTREMITY AROM/PROM:  A/PROM RIGHT   eval   Shoulder extension 57  Shoulder flexion 165  Shoulder abduction 180   Shoulder internal rotation 65  Shoulder external rotation 97    (Blank rows = not tested)  A/PROM LEFT   eval LEFT 11/08/2023  Shoulder extension 50   Shoulder flexion 149 160  Shoulder abduction 154 168  Shoulder internal rotation 48  +   Shoulder external rotation 91     (Blank rows = not tested)  CERVICAL AROM: All within normal limits:  UPPER EXTREMITY STRENGTH:   LYMPHEDEMA ASSESSMENTS:   SURGERY TYPE/DATE:  Left lumpectomy,09/20/2022   NUMBER OF LYMPH NODES REMOVED: 2+/14  CHEMOTHERAPY: No at her request, Did Herceptin , Perjeta  and letrozole    RADIATION:Yes, but stopped early at her request  HORMONE TREATMENT: YES  INFECTIONS: NO   LYMPHEDEMA ASSESSMENTS:   LANDMARK RIGHT  eval  At axilla  25.2  15 cm proximal to olecranon process   10 cm proximal to olecranon process 22.7  Olecranon process 21.6  15 cm proximal to ulnar styloid process   10 cm proximal to ulnar styloid process 20.3  Just proximal to ulnar styloid process 14.2  Across hand at thumb web space 19.3  At base of 2nd digit 5.7  (Blank rows = not tested)  LANDMARK LEFT  eval  At axilla  25.2  15 cm proximal to olecranon process   10 cm proximal to olecranon process 22.2  Olecranon process 21.8  15 cm proximal to ulnar styloid process   10 cm proximal to ulnar styloid process 19.1  Just proximal to ulnar styloid process 14.0  Across hand at thumb web space 18.3  At base of 2nd digit 5.9  (Blank rows = not tested)   FUNCTIONAL TESTS:    GAIT: WNL   QUICK DASH SURVEY:   BREAST COMPLAINTS QUESTIONNAIRE Pain:9 Heaviness:8 Swollen feeling:9 Tense Skin:7 Redness:0 Bra Print:10 Size of Pores:8 Hard feeling: 6 Total:   57  /80 A Score over 9 indicates lymphedema issues in the breast   TODAY'S TREATMENT:                                                                                                                                          DATE:  11/28/23: Manual therapy:  soft tissue mobilization to cervical and bil shoulder musculature Trigger Point Dry Needling  Subsequent Treatment: Instructions provided previously at initial dry needling treatment.   Patient Verbal Consent Given: Yes Education Handout Provided: Previously Provided Muscles Treated: bil upper traps, bil levator scap, bil cervical multifidi marination, left pectorals, left teres Electrical Stimulation Performed: No Treatment Response/Outcome:  improved soft tissue mobility and decreased tender point size and number 11/26/2023 Prayer stretch x 3, prayer stretch with  quadruped hand walk to right to stretch lateral trunk x 3 Single arm wall stretch for pectorals x 3 B Supine on half foam roll 3 D flexion, scaption, horizontal abd STM to bilateral UT,and lateral trunk and left scapular area with cocoa butter Scar massage to lumpectomy incision followed by MLD MLD to lateral breast only today;Short neck, 5 diaphragmatic breaths, Left inguinal nodes and establishment of axillo-inguinal pathway,  then L lateral  breast moving fluid towards pathways ,,spending extra time in any areas of fibrosis then retracing all steps and ending with LN's. 11/21/23: Manual therapy: soft tissue mobilization to cervical and left shoulder musculature Trigger Point Dry Needling Initial Treatment: Pt instructed  on Dry Needling rational, procedures, and possible side effects. Pt instructed to expect mild to moderate muscle soreness later in the day and/or into the next day.  Pt instructed in methods to reduce muscle soreness. Pt instructed to continue prescribed HEP. Because Dry Needling was performed over or adjacent to a lung field, pt was educated on S/S of pneumothorax and to seek immediate medical attention should they occur.  Patient was educated on signs and symptoms of infection and other risk factors and advised to seek medical attention should they occur.  Patient verbalized understanding of these instructions  and education.   Patient Verbal Consent Given: Yes Education Handout Provided: Yes Muscles Treated: bil upper traps, bil levator scap, bil cervical multifidi marination, left pectorals, left teres Electrical Stimulation Performed: No Treatment Response/Outcome: soreness right > left initially; pt reports she can "feel it release" in cervical musculature during marination; improved soft tissue mobility and decreased tender point size and number    11/18/2022 STM to bilateral UT with emphasis on left UT and pectorals using cocoabutter;limited due to late and wanting to spend more time on MLD Scar mobilization to left breast incision left breast MLD to address breast heaviness In supine: Short neck, 5 diaphragmatic breaths, R axillary nodes and establishment of interaxillary pathway, L inguinal nodes and establishment of axilloinguinal pathway, then L breast moving fluid towards pathways , and in SL to posterior interaxillary Anastomosis,spending extra time in any areas of fibrosis then retracing all steps and ending with LN's. Discussed DN to UT/pectorals. Pt willing to try and added to schedule today 11/12/2023 Cervical rotation x 5 B UT stretch x 3 B, Levator stretch x 3 B STM to left UT, Pectorals and lateral trunk and left scapular area with cocoa butter Continued MLD to medial breast only today;Short neck, 5 diaphragmatic breaths, R axillary nodes and establishment of interaxillary pathway,  then L medial breast moving fluid towards pathways ,,spending extra time in any areas of fibrosis then retracing all steps and ending with LN's. Snow angels x 5 Wall arc and prayer stretch with walk to right and at center x 3 11/08/2023 Ball rolls on wall x 10 forward and 5 abd Pec stretch on doorway x 3 Supine snow angel x 5, scaption x 5, horizontal abd x 5 MFR to left upper arm cording STM to bilateral UT,and lateral trunk and left scapular area with cocoa butter PROM left shoulder flexion,  abduction, ER Measured AROM left shoulder 11/06/2023 STM to bilateral UT, left pectorals and lateral trunk and left scapular area with cocoa butter MFR to left upper arm cording Initiated left breast MLD to address breast heaviness In supine: Short neck, 5 diaphragmatic breaths, R axillary nodes and establishment of interaxillary pathway, L inguinal nodes and establishment of axilloinguinal pathway, then L breast moving fluid towards pathways , and in SL to posterior interaxillary Anastomosis,spending extra time in any areas of fibrosis then retracing all steps and ending with LN's..  10/31/2023 STM to bilateral UT, left pectorals and lateral trunk and left scapular area with cocoa butter Initiated left breast MLD to address breast heaviness In supine: Short neck, 5 diaphragmatic breaths, R axillary nodes and establishment of interaxillary pathway, L inguinal nodes and establishment of axilloinguinal pathway, then L breast moving fluid towards pathways , and in SL to posterior interaxillary Anastomosis,spending extra time in any areas of fibrosis then retracing all steps and ending with LN's. Small nodular area that is moveable noted in left inferior medial breast. Pt will  be having an US  in January and will get it checked.   10/30/2023 STM to bilateral UT, left pectorals and lateral trunk with cocoa butter Stargazer stretch x 5, supine wand flex x 3 held due to left shoulder pain, VC to depress scapula Supine snow angel x 5 MFR to left UE cording PROM left shoulder with MFR to left axillary region, flex, abduction, ER   10/22/2023 Discussed POC, treatment interventions. Practiced stargazer stretch and LTR with arms extended to start pectoral stretching for HEP   PATIENT EDUCATION:  Education details: Discussed POC, treatment interventions. Practiced stargazer stretch and LTR with arms extended to start pectoral stretching and to do at home Person educated: Patient Education method:  Explanation Education comprehension: verbalized understanding and returned demonstration  HOME EXERCISE PROGRAM: To start back to stargazer and LTR to stretch pectorals  ASSESSMENT:  CLINICAL IMPRESSION: The patient benefits significantly from dry needling and manual therapy to stimulate underlying myofascial trigger points and muscular tissue for management of neuromusculoskeletal pain and address movement impairments.  Much improved soft tissue mobility and decreased tender point size and number following treatment session and overall fewer tender points and myofascial restriction compared to prior visit.   The patient was encouraged in regular performance of HEP post DN including soft tissue lengthening and strengthening exercises to enhance long term benefit.    OBJECTIVE IMPAIRMENTS: decreased activity tolerance, decreased knowledge of condition, decreased ROM, decreased strength, increased edema, impaired UE functional use, postural dysfunction, and pain.   ACTIVITY LIMITATIONS: carrying, sleeping, reach over head, and hygiene/grooming  PARTICIPATION LIMITATIONS:  pt is doing all but with some breast pain/discomfort, limitations in left shoulder ROM  PERSONAL FACTORS:  3+ comorbidities: Left breast cancer s/p infusions and radiation  are also affecting patient's functional outcome.    REHAB POTENTIAL: Excellent  CLINICAL DECISION MAKING: Stable/uncomplicated  EVALUATION COMPLEXITY: Low  GOALS: Goals reviewed with patient? Yes  SHORT TERM GOALS=LONG TERM GOALS: Target date: 12/02/2022    Pt will be independent in self MLD for reducing left breast edema Baseline: Goal status: INITIAL   2.  Pt will note decreased breast swelling/tenderness by atleast 50% Baseline:  Goal status: INITIAL   3.  Pt will be able to lie on her left side to sleep for short periods of time Baseline:  Goal status: INITIAL   4.  Pt will have left shoulder ROM  flex and abd Within 5-10 degrees of  right without increased pain  for improved reaching Baseline:R  flex 165, abd 180 Goal status: MET flexion 11/08/2023,  5.  Breast complaints survey will be reduced to no greater than 20 Baseline: 57 Goal status: INITIAL    PLAN:  PT FREQUENCY: 2x/week  PT DURATION: 6 weeks  PLANNED INTERVENTIONS: 97164- PT Re-evaluation, 97110-Therapeutic exercises, 97112- Neuromuscular re-education, 97535- Self Care, 82956- Manual therapy, 97760- Orthotic Fit/training, 97016- Vasopneumatic device, Patient/Family education, Manual lymph drainage, Scar mobilization, Therapeutic exercises, Therapeutic activity, Neuromuscular re-education, and Self Care, Dry needling.  PLAN FOR NEXT SESSION:assess response to DN #2, STM left UQ,  MFR cording left,update HEP with stretches, PROM left, Left breast MLD  Darien Eden, PT 11/28/23 1:08 PM Phone: [phone] Fax: [phone]

## 2023-12-01 ENCOUNTER — Encounter: Payer: Self-pay | Admitting: Hematology and Oncology

## 2023-12-03 ENCOUNTER — Ambulatory Visit: Payer: 59

## 2023-12-03 DIAGNOSIS — C50412 Malignant neoplasm of upper-outer quadrant of left female breast: Secondary | ICD-10-CM

## 2023-12-03 DIAGNOSIS — R252 Cramp and spasm: Secondary | ICD-10-CM

## 2023-12-03 DIAGNOSIS — I890 Lymphedema, not elsewhere classified: Secondary | ICD-10-CM

## 2023-12-03 DIAGNOSIS — Z483 Aftercare following surgery for neoplasm: Secondary | ICD-10-CM

## 2023-12-03 DIAGNOSIS — M25612 Stiffness of left shoulder, not elsewhere classified: Secondary | ICD-10-CM

## 2023-12-03 NOTE — Therapy (Signed)
OUTPATIENT PHYSICAL THERAPY  UPPER EXTREMITY ONCOLOGY PROGRESS NOTE  Patient Name: Caitlyn Torres MRN: [id number] DOB:03-10-1961, 63 y.o., female Today's Date: 12/03/2023  END OF SESSION:  PT End of Session - 12/03/23 1011     Visit Number 11    Number of Visits 19    Date for PT Re-Evaluation 12/31/23    Authorization Type [employer]    PT Start Time 1012   late   PT Stop Time 1054    PT Time Calculation (min) 42 min    Activity Tolerance Patient tolerated treatment well    Behavior During Therapy WFL for tasks assessed/performed             Past Medical History:  Diagnosis Date   Anxiety    Arthritis    Cancer (HCC) 03/2022   L breast   Depression    Family history of breast cancer 05/15/2022   Family history of pancreatic cancer 05/15/2022   Family history of prostate cancer 05/15/2022   GERD (gastroesophageal reflux disease)    History of hiatal hernia    patient believes she may have been told she has this years ago   Past Surgical History:  Procedure Laterality Date   BREAST BIOPSY  09/19/2022   MM LT RADIOACTIVE SEED EA ADD LESION LOC MAMMO GUIDE 09/19/2022 [facility]   BREAST BIOPSY  09/19/2022   MM LT RADIOACTIVE SEED LOC MAMMO GUIDE 09/19/2022 [facility]   BREAST LUMPECTOMY WITH RADIOACTIVE SEED AND AXILLARY LYMPH NODE DISSECTION Left 09/20/2022   Procedure: LEFT BREAST SEED LUMPECTOMY, LEFT AXILLARY LYMPH NODE DISSECTION;  Surgeon: Harriette Bouillon, MD;  Location: [facility] OR;  Service: General;  Laterality: Left;  GEN & PEC BLOCK   CERVIX SURGERY     Laser removal of pre-cancer cells   LASIK Bilateral    PORTACATH PLACEMENT Right 04/20/2022   Procedure: PORT PLACEMENT;  Surgeon: Harriette Bouillon, MD;  Location: [facility] OR;  Service: General;  Laterality: Right;   Patient Active Problem List   Diagnosis Date Noted   Other fatigue 08/28/2022   Acute pain 08/28/2022   Genetic testing 06/01/2022   Family history of breast cancer 05/15/2022   Family  history of pancreatic cancer 05/15/2022   Family history of prostate cancer 05/15/2022   Port-A-Cath in place 04/28/2022   Malignant neoplasm of upper-outer quadrant of left breast in female, estrogen receptor positive (HCC) 03/29/2022    PCP:   REFERRING PROVIDER: [name], MD   REFERRING DIAG: Left Breast lymphedema/cording  THERAPY DIAG:  Malignant neoplasm of upper-outer quadrant of left female breast, unspecified estrogen receptor status (HCC)  Aftercare following surgery for neoplasm  Cramp and spasm  Stiffness of left shoulder, not elsewhere classified  Lymphedema of breast  ONSET DATE: March but exacerbated end of November  Rationale for Evaluation and Treatment: Rehabilitation  SUBJECTIVE:  SUBJECTIVE STATEMENT:  The day after last visit  of DN I felt better, and the next day I was really tight and in pain. This morning it didn't feel as bad. but hurting the most in Right UT. Breast swelling was a little worse this am, and I was disappointed because I have been the MLD after my  shower every day. The firm part at the medial breast hurts when I take my bra off. I do feel the benefit from DN and I do my stretches every day for my neck.   EVAL I was using the Flexitouch 2 times per week and over time I noticed my breast swelling got worse. I have started doing some manual  lymphatic drainage as well and have been using the machine 4-5 times per week. It has gotten better. I have some cording issues too with some tightness. My breast has been sore at the top of my chest, and medial inferior side, and lateral side.  I have still been wearing the pad in my bra. My breast feels heavy and when I take my bra off it is painful.  I got some new compression bras before Thanksgiving, and one of them  is the Wear Ease. I sleep in it every night. I got some cami's and sport bra's  PERTINENT HISTORY:  03/14/2022:Screening detected left breast masses 1.5 cm and 1.3 cm with enlarged left axillary lymph node, biopsy revealed grade 3 IDC with lymph node being positive, ER 95%, PR 0%, HER2 2+ by IHC and FISH positive with a ratio 2.45 and a copy #4.9, the second biopsy was HER2 negative   1. Neoadjuvant chemotherapy with Herceptin Perjeta and letrozole for 8 cycles.  (Our original recommendation is TCHP x6 cycles but patient is not willing to go through chemo) 2. left lumpectomy and ALND 09/20/2022: 0.6 cm IDC 2/14 lymph nodes, margins negative, ER 95%, PR 0%, HER2 positive 3. Followed by adjuvant radiation therapy  4.  Adjuvant antiestrogen therapy with neratinib She had left lumpectomy on 09/20/2022 with 2/14 LN's  PAIN:  Are you having pain? Yes NPRS scale: 3/10 R upper traps,  2/10 Left UT,levator scap muscles, left breast 4/10 Pain location: left breast, lateral, and left pectorals Pain orientation: Left and Upper  PAIN TYPE: aching, tight, and sore, heavy Pain description: constant  Aggravating factors: removing bra, movement, walking, jumping,sleeping on left Relieving factors: compression, MLD  PRECAUTIONS: Left UE lymphedema precautions   RED FLAGS: None   WEIGHT BEARING RESTRICTIONS: No  FALLS:  Has patient fallen in last 6 months? 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  COGNITION: Overall cognitive status: Within functional limits for tasks assessed   PALPATION: Tender medial, lateral and proximal left breast, left UT, lats,  pectorals  OBSERVATIONS / OTHER ASSESSMENTS:generalized left breast swelling, mild fibrosis near lateral breast incision with tenderness, very few enlarged pores, cording noted left axillary region running below axillary incision to elbow region  SENSATION: Light touch: Deficits    POSTURE:  forward head, rounded shoulders   UPPER EXTREMITY AROM/PROM:  A/PROM RIGHT   eval   Shoulder extension 57  Shoulder flexion 165  Shoulder abduction 180  Shoulder internal rotation 65  Shoulder external rotation 97    (Blank rows = not tested)  A/PROM LEFT   eval LEFT 11/08/2023 LEFT 12/03/2023  Shoulder extension 50    Shoulder flexion 149 160 160  Shoulder abduction 154 168 169  Shoulder internal rotation 48  +    Shoulder external rotation 91      (Blank rows = not tested)  CERVICAL AROM: All within normal limits:   UPPER EXTREMITY STRENGTH:   LYMPHEDEMA ASSESSMENTS:   SURGERY TYPE/DATE:  Left lumpectomy,09/20/2022   NUMBER OF LYMPH NODES REMOVED: 2+/14  CHEMOTHERAPY: No at her request, Did Herceptin, Perjeta and letrozole   RADIATION:Yes, but stopped early at her request  HORMONE TREATMENT: YES  INFECTIONS: NO   LYMPHEDEMA ASSESSMENTS:   LANDMARK RIGHT  eval  At axilla  25.2  15 cm proximal to olecranon process   10 cm proximal to olecranon process 22.7  Olecranon process 21.6  15 cm proximal to ulnar styloid process   10 cm proximal to ulnar styloid process 20.3  Just proximal to ulnar styloid process 14.2  Across hand at thumb web space 19.3  At base of 2nd digit 5.7  (Blank rows = not tested)  LANDMARK LEFT  eval  At axilla  25.2  15 cm proximal to olecranon process   10 cm proximal to olecranon process 22.2  Olecranon process 21.8  15 cm proximal to ulnar styloid process   10 cm proximal to ulnar styloid process 19.1  Just proximal to ulnar styloid process 14.0  Across hand at thumb web space 18.3  At base of 2nd digit 5.9  (Blank rows = not  tested)   FUNCTIONAL TESTS:    GAIT: WNL   QUICK DASH SURVEY:   BREAST COMPLAINTS QUESTIONNAIRE(EVAL) Pain:9 Heaviness:8 Swollen feeling:9 Tense Skin:7 Redness:0 Bra Print:10 Size of Pores:8 Hard feeling: 6 Total:   57  /80 A Score over 9 indicates lymphedema issues in the breast    BREAST COMPLAINTS QUESTIONNAIRE (12/03/2023) Pain:4 Heaviness:3 Swollen feeling:3 Tense Skin1 Redness:0 Bra Print:5 Size of Pores:0 Hard feeling:2  Total:   18  /80 A Score over 9 indicates lymphedema issues in the breast   TODAY'S TREATMENT:                                                                                                                                          DATE:   12/03/2023 Reviewed goals with pt for recert Wall slides flex and abd x 5 ea STM to bilateral UT,and Left lateral trunk and left scapular area with cocoa butter Supine wand flex and scaption x 5 LTR Bilaterally x 3 Tender to palpation axillary, clavicular, sternal and costal pectorals and lateral breast in region of incision Most tender areas are pectorals as opposed to left breast. Discussed doing MLD for greater than 10 minutes for improved benefit   11/28/23: Manual therapy: soft tissue mobilization to  cervical and bil shoulder musculature Trigger Point Dry Needling  Subsequent Treatment: Instructions provided previously at initial dry needling treatment.   Patient Verbal Consent Given: Yes Education Handout Provided: Previously Provided Muscles Treated: bil upper traps, bil levator scap, bil cervical multifidi marination, left pectorals, left teres Electrical Stimulation Performed: No Treatment Response/Outcome:  improved soft tissue mobility and decreased tender point size and number 11/26/2023 Prayer stretch x 3, prayer stretch with  quadruped hand walk to right to stretch lateral trunk x 3 Single arm wall stretch for pectorals x 3 B Supine on half foam roll 3 D flexion, scaption,  horizontal abd STM to bilateral UT,and lateral trunk and left scapular area with cocoa butter Scar massage to lumpectomy incision followed by MLD MLD to lateral breast only today;Short neck, 5 diaphragmatic breaths, Left inguinal nodes and establishment of axillo-inguinal pathway,  then L lateral  breast moving fluid towards pathways ,,spending extra time in any areas of fibrosis then retracing all steps and ending with LN's. 11/21/23: Manual therapy: soft tissue mobilization to cervical and left shoulder musculature Trigger Point Dry Needling Initial Treatment: Pt instructed on Dry Needling rational, procedures, and possible side effects. Pt instructed to expect mild to moderate muscle soreness later in the day and/or into the next day.  Pt instructed in methods to reduce muscle soreness. Pt instructed to continue prescribed HEP. Because Dry Needling was performed over or adjacent to a lung field, pt was educated on S/S of pneumothorax and to seek immediate medical attention should they occur.  Patient was educated on signs and symptoms of infection and other risk factors and advised to seek medical attention should they occur.  Patient verbalized understanding of these instructions and education.   Patient Verbal Consent Given: Yes Education Handout Provided: Yes Muscles Treated: bil upper traps, bil levator scap, bil cervical multifidi marination, left pectorals, left teres Electrical Stimulation Performed: No Treatment Response/Outcome: soreness right > left initially; pt reports she can "feel it release" in cervical musculature during marination; improved soft tissue mobility and decreased tender point size and number    11/18/2022 STM to bilateral UT with emphasis on left UT and pectorals using cocoabutter;limited due to late and wanting to spend more time on MLD Scar mobilization to left breast incision left breast MLD to address breast heaviness In supine: Short neck, 5 diaphragmatic  breaths, R axillary nodes and establishment of interaxillary pathway, L inguinal nodes and establishment of axilloinguinal pathway, then L breast moving fluid towards pathways , and in SL to posterior interaxillary Anastomosis,spending extra time in any areas of fibrosis then retracing all steps and ending with LN's. Discussed DN to UT/pectorals. Pt willing to try and added to schedule today 11/12/2023 Cervical rotation x 5 B UT stretch x 3 B, Levator stretch x 3 B STM to left UT, Pectorals and lateral trunk and left scapular area with cocoa butter Continued MLD to medial breast only today;Short neck, 5 diaphragmatic breaths, R axillary nodes and establishment of interaxillary pathway,  then L medial breast moving fluid towards pathways ,,spending extra time in any areas of fibrosis then retracing all steps and ending with LN's. Snow angels x 5 Wall arc and prayer stretch with walk to right and at center x 3 11/08/2023 Ball rolls on wall x 10 forward and 5 abd Pec stretch on doorway x 3 Supine snow angel x 5, scaption x 5, horizontal abd x 5 MFR to left upper arm cording STM to bilateral UT,and lateral trunk and left scapular  area with cocoa butter PROM left shoulder flexion, abduction, ER Measured AROM left shoulder 11/06/2023 STM to bilateral UT, left pectorals and lateral trunk and left scapular area with cocoa butter MFR to left upper arm cording Initiated left breast MLD to address breast heaviness In supine: Short neck, 5 diaphragmatic breaths, R axillary nodes and establishment of interaxillary pathway, L inguinal nodes and establishment of axilloinguinal pathway, then L breast moving fluid towards pathways , and in SL to posterior interaxillary Anastomosis,spending extra time in any areas of fibrosis then retracing all steps and ending with LN's.Marland Kitchen    PATIENT EDUCATION:  Education details: Discussed POC, treatment interventions. Practiced stargazer stretch and LTR with arms extended  to start pectoral stretching and to do at home Person educated: Patient Education method: Explanation Education comprehension: verbalized understanding and returned demonstration  HOME EXERCISE PROGRAM: To start back to stargazer and LTR to stretch pectorals  ASSESSMENT:  CLINICAL IMPRESSION: Pt has achieved 2 Goals and partially achieved 1 of 6 goals established. She is progressing with remaining goals and lacks only 1 degree of abd to achieve ROM goal. She has nearly MET goal for decreased breast pain by 50% and reports it is 40% better since the beginning. She is independent and compliant with self breast MLD, but may need to do longer for full benefit. Breast Complaints questionnaire has improved from a 57/80 to 18/80 demonstrating good improvement. The patient benefits significantly from dry needling and manual therapy to stimulate underlying myofascial trigger points and muscular tissue for management of neuromusculoskeletal pain and to address movement impairments. She will benefit from  continuation of  skilled therapy to further address DN and Soft Tissue mobility, as well as breast pain, tenderness and swelling.     OBJECTIVE IMPAIRMENTS: decreased activity tolerance, decreased knowledge of condition, decreased ROM, decreased strength, increased edema, impaired UE functional use, postural dysfunction, and pain.   ACTIVITY LIMITATIONS: carrying, sleeping, reach over head, and hygiene/grooming  PARTICIPATION LIMITATIONS:  pt is doing all but with some breast pain/discomfort, limitations in left shoulder ROM  PERSONAL FACTORS:  3+ comorbidities: Left breast cancer s/p infusions and radiation  are also affecting patient's functional outcome.    REHAB POTENTIAL: Excellent  CLINICAL DECISION MAKING: Stable/uncomplicated  EVALUATION COMPLEXITY: Low  GOALS: Goals reviewed with patient? Yes  SHORT TERM GOALS=LONG TERM GOALS: Target date: 12/02/2022    Pt will be independent in  self MLD for reducing left breast edema Baseline: Goal status: MET 12/03/2023  2.  Pt will note decreased breast swelling/tenderness by atleast 50% Baseline:  Goal status: 40% improved, IN Progress   3.  Pt will be able to lie on her left side to sleep for short periods of time Baseline:  Goal status: IN progresss   4.  Pt will have left shoulder ROM  flex and abd Within 5-10 degrees of right without increased pain  for improved reaching Baseline:R  flex 165, abd 180 Goal status: MET flexion 11/08/2023, Nearly MET abd; In progress;lacking 1 degree 5.  Breast complaints survey will be reduced to no greater than 20 Baseline: 57 Goal status: MET 12/03/2023   PLAN:  PT FREQUENCY:1- 2x/week prn  PT DURATION: 4 weeks  PLANNED INTERVENTIONS: 97164- PT Re-evaluation, 97110-Therapeutic exercises, 97112- Neuromuscular re-education, 97535- Self Care, 20254- Manual therapy, 97760- Orthotic Fit/training, 97016- Vasopneumatic device, Patient/Family education, Manual lymph drainage, Scar mobilization, Therapeutic exercises, Therapeutic activity, Neuromuscular re-education, and Self Care, Dry needling.  PLAN FOR NEXT SESSION:assess response to DN #2, STM left UQ,  MFR cording left,update HEP with stretches, PROM left, Left breast MLD  Alvira Monday, PT 12/03/23 3:15 PM Phone: [phone] Fax: [phone]

## 2023-12-05 ENCOUNTER — Ambulatory Visit: Payer: 59 | Admitting: Physical Therapy

## 2023-12-05 DIAGNOSIS — Z483 Aftercare following surgery for neoplasm: Secondary | ICD-10-CM

## 2023-12-05 DIAGNOSIS — M25612 Stiffness of left shoulder, not elsewhere classified: Secondary | ICD-10-CM

## 2023-12-05 DIAGNOSIS — R252 Cramp and spasm: Secondary | ICD-10-CM

## 2023-12-05 DIAGNOSIS — C50412 Malignant neoplasm of upper-outer quadrant of left female breast: Secondary | ICD-10-CM | POA: Diagnosis not present

## 2023-12-05 NOTE — Therapy (Signed)
OUTPATIENT PHYSICAL THERAPY  UPPER EXTREMITY ONCOLOGY PROGRESS NOTE  Patient Name: Caitlyn Torres MRN: [id number] DOB:1961/05/14, 63 y.o., female Today's Date: 12/05/2023  END OF SESSION:  PT End of Session - 12/05/23 1059     Visit Number 12    Date for PT Re-Evaluation 12/31/23    Authorization Type [employer]    PT Start Time 1100    PT Stop Time 1140    PT Time Calculation (min) 40 min    Activity Tolerance Patient tolerated treatment well             Past Medical History:  Diagnosis Date   Anxiety    Arthritis    Cancer (HCC) 03/2022   L breast   Depression    Family history of breast cancer 05/15/2022   Family history of pancreatic cancer 05/15/2022   Family history of prostate cancer 05/15/2022   GERD (gastroesophageal reflux disease)    History of hiatal hernia    patient believes she may have been told she has this years ago   Past Surgical History:  Procedure Laterality Date   BREAST BIOPSY  09/19/2022   MM LT RADIOACTIVE SEED EA ADD LESION LOC MAMMO GUIDE 09/19/2022 [facility]   BREAST BIOPSY  09/19/2022   MM LT RADIOACTIVE SEED LOC MAMMO GUIDE 09/19/2022 [facility]   BREAST LUMPECTOMY WITH RADIOACTIVE SEED AND AXILLARY LYMPH NODE DISSECTION Left 09/20/2022   Procedure: LEFT BREAST SEED LUMPECTOMY, LEFT AXILLARY LYMPH NODE DISSECTION;  Surgeon: Harriette Bouillon, MD;  Location: [facility] OR;  Service: General;  Laterality: Left;  GEN & PEC BLOCK   CERVIX SURGERY     Laser removal of pre-cancer cells   LASIK Bilateral    PORTACATH PLACEMENT Right 04/20/2022   Procedure: PORT PLACEMENT;  Surgeon: Harriette Bouillon, MD;  Location: [facility] OR;  Service: General;  Laterality: Right;   Patient Active Problem List   Diagnosis Date Noted   Other fatigue 08/28/2022   Acute pain 08/28/2022   Genetic testing 06/01/2022   Family history of breast cancer 05/15/2022   Family history of pancreatic cancer 05/15/2022   Family history of prostate cancer 05/15/2022    Port-A-Cath in place 04/28/2022   Malignant neoplasm of upper-outer quadrant of left breast in female, estrogen receptor positive (HCC) 03/29/2022    PCP:   REFERRING PROVIDER: [name], MD   REFERRING DIAG: Left Breast lymphedema/cording  THERAPY DIAG:  Malignant neoplasm of upper-outer quadrant of left female breast, unspecified estrogen receptor status (HCC)  Aftercare following surgery for neoplasm  Cramp and spasm  Stiffness of left shoulder, not elsewhere classified  ONSET DATE: March but exacerbated end of November  Rationale for Evaluation and Treatment: Rehabilitation  SUBJECTIVE:  SUBJECTIVE STATEMENT:  Responding well to DN.  Got soreness and tightness afterward.  Feel some tightness left side base of the head.     EVAL I was using the Flexitouch 2 times per week and over time I noticed my breast swelling got worse. I have started doing some manual  lymphatic drainage as well and have been using the machine 4-5 times per week. It has gotten better. I have some cording issues too with some tightness. My breast has been sore at the top of my chest, and medial inferior side, and lateral side.  I have still been wearing the pad in my bra. My breast feels heavy and when I take my bra off it is painful.  I got some new compression bras before Thanksgiving, and one of them is the Wear Ease. I sleep in it every night. I got some cami's and sport bra's  PERTINENT HISTORY:  03/14/2022:Screening detected left breast masses 1.5 cm and 1.3 cm with enlarged left axillary lymph node, biopsy revealed grade 3 IDC with lymph node being positive, ER 95%, PR 0%, HER2 2+ by IHC and FISH positive with a ratio 2.45 and a copy #4.9, the second biopsy was HER2 negative   1. Neoadjuvant chemotherapy with  Herceptin Perjeta and letrozole for 8 cycles.  (Our original recommendation is TCHP x6 cycles but patient is not willing to go through chemo) 2. left lumpectomy and ALND 09/20/2022: 0.6 cm IDC 2/14 lymph nodes, margins negative, ER 95%, PR 0%, HER2 positive 3. Followed by adjuvant radiation therapy  4.  Adjuvant antiestrogen therapy with neratinib She had left lumpectomy on 09/20/2022 with 2/14 LN's  PAIN:  Are you having pain? Yes NPRS scale: 3/10 R upper traps, Pain location: left breast, lateral, and left pectorals Pain orientation: Left and Upper  PAIN TYPE: aching, tight, and sore, heavy Pain description: constant  Aggravating factors: removing bra, movement, walking, jumping,sleeping on left Relieving factors: compression, MLD  PRECAUTIONS: Left UE lymphedema precautions   RED FLAGS: None   WEIGHT BEARING RESTRICTIONS: No  FALLS:  Has patient fallen in last 6 months? 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  COGNITION: Overall cognitive status: Within functional limits for tasks assessed   PALPATION: Tender medial, lateral and proximal left breast, left UT, lats, pectorals  OBSERVATIONS / OTHER ASSESSMENTS:generalized left breast swelling, mild fibrosis near lateral breast incision with tenderness, very few enlarged pores, cording noted left axillary region running below axillary incision to elbow region  SENSATION: Light touch: Deficits    POSTURE:  forward head, rounded shoulders   UPPER EXTREMITY AROM/PROM:  A/PROM RIGHT   eval   Shoulder extension 57  Shoulder flexion 165  Shoulder abduction 180  Shoulder internal  rotation 65  Shoulder external rotation 97    (Blank rows = not tested)  A/PROM LEFT   eval LEFT 11/08/2023 LEFT 12/03/2023  Shoulder extension 50    Shoulder flexion 149 160 160  Shoulder abduction 154 168 169  Shoulder internal rotation 48  +    Shoulder external rotation 91      (Blank rows = not tested)  CERVICAL AROM: All within normal limits:   UPPER EXTREMITY STRENGTH:  LYMPHEDEMA ASSESSMENTS:   SURGERY TYPE/DATE:  Left lumpectomy,09/20/2022   NUMBER OF LYMPH NODES REMOVED: 2+/14  CHEMOTHERAPY: No at her request, Did Herceptin, Perjeta and letrozole   RADIATION:Yes, but stopped early at her request  HORMONE TREATMENT: YES  INFECTIONS: NO   LYMPHEDEMA ASSESSMENTS:   LANDMARK RIGHT  eval  At axilla  25.2  15 cm proximal to olecranon process   10 cm proximal to olecranon process 22.7  Olecranon process 21.6  15 cm proximal to ulnar styloid process   10 cm proximal to ulnar styloid process 20.3  Just proximal to ulnar styloid process 14.2  Across hand at thumb web space 19.3  At base of 2nd digit 5.7  (Blank rows = not tested)  LANDMARK LEFT  eval  At axilla  25.2  15 cm proximal to olecranon process   10 cm proximal to olecranon process 22.2  Olecranon process 21.8  15 cm proximal to ulnar styloid process   10 cm proximal to ulnar styloid process 19.1  Just proximal to ulnar styloid process 14.0  Across hand at thumb web space 18.3  At base of 2nd digit 5.9  (Blank rows = not tested)   FUNCTIONAL TESTS:    GAIT: WNL   QUICK DASH SURVEY:   BREAST COMPLAINTS QUESTIONNAIRE(EVAL) Pain:9 Heaviness:8 Swollen feeling:9 Tense Skin:7 Redness:0 Bra Print:10 Size of Pores:8 Hard feeling: 6 Total:   57  /80 A Score over 9 indicates lymphedema issues in the breast    BREAST COMPLAINTS QUESTIONNAIRE (12/03/2023) Pain:4 Heaviness:3 Swollen feeling:3 Tense Skin1 Redness:0 Bra Print:5 Size of Pores:0 Hard feeling:2  Total:   18  /80 A  Score over 9 indicates lymphedema issues in the breast   TODAY'S TREATMENT:                                                                                                                                         12/05/23: Specific ex's to lengthen upper cervical especially suboccipitals Chin tucks  Chin tuck with neck flexion Suboccipital release with tennis balls Manual therapy: soft tissue mobilization to cervical and bil shoulder musculature Trigger Point Dry Needling Subsequent Treatment: Instructions provided previously at initial dry needling treatment.  Patient Verbal Consent Given: Yes Education Handout Provided: Previously Provided Muscles Treated: bil upper traps, bil levator scap, bil cervical multifidi marination,bil suboccipitals,  left pectorals, left teres Electrical Stimulation Performed: No Treatment Response/Outcome:  improved soft tissue mobility and decreased tender point size and number  DATE:   12/03/2023 Reviewed goals with pt for recert Wall slides flex and abd x 5 ea STM to bilateral UT,and Left lateral trunk and left scapular area with cocoa butter Supine wand flex and scaption x 5 LTR Bilaterally x 3 Tender to palpation axillary, clavicular, sternal and costal pectorals and lateral breast in region of incision Most tender areas are pectorals as opposed to left breast. Discussed doing MLD for  greater than 10 minutes for improved benefit   11/28/23: Manual therapy: soft tissue mobilization to cervical and bil shoulder musculature Trigger Point Dry Needling  Subsequent Treatment: Instructions provided previously at initial dry needling treatment.   Patient Verbal Consent Given: Yes Education Handout Provided: Previously Provided Muscles Treated: bil upper traps, bil levator scap, bil cervical multifidi marination, left pectorals, left teres Electrical Stimulation Performed: No Treatment Response/Outcome:  improved soft tissue mobility and decreased tender  point size and number 11/26/2023 Prayer stretch x 3, prayer stretch with  quadruped hand walk to right to stretch lateral trunk x 3 Single arm wall stretch for pectorals x 3 B Supine on half foam roll 3 D flexion, scaption, horizontal abd STM to bilateral UT,and lateral trunk and left scapular area with cocoa butter Scar massage to lumpectomy incision followed by MLD MLD to lateral breast only today;Short neck, 5 diaphragmatic breaths, Left inguinal nodes and establishment of axillo-inguinal pathway,  then L lateral  breast moving fluid towards pathways ,,spending extra time in any areas of fibrosis then retracing all steps and ending with LN's. 11/21/23: Manual therapy: soft tissue mobilization to cervical and left shoulder musculature Trigger Point Dry Needling Initial Treatment: Pt instructed on Dry Needling rational, procedures, and possible side effects. Pt instructed to expect mild to moderate muscle soreness later in the day and/or into the next day.  Pt instructed in methods to reduce muscle soreness. Pt instructed to continue prescribed HEP. Because Dry Needling was performed over or adjacent to a lung field, pt was educated on S/S of pneumothorax and to seek immediate medical attention should they occur.  Patient was educated on signs and symptoms of infection and other risk factors and advised to seek medical attention should they occur.  Patient verbalized understanding of these instructions and education.   Patient Verbal Consent Given: Yes Education Handout Provided: Yes Muscles Treated: bil upper traps, bil levator scap, bil cervical multifidi marination, left pectorals, left teres Electrical Stimulation Performed: No Treatment Response/Outcome: soreness right > left initially; pt reports she can "feel it release" in cervical musculature during marination; improved soft tissue mobility and decreased tender point size and number    11/18/2022 STM to bilateral UT with emphasis on  left UT and pectorals using cocoabutter;limited due to late and wanting to spend more time on MLD Scar mobilization to left breast incision left breast MLD to address breast heaviness In supine: Short neck, 5 diaphragmatic breaths, R axillary nodes and establishment of interaxillary pathway, L inguinal nodes and establishment of axilloinguinal pathway, then L breast moving fluid towards pathways , and in SL to posterior interaxillary Anastomosis,spending extra time in any areas of fibrosis then retracing all steps and ending with LN's. Discussed DN to UT/pectorals. Pt willing to try and added to schedule today 11/12/2023 Cervical rotation x 5 B UT stretch x 3 B, Levator stretch x 3 B STM to left UT, Pectorals and lateral trunk and left scapular area with cocoa butter Continued MLD to medial breast only today;Short neck, 5 diaphragmatic breaths, R axillary nodes and establishment of interaxillary pathway,  then L medial breast moving fluid towards pathways ,,spending extra time in any areas of fibrosis then retracing all steps and ending with LN's. Snow angels x 5 Wall arc and prayer stretch with walk to right and at center x 3 11/08/2023 Ball rolls on wall x 10 forward and 5 abd Pec stretch on doorway x 3 Supine snow angel x 5, scaption x 5, horizontal abd x  5 MFR to left upper arm cording STM to bilateral UT,and lateral trunk and left scapular area with cocoa butter PROM left shoulder flexion, abduction, ER Measured AROM left shoulder 11/06/2023 STM to bilateral UT, left pectorals and lateral trunk and left scapular area with cocoa butter MFR to left upper arm cording Initiated left breast MLD to address breast heaviness In supine: Short neck, 5 diaphragmatic breaths, R axillary nodes and establishment of interaxillary pathway, L inguinal nodes and establishment of axilloinguinal pathway, then L breast moving fluid towards pathways , and in SL to posterior interaxillary Anastomosis,spending  extra time in any areas of fibrosis then retracing all steps and ending with LN's.Marland Kitchen    PATIENT EDUCATION:  Education details: Discussed POC, treatment interventions. Practiced stargazer stretch and LTR with arms extended to start pectoral stretching and to do at home Person educated: Patient Education method: Explanation Education comprehension: verbalized understanding and returned demonstration  HOME EXERCISE PROGRAM: To start back to stargazer and LTR to stretch pectorals  ASSESSMENT:  CLINICAL IMPRESSION: The patient benefits significantly from dry needling and manual therapy to stimulate underlying myofascial trigger points and muscular tissue for management of neuromusculoskeletal pain and address movement impairments.  The patient had numerous muscle twitches produced during dry needling which is a good prognostic indicator for benefit as it is associated with positive neuromotor and nervous system changes.  Addressed tender points in left suboccipitals and instructed in specific ex's to lengthen musculature.     OBJECTIVE IMPAIRMENTS: decreased activity tolerance, decreased knowledge of condition, decreased ROM, decreased strength, increased edema, impaired UE functional use, postural dysfunction, and pain.   ACTIVITY LIMITATIONS: carrying, sleeping, reach over head, and hygiene/grooming  PARTICIPATION LIMITATIONS:  pt is doing all but with some breast pain/discomfort, limitations in left shoulder ROM  PERSONAL FACTORS:  3+ comorbidities: Left breast cancer s/p infusions and radiation  are also affecting patient's functional outcome.    REHAB POTENTIAL: Excellent  CLINICAL DECISION MAKING: Stable/uncomplicated  EVALUATION COMPLEXITY: Low  GOALS: Goals reviewed with patient? Yes  SHORT TERM GOALS=LONG TERM GOALS: Target date: 12/02/2022    Pt will be independent in self MLD for reducing left breast edema Baseline: Goal status: MET 12/03/2023  2.  Pt will note decreased  breast swelling/tenderness by atleast 50% Baseline:  Goal status: 40% improved, IN Progress   3.  Pt will be able to lie on her left side to sleep for short periods of time Baseline:  Goal status: IN progresss   4.  Pt will have left shoulder ROM  flex and abd Within 5-10 degrees of right without increased pain  for improved reaching Baseline:R  flex 165, abd 180 Goal status: MET flexion 11/08/2023, Nearly MET abd; In progress;lacking 1 degree 5.  Breast complaints survey will be reduced to no greater than 20 Baseline: 57 Goal status: MET 12/03/2023   PLAN:  PT FREQUENCY:1- 2x/week prn  PT DURATION: 4 weeks  PLANNED INTERVENTIONS: 97164- PT Re-evaluation, 97110-Therapeutic exercises, 97112- Neuromuscular re-education, 97535- Self Care, 82956- Manual therapy, 97760- Orthotic Fit/training, 97016- Vasopneumatic device, Patient/Family education, Manual lymph drainage, Scar mobilization, Therapeutic exercises, Therapeutic activity, Neuromuscular re-education, and Self Care, Dry needling.  PLAN FOR NEXT SESSION:assess response to DN #3, STM left UQ,  MFR cording left,update HEP with stretches, PROM left, Left breast MLD  Lavinia Sharps, PT 12/05/23 11:41 AM Phone: [phone] Fax: [phone]

## 2023-12-11 ENCOUNTER — Ambulatory Visit: Payer: 59

## 2023-12-11 DIAGNOSIS — M25612 Stiffness of left shoulder, not elsewhere classified: Secondary | ICD-10-CM

## 2023-12-11 DIAGNOSIS — R252 Cramp and spasm: Secondary | ICD-10-CM

## 2023-12-11 DIAGNOSIS — C50412 Malignant neoplasm of upper-outer quadrant of left female breast: Secondary | ICD-10-CM | POA: Diagnosis not present

## 2023-12-11 DIAGNOSIS — Z483 Aftercare following surgery for neoplasm: Secondary | ICD-10-CM

## 2023-12-11 DIAGNOSIS — I890 Lymphedema, not elsewhere classified: Secondary | ICD-10-CM

## 2023-12-11 NOTE — Therapy (Signed)
OUTPATIENT PHYSICAL THERAPY  UPPER EXTREMITY ONCOLOGY PROGRESS NOTE  Patient Name: Caitlyn Torres MRN: [id number] DOB:October 18, 1961, 63 y.o., female Today's Date: 12/11/2023  END OF SESSION:  PT End of Session - 12/11/23 0806     Visit Number 13    Number of Visits 19    Date for PT Re-Evaluation 12/31/23    Authorization Type [employer]    PT Start Time 0813    PT Stop Time 0858    PT Time Calculation (min) 45 min    Activity Tolerance Patient tolerated treatment well    Behavior During Therapy [facility] for tasks assessed/performed             Past Medical History:  Diagnosis Date   Anxiety    Arthritis    Cancer (HCC) 03/2022   L breast   Depression    Family history of breast cancer 05/15/2022   Family history of pancreatic cancer 05/15/2022   Family history of prostate cancer 05/15/2022   GERD (gastroesophageal reflux disease)    History of hiatal hernia    patient believes she may have been told she has this years ago   Past Surgical History:  Procedure Laterality Date   BREAST BIOPSY  09/19/2022   MM LT RADIOACTIVE SEED EA ADD LESION LOC MAMMO GUIDE 09/19/2022 [facility]   BREAST BIOPSY  09/19/2022   MM LT RADIOACTIVE SEED LOC MAMMO GUIDE 09/19/2022 [facility]   BREAST LUMPECTOMY WITH RADIOACTIVE SEED AND AXILLARY LYMPH NODE DISSECTION Left 09/20/2022   Procedure: LEFT BREAST SEED LUMPECTOMY, LEFT AXILLARY LYMPH NODE DISSECTION;  Surgeon: Harriette Bouillon, MD;  Location: [facility] OR;  Service: General;  Laterality: Left;  GEN & PEC BLOCK   CERVIX SURGERY     Laser removal of pre-cancer cells   LASIK Bilateral    PORTACATH PLACEMENT Right 04/20/2022   Procedure: PORT PLACEMENT;  Surgeon: Harriette Bouillon, MD;  Location: [facility] OR;  Service: General;  Laterality: Right;   Patient Active Problem List   Diagnosis Date Noted   Other fatigue 08/28/2022   Acute pain 08/28/2022   Genetic testing 06/01/2022   Family history of breast cancer 05/15/2022   Family history of  pancreatic cancer 05/15/2022   Family history of prostate cancer 05/15/2022   Port-A-Cath in place 04/28/2022   Malignant neoplasm of upper-outer quadrant of left breast in female, estrogen receptor positive (HCC) 03/29/2022    PCP:   REFERRING PROVIDER: [name], MD   REFERRING DIAG: Left Breast lymphedema/cording  THERAPY DIAG:  Malignant neoplasm of upper-outer quadrant of left female breast, unspecified estrogen receptor status (HCC)  Aftercare following surgery for neoplasm  Cramp and spasm  Stiffness of left shoulder, not elsewhere classified  Lymphedema of breast  ONSET DATE: March but exacerbated end of November  Rationale for Evaluation and Treatment: Rehabilitation  SUBJECTIVE:  SUBJECTIVE STATEMENT:   I am still tight, but I think its better. I put the pads in my bra and my breast feels better afterwards   EVAL I was using the Flexitouch 2 times per week and over time I noticed my breast swelling got worse. I have started doing some manual  lymphatic drainage as well and have been using the machine 4-5 times per week. It has gotten better. I have some cording issues too with some tightness. My breast has been sore at the top of my chest, and medial inferior side, and lateral side.  I have still been wearing the pad in my bra. My breast feels heavy and when I take my bra off it is painful.  I got some new compression bras before Thanksgiving, and one of them is the Wear Ease. I sleep in it every night. I got some cami's and sport bra's  PERTINENT HISTORY:  03/14/2022:Screening detected left breast masses 1.5 cm and 1.3 cm with enlarged left axillary lymph node, biopsy revealed grade 3 IDC with lymph node being positive, ER 95%, PR 0%, HER2 2+ by IHC and FISH positive with a ratio 2.45  and a copy #4.9, the second biopsy was HER2 negative   1. Neoadjuvant chemotherapy with Herceptin Perjeta and letrozole for 8 cycles.  (Our original recommendation is TCHP x6 cycles but patient is not willing to go through chemo) 2. left lumpectomy and ALND 09/20/2022: 0.6 cm IDC 2/14 lymph nodes, margins negative, ER 95%, PR 0%, HER2 positive 3. Followed by adjuvant radiation therapy  4.  Adjuvant antiestrogen therapy with neratinib She had left lumpectomy on 09/20/2022 with 2/14 LN's  PAIN:  Are you having pain? Yes NPRS scale: 2/10 medial and lateral left breast, 3/10 B. upper traps, Pain location: left breast, lateral, and left pectorals Pain orientation: Left and Upper  PAIN TYPE: aching, tight, and sore, heavy Pain description: constant  Aggravating factors: removing bra, movement, walking, jumping,sleeping on left Relieving factors: compression, MLD  PRECAUTIONS: Left UE lymphedema precautions   RED FLAGS: None   WEIGHT BEARING RESTRICTIONS: No  FALLS:  Has patient fallen in last 6 months? 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  COGNITION: Overall cognitive status: Within functional limits for tasks assessed   PALPATION: Tender medial, lateral and proximal left breast, left UT, lats, pectorals  OBSERVATIONS / OTHER ASSESSMENTS:generalized left breast swelling, mild fibrosis near lateral breast incision with tenderness, very few enlarged pores, cording noted left axillary region running below axillary incision to elbow region  SENSATION: Light touch: Deficits    POSTURE:  forward head, rounded shoulders   UPPER EXTREMITY  AROM/PROM:  A/PROM RIGHT   eval   Shoulder extension 57  Shoulder flexion 165  Shoulder abduction 180  Shoulder internal rotation 65  Shoulder external rotation 97    (Blank rows = not tested)  A/PROM LEFT   eval LEFT 11/08/2023 LEFT 12/03/2023  Shoulder extension 50    Shoulder flexion 149 160 160  Shoulder abduction 154 168 169  Shoulder internal rotation 48  +    Shoulder external rotation 91      (Blank rows = not tested)  CERVICAL AROM: All within normal  limits:   UPPER EXTREMITY STRENGTH:   LYMPHEDEMA ASSESSMENTS:   SURGERY TYPE/DATE:  Left lumpectomy,09/20/2022   NUMBER OF LYMPH NODES REMOVED: 2+/14  CHEMOTHERAPY: No at her request, Did Herceptin, Perjeta and letrozole   RADIATION:Yes, but stopped early at her request  HORMONE TREATMENT: YES  INFECTIONS: NO   LYMPHEDEMA ASSESSMENTS:   LANDMARK RIGHT  eval  At axilla  25.2  15 cm proximal to olecranon process   10 cm proximal to olecranon process 22.7  Olecranon process 21.6  15 cm proximal to ulnar styloid process   10 cm proximal to ulnar styloid process 20.3  Just proximal to ulnar styloid process 14.2  Across hand at thumb web space 19.3  At base of 2nd digit 5.7  (Blank rows = not tested)  LANDMARK LEFT  eval  At axilla  25.2  15 cm proximal to olecranon process   10 cm proximal to olecranon process 22.2  Olecranon process 21.8  15 cm proximal to ulnar styloid process   10 cm proximal to ulnar styloid process 19.1  Just proximal to ulnar styloid process 14.0  Across hand at thumb web space 18.3  At base of 2nd digit 5.9  (Blank rows = not tested)   FUNCTIONAL TESTS:    GAIT: WNL   QUICK DASH SURVEY:   BREAST COMPLAINTS QUESTIONNAIRE(EVAL) Pain:9 Heaviness:8 Swollen feeling:9 Tense Skin:7 Redness:0 Bra Print:10 Size of Pores:8 Hard feeling: 6 Total:   57  /80 A Score over 9 indicates lymphedema issues in the breast    BREAST COMPLAINTS QUESTIONNAIRE  (12/03/2023) Pain:4 Heaviness:3 Swollen feeling:3 Tense Skin1 Redness:0 Bra Print:5 Size of Pores:0 Hard feeling:2  Total:   18  /80 A Score over 9 indicates lymphedema issues in the breast   TODAY'S TREATMENT:     12/11/2023 Prayer stretch straight back and to the sides x 3 ea Cervical retractions x 10 Snow angels x 5 ea B LTR bilaterally x 3 ea with arms outstretched to stretch pectorals Manual traction, suboccipital release STM bilateral UT, posterior cervicals, levator, left pectorals, and lateral trunk with cocoa butter MFR left upper arm/axilla to area of cording                                                                                                                                      12/05/23: Specific ex's to lengthen upper cervical especially suboccipitals Chin tucks  Chin tuck with neck flexion Suboccipital release with tennis balls Manual therapy: soft tissue mobilization to cervical and bil shoulder musculature Trigger Point Dry Needling Subsequent Treatment: Instructions provided previously at initial dry needling treatment.  Patient Verbal Consent Given: Yes Education Handout Provided: Previously Provided Muscles Treated: bil upper traps, bil levator scap, bil cervical multifidi marination,bil suboccipitals,  left pectorals, left teres Electrical Stimulation Performed: No Treatment Response/Outcome:  improved soft tissue mobility and decreased tender point size and number  12/03/2023 Reviewed goals with pt  for recert Wall slides flex and abd x 5 ea STM to bilateral UT,and Left lateral trunk and left scapular area with cocoa butter Supine wand flex and scaption x 5 LTR Bilaterally x 3 Tender to palpation axillary, clavicular, sternal and costal pectorals and lateral breast in region of incision Most tender areas are pectorals as opposed to left breast. Discussed doing MLD for greater than 10 minutes for improved benefit   11/28/23: Manual therapy:  soft tissue mobilization to cervical and bil shoulder musculature Trigger Point Dry Needling  Subsequent Treatment: Instructions provided previously at initial dry needling treatment.   Patient Verbal Consent Given: Yes Education Handout Provided: Previously Provided Muscles Treated: bil upper traps, bil levator scap, bil cervical multifidi marination, left pectorals, left teres Electrical Stimulation Performed: No Treatment Response/Outcome:  improved soft tissue mobility and decreased tender point size and number 11/26/2023 Prayer stretch x 3, prayer stretch with  quadruped hand walk to right to stretch lateral trunk x 3 Single arm wall stretch for pectorals x 3 B Supine on half foam roll 3 D flexion, scaption, horizontal abd STM to bilateral UT,and lateral trunk and left scapular area with cocoa butter Scar massage to lumpectomy incision followed by MLD MLD to lateral breast only today;Short neck, 5 diaphragmatic breaths, Left inguinal nodes and establishment of axillo-inguinal pathway,  then L lateral  breast moving fluid towards pathways ,,spending extra time in any areas of fibrosis then retracing all steps and ending with LN's. 11/21/23: Manual therapy: soft tissue mobilization to cervical and left shoulder musculature Trigger Point Dry Needling Initial Treatment: Pt instructed on Dry Needling rational, procedures, and possible side effects. Pt instructed to expect mild to moderate muscle soreness later in the day and/or into the next day.  Pt instructed in methods to reduce muscle soreness. Pt instructed to continue prescribed HEP. Because Dry Needling was performed over or adjacent to a lung field, pt was educated on S/S of pneumothorax and to seek immediate medical attention should they occur.  Patient was educated on signs and symptoms of infection and other risk factors and advised to seek medical attention should they occur.  Patient verbalized understanding of these instructions  and education.   Patient Verbal Consent Given: Yes Education Handout Provided: Yes Muscles Treated: bil upper traps, bil levator scap, bil cervical multifidi marination, left pectorals, left teres Electrical Stimulation Performed: No Treatment Response/Outcome: soreness right > left initially; pt reports she can "feel it release" in cervical musculature during marination; improved soft tissue mobility and decreased tender point size and number    11/18/2022 STM to bilateral UT with emphasis on left UT and pectorals using cocoabutter;limited due to late and wanting to spend more time on MLD Scar mobilization to left breast incision left breast MLD to address breast heaviness In supine: Short neck, 5 diaphragmatic breaths, R axillary nodes and establishment of interaxillary pathway, L inguinal nodes and establishment of axilloinguinal pathway, then L breast moving fluid towards pathways , and in SL to posterior interaxillary Anastomosis,spending extra time in any areas of fibrosis then retracing all steps and ending with LN's. Discussed DN to UT/pectorals. Pt willing to try and added to schedule today 11/12/2023 Cervical rotation x 5 B UT stretch x 3 B, Levator stretch x 3 B STM to left UT, Pectorals and lateral trunk and left scapular area with cocoa butter Continued MLD to medial breast only today;Short neck, 5 diaphragmatic breaths, R axillary nodes and establishment of interaxillary pathway,  then L medial breast moving  fluid towards pathways ,,spending extra time in any areas of fibrosis then retracing all steps and ending with LN's. Snow angels x 5 Wall arc and prayer stretch with walk to right and at center x 3 11/08/2023 Ball rolls on wall x 10 forward and 5 abd Pec stretch on doorway x 3 Supine snow angel x 5, scaption x 5, horizontal abd x 5 MFR to left upper arm cording STM to bilateral UT,and lateral trunk and left scapular area with cocoa butter PROM left shoulder flexion,  abduction, ER Measured AROM left shoulder 11/06/2023 STM to bilateral UT, left pectorals and lateral trunk and left scapular area with cocoa butter MFR to left upper arm cording Initiated left breast MLD to address breast heaviness In supine: Short neck, 5 diaphragmatic breaths, R axillary nodes and establishment of interaxillary pathway, L inguinal nodes and establishment of axilloinguinal pathway, then L breast moving fluid towards pathways , and in SL to posterior interaxillary Anastomosis,spending extra time in any areas of fibrosis then retracing all steps and ending with LN's.Marland Kitchen    PATIENT EDUCATION:  Education details: Discussed POC, treatment interventions. Practiced stargazer stretch and LTR with arms extended to start pectoral stretching and to do at home Person educated: Patient Education method: Explanation Education comprehension: verbalized understanding and returned demonstration  HOME EXERCISE PROGRAM: To start back to stargazer and LTR to stretch pectorals  ASSESSMENT:  CLINICAL IMPRESSION:  Good releases noted with manual traction and suboccipital releases.  Pectorals and lateral trunk still tight and tender. Pt felt looser after treatment.   OBJECTIVE IMPAIRMENTS: decreased activity tolerance, decreased knowledge of condition, decreased ROM, decreased strength, increased edema, impaired UE functional use, postural dysfunction, and pain.   ACTIVITY LIMITATIONS: carrying, sleeping, reach over head, and hygiene/grooming  PARTICIPATION LIMITATIONS:  pt is doing all but with some breast pain/discomfort, limitations in left shoulder ROM  PERSONAL FACTORS:  3+ comorbidities: Left breast cancer s/p infusions and radiation  are also affecting patient's functional outcome.    REHAB POTENTIAL: Excellent  CLINICAL DECISION MAKING: Stable/uncomplicated  EVALUATION COMPLEXITY: Low  GOALS: Goals reviewed with patient? Yes  SHORT TERM GOALS=LONG TERM GOALS: Target date:  12/02/2022    Pt will be independent in self MLD for reducing left breast edema Baseline: Goal status: MET 12/03/2023  2.  Pt will note decreased breast swelling/tenderness by atleast 50% Baseline:  Goal status: 40% improved, IN Progress   3.  Pt will be able to lie on her left side to sleep for short periods of time Baseline:  Goal status: IN progresss   4.  Pt will have left shoulder ROM  flex and abd Within 5-10 degrees of right without increased pain  for improved reaching Baseline:R  flex 165, abd 180 Goal status: MET flexion 11/08/2023, Nearly MET abd; In progress;lacking 1 degree 5.  Breast complaints survey will be reduced to no greater than 20 Baseline: 57 Goal status: MET 12/03/2023   PLAN:  PT FREQUENCY:1- 2x/week prn  PT DURATION: 4 weeks  PLANNED INTERVENTIONS: 97164- PT Re-evaluation, 97110-Therapeutic exercises, 97112- Neuromuscular re-education, 97535- Self Care, 16109- Manual therapy, 97760- Orthotic Fit/training, 97016- Vasopneumatic device, Patient/Family education, Manual lymph drainage, Scar mobilization, Therapeutic exercises, Therapeutic activity, Neuromuscular re-education, and Self Care, Dry needling.  PLAN FOR NEXT SESSION:assess response to DN #3, STM left UQ,  MFR cording left,update HEP with stretches, PROM left, Left breast MLD  Lavinia Sharps, PT 12/11/23 9:00 AM Phone: [phone] Fax: [phone]

## 2023-12-12 ENCOUNTER — Ambulatory Visit: Payer: 59 | Admitting: Physical Therapy

## 2023-12-12 DIAGNOSIS — M25612 Stiffness of left shoulder, not elsewhere classified: Secondary | ICD-10-CM

## 2023-12-12 DIAGNOSIS — C50412 Malignant neoplasm of upper-outer quadrant of left female breast: Secondary | ICD-10-CM | POA: Diagnosis not present

## 2023-12-12 DIAGNOSIS — R252 Cramp and spasm: Secondary | ICD-10-CM

## 2023-12-12 DIAGNOSIS — Z483 Aftercare following surgery for neoplasm: Secondary | ICD-10-CM

## 2023-12-12 NOTE — Therapy (Signed)
OUTPATIENT PHYSICAL THERAPY  UPPER EXTREMITY ONCOLOGY PROGRESS NOTE  Patient Name: Caitlyn Torres MRN: [id number] DOB:07/14/1961, 63 y.o., female Today's Date: 12/12/2023  END OF SESSION:  PT End of Session - 12/12/23 1111     Visit Number 14    Number of Visits 19    Date for PT Re-Evaluation 12/31/23    Authorization Type [employer]    PT Start Time 1110    PT Stop Time 1144    PT Time Calculation (min) 34 min    Activity Tolerance Patient tolerated treatment well             Past Medical History:  Diagnosis Date   Anxiety    Arthritis    Cancer (HCC) 03/2022   L breast   Depression    Family history of breast cancer 05/15/2022   Family history of pancreatic cancer 05/15/2022   Family history of prostate cancer 05/15/2022   GERD (gastroesophageal reflux disease)    History of hiatal hernia    patient believes she may have been told she has this years ago   Past Surgical History:  Procedure Laterality Date   BREAST BIOPSY  09/19/2022   MM LT RADIOACTIVE SEED EA ADD LESION LOC MAMMO GUIDE 09/19/2022 [facility]   BREAST BIOPSY  09/19/2022   MM LT RADIOACTIVE SEED LOC MAMMO GUIDE 09/19/2022 [facility]   BREAST LUMPECTOMY WITH RADIOACTIVE SEED AND AXILLARY LYMPH NODE DISSECTION Left 09/20/2022   Procedure: LEFT BREAST SEED LUMPECTOMY, LEFT AXILLARY LYMPH NODE DISSECTION;  Surgeon: Harriette Bouillon, MD;  Location: [facility] OR;  Service: General;  Laterality: Left;  GEN & PEC BLOCK   CERVIX SURGERY     Laser removal of pre-cancer cells   LASIK Bilateral    PORTACATH PLACEMENT Right 04/20/2022   Procedure: PORT PLACEMENT;  Surgeon: Harriette Bouillon, MD;  Location: [facility] OR;  Service: General;  Laterality: Right;   Patient Active Problem List   Diagnosis Date Noted   Other fatigue 08/28/2022   Acute pain 08/28/2022   Genetic testing 06/01/2022   Family history of breast cancer 05/15/2022   Family history of pancreatic cancer 05/15/2022   Family history of prostate  cancer 05/15/2022   Port-A-Cath in place 04/28/2022   Malignant neoplasm of upper-outer quadrant of left breast in female, estrogen receptor positive (HCC) 03/29/2022    PCP:   REFERRING PROVIDER: [name], MD   REFERRING DIAG: Left Breast lymphedema/cording  THERAPY DIAG:  Malignant neoplasm of upper-outer quadrant of left female breast, unspecified estrogen receptor status (HCC)  Aftercare following surgery for neoplasm  Cramp and spasm  Stiffness of left shoulder, not elsewhere classified  ONSET DATE: March but exacerbated end of November  Rationale for Evaluation and Treatment: Rehabilitation  SUBJECTIVE:  SUBJECTIVE STATEMENT:  I'm still tight.  I think the DN helps my neck.  I've been working on my chin tucks.  The neck pull Robin did yesterday really get good.  I used to have a blow up traction -like thing but I've moved a couple of times and don't know where it is.    EVAL I was using the Flexitouch 2 times per week and over time I noticed my breast swelling got worse. I have started doing some manual  lymphatic drainage as well and have been using the machine 4-5 times per week. It has gotten better. I have some cording issues too with some tightness. My breast has been sore at the top of my chest, and medial inferior side, and lateral side.  I have still been wearing the pad in my bra. My breast feels heavy and when I take my bra off it is painful.  I got some new compression bras before Thanksgiving, and one of them is the Wear Ease. I sleep in it every night. I got some cami's and sport bra's  PERTINENT HISTORY:  03/14/2022:Screening detected left breast masses 1.5 cm and 1.3 cm with enlarged left axillary lymph node, biopsy revealed grade 3 IDC with lymph node being positive, ER 95%,  PR 0%, HER2 2+ by IHC and FISH positive with a ratio 2.45 and a copy #4.9, the second biopsy was HER2 negative   1. Neoadjuvant chemotherapy with Herceptin Perjeta and letrozole for 8 cycles.  (Our original recommendation is TCHP x6 cycles but patient is not willing to go through chemo) 2. left lumpectomy and ALND 09/20/2022: 0.6 cm IDC 2/14 lymph nodes, margins negative, ER 95%, PR 0%, HER2 positive 3. Followed by adjuvant radiation therapy  4.  Adjuvant antiestrogen therapy with neratinib She had left lumpectomy on 09/20/2022 with 2/14 LN's  PAIN:  Are you having pain? Yes NPRS scale: 2/10 medial and lateral left breast, 3/10 B. upper traps, Pain location: left breast, lateral, and left pectorals Pain orientation: Left and Upper  PAIN TYPE: aching, tight, and sore, heavy Pain description: constant  Aggravating factors: removing bra, movement, walking, jumping,sleeping on left Relieving factors: compression, MLD  PRECAUTIONS: Left UE lymphedema precautions   RED FLAGS: None   WEIGHT BEARING RESTRICTIONS: No  FALLS:  Has patient fallen in last 6 months? 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  COGNITION: Overall cognitive status: Within functional limits for tasks assessed   PALPATION: Tender medial, lateral and proximal left breast, left UT, lats, pectorals  OBSERVATIONS / OTHER ASSESSMENTS:generalized left breast swelling, mild fibrosis near lateral breast incision with tenderness, very few enlarged pores, cording noted left axillary region running below axillary incision to elbow region  SENSATION: Light touch: Deficits     POSTURE:  forward head, rounded shoulders   UPPER EXTREMITY AROM/PROM:  A/PROM RIGHT   eval   Shoulder extension 57  Shoulder flexion 165  Shoulder abduction 180  Shoulder internal rotation 65  Shoulder external rotation 97    (Blank rows = not tested)  A/PROM LEFT   eval LEFT 11/08/2023 LEFT 12/03/2023  Shoulder extension 50    Shoulder flexion 149 160 160  Shoulder abduction 154 168  169  Shoulder internal rotation 48  +    Shoulder external rotation 91      (Blank rows = not tested)  CERVICAL AROM: All within normal limits:   UPPER EXTREMITY STRENGTH:   LYMPHEDEMA ASSESSMENTS:   SURGERY TYPE/DATE:  Left lumpectomy,09/20/2022   NUMBER OF LYMPH NODES REMOVED: 2+/14  CHEMOTHERAPY: No at her request, Did Herceptin, Perjeta and letrozole   RADIATION:Yes, but stopped early at her request  HORMONE TREATMENT: YES  INFECTIONS: NO   LYMPHEDEMA ASSESSMENTS:   LANDMARK RIGHT  eval  At axilla  25.2  15 cm proximal to olecranon process   10 cm proximal to olecranon process 22.7  Olecranon process 21.6  15 cm proximal to ulnar styloid process   10 cm proximal to ulnar styloid process 20.3  Just proximal to ulnar styloid process 14.2  Across hand at thumb web space 19.3  At base of 2nd digit 5.7  (Blank rows = not tested)  LANDMARK LEFT  eval  At axilla  25.2  15 cm proximal to olecranon process   10 cm proximal to olecranon process 22.2  Olecranon process 21.8  15 cm proximal to ulnar styloid process   10 cm proximal to ulnar styloid process 19.1  Just proximal to ulnar styloid process 14.0  Across hand at thumb web space 18.3  At base of 2nd digit 5.9  (Blank rows = not tested)   FUNCTIONAL TESTS:    GAIT: WNL   QUICK DASH SURVEY:   BREAST COMPLAINTS QUESTIONNAIRE(EVAL) Pain:9 Heaviness:8 Swollen feeling:9 Tense Skin:7 Redness:0 Bra Print:10 Size of Pores:8 Hard feeling: 6 Total:   57  /80 A Score over 9 indicates lymphedema issues in  the breast    BREAST COMPLAINTS QUESTIONNAIRE (12/03/2023) Pain:4 Heaviness:3 Swollen feeling:3 Tense Skin1 Redness:0 Bra Print:5 Size of Pores:0 Hard feeling:2  Total:   18  /80 A Score over 9 indicates lymphedema issues in the breast   TODAY'S TREATMENT:     12/12/23: Review of Chin tucks (pt demo good technique) 10x Discussed Suboccipital release with tennis balls Manual therapy: soft tissue mobilization to cervical and bil shoulder musculature Trigger Point Dry Needling Subsequent Treatment: Instructions provided previously at initial dry needling treatment.  Patient Verbal Consent Given: Yes Education Handout Provided: Previously Provided Muscles Treated: bil upper traps, bil levator scap, bil cervical multifidi marination,bil suboccipitals,  left pectorals, left teres in prone (good twitch) Electrical Stimulation Performed: No Treatment Response/Outcome:  improved soft tissue mobility and decreased tender point size and number 12/11/2023 Prayer stretch straight back and to the sides x 3 ea Cervical retractions x 10 Snow angels x 5 ea B LTR bilaterally x 3 ea with arms outstretched to stretch pectorals Manual traction, suboccipital release STM bilateral UT, posterior cervicals, levator, left pectorals, and lateral trunk with cocoa butter MFR left upper arm/axilla to area of cording  12/05/23: Specific ex's to lengthen upper cervical especially suboccipitals Chin tucks  Chin tuck with neck flexion Suboccipital release with tennis balls Manual therapy: soft tissue mobilization to cervical and bil shoulder musculature Trigger Point Dry Needling Subsequent Treatment: Instructions provided previously at initial dry needling treatment.  Patient Verbal Consent Given: Yes Education Handout Provided: Previously Provided Muscles Treated: bil upper  traps, bil levator scap, bil cervical multifidi marination,bil suboccipitals,  left pectorals, left teres Electrical Stimulation Performed: No Treatment Response/Outcome:  improved soft tissue mobility and decreased tender point size and number  12/03/2023 Reviewed goals with pt for recert Wall slides flex and abd x 5 ea STM to bilateral UT,and Left lateral trunk and left scapular area with cocoa butter Supine wand flex and scaption x 5 LTR Bilaterally x 3 Tender to palpation axillary, clavicular, sternal and costal pectorals and lateral breast in region of incision Most tender areas are pectorals as opposed to left breast. Discussed doing MLD for greater than 10 minutes for improved benefit   11/28/23: Manual therapy: soft tissue mobilization to cervical and bil shoulder musculature Trigger Point Dry Needling  Subsequent Treatment: Instructions provided previously at initial dry needling treatment.   Patient Verbal Consent Given: Yes Education Handout Provided: Previously Provided Muscles Treated: bil upper traps, bil levator scap, bil cervical multifidi marination, left pectorals, left teres Electrical Stimulation Performed: No Treatment Response/Outcome:  improved soft tissue mobility and decreased tender point size and number 11/26/2023 Prayer stretch x 3, prayer stretch with  quadruped hand walk to right to stretch lateral trunk x 3 Single arm wall stretch for pectorals x 3 B Supine on half foam roll 3 D flexion, scaption, horizontal abd STM to bilateral UT,and lateral trunk and left scapular area with cocoa butter Scar massage to lumpectomy incision followed by MLD MLD to lateral breast only today;Short neck, 5 diaphragmatic breaths, Left inguinal nodes and establishment of axillo-inguinal pathway,  then L lateral  breast moving fluid towards pathways ,,spending extra time in any areas of fibrosis then retracing all steps and ending with LN's. 11/21/23: Manual therapy: soft tissue  mobilization to cervical and left shoulder musculature Trigger Point Dry Needling Initial Treatment: Pt instructed on Dry Needling rational, procedures, and possible side effects. Pt instructed to expect mild to moderate muscle soreness later in the day and/or into the next day.  Pt instructed in methods to reduce muscle soreness. Pt instructed to continue prescribed HEP. Because Dry Needling was performed over or adjacent to a lung field, pt was educated on S/S of pneumothorax and to seek immediate medical attention should they occur.  Patient was educated on signs and symptoms of infection and other risk factors and advised to seek medical attention should they occur.  Patient verbalized understanding of these instructions and education.   Patient Verbal Consent Given: Yes Education Handout Provided: Yes Muscles Treated: bil upper traps, bil levator scap, bil cervical multifidi marination, left pectorals, left teres Electrical Stimulation Performed: No Treatment Response/Outcome: soreness right > left initially; pt reports she can "feel it release" in cervical musculature during marination; improved soft tissue mobility and decreased tender point size and number    11/18/2022 STM to bilateral UT with emphasis on left UT and pectorals using cocoabutter;limited due to late and wanting to spend more time on MLD Scar mobilization to left breast incision left breast MLD to address breast heaviness In supine: Short neck, 5 diaphragmatic breaths, R axillary nodes and establishment of interaxillary pathway, L inguinal nodes and establishment of axilloinguinal  pathway, then L breast moving fluid towards pathways , and in SL to posterior interaxillary Anastomosis,spending extra time in any areas of fibrosis then retracing all steps and ending with LN's. Discussed DN to UT/pectorals. Pt willing to try and added to schedule today 11/12/2023 Cervical rotation x 5 B UT stretch x 3 B, Levator stretch x 3  B STM to left UT, Pectorals and lateral trunk and left scapular area with cocoa butter Continued MLD to medial breast only today;Short neck, 5 diaphragmatic breaths, R axillary nodes and establishment of interaxillary pathway,  then L medial breast moving fluid towards pathways ,,spending extra time in any areas of fibrosis then retracing all steps and ending with LN's. Snow angels x 5 Wall arc and prayer stretch with walk to right and at center x 3 11/08/2023 Ball rolls on wall x 10 forward and 5 abd Pec stretch on doorway x 3 Supine snow angel x 5, scaption x 5, horizontal abd x 5 MFR to left upper arm cording STM to bilateral UT,and lateral trunk and left scapular area with cocoa butter PROM left shoulder flexion, abduction, ER Measured AROM left shoulder 11/06/2023 STM to bilateral UT, left pectorals and lateral trunk and left scapular area with cocoa butter MFR to left upper arm cording Initiated left breast MLD to address breast heaviness In supine: Short neck, 5 diaphragmatic breaths, R axillary nodes and establishment of interaxillary pathway, L inguinal nodes and establishment of axilloinguinal pathway, then L breast moving fluid towards pathways , and in SL to posterior interaxillary Anastomosis,spending extra time in any areas of fibrosis then retracing all steps and ending with LN's.Marland Kitchen    PATIENT EDUCATION:  Education details: Discussed POC, treatment interventions. Practiced stargazer stretch and LTR with arms extended to start pectoral stretching and to do at home Person educated: Patient Education method: Explanation Education comprehension: verbalized understanding and returned demonstration  HOME EXERCISE PROGRAM: To start back to stargazer and LTR to stretch pectorals  ASSESSMENT:  CLINICAL IMPRESSION: Overall decreased tender points in upper traps and levator trap muscles.  Some tenderness in bil suboccipitals but has a good release with DN.  The patient had a  significant muscle twitch in left teres which is a good prognostic indicator for benefit as it is associated with positive neuromotor and nervous system changes. She is highly compliant with HEP which will compliment the effects of DN.     OBJECTIVE IMPAIRMENTS: decreased activity tolerance, decreased knowledge of condition, decreased ROM, decreased strength, increased edema, impaired UE functional use, postural dysfunction, and pain.   ACTIVITY LIMITATIONS: carrying, sleeping, reach over head, and hygiene/grooming  PARTICIPATION LIMITATIONS:  pt is doing all but with some breast pain/discomfort, limitations in left shoulder ROM  PERSONAL FACTORS:  3+ comorbidities: Left breast cancer s/p infusions and radiation  are also affecting patient's functional outcome.    REHAB POTENTIAL: Excellent  CLINICAL DECISION MAKING: Stable/uncomplicated  EVALUATION COMPLEXITY: Low  GOALS: Goals reviewed with patient? Yes  SHORT TERM GOALS=LONG TERM GOALS: Target date: 12/02/2022    Pt will be independent in self MLD for reducing left breast edema Baseline: Goal status: MET 12/03/2023  2.  Pt will note decreased breast swelling/tenderness by atleast 50% Baseline:  Goal status: 40% improved, IN Progress   3.  Pt will be able to lie on her left side to sleep for short periods of time Baseline:  Goal status: IN progresss   4.  Pt will have left shoulder ROM  flex and abd Within 5-10 degrees  of right without increased pain  for improved reaching Baseline:R  flex 165, abd 180 Goal status: MET flexion 11/08/2023, Nearly MET abd; In progress;lacking 1 degree 5.  Breast complaints survey will be reduced to no greater than 20 Baseline: 57 Goal status: MET 12/03/2023   PLAN:  PT FREQUENCY:1- 2x/week prn  PT DURATION: 4 weeks  PLANNED INTERVENTIONS: 97164- PT Re-evaluation, 97110-Therapeutic exercises, 97112- Neuromuscular re-education, 97535- Self Care, 16109- Manual therapy, 97760- Orthotic  Fit/training, 97016- Vasopneumatic device, Patient/Family education, Manual lymph drainage, Scar mobilization, Therapeutic exercises, Therapeutic activity, Neuromuscular re-education, and Self Care, Dry needling.  PLAN FOR NEXT SESSION:assess response to DN #4, STM left UQ,  MFR cording left,update HEP with stretches, PROM left, Left breast MLD  Lavinia Sharps, PT 12/12/23 4:45 PM Phone: [phone] Fax: [phone]

## 2023-12-20 ENCOUNTER — Encounter: Payer: Self-pay | Admitting: Family Medicine

## 2023-12-20 ENCOUNTER — Ambulatory Visit: Payer: 59 | Attending: Hematology and Oncology

## 2023-12-20 DIAGNOSIS — C50412 Malignant neoplasm of upper-outer quadrant of left female breast: Secondary | ICD-10-CM | POA: Insufficient documentation

## 2023-12-20 DIAGNOSIS — R252 Cramp and spasm: Secondary | ICD-10-CM | POA: Insufficient documentation

## 2023-12-20 DIAGNOSIS — I890 Lymphedema, not elsewhere classified: Secondary | ICD-10-CM | POA: Insufficient documentation

## 2023-12-20 DIAGNOSIS — Z483 Aftercare following surgery for neoplasm: Secondary | ICD-10-CM | POA: Diagnosis present

## 2023-12-20 DIAGNOSIS — M25612 Stiffness of left shoulder, not elsewhere classified: Secondary | ICD-10-CM | POA: Insufficient documentation

## 2023-12-20 NOTE — Therapy (Signed)
 OUTPATIENT PHYSICAL THERAPY  UPPER EXTREMITY ONCOLOGY PROGRESS NOTE  Patient Name: Caitlyn Torres MRN: [id number] DOB:1961-01-14, 63 y.o., female Today's Date: 12/20/2023  END OF SESSION:  PT End of Session - 12/20/23 1200     Visit Number 15    Number of Visits 19    Date for PT Re-Evaluation 12/31/23    Authorization Type [employer]    PT Start Time 1201    PT Stop Time 1250    PT Time Calculation (min) 49 min    Activity Tolerance Patient tolerated treatment well    Behavior During Therapy WFL for tasks assessed/performed             Past Medical History:  Diagnosis Date   Anxiety    Arthritis    Cancer (HCC) 03/2022   L breast   Depression    Family history of breast cancer 05/15/2022   Family history of pancreatic cancer 05/15/2022   Family history of prostate cancer 05/15/2022   GERD (gastroesophageal reflux disease)    History of hiatal hernia    patient believes she may have been told she has this years ago   Past Surgical History:  Procedure Laterality Date   BREAST BIOPSY  09/19/2022   MM LT RADIOACTIVE SEED EA ADD LESION LOC MAMMO GUIDE 09/19/2022 [facility]   BREAST BIOPSY  09/19/2022   MM LT RADIOACTIVE SEED LOC MAMMO GUIDE 09/19/2022 [facility]   BREAST LUMPECTOMY WITH RADIOACTIVE SEED AND AXILLARY LYMPH NODE DISSECTION Left 09/20/2022   Procedure: LEFT BREAST SEED LUMPECTOMY, LEFT AXILLARY LYMPH NODE DISSECTION;  Surgeon: Vanderbilt Ned, MD;  Location: [facility] OR;  Service: General;  Laterality: Left;  GEN & PEC BLOCK   CERVIX SURGERY     Laser removal of pre-cancer cells   LASIK Bilateral    PORTACATH PLACEMENT Right 04/20/2022   Procedure: PORT PLACEMENT;  Surgeon: Vanderbilt Ned, MD;  Location: [facility] OR;  Service: General;  Laterality: Right;   Patient Active Problem List   Diagnosis Date Noted   Other fatigue 08/28/2022   Acute pain 08/28/2022   Genetic testing 06/01/2022   Family history of breast cancer 05/15/2022   Family history of  pancreatic cancer 05/15/2022   Family history of prostate cancer 05/15/2022   Port-A-Cath in place 04/28/2022   Malignant neoplasm of upper-outer quadrant of left breast in female, estrogen receptor positive (HCC) 03/29/2022    PCP:   REFERRING PROVIDER: Odean Potts, MD   REFERRING DIAG: Left Breast lymphedema/cording  THERAPY DIAG:  Malignant neoplasm of upper-outer quadrant of left female breast, unspecified estrogen receptor status (HCC)  Aftercare following surgery for neoplasm  Cramp and spasm  Stiffness of left shoulder, not elsewhere classified  Lymphedema of breast  ONSET DATE: March but exacerbated end of November  Rationale for Evaluation and Treatment: Rehabilitation  SUBJECTIVE:  SUBJECTIVE STATEMENT:   Last time I hurt for a couple days after the dry needling with a crick in my neck, but it got better. I was really tight in my suboccipitals. I have trouble getting comfortable at night.. I found the neck traction unit 1 time and it helped. I have some pain in the lateral breast and under the left breast  EVAL I was using the Flexitouch 2 times per week and over time I noticed my breast swelling got worse. I have started doing some manual  lymphatic drainage as well and have been using the machine 4-5 times per week. It has gotten better. I have some cording issues too with some tightness. My breast has been sore at the top of my chest, and medial inferior side, and lateral side.  I have still been wearing the pad in my bra. My breast feels heavy and when I take my bra off it is painful.  I got some new compression bras before Thanksgiving, and one of them is the Wear Ease. I sleep in it every night. I got some cami's and sport bra's  PERTINENT HISTORY:  03/14/2022:Screening detected  left breast masses 1.5 cm and 1.3 cm with enlarged left axillary lymph node, biopsy revealed grade 3 IDC with lymph node being positive, ER 95%, PR 0%, HER2 2+ by IHC and FISH positive with a ratio 2.45 and a copy #4.9, the second biopsy was HER2 negative   1. Neoadjuvant chemotherapy with Herceptin  Perjeta  and letrozole  for 8 cycles.  (Our original recommendation is TCHP x6 cycles but patient is not willing to go through chemo) 2. left lumpectomy and ALND 09/20/2022: 0.6 cm IDC 2/14 lymph nodes, margins negative, ER 95%, PR 0%, HER2 positive 3. Followed by adjuvant radiation therapy  4.  Adjuvant antiestrogen therapy with neratinib She had left lumpectomy on 09/20/2022 with 2/14 LN's  PAIN:  Are you having pain? Yes NPRS scale: 3/10 medial and lateral left breast, 2-3/10 B. upper traps, Pain location: left breast, lateral, and left pectorals Pain orientation: Left and Upper  PAIN TYPE: aching, tight, and sore, heavy Pain description: constant  Aggravating factors: removing bra, movement, walking, jumping,sleeping on left Relieving factors: compression, MLD  PRECAUTIONS: Left UE lymphedema precautions   RED FLAGS: None   WEIGHT BEARING RESTRICTIONS: No  FALLS:  Has patient fallen in last 6 months? No  LIVING ENVIRONMENT: Lives with: partner [name] Lives in: House/apartment Has following equipment at home: None  OCCUPATION: speech pathologist;works for [employer]   LEISURE: walking, out to eat  HAND DOMINANCE: right   PRIOR LEVEL OF FUNCTION: Independent  PATIENT GOALS:    OBJECTIVE: Note: Objective measures were completed at Evaluation unless otherwise noted.  COGNITION: Overall cognitive status: Within functional limits for tasks assessed   PALPATION: Tender medial, lateral and proximal left breast, left UT, lats, pectorals  OBSERVATIONS / OTHER ASSESSMENTS:generalized left breast swelling, mild fibrosis near lateral breast incision with tenderness, very few  enlarged pores, cording noted left axillary region running below axillary incision to elbow region  SENSATION: Light touch: Deficits    POSTURE:  forward head, rounded shoulders   UPPER EXTREMITY AROM/PROM:  A/PROM RIGHT   eval   Shoulder extension 57  Shoulder flexion 165  Shoulder abduction 180  Shoulder internal rotation 65  Shoulder external rotation 97    (Blank rows = not tested)  A/PROM LEFT   eval LEFT 11/08/2023 LEFT 12/03/2023  Shoulder extension 50    Shoulder flexion 149 160  160  Shoulder abduction 154 168 169  Shoulder internal rotation 48  +    Shoulder external rotation 91      (Blank rows = not tested)  CERVICAL AROM: All within normal limits:   UPPER EXTREMITY STRENGTH:   LYMPHEDEMA ASSESSMENTS:   SURGERY TYPE/DATE:  Left lumpectomy,09/20/2022   NUMBER OF LYMPH NODES REMOVED: 2+/14  CHEMOTHERAPY: No at her request, Did Herceptin , Perjeta  and letrozole    RADIATION:Yes, but stopped early at her request  HORMONE TREATMENT: YES  INFECTIONS: NO   LYMPHEDEMA ASSESSMENTS:   LANDMARK RIGHT  eval  At axilla  25.2  15 cm proximal to olecranon process   10 cm proximal to olecranon process 22.7  Olecranon process 21.6  15 cm proximal to ulnar styloid process   10 cm proximal to ulnar styloid process 20.3  Just proximal to ulnar styloid process 14.2  Across hand at thumb web space 19.3  At base of 2nd digit 5.7  (Blank rows = not tested)  LANDMARK LEFT  eval  At axilla  25.2  15 cm proximal to olecranon process   10 cm proximal to olecranon process 22.2  Olecranon process 21.8  15 cm proximal to ulnar styloid process   10 cm proximal to ulnar styloid process 19.1  Just proximal to ulnar styloid process 14.0  Across hand at thumb web space 18.3  At base of 2nd digit 5.9  (Blank rows = not tested)   FUNCTIONAL TESTS:    GAIT: WNL   QUICK DASH SURVEY:   BREAST COMPLAINTS QUESTIONNAIRE(EVAL) Pain:9 Heaviness:8 Swollen  feeling:9 Tense Skin:7 Redness:0 Bra Print:10 Size of Pores:8 Hard feeling: 6 Total:   57  /80 A Score over 9 indicates lymphedema issues in the breast    BREAST COMPLAINTS QUESTIONNAIRE (12/03/2023) Pain:4 Heaviness:3 Swollen feeling:3 Tense Skin1 Redness:0 Bra Print:5 Size of Pores:0 Hard feeling:2  Total:   18  /80 A Score over 9 indicates lymphedema issues in the breast   TODAY'S TREATMENT:   12/20/2023 LTR bilaterally x 3 ea with arms outstretched to stretch pectorals Manual traction, suboccipital release STM bilateral UT, posterior cervicals, levator, left pectorals, and lateral trunk with cocoa butter Supine cervical retractions x 10 into therapist hands for guidance Cupping with small clear cup to left lower rib area and left breast incision area after cocoa butter was applied Supine on half foam roll; Flexion, bilateral scaption, horizontal abduction bilaterally x 5 ea     12/12/23: Review of Chin tucks (pt demo good technique) 10x Discussed Suboccipital release with tennis balls Manual therapy: soft tissue mobilization to cervical and bil shoulder musculature Trigger Point Dry Needling Subsequent Treatment: Instructions provided previously at initial dry needling treatment.  Patient Verbal Consent Given: Yes Education Handout Provided: Previously Provided Muscles Treated: bil upper traps, bil levator scap, bil cervical multifidi marination,bil suboccipitals,  left pectorals, left teres in prone (good twitch) Electrical Stimulation Performed: No Treatment Response/Outcome:  improved soft tissue mobility and decreased tender point size and number 12/11/2023 Prayer stretch straight back and to the sides x 3 ea Cervical retractions x 10 Snow angels x 5 ea B LTR bilaterally x 3 ea with arms outstretched to stretch pectorals Manual traction, suboccipital release STM bilateral UT, posterior cervicals, levator, left pectorals, and lateral trunk with cocoa butter MFR  left upper arm/axilla to area of cording  12/05/23: Specific ex's to lengthen upper cervical especially suboccipitals Chin tucks  Chin tuck with neck flexion Suboccipital release with tennis balls Manual therapy: soft tissue mobilization to cervical and bil shoulder musculature Trigger Point Dry Needling Subsequent Treatment: Instructions provided previously at initial dry needling treatment.  Patient Verbal Consent Given: Yes Education Handout Provided: Previously Provided Muscles Treated: bil upper traps, bil levator scap, bil cervical multifidi marination,bil suboccipitals,  left pectorals, left teres Electrical Stimulation Performed: No Treatment Response/Outcome:  improved soft tissue mobility and decreased tender point size and number  12/03/2023 Reviewed goals with pt for recert Wall slides flex and abd x 5 ea STM to bilateral UT,and Left lateral trunk and left scapular area with cocoa butter Supine wand flex and scaption x 5 LTR Bilaterally x 3 Tender to palpation axillary, clavicular, sternal and costal pectorals and lateral breast in region of incision Most tender areas are pectorals as opposed to left breast. Discussed doing MLD for greater than 10 minutes for improved benefit   11/28/23: Manual therapy: soft tissue mobilization to cervical and bil shoulder musculature Trigger Point Dry Needling  Subsequent Treatment: Instructions provided previously at initial dry needling treatment.   Patient Verbal Consent Given: Yes Education Handout Provided: Previously Provided Muscles Treated: bil upper traps, bil levator scap, bil cervical multifidi marination, left pectorals, left teres Electrical Stimulation Performed: No Treatment Response/Outcome:  improved soft tissue mobility and decreased tender point size and number 11/26/2023 Prayer stretch  x 3, prayer stretch with  quadruped hand walk to right to stretch lateral trunk x 3 Single arm wall stretch for pectorals x 3 B Supine on half foam roll 3 D flexion, scaption, horizontal abd STM to bilateral UT,and lateral trunk and left scapular area with cocoa butter Scar massage to lumpectomy incision followed by MLD MLD to lateral breast only today;Short neck, 5 diaphragmatic breaths, Left inguinal nodes and establishment of axillo-inguinal pathway,  then L lateral  breast moving fluid towards pathways ,,spending extra time in any areas of fibrosis then retracing all steps and ending with LN's. 11/21/23: Manual therapy: soft tissue mobilization to cervical and left shoulder musculature Trigger Point Dry Needling Initial Treatment: Pt instructed on Dry Needling rational, procedures, and possible side effects. Pt instructed to expect mild to moderate muscle soreness later in the day and/or into the next day.  Pt instructed in methods to reduce muscle soreness. Pt instructed to continue prescribed HEP. Because Dry Needling was performed over or adjacent to a lung field, pt was educated on S/S of pneumothorax and to seek immediate medical attention should they occur.  Patient was educated on signs and symptoms of infection and other risk factors and advised to seek medical attention should they occur.  Patient verbalized understanding of these instructions and education.   Patient Verbal Consent Given: Yes Education Handout Provided: Yes Muscles Treated: bil upper traps, bil levator scap, bil cervical multifidi marination, left pectorals, left teres Electrical Stimulation Performed: No Treatment Response/Outcome: soreness right > left initially; pt reports she can feel it release in cervical musculature during marination; improved soft tissue mobility and decreased tender point size and number    11/18/2022 STM to bilateral UT with emphasis on left UT and pectorals using cocoabutter;limited  due to late and wanting to spend more time on MLD Scar mobilization to left breast incision left breast MLD to address breast heaviness In supine: Short neck, 5 diaphragmatic breaths, R axillary nodes and establishment of interaxillary pathway, L inguinal nodes and establishment of axilloinguinal  pathway, then L breast moving fluid towards pathways , and in SL to posterior interaxillary Anastomosis,spending extra time in any areas of fibrosis then retracing all steps and ending with LN's. Discussed DN to UT/pectorals. Pt willing to try and added to schedule today 11/12/2023 Cervical rotation x 5 B UT stretch x 3 B, Levator stretch x 3 B STM to left UT, Pectorals and lateral trunk and left scapular area with cocoa butter Continued MLD to medial breast only today;Short neck, 5 diaphragmatic breaths, R axillary nodes and establishment of interaxillary pathway,  then L medial breast moving fluid towards pathways ,,spending extra time in any areas of fibrosis then retracing all steps and ending with LN's. Snow angels x 5 Wall arc and prayer stretch with walk to right and at center x 3 11/08/2023 Ball rolls on wall x 10 forward and 5 abd Pec stretch on doorway x 3 Supine snow angel x 5, scaption x 5, horizontal abd x 5 MFR to left upper arm cording STM to bilateral UT,and lateral trunk and left scapular area with cocoa butter PROM left shoulder flexion, abduction, ER Measured AROM left shoulder 11/06/2023 STM to bilateral UT, left pectorals and lateral trunk and left scapular area with cocoa butter MFR to left upper arm cording Initiated left breast MLD to address breast heaviness In supine: Short neck, 5 diaphragmatic breaths, R axillary nodes and establishment of interaxillary pathway, L inguinal nodes and establishment of axilloinguinal pathway, then L breast moving fluid towards pathways , and in SL to posterior interaxillary Anastomosis,spending extra time in any areas of fibrosis then retracing  all steps and ending with LN's.SABRA    PATIENT EDUCATION:  Education details: Discussed POC, treatment interventions. Practiced stargazer stretch and LTR with arms extended to start pectoral stretching and to do at home Person educated: Patient Education method: Explanation Education comprehension: verbalized understanding and returned demonstration  HOME EXERCISE PROGRAM: To start back to stargazer and LTR to stretch pectorals  ASSESSMENT:  CLINICAL IMPRESSION:  Pt continues to complain of tightness in her neck and UT but does feel DN helps. She was encouraged to try her home traction unit that she recently located again. There continue to be twitch responses and shortened areas in cervical musculature and pectorals. Initiated cupping to left lower ribs and left breast incision to decrease tissue tension, and improve lymphatic flow.   OBJECTIVE IMPAIRMENTS: decreased activity tolerance, decreased knowledge of condition, decreased ROM, decreased strength, increased edema, impaired UE functional use, postural dysfunction, and pain.   ACTIVITY LIMITATIONS: carrying, sleeping, reach over head, and hygiene/grooming  PARTICIPATION LIMITATIONS:  pt is doing all but with some breast pain/discomfort, limitations in left shoulder ROM  PERSONAL FACTORS:  3+ comorbidities: Left breast cancer s/p infusions and radiation  are also affecting patient's functional outcome.    REHAB POTENTIAL: Excellent  CLINICAL DECISION MAKING: Stable/uncomplicated  EVALUATION COMPLEXITY: Low  GOALS: Goals reviewed with patient? Yes  SHORT TERM GOALS=LONG TERM GOALS: Target date: 12/02/2022    Pt will be independent in self MLD for reducing left breast edema Baseline: Goal status: MET 12/03/2023  2.  Pt will note decreased breast swelling/tenderness by atleast 50% Baseline:  Goal status: 40% improved, IN Progress   3.  Pt will be able to lie on her left side to sleep for short periods of time Baseline:   Goal status: IN progresss   4.  Pt will have left shoulder ROM  flex and abd Within 5-10 degrees of right without increased pain  for improved reaching Baseline:R  flex 165, abd 180 Goal status: MET flexion 11/08/2023, Nearly MET abd; In progress;lacking 1 degree 5.  Breast complaints survey will be reduced to no greater than 20 Baseline: 57 Goal status: MET 12/03/2023   PLAN:  PT FREQUENCY:1- 2x/week prn  PT DURATION: 4 weeks  PLANNED INTERVENTIONS: 97164- PT Re-evaluation, 97110-Therapeutic exercises, 97112- Neuromuscular re-education, 97535- Self Care, 02859- Manual therapy, 97760- Orthotic Fit/training, 97016- Vasopneumatic device, Patient/Family education, Manual lymph drainage, Scar mobilization, Therapeutic exercises, Therapeutic activity, Neuromuscular re-education, and Self Care, Dry needling.  PLAN FOR NEXT SESSION:assess response to DN #4, STM left UQ,  MFR cording left,update HEP with stretches, PROM left, Left breast MLD  Glade Pesa, PT 12/20/23 1:00 PM Phone: [phone] Fax: [phone]

## 2023-12-25 ENCOUNTER — Ambulatory Visit: Payer: 59

## 2023-12-25 DIAGNOSIS — I890 Lymphedema, not elsewhere classified: Secondary | ICD-10-CM

## 2023-12-25 DIAGNOSIS — Z483 Aftercare following surgery for neoplasm: Secondary | ICD-10-CM

## 2023-12-25 DIAGNOSIS — C50412 Malignant neoplasm of upper-outer quadrant of left female breast: Secondary | ICD-10-CM | POA: Diagnosis not present

## 2023-12-25 DIAGNOSIS — R252 Cramp and spasm: Secondary | ICD-10-CM

## 2023-12-25 DIAGNOSIS — M25612 Stiffness of left shoulder, not elsewhere classified: Secondary | ICD-10-CM

## 2023-12-25 NOTE — Therapy (Signed)
OUTPATIENT PHYSICAL THERAPY  UPPER EXTREMITY ONCOLOGY PROGRESS NOTE  Patient Name: Caitlyn Torres MRN: [id number] DOB:May 08, 1961, 63 y.o., female Today's Date: 12/25/2023  END OF SESSION:  PT End of Session - 12/25/23 0806     Visit Number 16    Number of Visits 20    Date for PT Re-Evaluation 01/22/24    Authorization Type [employer]    PT Start Time 0807   late   PT Stop Time 0851    PT Time Calculation (min) 44 min    Activity Tolerance Patient tolerated treatment well    Behavior During Therapy [facility] for tasks assessed/performed             Past Medical History:  Diagnosis Date   Anxiety    Arthritis    Cancer (HCC) 03/2022   L breast   Depression    Family history of breast cancer 05/15/2022   Family history of pancreatic cancer 05/15/2022   Family history of prostate cancer 05/15/2022   GERD (gastroesophageal reflux disease)    History of hiatal hernia    patient believes she may have been told she has this years ago   Past Surgical History:  Procedure Laterality Date   BREAST BIOPSY  09/19/2022   MM LT RADIOACTIVE SEED EA ADD LESION LOC MAMMO GUIDE 09/19/2022 [facility]   BREAST BIOPSY  09/19/2022   MM LT RADIOACTIVE SEED LOC MAMMO GUIDE 09/19/2022 [facility]   BREAST LUMPECTOMY WITH RADIOACTIVE SEED AND AXILLARY LYMPH NODE DISSECTION Left 09/20/2022   Procedure: LEFT BREAST SEED LUMPECTOMY, LEFT AXILLARY LYMPH NODE DISSECTION;  Surgeon: Harriette Bouillon, MD;  Location: [facility] OR;  Service: General;  Laterality: Left;  GEN & PEC BLOCK   CERVIX SURGERY     Laser removal of pre-cancer cells   LASIK Bilateral    PORTACATH PLACEMENT Right 04/20/2022   Procedure: PORT PLACEMENT;  Surgeon: Harriette Bouillon, MD;  Location: [facility] OR;  Service: General;  Laterality: Right;   Patient Active Problem List   Diagnosis Date Noted   Other fatigue 08/28/2022   Acute pain 08/28/2022   Genetic testing 06/01/2022   Family history of breast cancer 05/15/2022   Family  history of pancreatic cancer 05/15/2022   Family history of prostate cancer 05/15/2022   Port-A-Cath in place 04/28/2022   Malignant neoplasm of upper-outer quadrant of left breast in female, estrogen receptor positive (HCC) 03/29/2022    PCP:   REFERRING PROVIDER: [name], MD   REFERRING DIAG: Left Breast lymphedema/cording  THERAPY DIAG:  Malignant neoplasm of upper-outer quadrant of left female breast, unspecified estrogen receptor status (HCC)  Aftercare following surgery for neoplasm  Cramp and spasm  Stiffness of left shoulder, not elsewhere classified  Lymphedema of breast  ONSET DATE: March but exacerbated end of November  Rationale for Evaluation and Treatment: Rehabilitation  SUBJECTIVE:  SUBJECTIVE STATEMENT:  Today I don't feel too bad. Saturday  I walked and did some interval things and I was sore afterwards but I felt better and I took a long walk. I feel cordig in the axilla more today. My neck, UT feel better overall. I feel like th DN has helped   EVAL I was using the Flexitouch 2 times per week and over time I noticed my breast swelling got worse. I have started doing some manual  lymphatic drainage as well and have been using the machine 4-5 times per week. It has gotten better. I have some cording issues too with some tightness. My breast has been sore at the top of my chest, and medial inferior side, and lateral side.  I have still been wearing the pad in my bra. My breast feels heavy and when I take my bra off it is painful.  I got some new compression bras before Thanksgiving, and one of them is the Wear Ease. I sleep in it every night. I got some cami's and sport bra's  PERTINENT HISTORY:  03/14/2022:Screening detected left breast masses 1.5 cm and 1.3 cm with enlarged  left axillary lymph node, biopsy revealed grade 3 IDC with lymph node being positive, ER 95%, PR 0%, HER2 2+ by IHC and FISH positive with a ratio 2.45 and a copy #4.9, the second biopsy was HER2 negative   1. Neoadjuvant chemotherapy with Herceptin Perjeta and letrozole for 8 cycles.  (Our original recommendation is TCHP x6 cycles but patient is not willing to go through chemo) 2. left lumpectomy and ALND 09/20/2022: 0.6 cm IDC 2/14 lymph nodes, margins negative, ER 95%, PR 0%, HER2 positive 3. Followed by adjuvant radiation therapy  4.  Adjuvant antiestrogen therapy with neratinib She had left lumpectomy on 09/20/2022 with 2/14 LN's  PAIN:  Are you having pain? Yes NPRS scale: 2/10 medial and lateral left breast, 2/10 B. upper traps, Pain location: left breast, lateral, and left pectorals Pain orientation: Left and Upper  PAIN TYPE: aching, tight, and sore, heavy Pain description: constant  Aggravating factors: removing bra, movement, walking, jumping,sleeping on left Relieving factors: compression, MLD  PRECAUTIONS: Left UE lymphedema precautions   RED FLAGS: None   WEIGHT BEARING RESTRICTIONS: No  FALLS:  Has patient fallen in last 6 months? 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  COGNITION: Overall cognitive status: Within functional limits for tasks assessed   PALPATION: Tender medial, lateral and proximal left breast, left UT, lats, pectorals  OBSERVATIONS / OTHER ASSESSMENTS:generalized left breast swelling, mild fibrosis near lateral breast incision with tenderness, very few enlarged pores, cording noted left axillary region  running below axillary incision to elbow region  SENSATION: Light touch: Deficits    POSTURE:  forward head, rounded shoulders   UPPER EXTREMITY AROM/PROM:  A/PROM RIGHT   eval   Shoulder extension 57  Shoulder flexion 165  Shoulder abduction 180  Shoulder internal rotation 65  Shoulder external rotation 97    (Blank rows = not tested)  A/PROM LEFT   eval LEFT 11/08/2023 LEFT 12/03/2023 LEFT 12/25/2023  Shoulder extension 50     Shoulder flexion 149 160 160 161  Shoulder abduction 154  168 169 166  Shoulder internal rotation 48  +     Shoulder external rotation 91       (Blank rows = not tested)  CERVICAL AROM: All within normal limits:   UPPER EXTREMITY STRENGTH:   LYMPHEDEMA ASSESSMENTS:   SURGERY TYPE/DATE:  Left lumpectomy,09/20/2022   NUMBER OF LYMPH NODES REMOVED: 2+/14  CHEMOTHERAPY: No at her request, Did Herceptin, Perjeta and letrozole   RADIATION:Yes, but stopped early at her request  HORMONE TREATMENT: YES  INFECTIONS: NO   LYMPHEDEMA ASSESSMENTS:   LANDMARK RIGHT  eval  At axilla  25.2  15 cm proximal to olecranon process   10 cm proximal to olecranon process 22.7  Olecranon process 21.6  15 cm proximal to ulnar styloid process   10 cm proximal to ulnar styloid process 20.3  Just proximal to ulnar styloid process 14.2  Across hand at thumb web space 19.3  At base of 2nd digit 5.7  (Blank rows = not tested)  LANDMARK LEFT  eval  At axilla  25.2  15 cm proximal to olecranon process   10 cm proximal to olecranon process 22.2  Olecranon process 21.8  15 cm proximal to ulnar styloid process   10 cm proximal to ulnar styloid process 19.1  Just proximal to ulnar styloid process 14.0  Across hand at thumb web space 18.3  At base of 2nd digit 5.9  (Blank rows = not tested)   FUNCTIONAL TESTS:    GAIT: WNL   QUICK DASH SURVEY:   BREAST COMPLAINTS QUESTIONNAIRE(EVAL) Pain:9 Heaviness:8 Swollen feeling:9 Tense  Skin:7 Redness:0 Bra Print:10 Size of Pores:8 Hard feeling: 6 Total:   57  /80 A Score over 9 indicates lymphedema issues in the breast    BREAST COMPLAINTS QUESTIONNAIRE (12/03/2023) Pain:4 Heaviness:3 Swollen feeling:3 Tense Skin1 Redness:0 Bra Print:5 Size of Pores:0 Hard feeling:2  Total:   18  /80 A Score over 9 indicates lymphedema issues in the breast   TODAY'S TREATMENT:  12/25/2023 Discussed progress toward goals for recert Wall arc x 4 B, wall slides x 5 for abd Overhead pulleys for abd x 3 min to loosen up MFR to left axillary region and upper arm Manual traction, suboccipital release STM bilateral UT, Left pectorals and lateral trunk with cocoa butter Measured AROM left shoulder   12/20/2023 LTR bilaterally x 3 ea with arms outstretched to stretch pectorals Manual traction, suboccipital release STM bilateral UT, posterior cervicals, levator, left pectorals, and lateral trunk with cocoa butter Supine cervical retractions x 10 into therapist hands for guidance Cupping with small clear cup to left lower rib area and left breast incision area after cocoa butter was applied Supine on half foam roll; Flexion, bilateral scaption, horizontal abduction bilaterally x 5 ea     12/12/23: Review of Chin tucks (pt demo good technique) 10x Discussed Suboccipital release with tennis balls Manual therapy: soft tissue mobilization to cervical and bil shoulder musculature Trigger Point Dry Needling Subsequent Treatment: Instructions provided previously at initial dry needling treatment.  Patient Verbal Consent Given: Yes Education Handout Provided: Previously Provided Muscles Treated: bil upper traps, bil levator scap, bil cervical multifidi marination,bil suboccipitals,  left pectorals, left teres in prone (good twitch) Electrical Stimulation Performed: No Treatment Response/Outcome:  improved soft tissue mobility and decreased tender point size and number 12/11/2023 Prayer  stretch straight back and to the sides x 3 ea Cervical retractions x 10 Snow angels x 5 ea B LTR bilaterally x 3 ea with arms outstretched to stretch  pectorals Manual traction, suboccipital release STM bilateral UT, posterior cervicals, levator, left pectorals, and lateral trunk with cocoa butter MFR left upper arm/axilla to area of cording                                                                                                                                      12/05/23: Specific ex's to lengthen upper cervical especially suboccipitals Chin tucks  Chin tuck with neck flexion Suboccipital release with tennis balls Manual therapy: soft tissue mobilization to cervical and bil shoulder musculature Trigger Point Dry Needling Subsequent Treatment: Instructions provided previously at initial dry needling treatment.  Patient Verbal Consent Given: Yes Education Handout Provided: Previously Provided Muscles Treated: bil upper traps, bil levator scap, bil cervical multifidi marination,bil suboccipitals,  left pectorals, left teres Electrical Stimulation Performed: No Treatment Response/Outcome:  improved soft tissue mobility and decreased tender point size and number  12/03/2023 Reviewed goals with pt for recert Wall slides flex and abd x 5 ea STM to bilateral UT,and Left lateral trunk and left scapular area with cocoa butter Supine wand flex and scaption x 5 LTR Bilaterally x 3 Tender to palpation axillary, clavicular, sternal and costal pectorals and lateral breast in region of incision Most tender areas are pectorals as opposed to left breast. Discussed doing MLD for greater than 10 minutes for improved benefit   11/28/23: Manual therapy: soft tissue mobilization to cervical and bil shoulder musculature Trigger Point Dry Needling  Subsequent Treatment: Instructions provided previously at initial dry needling treatment.   Patient Verbal Consent Given: Yes Education Handout  Provided: Previously Provided Muscles Treated: bil upper traps, bil levator scap, bil cervical multifidi marination, left pectorals, left teres Electrical Stimulation Performed: No Treatment Response/Outcome:  improved soft tissue mobility and decreased tender point size and number 11/26/2023 Prayer stretch x 3, prayer stretch with  quadruped hand walk to right to stretch lateral trunk x 3 Single arm wall stretch for pectorals x 3 B Supine on half foam roll 3 D flexion, scaption, horizontal abd STM to bilateral UT,and lateral trunk and left scapular area with cocoa butter Scar massage to lumpectomy incision followed by MLD MLD to lateral breast only today;Short neck, 5 diaphragmatic breaths, Left inguinal nodes and establishment of axillo-inguinal pathway,  then L lateral  breast moving fluid towards pathways ,,spending extra time in any areas of fibrosis then retracing all steps and ending with LN's. 11/21/23: Manual therapy: soft tissue mobilization to cervical and left shoulder musculature Trigger Point Dry Needling Initial Treatment: Pt instructed on Dry Needling rational, procedures, and possible side effects. Pt instructed to expect mild to moderate muscle soreness later in the day and/or into the next day.  Pt instructed in methods to reduce muscle soreness. Pt instructed to continue prescribed HEP. Because Dry Needling was performed over or adjacent to a lung field, pt was educated on S/S of pneumothorax and to seek immediate medical attention  should they occur.  Patient was educated on signs and symptoms of infection and other risk factors and advised to seek medical attention should they occur.  Patient verbalized understanding of these instructions and education.   Patient Verbal Consent Given: Yes Education Handout Provided: Yes Muscles Treated: bil upper traps, bil levator scap, bil cervical multifidi marination, left pectorals, left teres Electrical Stimulation Performed:  No Treatment Response/Outcome: soreness right > left initially; pt reports she can "feel it release" in cervical musculature during marination; improved soft tissue mobility and decreased tender point size and number    11/18/2022 STM to bilateral UT with emphasis on left UT and pectorals using cocoabutter;limited due to late and wanting to spend more time on MLD Scar mobilization to left breast incision left breast MLD to address breast heaviness In supine: Short neck, 5 diaphragmatic breaths, R axillary nodes and establishment of interaxillary pathway, L inguinal nodes and establishment of axilloinguinal pathway, then L breast moving fluid towards pathways , and in SL to posterior interaxillary Anastomosis,spending extra time in any areas of fibrosis then retracing all steps and ending with LN's. Discussed DN to UT/pectorals. Pt willing to try and added to schedule today 11/12/2023 Cervical rotation x 5 B UT stretch x 3 B, Levator stretch x 3 B STM to left UT, Pectorals and lateral trunk and left scapular area with cocoa butter Continued MLD to medial breast only today;Short neck, 5 diaphragmatic breaths, R axillary nodes and establishment of interaxillary pathway,  then L medial breast moving fluid towards pathways ,,spending extra time in any areas of fibrosis then retracing all steps and ending with LN's. Snow angels x 5 Wall arc and prayer stretch with walk to right and at center x 3  PATIENT EDUCATION:  Education details: Discussed POC, treatment interventions. Practiced stargazer stretch and LTR with arms extended to start pectoral stretching and to do at home Person educated: Patient Education method: Explanation Education comprehension: verbalized understanding and returned demonstration  HOME EXERCISE PROGRAM: To start back to stargazer and LTR to stretch pectorals  ASSESSMENT:  CLINICAL IMPRESSION: Pt has met improved breast pain by 50% overall, but still has difficulty  sleeping on her side. She is more bothered by cording in axilla today which limits her AROM for Abduction. She is making good overall improvement with DN with generally improving upper quarter pain. She continues with tenderness in the chest and breast. She is compliant with her HEP. She will continue to  benefit from additional PT to include DN 1x/week, and Manual activities progressing pt to strengthening to achieve remaining goals.    OBJECTIVE IMPAIRMENTS: decreased activity tolerance, decreased knowledge of condition, decreased ROM, decreased strength, increased edema, impaired UE functional use, postural dysfunction, and pain.   ACTIVITY LIMITATIONS: carrying, sleeping, reach over head, and hygiene/grooming  PARTICIPATION LIMITATIONS:  pt is doing all but with some breast pain/discomfort, limitations in left shoulder ROM  PERSONAL FACTORS:  3+ comorbidities: Left breast cancer s/p infusions and radiation  are also affecting patient's functional outcome.    REHAB POTENTIAL: Excellent  CLINICAL DECISION MAKING: Stable/uncomplicated  EVALUATION COMPLEXITY: Low  GOALS: Goals reviewed with patient? Yes  SHORT TERM GOALS=LONG TERM GOALS: Target date: 12/02/2022    Pt will be independent in self MLD for reducing left breast edema Baseline: Goal status: MET 12/03/2023  2.  Pt will note decreased breast swelling/tenderness by atleast 50% Baseline:  Goal status: MET 12/25/2023  3.  Pt will be able to lie on her left side to  sleep for short periods of time Baseline:  Goal status: IN progress   4.  Pt will have left shoulder ROM  flex and abd Within 5-10 degrees of right without increased pain  for improved reaching Baseline:R  flex 165, abd 180 Goal status: MET flexion 11/08/2023, In Progress abd; regressed since last note  5.  Breast complaints survey will be reduced to no greater than 20 Baseline: 57 Goal status: MET 12/03/2023  6  Pt will have decreased neck/upper quarter pain by  50% overall  Goal Status: NEW    PLAN:  PT FREQUENCY:1x/week  PT DURATION: 4 weeks  PLANNED INTERVENTIONS: 97164- PT Re-evaluation, 97110-Therapeutic exercises, 97112- Neuromuscular re-education, 97535- Self Care, 16109- Manual therapy, 97760- Orthotic Fit/training, 97016- Vasopneumatic device, Patient/Family education, Manual lymph drainage, Scar mobilization, Therapeutic exercises, Therapeutic activity, Neuromuscular re-education, and Self Care, Dry needling.  PLAN FOR NEXT SESSION:continue DN, Progress strength, SOZO screen before DC,STM left UQ,  MFR cording left,update HEP with stretches, PROM left, Left breast MLD  Alvira Monday, PT 12/25/23 8:53 AM Phone: [phone] Fax: [phone]

## 2023-12-27 ENCOUNTER — Encounter: Payer: 59 | Admitting: Physical Therapy

## 2024-01-02 ENCOUNTER — Ambulatory Visit: Payer: 59

## 2024-01-07 ENCOUNTER — Ambulatory Visit: Payer: 59 | Admitting: Physical Therapy

## 2024-01-09 ENCOUNTER — Ambulatory Visit: Payer: 59 | Admitting: Physical Therapy

## 2024-01-09 DIAGNOSIS — C50412 Malignant neoplasm of upper-outer quadrant of left female breast: Secondary | ICD-10-CM | POA: Diagnosis not present

## 2024-01-09 DIAGNOSIS — M25612 Stiffness of left shoulder, not elsewhere classified: Secondary | ICD-10-CM

## 2024-01-09 DIAGNOSIS — R252 Cramp and spasm: Secondary | ICD-10-CM

## 2024-01-09 NOTE — Therapy (Signed)
 OUTPATIENT PHYSICAL THERAPY  UPPER EXTREMITY ONCOLOGY PROGRESS NOTE  Patient Name: Caitlyn Torres MRN: [id number] DOB:March 01, 1961, 63 y.o., female Today's Date: 01/09/2024  END OF SESSION:  PT End of Session - 01/09/24 1147     Visit Number 17    Number of Visits 20    Date for PT Re-Evaluation 01/22/24    Authorization Type [employer]    PT Start Time 1147    PT Stop Time 1217    PT Time Calculation (min) 30 min    Activity Tolerance Patient tolerated treatment well             Past Medical History:  Diagnosis Date   Anxiety    Arthritis    Cancer (HCC) 03/2022   L breast   Depression    Family history of breast cancer 05/15/2022   Family history of pancreatic cancer 05/15/2022   Family history of prostate cancer 05/15/2022   GERD (gastroesophageal reflux disease)    History of hiatal hernia    patient believes she may have been told she has this years ago   Past Surgical History:  Procedure Laterality Date   BREAST BIOPSY  09/19/2022   MM LT RADIOACTIVE SEED EA ADD LESION LOC MAMMO GUIDE 09/19/2022 [facility]   BREAST BIOPSY  09/19/2022   MM LT RADIOACTIVE SEED LOC MAMMO GUIDE 09/19/2022 [facility]   BREAST LUMPECTOMY WITH RADIOACTIVE SEED AND AXILLARY LYMPH NODE DISSECTION Left 09/20/2022   Procedure: LEFT BREAST SEED LUMPECTOMY, LEFT AXILLARY LYMPH NODE DISSECTION;  Surgeon: Harriette Bouillon, MD;  Location: [facility] OR;  Service: General;  Laterality: Left;  GEN & PEC BLOCK   CERVIX SURGERY     Laser removal of pre-cancer cells   LASIK Bilateral    PORTACATH PLACEMENT Right 04/20/2022   Procedure: PORT PLACEMENT;  Surgeon: Harriette Bouillon, MD;  Location: [facility] OR;  Service: General;  Laterality: Right;   Patient Active Problem List   Diagnosis Date Noted   Other fatigue 08/28/2022   Acute pain 08/28/2022   Genetic testing 06/01/2022   Family history of breast cancer 05/15/2022   Family history of pancreatic cancer 05/15/2022   Family history of prostate  cancer 05/15/2022   Port-A-Cath in place 04/28/2022   Malignant neoplasm of upper-outer quadrant of left breast in female, estrogen receptor positive (HCC) 03/29/2022    PCP:   REFERRING PROVIDER: [name], MD   REFERRING DIAG: Left Breast lymphedema/cording  THERAPY DIAG:  Malignant neoplasm of upper-outer quadrant of left female breast, unspecified estrogen receptor status (HCC)  Stiffness of left shoulder, not elsewhere classified  Cramp and spasm  ONSET DATE: March but exacerbated end of November  Rationale for Evaluation and Treatment: Rehabilitation  SUBJECTIVE:  SUBJECTIVE STATEMENT:  Discomfort in right > left upper trap.  I move my neck in all directions all day long.  I can get my neck to pop some now.   EVAL I was using the Flexitouch 2 times per week and over time I noticed my breast swelling got worse. I have started doing some manual  lymphatic drainage as well and have been using the machine 4-5 times per week. It has gotten better. I have some cording issues too with some tightness. My breast has been sore at the top of my chest, and medial inferior side, and lateral side.  I have still been wearing the pad in my bra. My breast feels heavy and when I take my bra off it is painful.  I got some new compression bras before Thanksgiving, and one of them is the Wear Ease. I sleep in it every night. I got some cami's and sport bra's  PERTINENT HISTORY:  03/14/2022:Screening detected left breast masses 1.5 cm and 1.3 cm with enlarged left axillary lymph node, biopsy revealed grade 3 IDC with lymph node being positive, ER 95%, PR 0%, HER2 2+ by IHC and FISH positive with a ratio 2.45 and a copy #4.9, the second biopsy was HER2 negative   1. Neoadjuvant chemotherapy with Herceptin Perjeta and  letrozole for 8 cycles.  (Our original recommendation is TCHP x6 cycles but patient is not willing to go through chemo) 2. left lumpectomy and ALND 09/20/2022: 0.6 cm IDC 2/14 lymph nodes, margins negative, ER 95%, PR 0%, HER2 positive 3. Followed by adjuvant radiation therapy  4.  Adjuvant antiestrogen therapy with neratinib She had left lumpectomy on 09/20/2022 with 2/14 LN's  PAIN:  Are you having pain? Yes NPRS scale: 2/10 right > left upper traps Pain location: left breast, lateral, and left pectorals Pain orientation: Left and Upper  PAIN TYPE: aching, tight, and sore, heavy Pain description: constant  Aggravating factors: removing bra, movement, walking, jumping,sleeping on left Relieving factors: compression, MLD  PRECAUTIONS: Left UE lymphedema precautions   RED FLAGS: None   WEIGHT BEARING RESTRICTIONS: No  FALLS:  Has patient fallen in last 6 months? 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  COGNITION: Overall cognitive status: Within functional limits for tasks assessed   PALPATION: Tender medial, lateral and proximal left breast, left UT, lats, pectorals  OBSERVATIONS / OTHER ASSESSMENTS:generalized left breast swelling, mild fibrosis near lateral breast incision with tenderness, very few enlarged pores, cording noted left axillary region running below axillary incision to elbow region  SENSATION: Light touch: Deficits    POSTURE:  forward head, rounded shoulders   UPPER EXTREMITY AROM/PROM:  A/PROM RIGHT   eval   Shoulder extension 57  Shoulder flexion 165  Shoulder abduction 180  Shoulder internal rotation 65   Shoulder external rotation 97    (Blank rows = not tested)  A/PROM LEFT   eval LEFT 11/08/2023 LEFT 12/03/2023 LEFT 12/25/2023  Shoulder extension 50     Shoulder flexion 149 160 160 161  Shoulder abduction 154 168 169 166  Shoulder internal rotation 48  +     Shoulder external rotation 91       (Blank rows =  not tested)  CERVICAL AROM: All within normal limits:   UPPER EXTREMITY STRENGTH:   LYMPHEDEMA ASSESSMENTS:   SURGERY TYPE/DATE:  Left lumpectomy,09/20/2022   NUMBER OF LYMPH NODES REMOVED: 2+/14  CHEMOTHERAPY: No at her request, Did Herceptin, Perjeta and letrozole   RADIATION:Yes, but stopped early at her request  HORMONE TREATMENT: YES  INFECTIONS: NO   LYMPHEDEMA ASSESSMENTS:   LANDMARK RIGHT  eval  At axilla  25.2  15 cm proximal to olecranon process   10 cm proximal to olecranon process 22.7  Olecranon process 21.6  15 cm proximal to ulnar styloid process   10 cm proximal to ulnar styloid process 20.3  Just proximal to ulnar styloid process 14.2  Across hand at thumb web space 19.3  At base of 2nd digit 5.7  (Blank rows = not tested)  LANDMARK LEFT  eval  At axilla  25.2  15 cm proximal to olecranon process   10 cm proximal to olecranon process 22.2  Olecranon process 21.8  15 cm proximal to ulnar styloid process   10 cm proximal to ulnar styloid process 19.1  Just proximal to ulnar styloid process 14.0  Across hand at thumb web space 18.3  At base of 2nd digit 5.9  (Blank rows = not tested)   FUNCTIONAL TESTS:    GAIT: WNL   QUICK DASH SURVEY:   BREAST COMPLAINTS QUESTIONNAIRE(EVAL) Pain:9 Heaviness:8 Swollen feeling:9 Tense Skin:7 Redness:0 Bra Print:10 Size of Pores:8 Hard feeling: 6 Total:   57  /80 A Score over 9 indicates lymphedema issues in the breast    BREAST COMPLAINTS QUESTIONNAIRE (12/03/2023) Pain:4 Heaviness:3 Swollen feeling:3 Tense Skin1 Redness:0 Bra Print:5 Size of Pores:0 Hard feeling:2  Total:    18  /80 A Score over 9 indicates lymphedema issues in the breast   TODAY'S TREATMENT:  01/09/24: Review of current HEP and status update Review of levator scap stretch Lacrosse ball against wall pinning against right levator scap trigger point Manual therapy: soft tissue mobilization to cervical and bil shoulder musculature Trigger Point Dry Needling Subsequent Treatment: Instructions provided previously at initial dry needling treatment.  Patient Verbal Consent Given: Yes Education Handout Provided: Previously Provided Muscles Treated: bil upper traps, bil levator scap, bil cervical multifidi marination,bil suboccipitals,  left pectorals, left teres in supine Electrical Stimulation Performed: No Treatment Response/Outcome:  improved soft tissue mobility and decreased tender point size and number  12/25/2023 Discussed progress toward goals for recert Wall arc x 4 B, wall slides x 5 for abd Overhead pulleys for abd x 3 min to loosen up MFR to left axillary region and upper arm Manual traction, suboccipital release STM bilateral UT, Left pectorals and lateral trunk with cocoa butter Measured AROM left shoulder   12/20/2023 LTR bilaterally x 3 ea with arms outstretched to stretch pectorals Manual traction, suboccipital release STM bilateral UT, posterior cervicals, levator, left pectorals, and lateral trunk with cocoa butter Supine cervical retractions x 10 into therapist hands for guidance Cupping with small clear cup to left lower rib area and left breast incision area after cocoa butter was applied Supine on half foam roll; Flexion, bilateral scaption, horizontal abduction bilaterally x 5 ea     12/12/23: Review of Chin tucks (pt demo good technique) 10x Discussed Suboccipital release with tennis balls Manual therapy: soft tissue mobilization to cervical and bil shoulder musculature Trigger Point Dry Needling Subsequent Treatment: Instructions provided previously at initial dry  needling treatment.  Patient Verbal Consent Given: Yes Education Handout Provided: Previously Provided  Muscles Treated: bil upper traps, bil levator scap, bil cervical multifidi marination,bil suboccipitals,  left pectorals, left teres in prone (good twitch) Electrical Stimulation Performed: No Treatment Response/Outcome:  improved soft tissue mobility and decreased tender point size and number 12/11/2023 Prayer stretch straight back and to the sides x 3 ea Cervical retractions x 10 Snow angels x 5 ea B LTR bilaterally x 3 ea with arms outstretched to stretch pectorals Manual traction, suboccipital release STM bilateral UT, posterior cervicals, levator, left pectorals, and lateral trunk with cocoa butter MFR left upper arm/axilla to area of cording                                                                                                                                      12/05/23: Specific ex's to lengthen upper cervical especially suboccipitals Chin tucks  Chin tuck with neck flexion Suboccipital release with tennis balls Manual therapy: soft tissue mobilization to cervical and bil shoulder musculature Trigger Point Dry Needling Subsequent Treatment: Instructions provided previously at initial dry needling treatment.  Patient Verbal Consent Given: Yes Education Handout Provided: Previously Provided Muscles Treated: bil upper traps, bil levator scap, bil cervical multifidi marination,bil suboccipitals,  left pectorals, left teres Electrical Stimulation Performed: No Treatment Response/Outcome:  improved soft tissue mobility and decreased tender point size and number  12/03/2023 Reviewed goals with pt for recert Wall slides flex and abd x 5 ea STM to bilateral UT,and Left lateral trunk and left scapular area with cocoa butter Supine wand flex and scaption x 5 LTR Bilaterally x 3 Tender to palpation axillary, clavicular, sternal and costal pectorals and lateral breast in region  of incision Most tender areas are pectorals as opposed to left breast. Discussed doing MLD for greater than 10 minutes for improved benefit   11/28/23: Manual therapy: soft tissue mobilization to cervical and bil shoulder musculature Trigger Point Dry Needling  Subsequent Treatment: Instructions provided previously at initial dry needling treatment.   Patient Verbal Consent Given: Yes Education Handout Provided: Previously Provided Muscles Treated: bil upper traps, bil levator scap, bil cervical multifidi marination, left pectorals, left teres Electrical Stimulation Performed: No Treatment Response/Outcome:  improved soft tissue mobility and decreased tender point size and number 11/26/2023 Prayer stretch x 3, prayer stretch with  quadruped hand walk to right to stretch lateral trunk x 3 Single arm wall stretch for pectorals x 3 B Supine on half foam roll 3 D flexion, scaption, horizontal abd STM to bilateral UT,and lateral trunk and left scapular area with cocoa butter Scar massage to lumpectomy incision followed by MLD MLD to lateral breast only today;Short neck, 5 diaphragmatic breaths, Left inguinal nodes and establishment of axillo-inguinal pathway,  then L lateral  breast moving fluid towards pathways ,,spending extra time in any areas of fibrosis then retracing all steps and ending with LN's. 11/21/23: Manual therapy: soft tissue mobilization to cervical and left shoulder musculature Trigger Point Dry  Needling Initial Treatment: Pt instructed on Dry Needling rational, procedures, and possible side effects. Pt instructed to expect mild to moderate muscle soreness later in the day and/or into the next day.  Pt instructed in methods to reduce muscle soreness. Pt instructed to continue prescribed HEP. Because Dry Needling was performed over or adjacent to a lung field, pt was educated on S/S of pneumothorax and to seek immediate medical attention should they occur.  Patient was educated  on signs and symptoms of infection and other risk factors and advised to seek medical attention should they occur.  Patient verbalized understanding of these instructions and education.   Patient Verbal Consent Given: Yes Education Handout Provided: Yes Muscles Treated: bil upper traps, bil levator scap, bil cervical multifidi marination, left pectorals, left teres Electrical Stimulation Performed: No Treatment Response/Outcome: soreness right > left initially; pt reports she can "feel it release" in cervical musculature during marination; improved soft tissue mobility and decreased tender point size and number   PATIENT EDUCATION:  Education details: Discussed POC, treatment interventions. Practiced stargazer stretch and LTR with arms extended to start pectoral stretching and to do at home Person educated: Patient Education method: Explanation Education comprehension: verbalized understanding and returned demonstration  HOME EXERCISE PROGRAM: To start back to stargazer and LTR to stretch pectorals  ASSESSMENT:  CLINICAL IMPRESSION: Right levator scap tender point with good response to DN.  Left pectoral muscle tenderness with palpation and DN but improved soft tissue length note.  The patient was encouraged in regular performance of HEP post DN including soft tissue lengthening and strengthening exercises to enhance long term benefit.    OBJECTIVE IMPAIRMENTS: decreased activity tolerance, decreased knowledge of condition, decreased ROM, decreased strength, increased edema, impaired UE functional use, postural dysfunction, and pain.   ACTIVITY LIMITATIONS: carrying, sleeping, reach over head, and hygiene/grooming  PARTICIPATION LIMITATIONS:  pt is doing all but with some breast pain/discomfort, limitations in left shoulder ROM  PERSONAL FACTORS:  3+ comorbidities: Left breast cancer s/p infusions and radiation  are also affecting patient's functional outcome.    REHAB POTENTIAL:  Excellent  CLINICAL DECISION MAKING: Stable/uncomplicated  EVALUATION COMPLEXITY: Low  GOALS: Goals reviewed with patient? Yes  SHORT TERM GOALS=LONG TERM GOALS: Target date: 12/02/2022    Pt will be independent in self MLD for reducing left breast edema Baseline: Goal status: MET 12/03/2023  2.  Pt will note decreased breast swelling/tenderness by atleast 50% Baseline:  Goal status: MET 12/25/2023  3.  Pt will be able to lie on her left side to sleep for short periods of time Baseline:  Goal status: IN progress   4.  Pt will have left shoulder ROM  flex and abd Within 5-10 degrees of right without increased pain  for improved reaching Baseline:R  flex 165, abd 180 Goal status: MET flexion 11/08/2023, In Progress abd; regressed since last note  5.  Breast complaints survey will be reduced to no greater than 20 Baseline: 57 Goal status: MET 12/03/2023  6  Pt will have decreased neck/upper quarter pain by 50% overall  Goal Status: NEW    PLAN:  PT FREQUENCY:1x/week  PT DURATION: 4 weeks  PLANNED INTERVENTIONS: 97164- PT Re-evaluation, 97110-Therapeutic exercises, 97112- Neuromuscular re-education, 97535- Self Care, 41324- Manual therapy, 97760- Orthotic Fit/training, 97016- Vasopneumatic device, Patient/Family education, Manual lymph drainage, Scar mobilization, Therapeutic exercises, Therapeutic activity, Neuromuscular re-education, and Self Care, Dry needling.  PLAN FOR NEXT SESSION:continue DN, Progress strength, SOZO screen before DC,STM left UQ,  MFR cording  left,update HEP with stretches, PROM left, Left breast MLD  Lavinia Sharps, PT 01/09/24 6:38 PM Phone: [phone] Fax: [phone]

## 2024-01-10 ENCOUNTER — Telehealth: Payer: Self-pay | Admitting: *Deleted

## 2024-01-10 LAB — SIGNATERA ONLY (NATERA MANAGED)
SIGNATERA MTM READOUT: 0 MTM/ml
SIGNATERA TEST RESULT: NEGATIVE

## 2024-01-10 NOTE — Telephone Encounter (Signed)
Per MD request, RN placed call to pt regarding negative (Not Detected) recent [employer] testing.  Pt appreciative of call and verbalized understanding.  

## 2024-01-14 ENCOUNTER — Ambulatory Visit: Payer: 59 | Attending: Hematology and Oncology

## 2024-01-14 DIAGNOSIS — R252 Cramp and spasm: Secondary | ICD-10-CM | POA: Insufficient documentation

## 2024-01-14 DIAGNOSIS — R293 Abnormal posture: Secondary | ICD-10-CM | POA: Diagnosis present

## 2024-01-14 DIAGNOSIS — C50412 Malignant neoplasm of upper-outer quadrant of left female breast: Secondary | ICD-10-CM | POA: Insufficient documentation

## 2024-01-14 DIAGNOSIS — Z483 Aftercare following surgery for neoplasm: Secondary | ICD-10-CM | POA: Diagnosis present

## 2024-01-14 DIAGNOSIS — I890 Lymphedema, not elsewhere classified: Secondary | ICD-10-CM | POA: Insufficient documentation

## 2024-01-14 DIAGNOSIS — M25612 Stiffness of left shoulder, not elsewhere classified: Secondary | ICD-10-CM | POA: Insufficient documentation

## 2024-01-14 NOTE — Therapy (Signed)
 OUTPATIENT PHYSICAL THERAPY  UPPER EXTREMITY ONCOLOGY PROGRESS NOTE  Patient Name: Caitlyn Torres MRN: [id number] DOB:02-26-1961, 63 y.o., female Today's Date: 01/14/2024  END OF SESSION:  PT End of Session - 01/14/24 1402     Visit Number 18    Number of Visits 22    Date for PT Re-Evaluation 02/11/24    Authorization Type [employer]    PT Start Time 1402    PT Stop Time 1501    PT Time Calculation (min) 59 min    Activity Tolerance Patient tolerated treatment well    Behavior During Therapy [facility] for tasks assessed/performed             Past Medical History:  Diagnosis Date   Anxiety    Arthritis    Cancer (HCC) 03/2022   L breast   Depression    Family history of breast cancer 05/15/2022   Family history of pancreatic cancer 05/15/2022   Family history of prostate cancer 05/15/2022   GERD (gastroesophageal reflux disease)    History of hiatal hernia    patient believes she may have been told she has this years ago   Past Surgical History:  Procedure Laterality Date   BREAST BIOPSY  09/19/2022   MM LT RADIOACTIVE SEED EA ADD LESION LOC MAMMO GUIDE 09/19/2022 [facility]   BREAST BIOPSY  09/19/2022   MM LT RADIOACTIVE SEED LOC MAMMO GUIDE 09/19/2022 [facility]   BREAST LUMPECTOMY WITH RADIOACTIVE SEED AND AXILLARY LYMPH NODE DISSECTION Left 09/20/2022   Procedure: LEFT BREAST SEED LUMPECTOMY, LEFT AXILLARY LYMPH NODE DISSECTION;  Surgeon: Harriette Bouillon, MD;  Location: [facility] OR;  Service: General;  Laterality: Left;  GEN & PEC BLOCK   CERVIX SURGERY     Laser removal of pre-cancer cells   LASIK Bilateral    PORTACATH PLACEMENT Right 04/20/2022   Procedure: PORT PLACEMENT;  Surgeon: Harriette Bouillon, MD;  Location: [facility] OR;  Service: General;  Laterality: Right;   Patient Active Problem List   Diagnosis Date Noted   Other fatigue 08/28/2022   Acute pain 08/28/2022   Genetic testing 06/01/2022   Family history of breast cancer 05/15/2022   Family history of  pancreatic cancer 05/15/2022   Family history of prostate cancer 05/15/2022   Port-A-Cath in place 04/28/2022   Malignant neoplasm of upper-outer quadrant of left breast in female, estrogen receptor positive (HCC) 03/29/2022    PCP:   REFERRING PROVIDER: [name], MD   REFERRING DIAG: Left Breast lymphedema/cording  THERAPY DIAG:  Malignant neoplasm of upper-outer quadrant of left female breast, unspecified estrogen receptor status (HCC)  Stiffness of left shoulder, not elsewhere classified  Cramp and spasm  Aftercare following surgery for neoplasm  Lymphedema of breast  Abnormal posture  ONSET DATE: March but exacerbated end of November  Rationale for Evaluation and Treatment: Rehabilitation  SUBJECTIVE:  SUBJECTIVE STATEMENT:   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 Its been very busy and stressful. I was really sore after last visit, but I expected to be. I feel like I have better ROM now in my neck. I haven't done as much MLD because I have been out of town a lot, and driving a lot so my left breast is more swollen and tender. I feel like therapy is helping and my overall pain is better, but not yet 50% better in neck.  EVAL I was using the Flexitouch 2 times per week and over time I noticed my breast swelling got worse. I have started doing some manual  lymphatic drainage as well and have been using the machine 4-5 times per week. It has gotten better. I have some cording issues too with some tightness. My breast has been sore at the top of my chest, and medial inferior side, and lateral side.  I have still been wearing the pad in my bra. My breast feels heavy and when I take my bra off it is painful.  I got some new compression bras before Thanksgiving, and  one of them is the Wear Ease. I sleep in it every night. I got some cami's and sport bra's  PERTINENT HISTORY:  03/14/2022:Screening detected left breast masses 1.5 cm and 1.3 cm with enlarged left axillary lymph node, biopsy revealed grade 3 IDC with lymph node being positive, ER 95%, PR 0%, HER2 2+ by IHC and FISH positive with a ratio 2.45 and a copy #4.9, the second biopsy was HER2 negative   1. Neoadjuvant chemotherapy with Herceptin Perjeta and letrozole for 8 cycles.  (Our original recommendation is TCHP x6 cycles but patient is not willing to go through chemo) 2. left lumpectomy and ALND 09/20/2022: 0.6 cm IDC 2/14 lymph nodes, margins negative, ER 95%, PR 0%, HER2 positive 3. Followed by adjuvant radiation therapy  4.  Adjuvant antiestrogen therapy with neratinib She had left lumpectomy on 09/20/2022 with 2/14 LN's  PAIN:  Are you having pain? Yes NPRS scale: 4/10 right > left upper traps, 2/10 left breast. Pain location: left breast, lateral, and left pectorals Pain orientation: Left and Upper  PAIN TYPE: aching, tight, and sore, heavy Pain description: constant  Aggravating factors: removing bra, movement, walking, jumping,sleeping on left Relieving factors: compression, MLD  PRECAUTIONS: Left UE lymphedema precautions   RED FLAGS: None   WEIGHT BEARING RESTRICTIONS: No  FALLS:  Has patient fallen in last 6 months? 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  COGNITION: Overall cognitive status: Within functional limits for tasks assessed   PALPATION: Tender medial, lateral and proximal left breast, left UT, lats,  pectorals  OBSERVATIONS / OTHER ASSESSMENTS:generalized left breast swelling, mild fibrosis near lateral breast incision with tenderness, very few enlarged pores, cording noted left axillary region running below axillary incision to elbow region  SENSATION: Light touch: Deficits    POSTURE:  forward head, rounded shoulders   UPPER EXTREMITY AROM/PROM:  A/PROM RIGHT   eval   Shoulder extension 57  Shoulder flexion 165  Shoulder abduction 180  Shoulder internal rotation 65  Shoulder external rotation  97    (Blank rows = not tested)  A/PROM LEFT   eval LEFT 11/08/2023 LEFT 12/03/2023 LEFT 12/25/2023  Shoulder extension 50     Shoulder flexion 149 160 160 161  Shoulder abduction 154 168 169 166  Shoulder internal rotation 48  +     Shoulder external rotation 91       (Blank rows = not tested)  CERVICAL AROM: All within normal limits:   UPPER EXTREMITY STRENGTH:   LYMPHEDEMA ASSESSMENTS:   SURGERY TYPE/DATE:  Left lumpectomy,09/20/2022   NUMBER OF LYMPH NODES REMOVED: 2+/14  CHEMOTHERAPY: No at her request, Did Herceptin, Perjeta and letrozole   RADIATION:Yes, but stopped early at her request  HORMONE TREATMENT: YES  INFECTIONS: NO   LYMPHEDEMA ASSESSMENTS:   LANDMARK RIGHT  eval  At axilla  25.2  15 cm proximal to olecranon process   10 cm proximal to olecranon process 22.7  Olecranon process 21.6  15 cm proximal to ulnar styloid process   10 cm proximal to ulnar styloid process 20.3  Just proximal to ulnar styloid process 14.2  Across hand at thumb web space 19.3  At base of 2nd digit 5.7  (Blank rows = not tested)  LANDMARK LEFT  eval  At axilla  25.2  15 cm proximal to olecranon process   10 cm proximal to olecranon process 22.2  Olecranon process 21.8  15 cm proximal to ulnar styloid process   10 cm proximal to ulnar styloid process 19.1  Just proximal to ulnar styloid process 14.0  Across hand at thumb web space 18.3  At base of 2nd digit  5.9  (Blank rows = not tested)   FUNCTIONAL TESTS:    GAIT: WNL   QUICK DASH SURVEY:   BREAST COMPLAINTS QUESTIONNAIRE(EVAL) Pain:9 Heaviness:8 Swollen feeling:9 Tense Skin:7 Redness:0 Bra Print:10 Size of Pores:8 Hard feeling: 6 Total:   57  /80 A Score over 9 indicates lymphedema issues in the breast    BREAST COMPLAINTS QUESTIONNAIRE (12/03/2023) Pain:4 Heaviness:3 Swollen feeling:3 Tense Skin1 Redness:0 Bra Print:5 Size of Pores:0 Hard feeling:2  Total:   18  /80 A Score over 9 indicates lymphedema issues in the breast  L-DEX FLOWSHEETS - 01/14/24 1400       L-DEX LYMPHEDEMA SCREENING   Measurement Type Unilateral    L-DEX MEASUREMENT EXTREMITY Upper Extremity    POSITION  Standing    DOMINANT SIDE Right    At Risk Side Left    BASELINE SCORE (UNILATERAL) -2.1    L-DEX SCORE (UNILATERAL) -4.1    VALUE CHANGE (UNILAT) -2              TODAY'S TREATMENT:   01/14/2024 Performed SOZO screen;well WNL Manual traction, suboccipital release, STM bilateral UT/levator/posterior cervicals with cocoa butter In supine: Short neck, 5 diaphragmatic breaths, R axillary nodes and establishment of interaxillary pathway, L inguinal nodes and establishment of axilloinguinal pathway, then L breast moving fluid towards pathways spending extra time in any areas of fibrosis then retracing all steps in supine and briefly in Right SL to left lateral breast/trunk and left axillo-inguinal pathway, ending with LN"s Discussed overall progress 01/09/24: Review of current HEP and status update Review of levator scap stretch Lacrosse ball against wall pinning against right levator scap trigger point Manual therapy: soft tissue mobilization to cervical and bil shoulder musculature Trigger Point Dry Needling Subsequent Treatment: Instructions provided previously at initial dry needling treatment.  Patient Verbal Consent Given: Yes Education Handout Provided: Previously  Provided Muscles  Treated: bil upper traps, bil levator scap, bil cervical multifidi marination,bil suboccipitals,  left pectorals, left teres in supine Electrical Stimulation Performed: No Treatment Response/Outcome:  improved soft tissue mobility and decreased tender point size and number  12/25/2023 Discussed progress toward goals for recert Wall arc x 4 B, wall slides x 5 for abd Overhead pulleys for abd x 3 min to loosen up MFR to left axillary region and upper arm Manual traction, suboccipital release STM bilateral UT, Left pectorals and lateral trunk with cocoa butter Measured AROM left shoulder   12/20/2023 LTR bilaterally x 3 ea with arms outstretched to stretch pectorals Manual traction, suboccipital release STM bilateral UT, posterior cervicals, levator, left pectorals, and lateral trunk with cocoa butter Supine cervical retractions x 10 into therapist hands for guidance Cupping with small clear cup to left lower rib area and left breast incision area after cocoa butter was applied Supine on half foam roll; Flexion, bilateral scaption, horizontal abduction bilaterally x 5 ea     12/12/23: Review of Chin tucks (pt demo good technique) 10x Discussed Suboccipital release with tennis balls Manual therapy: soft tissue mobilization to cervical and bil shoulder musculature Trigger Point Dry Needling Subsequent Treatment: Instructions provided previously at initial dry needling treatment.  Patient Verbal Consent Given: Yes Education Handout Provided: Previously Provided Muscles Treated: bil upper traps, bil levator scap, bil cervical multifidi marination,bil suboccipitals,  left pectorals, left teres in prone (good twitch) Electrical Stimulation Performed: No Treatment Response/Outcome:  improved soft tissue mobility and decreased tender point size and number 12/11/2023 Prayer stretch straight back and to the sides x 3 ea Cervical retractions x 10 Snow angels x 5 ea B LTR  bilaterally x 3 ea with arms outstretched to stretch pectorals Manual traction, suboccipital release STM bilateral UT, posterior cervicals, levator, left pectorals, and lateral trunk with cocoa butter MFR left upper arm/axilla to area of cording                                                                                                                                      12/05/23: Specific ex's to lengthen upper cervical especially suboccipitals Chin tucks  Chin tuck with neck flexion Suboccipital release with tennis balls Manual therapy: soft tissue mobilization to cervical and bil shoulder musculature Trigger Point Dry Needling Subsequent Treatment: Instructions provided previously at initial dry needling treatment.  Patient Verbal Consent Given: Yes Education Handout Provided: Previously Provided Muscles Treated: bil upper traps, bil levator scap, bil cervical multifidi marination,bil suboccipitals,  left pectorals, left teres Electrical Stimulation Performed: No Treatment Response/Outcome:  improved soft tissue mobility and decreased tender point size and number  12/03/2023 Reviewed goals with pt for recert Wall slides flex and abd x 5 ea STM to bilateral UT,and Left lateral trunk and left scapular area with cocoa butter Supine wand flex and scaption x 5 LTR Bilaterally x 3 Tender to palpation axillary, clavicular, sternal and  costal pectorals and lateral breast in region of incision Most tender areas are pectorals as opposed to left breast. Discussed doing MLD for greater than 10 minutes for improved benefit   11/28/23: Manual therapy: soft tissue mobilization to cervical and bil shoulder musculature Trigger Point Dry Needling  Subsequent Treatment: Instructions provided previously at initial dry needling treatment.   Patient Verbal Consent Given: Yes Education Handout Provided: Previously Provided Muscles Treated: bil upper traps, bil levator scap, bil cervical multifidi  marination, left pectorals, left teres Electrical Stimulation Performed: No Treatment Response/Outcome:  improved soft tissue mobility and decreased tender point size and number 11/26/2023 Prayer stretch x 3, prayer stretch with  quadruped hand walk to right to stretch lateral trunk x 3 Single arm wall stretch for pectorals x 3 B Supine on half foam roll 3 D flexion, scaption, horizontal abd STM to bilateral UT,and lateral trunk and left scapular area with cocoa butter Scar massage to lumpectomy incision followed by MLD MLD to lateral breast only today;Short neck, 5 diaphragmatic breaths, Left inguinal nodes and establishment of axillo-inguinal pathway,  then L lateral  breast moving fluid towards pathways ,,spending extra time in any areas of fibrosis then retracing all steps and ending with LN's. 11/21/23: Manual therapy: soft tissue mobilization to cervical and left shoulder musculature Trigger Point Dry Needling Initial Treatment: Pt instructed on Dry Needling rational, procedures, and possible side effects. Pt instructed to expect mild to moderate muscle soreness later in the day and/or into the next day.  Pt instructed in methods to reduce muscle soreness. Pt instructed to continue prescribed HEP. Because Dry Needling was performed over or adjacent to a lung field, pt was educated on S/S of pneumothorax and to seek immediate medical attention should they occur.  Patient was educated on signs and symptoms of infection and other risk factors and advised to seek medical attention should they occur.  Patient verbalized understanding of these instructions and education.   Patient Verbal Consent Given: Yes Education Handout Provided: Yes Muscles Treated: bil upper traps, bil levator scap, bil cervical multifidi marination, left pectorals, left teres Electrical Stimulation Performed: No Treatment Response/Outcome: soreness right > left initially; pt reports she can "feel it release" in cervical  musculature during marination; improved soft tissue mobility and decreased tender point size and number   PATIENT EDUCATION:  Education details: Discussed POC, treatment interventions. Practiced stargazer stretch and LTR with arms extended to start pectoral stretching and to do at home Person educated: Patient Education method: Explanation Education comprehension: verbalized understanding and returned demonstration  HOME EXERCISE PROGRAM: To start back to stargazer and LTR to stretch pectorals  ASSESSMENT:  CLINICAL IMPRESSION: Pt has had to travel a lot for work and has had limited time to perform MLD to the left breast.  She has only had 2 visits in this certification period. Her breast  has been more swollen and tender over the last week or so. She continues with tightness in cervical region  especially levator, UT and suboccipitals. She has been getting benefit with DN and manual work. She will continue to benefit from skilled therapy to address remaining deficits.   OBJECTIVE IMPAIRMENTS: decreased activity tolerance, decreased knowledge of condition, decreased ROM, decreased strength, increased edema, impaired UE functional use, postural dysfunction, and pain.   ACTIVITY LIMITATIONS: carrying, sleeping, reach over head, and hygiene/grooming  PARTICIPATION LIMITATIONS:  pt is doing all but with some breast pain/discomfort, limitations in left shoulder ROM  PERSONAL FACTORS:  3+ comorbidities: Left breast cancer  s/p infusions and radiation  are also affecting patient's functional outcome.    REHAB POTENTIAL: Excellent  CLINICAL DECISION MAKING: Stable/uncomplicated  EVALUATION COMPLEXITY: Low  GOALS: Goals reviewed with patient? Yes  SHORT TERM GOALS=LONG TERM GOALS: Target date: 12/02/2022    Pt will be independent in self MLD for reducing left breast edema Baseline: Goal status: MET 12/03/2023  2.  Pt will note decreased breast swelling/tenderness by atleast 50% Baseline:   Goal status: MET 12/25/2023  3.  Pt will be able to lie on her left side to sleep for short periods of time Baseline:  Goal status: IN progress   4.  Pt will have left shoulder ROM  flex and abd Within 5-10 degrees of right without increased pain  for improved reaching Baseline:R  flex 165, abd 180 Goal status: MET flexion 11/08/2023, In Progress abd; regressed since last note  5.  Breast complaints survey will be reduced to no greater than 20 Baseline: 57 Goal status: MET 12/03/2023  6  Pt will have decreased neck/upper quarter pain by 50% overall  Goal Status: In Progress   PLAN:  PT FREQUENCY:1x/week  PT DURATION: 4 weeks  PLANNED INTERVENTIONS: 97164- PT Re-evaluation, 97110-Therapeutic exercises, 97112- Neuromuscular re-education, 97535- Self Care, 40981- Manual therapy, 97760- Orthotic Fit/training, 97016- Vasopneumatic device, Patient/Family education, Manual lymph drainage, Scar mobilization, Therapeutic exercises, Therapeutic activity, Neuromuscular re-education, and Self Care, Dry needling.  PLAN FOR NEXT SESSION:continue DN, Progress strength, ,STM left UQ,  MFR cording left,update HEP with stretches, PROM left, Left breast MLD  Alvira Monday, PT 01/14/24 4:23 PM Phone: [phone] Fax: [phone]

## 2024-01-15 ENCOUNTER — Other Ambulatory Visit: Payer: Self-pay

## 2024-01-22 ENCOUNTER — Inpatient Hospital Stay: Payer: 59 | Admitting: Hematology and Oncology

## 2024-01-22 ENCOUNTER — Inpatient Hospital Stay: Payer: 59 | Attending: Hematology and Oncology

## 2024-01-22 VITALS — BP 140/72 | HR 64 | Temp 98.4°F | Resp 18 | Ht 63.0 in | Wt 123.1 lb

## 2024-01-22 DIAGNOSIS — Z170 Estrogen receptor positive status [ER+]: Secondary | ICD-10-CM | POA: Insufficient documentation

## 2024-01-22 DIAGNOSIS — R5383 Other fatigue: Secondary | ICD-10-CM | POA: Diagnosis not present

## 2024-01-22 DIAGNOSIS — M85859 Other specified disorders of bone density and structure, unspecified thigh: Secondary | ICD-10-CM | POA: Insufficient documentation

## 2024-01-22 DIAGNOSIS — Z9221 Personal history of antineoplastic chemotherapy: Secondary | ICD-10-CM | POA: Insufficient documentation

## 2024-01-22 DIAGNOSIS — C50412 Malignant neoplasm of upper-outer quadrant of left female breast: Secondary | ICD-10-CM | POA: Diagnosis present

## 2024-01-22 DIAGNOSIS — Z79811 Long term (current) use of aromatase inhibitors: Secondary | ICD-10-CM | POA: Insufficient documentation

## 2024-01-22 DIAGNOSIS — Z923 Personal history of irradiation: Secondary | ICD-10-CM | POA: Insufficient documentation

## 2024-01-22 LAB — CBC WITH DIFFERENTIAL (CANCER CENTER ONLY)
Abs Immature Granulocytes: 0.02 10*3/uL (ref 0.00–0.07)
Basophils Absolute: 0 10*3/uL (ref 0.0–0.1)
Basophils Relative: 1 %
Eosinophils Absolute: 0.1 10*3/uL (ref 0.0–0.5)
Eosinophils Relative: 3 %
HCT: 40 % (ref 36.0–46.0)
Hemoglobin: 13.6 g/dL (ref 12.0–15.0)
Immature Granulocytes: 1 %
Lymphocytes Relative: 28 %
Lymphs Abs: 0.9 10*3/uL (ref 0.7–4.0)
MCH: 29.3 pg (ref 26.0–34.0)
MCHC: 34 g/dL (ref 30.0–36.0)
MCV: 86.2 fL (ref 80.0–100.0)
Monocytes Absolute: 0.4 10*3/uL (ref 0.1–1.0)
Monocytes Relative: 12 %
Neutro Abs: 1.8 10*3/uL (ref 1.7–7.7)
Neutrophils Relative %: 55 %
Platelet Count: 162 10*3/uL (ref 150–400)
RBC: 4.64 MIL/uL (ref 3.87–5.11)
RDW: 14.5 % (ref 11.5–15.5)
WBC Count: 3.3 10*3/uL — ABNORMAL LOW (ref 4.0–10.5)
nRBC: 0 % (ref 0.0–0.2)

## 2024-01-22 LAB — CMP (CANCER CENTER ONLY)
ALT: 21 U/L (ref 0–44)
AST: 23 U/L (ref 15–41)
Albumin: 4.6 g/dL (ref 3.5–5.0)
Alkaline Phosphatase: 49 U/L (ref 38–126)
Anion gap: 5 (ref 5–15)
BUN: 14 mg/dL (ref 6–20)
CO2: 31 mmol/L (ref 22–32)
Calcium: 9.5 mg/dL (ref 8.9–10.3)
Chloride: 101 mmol/L (ref 98–111)
Creatinine: 0.66 mg/dL (ref 0.44–1.00)
GFR, Estimated: >60 mL/min (ref >60)
Glucose, Bld: 89 mg/dL (ref 70–99)
Potassium: 4.1 mmol/L (ref 3.5–5.1)
Sodium: 137 mmol/L (ref 135–145)
Total Bilirubin: 0.4 mg/dL (ref 0.0–1.2)
Total Protein: 7.1 g/dL (ref 6.5–8.1)

## 2024-01-22 NOTE — Progress Notes (Signed)
 Patient Care Team: Melida Quitter, PA as PCP - General (Family Medicine) [name], MD as Consulting Physician (Hematology and Oncology) Dorothy Puffer, MD as Consulting Physician (Radiation Oncology) Harriette Bouillon, MD as Consulting Physician (General Surgery)  DIAGNOSIS:  Encounter Diagnosis  Name Primary?   Malignant neoplasm of upper-outer quadrant of left breast in female, estrogen receptor positive (HCC) Yes    SUMMARY OF ONCOLOGIC HISTORY: Oncology History  Malignant neoplasm of upper-outer quadrant of left breast in female, estrogen receptor positive (HCC)  03/14/2022 Initial Diagnosis   Screening detected left breast masses 1.5 cm and 1.3 cm with enlarged left axillary lymph node, biopsy revealed grade 3 IDC with lymph node being positive, ER 95%, PR 0%, HER2 2+ by IHC and FISH positive with a ratio 2.45 and a copy #4.9, the second biopsy was HER2 negative with a ratio 2.15 and a copy #3.55   03/29/2022 Cancer Staging   Staging form: Breast, AJCC 8th Edition - Clinical: Stage IIA (cT1c, cN1, cM0, G3, ER+, PR-, HER2+) - Signed by [name], MD on 03/29/2022 Stage prefix: Initial diagnosis Histologic grading system: 3 grade system   03/29/2022 - 09/2022 Anti-estrogen oral therapy   Letrozole daily   04/21/2022 -  Chemotherapy   Herceptin/Perjeta every 21 days initially x 3, will reassess with MRI and then will decide whether to continue or to add chemo.  (Original recommendation was TCHP x 6)      Genetic Testing   [facility] CancerNext-Expanded Panel was Negative. Report date is 05/30/2022.  The CancerNext-Expanded gene panel offered by [facility] and includes sequencing, rearrangement, and RNA analysis for the following 77 genes: AIP, ALK, APC, ATM, AXIN2, BAP1, BARD1, BLM, BMPR1A, BRCA1, BRCA2, BRIP1, CDC73, CDH1, CDK4, CDKN1B, CDKN2A, CHEK2, CTNNA1, DICER1, FANCC, FH, FLCN, GALNT12, KIF1B, LZTR1, MAX, MEN1, MET, MLH1, MSH2, MSH3, MSH6, MUTYH, NBN, NF1, NF2, NTHL1,  PALB2, PHOX2B, PMS2, POT1, PRKAR1A, PTCH1, PTEN, RAD51C, RAD51D, RB1, RECQL, RET, SDHA, SDHAF2, SDHB, SDHC, SDHD, SMAD4, SMARCA4, SMARCB1, SMARCE1, STK11, SUFU, TMEM127, TP53, TSC1, TSC2, VHL and XRCC2 (sequencing and deletion/duplication); EGFR, EGLN1, HOXB13, KIT, MITF, PDGFRA, POLD1, and POLE (sequencing only); EPCAM and GREM1 (deletion/duplication only).    04/21/2022 - 09/14/2022 Chemotherapy   Given neoadjuvantly with Letrozole (declined neoadjuvant chemotherapy with TCHP) Patient is on Treatment Plan : BREAST Trastuzumab  + Pertuzumab q21d x 13 cycles      09/20/2022 Surgery   left lumpectomy and ALND: 0.6 cm IDC 2/14 lymph nodes, margins negative, ER 95%, PR 0%, HER2 positive   10/13/2022 -  Chemotherapy   Patient is on Treatment Plan : BREAST ADO-Trastuzumab Emtansine (Kadcyla) q21d     11/07/2022 - 12/06/2022 Radiation Therapy   Site/dose:   The patient had a prescriptive dose of 50.4 Gy in 28 fractions to the breast and SCLV region using a 4-field approach. This was to be delivered using a 3-D conformal technique. The patient did not receive the planned boost over 10 Gy in 5 fractions.  In total, she completed 36 Gy in 20 of the 33 planned fractions.   02/2023 -  Anti-estrogen oral therapy   Anastrozole daily     CHIEF COMPLIANT: Follow-up on anastrozole therapy  HISTORY OF PRESENT ILLNESS:   History of Present Illness The pain is not relieved by physical therapy, massage, or dry needling. The patient reports that the pain is most severe after sleeping and is a constant annoyance throughout the day. The patient also reports fatigue and low energy, which she has been  managing with various supplements. The patient has noticed some improvement in energy levels since starting a new supplement regimen. The patient also has a history of depression, which has improved recently.     ALLERGIES:  is allergic to aleve [naproxen sodium], aspirin, macrobid [nitrofurantoin], motrin [ibuprofen],  and tylenol [acetaminophen].  MEDICATIONS:  Current Outpatient Medications  Medication Sig Dispense Refill   anastrozole (ARIMIDEX) 1 MG tablet Take 1 tablet (1 mg total) by mouth daily. 90 tablet 3   Brimonidine Tartrate (LUMIFY) 0.025 % SOLN Place 1 drop into both eyes daily as needed (red eyes).     carboxymethylcellulose (REFRESH PLUS) 0.5 % SOLN Place 1 drop into both eyes 3 (three) times daily as needed (dry eyes).     cetirizine (ZYRTEC) 10 MG tablet Take 10 mg by mouth daily.     estradiol (ESTRACE) 0.1 MG/GM vaginal cream 1 gram placed in vagina 2-3 times weekly.     lidocaine (XYLOCAINE) 2 % solution Use as directed 15 mLs in the mouth or throat every 4 (four) hours as needed for mouth pain. (Patient not taking: Reported on 07/25/2023) 100 mL 0   Multiple Vitamins-Iron (CHLORELLA) CAPS Take 1 capsule by mouth daily.     OVER THE COUNTER MEDICATION Take 1 capsule by mouth daily. Amla     OVER THE COUNTER MEDICATION Take 1 capsule by mouth daily. Mushroom Complex     OVER THE COUNTER MEDICATION Place 1 Application vaginally daily as needed (moisture). Femininity     QUERCETIN PO Take 1 capsule by mouth daily.     TURMERIC CURCUMIN PO Take 1 capsule by mouth daily.     No current facility-administered medications for this visit.    PHYSICAL EXAMINATION: ECOG PERFORMANCE STATUS: 1 - Symptomatic but completely ambulatory  Vitals:   01/22/24 0915  BP: (!) 140/72  Pulse: 64  Resp: 18  Temp: 98.4 F (36.9 C)  SpO2: 100%   Filed Weights   01/22/24 0915  Weight: 123 lb 1.6 oz (55.8 kg)      LABORATORY DATA:  I have reviewed the data as listed    Latest Ref Rng & Units 07/25/2023    9:44 AM 05/15/2023    8:39 AM 04/24/2023    8:49 AM  CMP  Glucose 70 - 99 mg/dL 79  88  99   BUN 6 - 20 mg/dL 13  11  12    Creatinine 0.44 - 1.00 mg/dL 1.61  0.96  0.45   Sodium 135 - 145 mmol/L 140  139  136   Potassium 3.5 - 5.1 mmol/L 4.2  3.9  4.0   Chloride 98 - 111 mmol/L 105  106  103    CO2 22 - 32 mmol/L 29  28  28    Calcium 8.9 - 10.3 mg/dL 40.9  9.1  81.1   Total Protein 6.5 - 8.1 g/dL 7.6  6.6  7.5   Total Bilirubin 0.3 - 1.2 mg/dL 0.5  0.4  0.4   Alkaline Phos 38 - 126 U/L 57  59  61   AST 15 - 41 U/L 26  42  37   ALT 0 - 44 U/L 20  32  31     Lab Results  Component Value Date   WBC 3.3 (L) 01/22/2024   HGB 13.6 01/22/2024   HCT 40.0 01/22/2024   MCV 86.2 01/22/2024   PLT 162 01/22/2024   NEUTROABS 1.8 01/22/2024    ASSESSMENT & PLAN:  Malignant neoplasm of upper-outer  quadrant of left breast in female, 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   Treatment plan based on multidisciplinary tumor board: 1. Neoadjuvant chemotherapy with Herceptin Perjeta and letrozole for 8 cycles.  (Our original recommendation is TCHP x6 cycles but patient is not willing to go through chemo) 2. left lumpectomy and ALND 09/20/2022: 0.6 cm IDC 2/14 lymph nodes, margins negative, ER 95%, PR 0%, HER2 positive 3. Followed by adjuvant radiation therapy 11/07/22-12/06/22  4.  Adjuvant Kadcyla completed 05/15/2023 5. Adjuvant antiestrogen therapy anastrozole started April 2024 5.  Consideration for neratinib (patient refused) ------------------------------------------------------------------------------------------   Anastrozole Toxicities:  Fatigue:  Feelings of depression Generalized body aches and pains especially in the neck: She has used acupuncture, massage therapy, dry needling but her symptoms are continuing to bother her.  I recommended discontinuation of anastrozole therapy and following up with her in 1 month to see if her symptoms improved.  If they did improve  then we can talk about switching her to letrozole.   Breast cancer surveillance: Breast exam 01/22/2024: Benign Mammograms will need to be done Signatera: Negative  Telephone visit in 1 month to discuss her symptoms after stopping anastrozole. ------------------------------------- Assessment and Plan Assessment & Plan Musculoskeletal pain secondary to anastrozole Persistent musculoskeletal pain likely due to anastrozole. Current interventions provide temporary relief. Discontinuation proposed to evaluate causality. - Discontinue anastrozole to assess if it is the cause of the musculoskeletal pain. - Reassess in one month to determine if symptoms improve after discontinuation of anastrozole. - Consider alternative medications if pain improves, including letrozole, exemestane, and tamoxifen.  Breast cancer surveillance Estrogen receptor-positive breast cancer. Recent HER scan ultrasound normal. Surveillance to include mammograms, thermoscans, and ultrasounds. - Discuss breast cancer surveillance strategy, including the use of mammograms, thermoscans, and ultrasounds. - Consider scheduling a mammogram next year as part of the surveillance plan.  Osteopenia Osteopenia with hip T-score of -1.5. Spine T-score of +1.9. No pharmacological intervention needed. - Recommend exercise and vitamin D supplementation to support bone health.  Mood changes Mood changes noted, possibly related to anastrozole. Monitoring impact of discontinuation on mood. - Monitor mood changes in relation to discontinuation of anastrozole to assess if there is an improvement.      No orders of the defined types were placed in this encounter.  The patient has a good understanding of the overall plan. she agrees with it. she will call with any problems that may develop before the next visit here. Total time spent: 30 mins including face to face time and time spent for planning, charting and co-ordination of care    Tamsen Meek, MD 01/22/24

## 2024-01-22 NOTE — Assessment & Plan Note (Signed)
 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   Treatment plan based on multidisciplinary tumor board: 1. Neoadjuvant chemotherapy with Herceptin Perjeta and letrozole for 8 cycles.  (Our original recommendation is TCHP x6 cycles but patient is not willing to go through chemo) 2. left lumpectomy and ALND 09/20/2022: 0.6 cm IDC 2/14 lymph nodes, margins negative, ER 95%, PR 0%, HER2 positive 3. Followed by adjuvant radiation therapy 11/07/22-12/06/22  4.  Adjuvant Kadcyla completed 05/15/2023 5. Adjuvant antiestrogen therapy anastrozole started April 2024 5.  Consideration for neratinib (patient refused) ------------------------------------------------------------------------------------------   Anastrozole Toxicities:  Fatigue: improving with time, managed with energy conservation   Breast cancer surveillance: Breast exam 01/22/2024: Benign Mammograms will need to be done Signatera: Negative  Return to clinic 1 year for follow-up

## 2024-01-23 ENCOUNTER — Telehealth: Payer: Self-pay | Admitting: Hematology and Oncology

## 2024-01-23 NOTE — Telephone Encounter (Signed)
 Scheduled appointment per 3/11 los. Left VM with appointment details.

## 2024-01-29 ENCOUNTER — Ambulatory Visit: Payer: 59 | Admitting: Physical Therapy

## 2024-01-29 DIAGNOSIS — M25612 Stiffness of left shoulder, not elsewhere classified: Secondary | ICD-10-CM

## 2024-01-29 DIAGNOSIS — C50412 Malignant neoplasm of upper-outer quadrant of left female breast: Secondary | ICD-10-CM | POA: Diagnosis not present

## 2024-01-29 DIAGNOSIS — R252 Cramp and spasm: Secondary | ICD-10-CM

## 2024-01-29 NOTE — Therapy (Signed)
 OUTPATIENT PHYSICAL THERAPY  UPPER EXTREMITY ONCOLOGY PROGRESS NOTE  Patient Name: Caitlyn Torres MRN: [id number] DOB:09-25-1961, 63 y.o., female Today's Date: 01/29/2024  END OF SESSION:  PT End of Session - 01/29/24 0939     Visit Number 19    Date for PT Re-Evaluation 02/11/24    Authorization Type [employer]    PT Start Time [phone]    PT Stop Time 1015    PT Time Calculation (min) 38 min    Activity Tolerance Patient tolerated treatment well             Past Medical History:  Diagnosis Date   Anxiety    Arthritis    Cancer (HCC) 03/2022   L breast   Depression    Family history of breast cancer 05/15/2022   Family history of pancreatic cancer 05/15/2022   Family history of prostate cancer 05/15/2022   GERD (gastroesophageal reflux disease)    History of hiatal hernia    patient believes she may have been told she has this years ago   Past Surgical History:  Procedure Laterality Date   BREAST BIOPSY  09/19/2022   MM LT RADIOACTIVE SEED EA ADD LESION LOC MAMMO GUIDE 09/19/2022 [facility]   BREAST BIOPSY  09/19/2022   MM LT RADIOACTIVE SEED LOC MAMMO GUIDE 09/19/2022 [facility]   BREAST LUMPECTOMY WITH RADIOACTIVE SEED AND AXILLARY LYMPH NODE DISSECTION Left 09/20/2022   Procedure: LEFT BREAST SEED LUMPECTOMY, LEFT AXILLARY LYMPH NODE DISSECTION;  Surgeon: Harriette Bouillon, MD;  Location: [facility] OR;  Service: General;  Laterality: Left;  GEN & PEC BLOCK   CERVIX SURGERY     Laser removal of pre-cancer cells   LASIK Bilateral    PORTACATH PLACEMENT Right 04/20/2022   Procedure: PORT PLACEMENT;  Surgeon: Harriette Bouillon, MD;  Location: [facility] OR;  Service: General;  Laterality: Right;   Patient Active Problem List   Diagnosis Date Noted   Other fatigue 08/28/2022   Acute pain 08/28/2022   Genetic testing 06/01/2022   Family history of breast cancer 05/15/2022   Family history of pancreatic cancer 05/15/2022   Family history of prostate cancer 05/15/2022    Port-A-Cath in place 04/28/2022   Malignant neoplasm of upper-outer quadrant of left breast in female, estrogen receptor positive (HCC) 03/29/2022    PCP:   REFERRING PROVIDER: [name], MD   REFERRING DIAG: Left Breast lymphedema/cording  THERAPY DIAG:  Malignant neoplasm of upper-outer quadrant of left female breast, unspecified estrogen receptor status (HCC)  Stiffness of left shoulder, not elsewhere classified  Cramp and spasm  ONSET DATE: March but exacerbated end of November  Rationale for Evaluation and Treatment: Rehabilitation  SUBJECTIVE:  SUBJECTIVE STATEMENT:  The doctor took me off a drug that he thought might be contributing to this.  Symptoms a little worse after driving for work last week, carrying big loads EVAL I was using the Flexitouch 2 times per week and over time I noticed my breast swelling got worse. I have started doing some manual  lymphatic drainage as well and have been using the machine 4-5 times per week. It has gotten better. I have some cording issues too with some tightness. My breast has been sore at the top of my chest, and medial inferior side, and lateral side.  I have still been wearing the pad in my bra. My breast feels heavy and when I take my bra off it is painful.  I got some new compression bras before Thanksgiving, and one of them is the Wear Ease. I sleep in it every night. I got some cami's and sport bra's  PERTINENT HISTORY:  03/14/2022:Screening detected left breast masses 1.5 cm and 1.3 cm with enlarged left axillary lymph node, biopsy revealed grade 3 IDC with lymph node being positive, ER 95%, PR 0%, HER2 2+ by IHC and FISH positive with a ratio 2.45 and a copy #4.9, the second biopsy was HER2 negative   1. Neoadjuvant chemotherapy with Herceptin  Perjeta and letrozole for 8 cycles.  (Our original recommendation is TCHP x6 cycles but patient is not willing to go through chemo) 2. left lumpectomy and ALND 09/20/2022: 0.6 cm IDC 2/14 lymph nodes, margins negative, ER 95%, PR 0%, HER2 positive 3. Followed by adjuvant radiation therapy  4.  Adjuvant antiestrogen therapy with neratinib She had left lumpectomy on 09/20/2022 with 2/14 LN's  PAIN:  Are you having pain? Yes NPRS scale: 4/10 right > left upper traps, 2/10 left breast. Pain location: left breast, lateral, and left pectorals Pain orientation: Left and Upper  PAIN TYPE: aching, tight, and sore, heavy Pain description: constant  Aggravating factors: removing bra, movement, walking, jumping,sleeping on left Relieving factors: compression, MLD  PRECAUTIONS: Left UE lymphedema precautions   RED FLAGS: None   WEIGHT BEARING RESTRICTIONS: No  FALLS:  Has patient fallen in last 6 months? 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  COGNITION: Overall cognitive status: Within functional limits for tasks assessed   PALPATION: Tender medial, lateral and proximal left breast, left UT, lats, pectorals  OBSERVATIONS / OTHER ASSESSMENTS:generalized left breast swelling, mild fibrosis near lateral breast incision with tenderness, very few enlarged pores, cording noted left axillary region running below axillary incision to elbow region  SENSATION: Light touch: Deficits    POSTURE:  forward head, rounded shoulders   UPPER EXTREMITY AROM/PROM:  A/PROM RIGHT   eval   Shoulder extension 57  Shoulder flexion 165  Shoulder abduction 180   Shoulder internal rotation 65  Shoulder external rotation 97    (Blank rows = not tested)  A/PROM LEFT   eval LEFT 11/08/2023 LEFT 12/03/2023 LEFT 12/25/2023  Shoulder extension 50     Shoulder flexion 149 160 160 161  Shoulder abduction 154 168 169 166  Shoulder internal rotation 48  +     Shoulder external rotation 91       (Blank  rows = not tested)  CERVICAL AROM: All within normal limits:   UPPER EXTREMITY STRENGTH:   LYMPHEDEMA ASSESSMENTS:   SURGERY TYPE/DATE:  Left lumpectomy,09/20/2022   NUMBER OF LYMPH NODES REMOVED: 2+/14  CHEMOTHERAPY: No at her request, Did Herceptin, Perjeta and letrozole   RADIATION:Yes, but stopped early at her request  HORMONE TREATMENT: YES  INFECTIONS: NO   LYMPHEDEMA ASSESSMENTS:   LANDMARK RIGHT  eval  At axilla  25.2  15 cm proximal to olecranon process   10 cm proximal to olecranon process 22.7  Olecranon process 21.6  15 cm proximal to ulnar styloid process   10 cm proximal to ulnar styloid process 20.3  Just proximal to ulnar styloid process 14.2  Across hand at thumb web space 19.3  At base of 2nd digit 5.7  (Blank rows = not tested)  LANDMARK LEFT  eval  At axilla  25.2  15 cm proximal to olecranon process   10 cm proximal to olecranon process 22.2  Olecranon process 21.8  15 cm proximal to ulnar styloid process   10 cm proximal to ulnar styloid process 19.1  Just proximal to ulnar styloid process 14.0  Across hand at thumb web space 18.3  At base of 2nd digit 5.9  (Blank rows = not tested)   FUNCTIONAL TESTS:    GAIT: WNL   QUICK DASH SURVEY:   BREAST COMPLAINTS QUESTIONNAIRE(EVAL) Pain:9 Heaviness:8 Swollen feeling:9 Tense Skin:7 Redness:0 Bra Print:10 Size of Pores:8 Hard feeling: 6 Total:   57  /80 A Score over 9 indicates lymphedema issues in the breast    BREAST COMPLAINTS QUESTIONNAIRE (12/03/2023) Pain:4 Heaviness:3 Swollen feeling:3 Tense Skin1 Redness:0 Bra Print:5 Size of  Pores:0 Hard feeling:2  Total:   18  /80 A Score over 9 indicates lymphedema issues in the breast     TODAY'S TREATMENT:  01/29/24: status update and discussion of symptoms Lacrosse ball against wall pinning against right rhomboids trigger point, infraspinatus, teres tender points Manual therapy: soft tissue mobilization to cervical and bil shoulder musculature Trigger Point Dry Needling Subsequent Treatment: Instructions provided previously at initial dry needling treatment.  Patient Verbal Consent Given: Yes Education Handout Provided: Previously Provided Muscles Treated: bil upper traps, bil levator scap, bil cervical multifidi marination, bil rhomboids, bil teres, left infraspinatus, left subscapularis Electrical Stimulation Performed: No Treatment Response/Outcome:  improved soft tissue mobility and decreased tender point size and number  01/14/2024 Performed SOZO screen;well WNL Manual traction, suboccipital release, STM bilateral UT/levator/posterior cervicals with cocoa butter In supine: Short neck, 5 diaphragmatic breaths, R axillary nodes and establishment of interaxillary pathway, L inguinal nodes and establishment of axilloinguinal pathway, then L breast moving fluid towards pathways spending extra time in any areas of fibrosis then retracing all steps in supine and briefly in Right SL to left lateral breast/trunk and left axillo-inguinal pathway, ending with LN"s Discussed overall progress 01/09/24: Review of current HEP and status update Review of levator scap stretch Lacrosse ball against wall pinning against right levator scap trigger point Manual therapy: soft tissue mobilization to cervical and bil shoulder musculature Trigger Point Dry Needling Subsequent Treatment: Instructions provided previously at initial dry needling treatment.  Patient Verbal Consent Given: Yes Education Handout Provided: Previously Provided Muscles Treated: bil upper traps, bil levator scap,  bil cervical multifidi marination,bil suboccipitals,  left pectorals, left teres in supine Electrical Stimulation Performed: No Treatment Response/Outcome:  improved soft tissue mobility and decreased tender point size and number  12/25/2023 Discussed progress toward goals for recert Wall  arc x 4 B, wall slides x 5 for abd Overhead pulleys for abd x 3 min to loosen up MFR to left axillary region and upper arm Manual traction, suboccipital release STM bilateral UT, Left pectorals and lateral trunk with cocoa butter Measured AROM left shoulder   12/20/2023 LTR bilaterally x 3 ea with arms outstretched to stretch pectorals Manual traction, suboccipital release STM bilateral UT, posterior cervicals, levator, left pectorals, and lateral trunk with cocoa butter Supine cervical retractions x 10 into therapist hands for guidance Cupping with small clear cup to left lower rib area and left breast incision area after cocoa butter was applied Supine on half foam roll; Flexion, bilateral scaption, horizontal abduction bilaterally x 5 ea     12/12/23: Review of Chin tucks (pt demo good technique) 10x Discussed Suboccipital release with tennis balls Manual therapy: soft tissue mobilization to cervical and bil shoulder musculature Trigger Point Dry Needling Subsequent Treatment: Instructions provided previously at initial dry needling treatment.  Patient Verbal Consent Given: Yes Education Handout Provided: Previously Provided Muscles Treated: bil upper traps, bil levator scap, bil cervical multifidi marination,bil suboccipitals,  left pectorals, left teres in prone (good twitch) Electrical Stimulation Performed: No Treatment Response/Outcome:  improved soft tissue mobility and decreased tender point size and number 12/11/2023 Prayer stretch straight back and to the sides x 3 ea Cervical retractions x 10 Snow angels x 5 ea B LTR bilaterally x 3 ea with arms outstretched to stretch pectorals Manual  traction, suboccipital release STM bilateral UT, posterior cervicals, levator, left pectorals, and lateral trunk with cocoa butter MFR left upper arm/axilla to area of cording                                                                                                                                      12/05/23: Specific ex's to lengthen upper cervical especially suboccipitals Chin tucks  Chin tuck with neck flexion Suboccipital release with tennis balls Manual therapy: soft tissue mobilization to cervical and bil shoulder musculature Trigger Point Dry Needling Subsequent Treatment: Instructions provided previously at initial dry needling treatment.  Patient Verbal Consent Given: Yes Education Handout Provided: Previously Provided Muscles Treated: bil upper traps, bil levator scap, bil cervical multifidi marination,bil suboccipitals,  left pectorals, left teres Electrical Stimulation Performed: No Treatment Response/Outcome:  improved soft tissue mobility and decreased tender point size and number  12/03/2023 Reviewed goals with pt for recert Wall slides flex and abd x 5 ea STM to bilateral UT,and Left lateral trunk and left scapular area with cocoa butter Supine wand flex and scaption x 5 LTR Bilaterally x 3 Tender to palpation axillary, clavicular, sternal and costal pectorals and lateral breast in region of incision Most tender areas are pectorals as opposed to left breast. Discussed doing MLD for greater than 10 minutes for improved benefit   PATIENT EDUCATION:  Education details: Discussed POC, treatment interventions. Practiced stargazer stretch and LTR  with arms extended to start pectoral stretching and to do at home Person educated: Patient Education method: Explanation Education comprehension: verbalized understanding and returned demonstration  HOME EXERCISE PROGRAM: To start back to stargazer and LTR to stretch pectorals  ASSESSMENT:  CLINICAL IMPRESSION: The  patient benefits significantly from dry needling and manual therapy to stimulate underlying myofascial trigger points and muscular tissue for management of neuromusculoskeletal pain and address movement impairments.  Much improved soft tissue mobility particularly left shoulder and scapular musculature.   Multiple twitch responses in right periscapular musculature which is a good prognostic indicator.  As sessions are now tapered and spread out, PT educated pt on self care strategies to maintain or further improved tissue mobility between sessions.      OBJECTIVE IMPAIRMENTS: decreased activity tolerance, decreased knowledge of condition, decreased ROM, decreased strength, increased edema, impaired UE functional use, postural dysfunction, and pain.   ACTIVITY LIMITATIONS: carrying, sleeping, reach over head, and hygiene/grooming  PARTICIPATION LIMITATIONS:  pt is doing all but with some breast pain/discomfort, limitations in left shoulder ROM  PERSONAL FACTORS:  3+ comorbidities: Left breast cancer s/p infusions and radiation  are also affecting patient's functional outcome.    REHAB POTENTIAL: Excellent  CLINICAL DECISION MAKING: Stable/uncomplicated  EVALUATION COMPLEXITY: Low  GOALS: Goals reviewed with patient? Yes  SHORT TERM GOALS=LONG TERM GOALS: Target date: 12/02/2022    Pt will be independent in self MLD for reducing left breast edema Baseline: Goal status: MET 12/03/2023  2.  Pt will note decreased breast swelling/tenderness by atleast 50% Baseline:  Goal status: MET 12/25/2023  3.  Pt will be able to lie on her left side to sleep for short periods of time Baseline:  Goal status: IN progress   4.  Pt will have left shoulder ROM  flex and abd Within 5-10 degrees of right without increased pain  for improved reaching Baseline:R  flex 165, abd 180 Goal status: MET flexion 11/08/2023, In Progress abd; regressed since last note  5.  Breast complaints survey will be reduced to  no greater than 20 Baseline: 57 Goal status: MET 12/03/2023  6  Pt will have decreased neck/upper quarter pain by 50% overall  Goal Status: In Progress   PLAN:  PT FREQUENCY:1x/week  PT DURATION: 4 weeks  PLANNED INTERVENTIONS: 97164- PT Re-evaluation, 97110-Therapeutic exercises, 97112- Neuromuscular re-education, 97535- Self Care, 78295- Manual therapy, 97760- Orthotic Fit/training, 97016- Vasopneumatic device, Patient/Family education, Manual lymph drainage, Scar mobilization, Therapeutic exercises, Therapeutic activity, Neuromuscular re-education, and Self Care, Dry needling.  PLAN FOR NEXT SESSION:continue DN, Progress strength, ,STM left UQ,  MFR cording left,update HEP with stretches, PROM left, Left breast MLD  Lavinia Sharps, PT 01/29/24 10:21 AM Phone: [phone] Fax: [phone]

## 2024-02-05 ENCOUNTER — Ambulatory Visit: Payer: 59

## 2024-02-05 DIAGNOSIS — M25612 Stiffness of left shoulder, not elsewhere classified: Secondary | ICD-10-CM

## 2024-02-05 DIAGNOSIS — Z483 Aftercare following surgery for neoplasm: Secondary | ICD-10-CM

## 2024-02-05 DIAGNOSIS — C50412 Malignant neoplasm of upper-outer quadrant of left female breast: Secondary | ICD-10-CM

## 2024-02-05 DIAGNOSIS — R252 Cramp and spasm: Secondary | ICD-10-CM

## 2024-02-05 DIAGNOSIS — I890 Lymphedema, not elsewhere classified: Secondary | ICD-10-CM

## 2024-02-05 NOTE — Therapy (Addendum)
 OUTPATIENT PHYSICAL THERAPY  UPPER EXTREMITY ONCOLOGY PROGRESS NOTE  Patient Name: Caitlyn Torres MRN: [id number] DOB:[birth year], 63 y.o., female Today's Date: 02/12/2024  END OF SESSION: Visit Number  20    Number of Visits 28     Date for PT Re-Evaluation 03/11/2024    Authorization Type [employer]     PT Start Time 1003     PT Stop Time 1051     PT Time Calculation (min) 48 min     Activity Tolerance Patient tolerated treatment well     Behavior During Therapy WFL for tasks assessed/performed       Past Medical History:  Diagnosis Date   Anxiety    Arthritis    Cancer (HCC) 03/2022   L breast   Depression    Family history of breast cancer 05/15/2022   Family history of pancreatic cancer 05/15/2022   Family history of prostate cancer 05/15/2022   GERD (gastroesophageal reflux disease)    History of hiatal hernia    patient believes she may have been told she has this years ago   Past Surgical History:  Procedure Laterality Date   BREAST BIOPSY  09/19/2022   MM LT RADIOACTIVE SEED EA ADD LESION LOC MAMMO GUIDE 09/19/2022 [facility]   BREAST BIOPSY  09/19/2022   MM LT RADIOACTIVE SEED LOC MAMMO GUIDE 09/19/2022 [facility]   BREAST LUMPECTOMY WITH RADIOACTIVE SEED AND AXILLARY LYMPH NODE DISSECTION Left 09/20/2022   Procedure: LEFT BREAST SEED LUMPECTOMY, LEFT AXILLARY LYMPH NODE DISSECTION;  Surgeon: Harriette Bouillon, MD;  Location: [facility] OR;  Service: General;  Laterality: Left;  GEN & PEC BLOCK   CERVIX SURGERY     Laser removal of pre-cancer cells   LASIK Bilateral    PORTACATH PLACEMENT Right 04/20/2022   Procedure: PORT PLACEMENT;  Surgeon: Harriette Bouillon, MD;  Location: [facility] OR;  Service: General;  Laterality: Right;   Patient Active Problem List   Diagnosis Date Noted   Other fatigue 08/28/2022   Acute pain 08/28/2022   Genetic testing 06/01/2022   Family history of breast cancer 05/15/2022   Family history of pancreatic cancer 05/15/2022   Family  history of prostate cancer 05/15/2022   Port-A-Cath in place 04/28/2022   Malignant neoplasm of upper-outer quadrant of left breast in female, estrogen receptor positive (HCC) 03/29/2022    PCP:   REFERRING PROVIDER: [name], MD   REFERRING DIAG: Left Breast lymphedema/cording  THERAPY DIAG:  Malignant neoplasm of upper-outer quadrant of left female breast, unspecified estrogen receptor status (HCC)  Stiffness of left shoulder, not elsewhere classified  Cramp and spasm  Aftercare following surgery for neoplasm  Lymphedema of breast  ONSET DATE: March but exacerbated end of November  Rationale for Evaluation and Treatment: Rehabilitation  SUBJECTIVE:  SUBJECTIVE STATEMENT:  02/05/2024 Dr. Pamelia Hoit took me of of Anastrozole for a month; Its been 2 weeks without much change yet.Driving here my neck started hurting and my scapular region. Breast swelling isn't too bad, but its sore at the medial breast with pressure.  EVAL I was using the Flexitouch 2 times per week and over time I noticed my breast swelling got worse. I have started doing some manual  lymphatic drainage as well and have been using the machine 4-5 times per week. It has gotten better. I have some cording issues too with some tightness. My breast has been sore at the top of my chest, and medial inferior side, and lateral side.  I have still been wearing the pad in my bra. My breast feels heavy and when I take my bra off it is painful.  I got some new compression bras before Thanksgiving, and one of them is the Wear Ease. I sleep in it every night. I got some cami's and sport bra's  PERTINENT HISTORY:  03/14/2022:Screening detected left breast masses 1.5 cm and 1.3 cm with enlarged left axillary lymph node, biopsy revealed grade 3 IDC  with lymph node being positive, ER 95%, PR 0%, HER2 2+ by IHC and FISH positive with a ratio 2.45 and a copy #4.9, the second biopsy was HER2 negative   1. Neoadjuvant chemotherapy with Herceptin Perjeta and letrozole for 8 cycles.  (Our original recommendation is TCHP x6 cycles but patient is not willing to go through chemo) 2. left lumpectomy and ALND 09/20/2022: 0.6 cm IDC 2/14 lymph nodes, margins negative, ER 95%, PR 0%, HER2 positive 3. Followed by adjuvant radiation therapy  4.  Adjuvant antiestrogen therapy with neratinib She had left lumpectomy on 09/20/2022 with 2/14 LN's  PAIN:  Are you having pain? Yes NPRS scale: 6/10 right > left upper traps/ upper back, neck, 3/10 left breast(tenderness). Pain location: left breast, lateral, and left pectorals Pain orientation: Left and Upper  PAIN TYPE: aching, tight, and sore, heavy Pain description: constant  Aggravating factors: removing bra, movement, walking, jumping,sleeping on left Relieving factors: compression, MLD  PRECAUTIONS: Left UE lymphedema precautions   RED FLAGS: None   WEIGHT BEARING RESTRICTIONS: No  FALLS:  Has patient fallen in last 6 months? 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  COGNITION: Overall cognitive status: Within functional limits for tasks assessed   PALPATION: Tender medial, lateral and proximal left breast, left UT, lats, pectorals  OBSERVATIONS / OTHER ASSESSMENTS:generalized left breast swelling, mild fibrosis near lateral breast incision with tenderness, very few enlarged pores, cording noted left axillary region running below axillary incision to  elbow region  SENSATION: Light touch: Deficits    POSTURE:  forward head, rounded shoulders   UPPER EXTREMITY AROM/PROM:  A/PROM RIGHT   eval   Shoulder extension 57  Shoulder flexion 165  Shoulder abduction 180  Shoulder internal rotation 65  Shoulder external rotation 97    (Blank rows = not tested)  A/PROM LEFT   eval LEFT 11/08/2023 LEFT 12/03/2023 LEFT 12/25/2023  Shoulder extension 50     Shoulder flexion 149 160 160 161  Shoulder abduction 154 168 169 166  Shoulder internal rotation 48  +  Shoulder external rotation 91       (Blank rows = not tested)  CERVICAL AROM: All within normal limits:   UPPER EXTREMITY STRENGTH:   LYMPHEDEMA ASSESSMENTS:   SURGERY TYPE/DATE:  Left lumpectomy,09/20/2022   NUMBER OF LYMPH NODES REMOVED: 2+/14  CHEMOTHERAPY: No at her request, Did Herceptin, Perjeta and letrozole   RADIATION:Yes, but stopped early at her request  HORMONE TREATMENT: YES  INFECTIONS: NO   LYMPHEDEMA ASSESSMENTS:   LANDMARK RIGHT  eval  At axilla  25.2  15 cm proximal to olecranon process   10 cm proximal to olecranon process 22.7  Olecranon process 21.6  15 cm proximal to ulnar styloid process   10 cm proximal to ulnar styloid process 20.3  Just proximal to ulnar styloid process 14.2  Across hand at thumb web space 19.3  At base of 2nd digit 5.7  (Blank rows = not tested)  LANDMARK LEFT  eval  At axilla  25.2  15 cm proximal to olecranon process   10 cm proximal to olecranon process 22.2  Olecranon process 21.8  15 cm proximal to ulnar styloid process   10 cm proximal to ulnar styloid process 19.1  Just proximal to ulnar styloid process 14.0  Across hand at thumb web space 18.3  At base of 2nd digit 5.9  (Blank rows = not tested)   FUNCTIONAL TESTS:    GAIT: WNL   QUICK DASH SURVEY:   BREAST COMPLAINTS QUESTIONNAIRE(EVAL) Pain:9 Heaviness:8 Swollen feeling:9 Tense Skin:7 Redness:0 Bra Print:10 Size of  Pores:8 Hard feeling: 6 Total:   57  /80 A Score over 9 indicates lymphedema issues in the breast    BREAST COMPLAINTS QUESTIONNAIRE (12/03/2023) Pain:4 Heaviness:3 Swollen feeling:3 Tense Skin1 Redness:0 Bra Print:5 Size of Pores:0 Hard feeling:2  Total:   18  /80 A Score over 9 indicates lymphedema issues in the breast     TODAY'S TREATMENT:   02/05/2024 Manual traction, suboccipital release prolonged holds with good release STM bilateral UT/levator/posterior cervicals  and in SL to scapular region with cocoa butter Lower trunk rotation with hip and knee flexion and feet off of table x 5 ea side Open book x 5 B Supine snow angels x10   01/29/24: status update and discussion of symptoms Lacrosse ball against wall pinning against right rhomboids trigger point, infraspinatus, teres tender points Manual therapy: soft tissue mobilization to cervical and bil shoulder musculature Trigger Point Dry Needling Subsequent Treatment: Instructions provided previously at initial dry needling treatment.  Patient Verbal Consent Given: Yes Education Handout Provided: Previously Provided Muscles Treated: bil upper traps, bil levator scap, bil cervical multifidi marination, bil rhomboids, bil teres, left infraspinatus, left subscapularis Electrical Stimulation Performed: No Treatment Response/Outcome:  improved soft tissue mobility and decreased tender point size and number  01/14/2024 Performed SOZO screen;well WNL Manual traction, suboccipital release, STM bilateral UT/levator/posterior cervicals with cocoa butter In supine: Short neck, 5 diaphragmatic breaths, R axillary nodes and establishment of interaxillary pathway, L inguinal nodes and establishment of axilloinguinal pathway, then L breast moving fluid towards pathways spending extra time in any areas of fibrosis then retracing all steps in supine and briefly in Right SL to left lateral breast/trunk and left axillo-inguinal pathway,  ending with LN"s Discussed overall progress 01/09/24: Review of current HEP and status update Review of levator scap stretch Lacrosse ball against wall pinning against right levator scap trigger point Manual therapy: soft tissue mobilization to cervical and bil shoulder musculature Trigger Point Dry Needling Subsequent Treatment: Instructions provided  previously at initial dry needling treatment.  Patient Verbal Consent Given: Yes Education Handout Provided: Previously Provided Muscles Treated: bil upper traps, bil levator scap, bil cervical multifidi marination,bil suboccipitals,  left pectorals, left teres in supine Electrical Stimulation Performed: No Treatment Response/Outcome:  improved soft tissue mobility and decreased tender point size and number  12/25/2023 Discussed progress toward goals for recert Wall arc x 4 B, wall slides x 5 for abd Overhead pulleys for abd x 3 min to loosen up MFR to left axillary region and upper arm Manual traction, suboccipital release STM bilateral UT, Left pectorals and lateral trunk with cocoa butter Measured AROM left shoulder   12/20/2023 LTR bilaterally x 3 ea with arms outstretched to stretch pectorals Manual traction, suboccipital release STM bilateral UT, posterior cervicals, levator, left pectorals, and lateral trunk with cocoa butter Supine cervical retractions x 10 into therapist hands for guidance Cupping with small clear cup to left lower rib area and left breast incision area after cocoa butter was applied Supine on half foam roll; Flexion, bilateral scaption, horizontal abduction bilaterally x 5 ea     12/12/23: Review of Chin tucks (pt demo good technique) 10x Discussed Suboccipital release with tennis balls Manual therapy: soft tissue mobilization to cervical and bil shoulder musculature Trigger Point Dry Needling Subsequent Treatment: Instructions provided previously at initial dry needling treatment.  Patient Verbal Consent  Given: Yes Education Handout Provided: Previously Provided Muscles Treated: bil upper traps, bil levator scap, bil cervical multifidi marination,bil suboccipitals,  left pectorals, left teres in prone (good twitch) Electrical Stimulation Performed: No Treatment Response/Outcome:  improved soft tissue mobility and decreased tender point size and number 12/11/2023 Prayer stretch straight back and to the sides x 3 ea Cervical retractions x 10 Snow angels x 5 ea B LTR bilaterally x 3 ea with arms outstretched to stretch pectorals Manual traction, suboccipital release STM bilateral UT, posterior cervicals, levator, left pectorals, and lateral trunk with cocoa butter MFR left upper arm/axilla to area of cording                                                                                                                                      12/05/23: Specific ex's to lengthen upper cervical especially suboccipitals Chin tucks  Chin tuck with neck flexion Suboccipital release with tennis balls Manual therapy: soft tissue mobilization to cervical and bil shoulder musculature Trigger Point Dry Needling Subsequent Treatment: Instructions provided previously at initial dry needling treatment.  Patient Verbal Consent Given: Yes Education Handout Provided: Previously Provided Muscles Treated: bil upper traps, bil levator scap, bil cervical multifidi marination,bil suboccipitals,  left pectorals, left teres Electrical Stimulation Performed: No Treatment Response/Outcome:  improved soft tissue mobility and decreased tender point size and number  12/03/2023 Reviewed goals with pt for recert Wall slides flex and abd x 5 ea STM to bilateral UT,and Left lateral trunk and left scapular area with cocoa butter  Supine wand flex and scaption x 5 LTR Bilaterally x 3 Tender to palpation axillary, clavicular, sternal and costal pectorals and lateral breast in region of incision Most tender areas are pectorals  as opposed to left breast. Discussed doing MLD for greater than 10 minutes for improved benefit   PATIENT EDUCATION:  Education details: Discussed POC, treatment interventions. Practiced stargazer stretch and LTR with arms extended to start pectoral stretching and to do at home Person educated: Patient Education method: Explanation Education comprehension: verbalized understanding and returned demonstration  HOME EXERCISE PROGRAM: To start back to stargazer and LTR to stretch pectorals  ASSESSMENT:  CLINICAL IMPRESSION: Pt had been feeling much better with regular PT sessions and dry needling sessions. She has been doing a great deal of long distance driving lately for work activities and has had increased complaints of neck and scapular pain. Due to work activities she has not been seen on a regular basis. She has continued with her stretches and HEP. She will benefit from continued skilled therapy to improve muscle length, strength and pain to  return to her PLOF.    OBJECTIVE IMPAIRMENTS: decreased activity tolerance, decreased knowledge of condition, decreased ROM, decreased strength, increased edema, impaired UE functional use, postural dysfunction, and pain.   ACTIVITY LIMITATIONS: carrying, sleeping, reach over head, and hygiene/grooming  PARTICIPATION LIMITATIONS:  pt is doing all but with some breast pain/discomfort, limitations in left shoulder ROM  PERSONAL FACTORS:  3+ comorbidities: Left breast cancer s/p infusions and radiation  are also affecting patient's functional outcome.    REHAB POTENTIAL: Excellent  CLINICAL DECISION MAKING: Stable/uncomplicated  EVALUATION COMPLEXITY: Low  GOALS: Goals reviewed with patient? Yes  SHORT TERM GOALS=LONG TERM GOALS: Target date: 12/02/2022    Pt will be independent in self MLD for reducing left breast edema Baseline: Goal status: MET 12/03/2023  2.  Pt will note decreased breast swelling/tenderness by atleast 50% Baseline:   Goal status: MET 12/25/2023  3.  Pt will be able to lie on her left side to sleep for short periods of time Baseline:  Goal status: IN progress   4.  Pt will have left shoulder ROM  flex and abd Within 5-10 degrees of right without increased pain  for improved reaching Baseline:R  flex 165, abd 180 Goal status: MET flexion 11/08/2023, In Progress abd; regressed since last note  5.  Breast complaints survey will be reduced to no greater than 20 Baseline: 57 Goal status: MET 12/03/2023  6  Pt will have decreased neck/upper quarter pain by 50% overall  Goal Status: In Progress   PLAN:  PT FREQUENCY:1x/week  PT DURATION: 4 weeks  PLANNED INTERVENTIONS: 97164- PT Re-evaluation, 97110-Therapeutic exercises, 97112- Neuromuscular re-education, 97535- Self Care, 40981- Manual therapy, 97760- Orthotic Fit/training, 97016- Vasopneumatic device, Patient/Family education, Manual lymph drainage, Scar mobilization, Therapeutic exercises, Therapeutic activity, Neuromuscular re-education, and Self Care, Dry needling.  PLAN FOR NEXT SESSION:continue DN, Progress strength, ,STM left UQ,  MFR cording left,update HEP with stretches, PROM left, Left breast MLD  Alvira Monday, PT 02/12/24 12:37 PM Phone: [phone] Fax: [phone]

## 2024-02-08 ENCOUNTER — Other Ambulatory Visit: Payer: BC Managed Care – PPO

## 2024-02-12 NOTE — Addendum Note (Signed)
 Addended by: Waynette Buttery on: 02/12/2024 12:43 PM   Modules accepted: Orders

## 2024-02-13 ENCOUNTER — Ambulatory Visit: Admitting: Physical Therapy

## 2024-02-13 ENCOUNTER — Ambulatory Visit: Payer: Self-pay | Attending: Hematology and Oncology

## 2024-02-13 DIAGNOSIS — C50412 Malignant neoplasm of upper-outer quadrant of left female breast: Secondary | ICD-10-CM | POA: Diagnosis present

## 2024-02-13 DIAGNOSIS — R293 Abnormal posture: Secondary | ICD-10-CM | POA: Insufficient documentation

## 2024-02-13 DIAGNOSIS — M25612 Stiffness of left shoulder, not elsewhere classified: Secondary | ICD-10-CM | POA: Diagnosis present

## 2024-02-13 DIAGNOSIS — R252 Cramp and spasm: Secondary | ICD-10-CM | POA: Diagnosis present

## 2024-02-13 NOTE — Therapy (Signed)
 OUTPATIENT PHYSICAL THERAPY  UPPER EXTREMITY ONCOLOGY PROGRESS NOTE  Patient Name: Caitlyn Torres MRN: [id number] DOB:August 08, 1961, 63 y.o., female Today's Date: 02/13/2024  END OF SESSION:  PT End of Session - 02/13/24 0930     Visit Number 21    Date for PT Re-Evaluation 03/11/24    Authorization Type [employer]    PT Start Time [phone]    PT Stop Time 0931    PT Time Calculation (min) 37 min    Activity Tolerance Patient tolerated treatment well    Behavior During Therapy [facility] for tasks assessed/performed                Past Medical History:  Diagnosis Date   Anxiety    Arthritis    Cancer (HCC) 03/2022   L breast   Depression    Family history of breast cancer 05/15/2022   Family history of pancreatic cancer 05/15/2022   Family history of prostate cancer 05/15/2022   GERD (gastroesophageal reflux disease)    History of hiatal hernia    patient believes she may have been told she has this years ago   Past Surgical History:  Procedure Laterality Date   BREAST BIOPSY  09/19/2022   MM LT RADIOACTIVE SEED EA ADD LESION LOC MAMMO GUIDE 09/19/2022 [facility]   BREAST BIOPSY  09/19/2022   MM LT RADIOACTIVE SEED LOC MAMMO GUIDE 09/19/2022 [facility]   BREAST LUMPECTOMY WITH RADIOACTIVE SEED AND AXILLARY LYMPH NODE DISSECTION Left 09/20/2022   Procedure: LEFT BREAST SEED LUMPECTOMY, LEFT AXILLARY LYMPH NODE DISSECTION;  Surgeon: Harriette Bouillon, MD;  Location: [facility] OR;  Service: General;  Laterality: Left;  GEN & PEC BLOCK   CERVIX SURGERY     Laser removal of pre-cancer cells   LASIK Bilateral    PORTACATH PLACEMENT Right 04/20/2022   Procedure: PORT PLACEMENT;  Surgeon: Harriette Bouillon, MD;  Location: [facility] OR;  Service: General;  Laterality: Right;   Patient Active Problem List   Diagnosis Date Noted   Other fatigue 08/28/2022   Acute pain 08/28/2022   Genetic testing 06/01/2022   Family history of breast cancer 05/15/2022   Family history of pancreatic cancer  05/15/2022   Family history of prostate cancer 05/15/2022   Port-A-Cath in place 04/28/2022   Malignant neoplasm of upper-outer quadrant of left breast in female, estrogen receptor positive (HCC) 03/29/2022    PCP:   REFERRING PROVIDER: [name], MD   REFERRING DIAG: Left Breast lymphedema/cording  THERAPY DIAG:  Malignant neoplasm of upper-outer quadrant of left female breast, unspecified estrogen receptor status (HCC)  Stiffness of left shoulder, not elsewhere classified  Cramp and spasm  Abnormal posture  ONSET DATE: March but exacerbated end of November  Rationale for Evaluation and Treatment: Rehabilitation  SUBJECTIVE:  SUBJECTIVE STATEMENT:  02/13/24 Pain is improving but it is still there.  Rt>Lt neck and thoracic pain. My neck and Rt shoulder feels 25% better and Lt 50% better.    EVAL I was using the Flexitouch 2 times per week and over time I noticed my breast swelling got worse. I have started doing some manual  lymphatic drainage as well and have been using the machine 4-5 times per week. It has gotten better. I have some cording issues too with some tightness. My breast has been sore at the top of my chest, and medial inferior side, and lateral side.  I have still been wearing the pad in my bra. My breast feels heavy and when I take my bra off it is painful.  I got some new compression bras before Thanksgiving, and one of them is the Wear Ease. I sleep in it every night. I got some cami's and sport bra's  PERTINENT HISTORY:  03/14/2022:Screening detected left breast masses 1.5 cm and 1.3 cm with enlarged left axillary lymph node, biopsy revealed grade 3 IDC with lymph node being positive, ER 95%, PR 0%, HER2 2+ by IHC and FISH positive with a ratio 2.45 and a copy #4.9, the second  biopsy was HER2 negative   1. Neoadjuvant chemotherapy with Herceptin Perjeta and letrozole for 8 cycles.  (Our original recommendation is TCHP x6 cycles but patient is not willing to go through chemo) 2. left lumpectomy and ALND 09/20/2022: 0.6 cm IDC 2/14 lymph nodes, margins negative, ER 95%, PR 0%, HER2 positive 3. Followed by adjuvant radiation therapy  4.  Adjuvant antiestrogen therapy with neratinib She had left lumpectomy on 09/20/2022 with 2/14 LN's  PAIN:  Are you having pain? Yes NPRS scale: 6/10 right > left upper traps/ upper back, neck, 3/10 left breast(tenderness). Pain location: left breast, lateral, and left pectorals Pain orientation: Left and Upper  PAIN TYPE: aching, tight, and sore, heavy Pain description: constant  Aggravating factors: removing bra, movement, walking, jumping,sleeping on left Relieving factors: compression, MLD  PRECAUTIONS: Left UE lymphedema precautions   RED FLAGS: None   WEIGHT BEARING RESTRICTIONS: No  FALLS:  Has patient fallen in last 6 months? 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  COGNITION: Overall cognitive status: Within functional limits for tasks assessed   PALPATION: Tender medial, lateral and proximal left breast, left UT, lats, pectorals  OBSERVATIONS / OTHER ASSESSMENTS:generalized left breast swelling, mild fibrosis near lateral breast incision with tenderness, very few enlarged pores, cording noted left axillary region running below axillary incision to elbow region  SENSATION: Light touch: Deficits    POSTURE:  forward head, rounded shoulders   UPPER EXTREMITY  AROM/PROM:  A/PROM RIGHT   eval   Shoulder extension 57  Shoulder flexion 165  Shoulder abduction 180  Shoulder internal rotation 65  Shoulder external rotation 97    (Blank rows = not tested)  A/PROM LEFT   eval LEFT 11/08/2023 LEFT 12/03/2023 LEFT 12/25/2023  Shoulder extension 50     Shoulder flexion 149 160 160 161  Shoulder abduction 154 168 169 166  Shoulder internal rotation 48  +     Shoulder external rotation 91       (  Blank rows = not tested)  CERVICAL AROM: All within normal limits:   UPPER EXTREMITY STRENGTH:   LYMPHEDEMA ASSESSMENTS:   SURGERY TYPE/DATE:  Left lumpectomy,09/20/2022   NUMBER OF LYMPH NODES REMOVED: 2+/14  CHEMOTHERAPY: No at her request, Did Herceptin, Perjeta and letrozole   RADIATION:Yes, but stopped early at her request  HORMONE TREATMENT: YES  INFECTIONS: NO   LYMPHEDEMA ASSESSMENTS:   LANDMARK RIGHT  eval  At axilla  25.2  15 cm proximal to olecranon process   10 cm proximal to olecranon process 22.7  Olecranon process 21.6  15 cm proximal to ulnar styloid process   10 cm proximal to ulnar styloid process 20.3  Just proximal to ulnar styloid process 14.2  Across hand at thumb web space 19.3  At base of 2nd digit 5.7  (Blank rows = not tested)  LANDMARK LEFT  eval  At axilla  25.2  15 cm proximal to olecranon process   10 cm proximal to olecranon process 22.2  Olecranon process 21.8  15 cm proximal to ulnar styloid process   10 cm proximal to ulnar styloid process 19.1  Just proximal to ulnar styloid process 14.0  Across hand at thumb web space 18.3  At base of 2nd digit 5.9  (Blank rows = not tested)   FUNCTIONAL TESTS:    GAIT: WNL   QUICK DASH SURVEY:   BREAST COMPLAINTS QUESTIONNAIRE(EVAL) Pain:9 Heaviness:8 Swollen feeling:9 Tense Skin:7 Redness:0 Bra Print:10 Size of Pores:8 Hard feeling: 6 Total:   57  /80 A Score over 9 indicates lymphedema issues in the breast    BREAST COMPLAINTS  QUESTIONNAIRE (12/03/2023) Pain:4 Heaviness:3 Swollen feeling:3 Tense Skin1 Redness:0 Bra Print:5 Size of Pores:0 Hard feeling:2  Total:   18  /80 A Score over 9 indicates lymphedema issues in the breast     TODAY'S TREATMENT:  02/13/24: Verbal review of all HEP and discussed importance of flexibility exercises to maximize benefit.   Trigger Point Dry Needling Subsequent Treatment: Instructions provided previously at initial dry needling treatment.  Patient Verbal Consent Given: Yes Education Handout Provided: Previously Provided Muscles Treated: bil upper traps, bil levator scap, bil cervical multifidi marination,Rt suboccipitals,  Rt rhomboids,  Electrical Stimulation Performed: No Treatment Response/Outcome:  improved soft tissue mobility and decreased tender point size and number Elongation and release after dry needling to areas treated.   02/05/2024 Manual traction, suboccipital release prolonged holds with good release STM bilateral UT/levator/posterior cervicals  and in SL to scapular region with cocoa butter Lower trunk rotation with hip and knee flexion and feet off of table x 5 ea side Open book x 5 B Supine snow angels x10   01/29/24: status update and discussion of symptoms Lacrosse ball against wall pinning against right rhomboids trigger point, infraspinatus, teres tender points Manual therapy: soft tissue mobilization to cervical and bil shoulder musculature Trigger Point Dry Needling Subsequent Treatment: Instructions provided previously at initial dry needling treatment.  Patient Verbal Consent Given: Yes Education Handout Provided: Previously Provided Muscles Treated: bil upper traps, bil levator scap, bil cervical multifidi marination, bil rhomboids, bil teres, left infraspinatus, left subscapularis Electrical Stimulation Performed: No Treatment Response/Outcome:  improved soft tissue mobility and decreased tender point size and  number  01/14/2024 Performed SOZO screen;well WNL Manual traction, suboccipital release, STM bilateral UT/levator/posterior cervicals with cocoa butter In supine: Short neck, 5 diaphragmatic breaths, R axillary nodes and establishment of interaxillary pathway, L inguinal nodes and establishment of axilloinguinal pathway, then L breast moving fluid towards  pathways spending extra time in any areas of fibrosis then retracing all steps in supine and briefly in Right SL to left lateral breast/trunk and left axillo-inguinal pathway, ending with LN"s Discussed overall progress 01/09/24: Review of current HEP and status update Review of levator scap stretch Lacrosse ball against wall pinning against right levator scap trigger point Manual therapy: soft tissue mobilization to cervical and bil shoulder musculature Trigger Point Dry Needling Subsequent Treatment: Instructions provided previously at initial dry needling treatment.  Patient Verbal Consent Given: Yes Education Handout Provided: Previously Provided Muscles Treated: bil upper traps, bil levator scap, bil cervical multifidi marination,bil suboccipitals,  left pectorals, left teres in supine Electrical Stimulation Performed: No Treatment Response/Outcome:  improved soft tissue mobility and decreased tender point size and number  PATIENT EDUCATION:  Education details: Discussed POC, treatment interventions. Practiced stargazer stretch and LTR with arms extended to start pectoral stretching and to do at home Person educated: Patient Education method: Explanation Education comprehension: verbalized understanding and returned demonstration  HOME EXERCISE PROGRAM: To start back to stargazer and LTR to stretch pectorals  ASSESSMENT:  CLINICAL IMPRESSION: Pt had been feeling much better with regular PT sessions and dry needling sessions and reports 25-50% overall reduction in neck and shoulder pain.  Pt reports compliance with flexibility  exercises.  She has not been as consistent with use of ball against wall for tissue mobilization.  Good response to dry needling with multiple twitch response and improved tissue mobility at the end of session. She will benefit from continued skilled therapy to improve muscle length, strength and pain to  return to her PLOF.    OBJECTIVE IMPAIRMENTS: decreased activity tolerance, decreased knowledge of condition, decreased ROM, decreased strength, increased edema, impaired UE functional use, postural dysfunction, and pain.   ACTIVITY LIMITATIONS: carrying, sleeping, reach over head, and hygiene/grooming  PARTICIPATION LIMITATIONS:  pt is doing all but with some breast pain/discomfort, limitations in left shoulder ROM  PERSONAL FACTORS:  3+ comorbidities: Left breast cancer s/p infusions and radiation  are also affecting patient's functional outcome.    REHAB POTENTIAL: Excellent  CLINICAL DECISION MAKING: Stable/uncomplicated  EVALUATION COMPLEXITY: Low  GOALS: Goals reviewed with patient? Yes  SHORT TERM GOALS=LONG TERM GOALS: Target date: 03/11/24    Pt will be independent in self MLD for reducing left breast edema Baseline: Goal status: MET 12/03/2023  2.  Pt will note decreased breast swelling/tenderness by atleast 50% Baseline:  Goal status: MET 12/25/2023  3.  Pt will be able to lie on her left side to sleep for short periods of time Baseline:  Goal status: IN progress   4.  Pt will have left shoulder ROM  flex and abd Within 5-10 degrees of right without increased pain  for improved reaching Baseline:R  flex 165, abd 180 Goal status: MET flexion 11/08/2023, In Progress abd; regressed since last note  5.  Breast complaints survey will be reduced to no greater than 20 Baseline: 57 Goal status: MET 12/03/2023  6  Pt will have decreased neck/upper quarter pain by 50% overall  Baseline: 25% on Rt, 50% on Lt (02/13/24)  Goal Status: In Progress   PLAN:  PT  FREQUENCY:1x/week  PT DURATION: 4 weeks  PLANNED INTERVENTIONS: 97164- PT Re-evaluation, 97110-Therapeutic exercises, 97112- Neuromuscular re-education, 97535- Self Care, 16109- Manual therapy, 97760- Orthotic Fit/training, 97016- Vasopneumatic device, Patient/Family education, Manual lymph drainage, Scar mobilization, Therapeutic exercises, Therapeutic activity, Neuromuscular re-education, and Self Care, Dry needling.  PLAN FOR NEXT SESSION:continue DN, Progress strength, ,  STM left UQ,  MFR cording left,update HEP with stretches, PROM left, Left breast MLD  Lorrene Reid, PT 02/13/24 9:40 AM

## 2024-02-15 ENCOUNTER — Encounter: Payer: BC Managed Care – PPO | Admitting: Nurse Practitioner

## 2024-02-15 ENCOUNTER — Encounter: Payer: BC Managed Care – PPO | Admitting: Family Medicine

## 2024-02-19 ENCOUNTER — Inpatient Hospital Stay: Attending: Hematology and Oncology | Admitting: Hematology and Oncology

## 2024-02-19 DIAGNOSIS — C50412 Malignant neoplasm of upper-outer quadrant of left female breast: Secondary | ICD-10-CM

## 2024-02-19 DIAGNOSIS — Z170 Estrogen receptor positive status [ER+]: Secondary | ICD-10-CM | POA: Diagnosis not present

## 2024-02-19 MED ORDER — LETROZOLE 2.5 MG PO TABS: 2.5000 mg | ORAL_TABLET | Freq: Every day | ORAL | 3 refills | Status: AC

## 2024-02-19 NOTE — Assessment & Plan Note (Signed)
 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   Treatment plan based on multidisciplinary tumor board: 1. Neoadjuvant chemotherapy with Herceptin Perjeta and letrozole for 8 cycles.  (Our original recommendation is TCHP x6 cycles but patient is not willing to go through chemo) 2. left lumpectomy and ALND 09/20/2022: 0.6 cm IDC 2/14 lymph nodes, margins negative, ER 95%, PR 0%, HER2 positive 3. Followed by adjuvant radiation therapy 11/07/22-12/06/22  4.  Adjuvant Kadcyla completed 05/15/2023 5. Adjuvant antiestrogen therapy anastrozole started April 2024-01/22/2024 (musculoskeletal pains) 5.  Consideration for neratinib (patient refused) ------------------------------------------------------------------------------------------   Anastrozole Toxicities:  Fatigue:  Feelings of depression Generalized body aches and pains especially in the neck: She has used acupuncture, massage therapy, dry needling but her symptoms are continuing to bother her.   I recommended discontinuation of anastrozole therapy and following up with her in 1 month to see if her symptoms improved.  If they did improve then we can talk about switching her to letrozole.   Breast cancer surveillance: Breast exam 01/22/2024: Benign Mammograms will need to be done Signatera: Negative   Telephone visit in 1 month to discuss her symptoms after stopping anastrozole.

## 2024-02-19 NOTE — Progress Notes (Signed)
 HEMATOLOGY-ONCOLOGY TELEPHONE VISIT PROGRESS NOTE  I connected with our patient on 02/19/24 at  8:45 AM EDT by telephone and verified that I am speaking with the correct person using two identifiers.  I discussed the limitations, risks, security and privacy concerns of performing an evaluation and management service by telephone and the availability of in person appointments.  I also discussed with the patient that there may be a patient responsible charge related to this service. The patient expressed understanding and agreed to proceed.   History of Present Illness: Follow-up after stopping anastrozole therapy  History of Present Illness The patient, with a history of anastrozole use, presents with muscle tightness and pain, particularly in the neck and rhomboid area. She reports an improvement in some muscle groups by twenty five to fifty percent since stopping anastrozole. However, she still experiences episodes of muscle tightness, particularly on stressful days. The right side of her body, particularly the rhomboid area and neck, is more affected than the left. The patient has been undergoing physical therapy and dry needling for symptom management.    Oncology History  Malignant neoplasm of upper-outer quadrant of left breast in female, estrogen receptor positive (HCC)  03/14/2022 Initial Diagnosis   Screening detected left breast masses 1.5 cm and 1.3 cm with enlarged left axillary lymph node, biopsy revealed grade 3 IDC with lymph node being positive, ER 95%, PR 0%, HER2 2+ by IHC and FISH positive with a ratio 2.45 and a copy #4.9, the second biopsy was HER2 negative with a ratio 2.15 and a copy #3.55   03/29/2022 Cancer Staging   Staging form: Breast, AJCC 8th Edition - Clinical: Stage IIA (cT1c, cN1, cM0, G3, ER+, PR-, HER2+) - Signed by [name], MD on 03/29/2022 Stage prefix: Initial diagnosis Histologic grading system: 3 grade system   03/29/2022 - 09/2022 Anti-estrogen oral  therapy   Letrozole daily   04/21/2022 -  Chemotherapy   Herceptin/Perjeta every 21 days initially x 3, will reassess with MRI and then will decide whether to continue or to add chemo.  (Original recommendation was TCHP x 6)      Genetic Testing   [facility] CancerNext-Expanded Panel was Negative. Report date is 05/30/2022.  The CancerNext-Expanded gene panel offered by [facility] and includes sequencing, rearrangement, and RNA analysis for the following 77 genes: AIP, ALK, APC, ATM, AXIN2, BAP1, BARD1, BLM, BMPR1A, BRCA1, BRCA2, BRIP1, CDC73, CDH1, CDK4, CDKN1B, CDKN2A, CHEK2, CTNNA1, DICER1, FANCC, FH, FLCN, GALNT12, KIF1B, LZTR1, MAX, MEN1, MET, MLH1, MSH2, MSH3, MSH6, MUTYH, NBN, NF1, NF2, NTHL1, PALB2, PHOX2B, PMS2, POT1, PRKAR1A, PTCH1, PTEN, RAD51C, RAD51D, RB1, RECQL, RET, SDHA, SDHAF2, SDHB, SDHC, SDHD, SMAD4, SMARCA4, SMARCB1, SMARCE1, STK11, SUFU, TMEM127, TP53, TSC1, TSC2, VHL and XRCC2 (sequencing and deletion/duplication); EGFR, EGLN1, HOXB13, KIT, MITF, PDGFRA, POLD1, and POLE (sequencing only); EPCAM and GREM1 (deletion/duplication only).    04/21/2022 - 09/14/2022 Chemotherapy   Given neoadjuvantly with Letrozole (declined neoadjuvant chemotherapy with TCHP) Patient is on Treatment Plan : BREAST Trastuzumab  + Pertuzumab q21d x 13 cycles      09/20/2022 Surgery   left lumpectomy and ALND: 0.6 cm IDC 2/14 lymph nodes, margins negative, ER 95%, PR 0%, HER2 positive   10/13/2022 -  Chemotherapy   Patient is on Treatment Plan : BREAST ADO-Trastuzumab Emtansine (Kadcyla) q21d     11/07/2022 - 12/06/2022 Radiation Therapy   Site/dose:   The patient had a prescriptive dose of 50.4 Gy in 28 fractions to the breast and SCLV region using a 4-field  approach. This was to be delivered using a 3-D conformal technique. The patient did not receive the planned boost over 10 Gy in 5 fractions.  In total, she completed 36 Gy in 20 of the 33 planned fractions.   02/2023 -  Anti-estrogen oral therapy    Anastrozole daily     REVIEW OF SYSTEMS:   Constitutional: Denies fevers, chills or abnormal weight loss All other systems were reviewed with the patient and are negative. 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   Treatment plan based on multidisciplinary tumor board: 1. Neoadjuvant chemotherapy with Herceptin Perjeta and letrozole for 8 cycles.  (Our original recommendation is TCHP x6 cycles but patient is not willing to go through chemo) 2. left lumpectomy and ALND 09/20/2022: 0.6 cm IDC 2/14 lymph nodes, margins negative, ER 95%, PR 0%, HER2 positive 3. Followed by adjuvant radiation therapy 11/07/22-12/06/22  4.  Adjuvant Kadcyla completed 05/15/2023 5. Adjuvant antiestrogen therapy anastrozole started April 2024-01/22/2024 (musculoskeletal pains) 5.  Consideration for neratinib (patient refused) ------------------------------------------------------------------------------------------   Anastrozole Toxicities:  Fatigue:  Feelings of depression Generalized body aches and pains: Improved after stopping Anastrozole.   recommend switching her to letrozole.   Breast cancer surveillance: Breast exam 01/22/2024: Benign Mammograms will need to be done Signatera: Negative  She is a 96-month-old grand baby Telephone visit in 1 month to discuss her symptoms after starting Letrozole    I discussed the assessment and treatment plan with the patient. The patient was provided an opportunity to ask questions and all were answered. The patient agreed with the plan and  demonstrated an understanding of the instructions. The patient was advised to call back or seek an in-person evaluation if the symptoms worsen or if the condition fails to improve as anticipated.   I provided 20 minutes of non-face-to-face time during this encounter.  This includes time for charting and coordination of care   Tamsen Meek, MD

## 2024-02-20 ENCOUNTER — Ambulatory Visit: Attending: Hematology and Oncology

## 2024-02-20 DIAGNOSIS — R293 Abnormal posture: Secondary | ICD-10-CM | POA: Insufficient documentation

## 2024-02-20 DIAGNOSIS — I890 Lymphedema, not elsewhere classified: Secondary | ICD-10-CM | POA: Insufficient documentation

## 2024-02-20 DIAGNOSIS — C50412 Malignant neoplasm of upper-outer quadrant of left female breast: Secondary | ICD-10-CM | POA: Diagnosis present

## 2024-02-20 DIAGNOSIS — R252 Cramp and spasm: Secondary | ICD-10-CM | POA: Diagnosis present

## 2024-02-20 DIAGNOSIS — M25612 Stiffness of left shoulder, not elsewhere classified: Secondary | ICD-10-CM | POA: Insufficient documentation

## 2024-02-20 DIAGNOSIS — M6281 Muscle weakness (generalized): Secondary | ICD-10-CM | POA: Diagnosis present

## 2024-02-20 DIAGNOSIS — Z483 Aftercare following surgery for neoplasm: Secondary | ICD-10-CM | POA: Diagnosis present

## 2024-02-20 NOTE — Therapy (Signed)
 OUTPATIENT PHYSICAL THERAPY  UPPER EXTREMITY ONCOLOGY PROGRESS NOTE  Patient Name: Caitlyn Torres MRN: [id number] DOB:06-Mar-1961, 63 y.o., female Today's Date: 02/20/2024  END OF SESSION:  PT End of Session - 02/20/24 1200     Visit Number 22    Number of Visits 28    Date for PT Re-Evaluation 03/11/24    Authorization Type [employer]    PT Start Time 1109    PT Stop Time 1159    PT Time Calculation (min) 50 min    Activity Tolerance Patient tolerated treatment well    Behavior During Therapy WFL for tasks assessed/performed                Past Medical History:  Diagnosis Date   Anxiety    Arthritis    Cancer (HCC) 03/2022   L breast   Depression    Family history of breast cancer 05/15/2022   Family history of pancreatic cancer 05/15/2022   Family history of prostate cancer 05/15/2022   GERD (gastroesophageal reflux disease)    History of hiatal hernia    patient believes she may have been told she has this years ago   Past Surgical History:  Procedure Laterality Date   BREAST BIOPSY  09/19/2022   MM LT RADIOACTIVE SEED EA ADD LESION LOC MAMMO GUIDE 09/19/2022 [facility]   BREAST BIOPSY  09/19/2022   MM LT RADIOACTIVE SEED LOC MAMMO GUIDE 09/19/2022 [facility]   BREAST LUMPECTOMY WITH RADIOACTIVE SEED AND AXILLARY LYMPH NODE DISSECTION Left 09/20/2022   Procedure: LEFT BREAST SEED LUMPECTOMY, LEFT AXILLARY LYMPH NODE DISSECTION;  Surgeon: Harriette Bouillon, MD;  Location: [facility] OR;  Service: General;  Laterality: Left;  GEN & PEC BLOCK   CERVIX SURGERY     Laser removal of pre-cancer cells   LASIK Bilateral    PORTACATH PLACEMENT Right 04/20/2022   Procedure: PORT PLACEMENT;  Surgeon: Harriette Bouillon, MD;  Location: [facility] OR;  Service: General;  Laterality: Right;   Patient Active Problem List   Diagnosis Date Noted   Other fatigue 08/28/2022   Acute pain 08/28/2022   Genetic testing 06/01/2022   Family history of breast cancer 05/15/2022   Family  history of pancreatic cancer 05/15/2022   Family history of prostate cancer 05/15/2022   Port-A-Cath in place 04/28/2022   Malignant neoplasm of upper-outer quadrant of left breast in female, estrogen receptor positive (HCC) 03/29/2022    PCP:   REFERRING PROVIDER: [name], MD   REFERRING DIAG: Left Breast lymphedema/cording  THERAPY DIAG:  Malignant neoplasm of upper-outer quadrant of left female breast, unspecified estrogen receptor status (HCC)  Stiffness of left shoulder, not elsewhere classified  Cramp and spasm  Abnormal posture  Aftercare following surgery for neoplasm  Lymphedema of breast  ONSET DATE: March but exacerbated end of November  Rationale for Evaluation and Treatment: Rehabilitation  SUBJECTIVE:  SUBJECTIVE STATEMENT:    I had meetings all day yesterday. There was a time over the weekend when I had no pain and it was wonderful. I was really sore after DN last time and I was bruised, but I did really well over the weekend. Dr. Pamelia Hoit changed me to Letrozole but I haven't started it yet. I have been driving and holding the baby for an hour and a half so I am sore now.  EVAL I was using the Flexitouch 2 times per week and over time I noticed my breast swelling got worse. I have started doing some manual  lymphatic drainage as well and have been using the machine 4-5 times per week. It has gotten better. I have some cording issues too with some tightness. My breast has been sore at the top of my chest, and medial inferior side, and lateral side.  I have still been wearing the pad in my bra. My breast feels heavy and when I take my bra off it is painful.  I got some new compression bras before Thanksgiving, and one of them is the Wear Ease. I sleep in it every night. I got some  cami's and sport bra's  PERTINENT HISTORY:  03/14/2022:Screening detected left breast masses 1.5 cm and 1.3 cm with enlarged left axillary lymph node, biopsy revealed grade 3 IDC with lymph node being positive, ER 95%, PR 0%, HER2 2+ by IHC and FISH positive with a ratio 2.45 and a copy #4.9, the second biopsy was HER2 negative   1. Neoadjuvant chemotherapy with Herceptin Perjeta and letrozole for 8 cycles.  (Our original recommendation is TCHP x6 cycles but patient is not willing to go through chemo) 2. left lumpectomy and ALND 09/20/2022: 0.6 cm IDC 2/14 lymph nodes, margins negative, ER 95%, PR 0%, HER2 positive 3. Followed by adjuvant radiation therapy  4.  Adjuvant antiestrogen therapy with neratinib She had left lumpectomy on 09/20/2022 with 2/14 LN's  PAIN:  Are you having pain? Yes NPRS scale: 6/10 right bilateral rhomboids,traps 3/10 left breast(tenderness). Pain location: left breast, lateral, and left pectorals Pain orientation: Left and Upper  PAIN TYPE: aching, tight, and sore, heavy Pain description: constant  Aggravating factors: removing bra, movement, walking, jumping,sleeping on left Relieving factors: compression, MLD  PRECAUTIONS: Left UE lymphedema precautions   RED FLAGS: None   WEIGHT BEARING RESTRICTIONS: No  FALLS:  Has patient fallen in last 6 months? 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  COGNITION: Overall cognitive status: Within functional limits for tasks assessed   PALPATION: Tender medial, lateral and proximal left breast, left UT, lats, pectorals  OBSERVATIONS / OTHER ASSESSMENTS:generalized left  breast swelling, mild fibrosis near lateral breast incision with tenderness, very few enlarged pores, cording noted left axillary region running below axillary incision to elbow region  SENSATION: Light touch: Deficits    POSTURE:  forward head, rounded shoulders   UPPER EXTREMITY AROM/PROM:  A/PROM RIGHT   eval   Shoulder extension 57  Shoulder flexion 165  Shoulder abduction 180  Shoulder internal rotation 65  Shoulder external rotation 97    (Blank rows = not tested)  A/PROM LEFT   eval LEFT 11/08/2023 LEFT 12/03/2023  LEFT 12/25/2023  Shoulder extension 50     Shoulder flexion 149 160 160 161  Shoulder abduction 154 168 169 166  Shoulder internal rotation 48  +     Shoulder external rotation 91       (Blank rows = not tested)  CERVICAL AROM: All within normal limits:   UPPER EXTREMITY STRENGTH:   LYMPHEDEMA ASSESSMENTS:   SURGERY TYPE/DATE:  Left lumpectomy,09/20/2022   NUMBER OF LYMPH NODES REMOVED: 2+/14  CHEMOTHERAPY: No at her request, Did Herceptin, Perjeta and letrozole   RADIATION:Yes, but stopped early at her request  HORMONE TREATMENT: YES  INFECTIONS: NO   LYMPHEDEMA ASSESSMENTS:   LANDMARK RIGHT  eval  At axilla  25.2  15 cm proximal to olecranon process   10 cm proximal to olecranon process 22.7  Olecranon process 21.6  15 cm proximal to ulnar styloid process   10 cm proximal to ulnar styloid process 20.3  Just proximal to ulnar styloid process 14.2  Across hand at thumb web space 19.3  At base of 2nd digit 5.7  (Blank rows = not tested)  LANDMARK LEFT  eval  At axilla  25.2  15 cm proximal to olecranon process   10 cm proximal to olecranon process 22.2  Olecranon process 21.8  15 cm proximal to ulnar styloid process   10 cm proximal to ulnar styloid process 19.1  Just proximal to ulnar styloid process 14.0  Across hand at thumb web space 18.3  At base of 2nd digit 5.9  (Blank rows = not tested)   FUNCTIONAL TESTS:     GAIT: WNL   QUICK DASH SURVEY:   BREAST COMPLAINTS QUESTIONNAIRE(EVAL) Pain:9 Heaviness:8 Swollen feeling:9 Tense Skin:7 Redness:0 Bra Print:10 Size of Pores:8 Hard feeling: 6 Total:   57  /80 A Score over 9 indicates lymphedema issues in the breast    BREAST COMPLAINTS QUESTIONNAIRE (12/03/2023) Pain:4 Heaviness:3 Swollen feeling:3 Tense Skin1 Redness:0 Bra Print:5 Size of Pores:0 Hard feeling:2  Total:   18  /80 A Score over 9 indicates lymphedema issues in the breast     TODAY'S TREATMENT:   02/20/2024 Manual traction, suboccipital release prolonged holds  STM bilateral UT/levator/posterior cervicals  and in SL to scapular regions with cocoa butter. Worked under right scapular region with arm in IR for  TP release   02/13/24: Verbal review of all HEP and discussed importance of flexibility exercises to maximize benefit.   Trigger Point Dry Needling Subsequent Treatment: Instructions provided previously at initial dry needling treatment.  Patient Verbal Consent Given: Yes Education Handout Provided: Previously Provided Muscles Treated: bil upper traps, bil levator scap, bil cervical multifidi marination,Rt suboccipitals,  Rt rhomboids,  Electrical Stimulation Performed: No Treatment Response/Outcome:  improved soft tissue mobility and decreased tender point size and number Elongation and release after dry needling to areas treated.   02/05/2024 Manual traction, suboccipital release prolonged holds with good release STM bilateral UT/levator/posterior cervicals  and in SL to scapular region with cocoa butter Lower trunk rotation with hip and knee flexion and feet off of table x 5 ea side Open book x 5 B Supine snow angels x10   01/29/24: status update and discussion of symptoms Lacrosse ball against wall pinning against right rhomboids trigger point, infraspinatus, teres tender points Manual therapy: soft tissue mobilization to cervical and bil shoulder  musculature Trigger Point Dry Needling Subsequent Treatment: Instructions provided previously at initial dry needling treatment.  Patient Verbal Consent Given: Yes Education Handout Provided: Previously Provided Muscles Treated:  bil upper traps, bil levator scap, bil cervical multifidi marination, bil rhomboids, bil teres, left infraspinatus, left subscapularis Electrical Stimulation Performed: No Treatment Response/Outcome:  improved soft tissue mobility and decreased tender point size and number  01/14/2024 Performed SOZO screen;well WNL Manual traction, suboccipital release, STM bilateral UT/levator/posterior cervicals with cocoa butter In supine: Short neck, 5 diaphragmatic breaths, R axillary nodes and establishment of interaxillary pathway, L inguinal nodes and establishment of axilloinguinal pathway, then L breast moving fluid towards pathways spending extra time in any areas of fibrosis then retracing all steps in supine and briefly in Right SL to left lateral breast/trunk and left axillo-inguinal pathway, ending with LN"s Discussed overall progress 01/09/24: Review of current HEP and status update Review of levator scap stretch Lacrosse ball against wall pinning against right levator scap trigger point Manual therapy: soft tissue mobilization to cervical and bil shoulder musculature Trigger Point Dry Needling Subsequent Treatment: Instructions provided previously at initial dry needling treatment.  Patient Verbal Consent Given: Yes Education Handout Provided: Previously Provided Muscles Treated: bil upper traps, bil levator scap, bil cervical multifidi marination,bil suboccipitals,  left pectorals, left teres in supine Electrical Stimulation Performed: No Treatment Response/Outcome:  improved soft tissue mobility and decreased tender point size and number  PATIENT EDUCATION:  Education details: Discussed POC, treatment interventions. Practiced stargazer stretch and LTR with arms  extended to start pectoral stretching and to do at home Person educated: Patient Education method: Explanation Education comprehension: verbalized understanding and returned demonstration  HOME EXERCISE PROGRAM: To start back to stargazer and LTR to stretch pectorals  ASSESSMENT:  CLINICAL IMPRESSION:  Doing better overall but after holding baby for an hour and a half had increased rhomboid pain right greater than left. Released TP that felt like a catch before MT. Pt reminded to try tennis ball on wall.    OBJECTIVE IMPAIRMENTS: decreased activity tolerance, decreased knowledge of condition, decreased ROM, decreased strength, increased edema, impaired UE functional use, postural dysfunction, and pain.   ACTIVITY LIMITATIONS: carrying, sleeping, reach over head, and hygiene/grooming  PARTICIPATION LIMITATIONS:  pt is doing all but with some breast pain/discomfort, limitations in left shoulder ROM  PERSONAL FACTORS:  3+ comorbidities: Left breast cancer s/p infusions and radiation  are also affecting patient's functional outcome.    REHAB POTENTIAL: Excellent  CLINICAL DECISION MAKING: Stable/uncomplicated  EVALUATION COMPLEXITY: Low  GOALS: Goals reviewed with patient? Yes  SHORT TERM GOALS=LONG TERM GOALS: Target date: 03/11/24    Pt will be independent in self MLD for reducing left breast edema Baseline: Goal status: MET 12/03/2023  2.  Pt will note decreased breast swelling/tenderness by atleast 50% Baseline:  Goal status: MET 12/25/2023  3.  Pt will be able to lie on her left side to sleep for short periods of time Baseline:  Goal status: IN progress   4.  Pt will have left shoulder ROM  flex and abd Within 5-10 degrees of right without increased pain  for improved reaching Baseline:R  flex 165, abd 180 Goal status: MET flexion 11/08/2023, In Progress abd; regressed since last note  5.  Breast complaints survey will be reduced to no greater than 20 Baseline:  57 Goal status: MET 12/03/2023  6  Pt will have decreased neck/upper quarter pain by 50% overall  Baseline: 25% on Rt, 50% on Lt (02/13/24)  Goal Status: In Progress   PLAN:  PT FREQUENCY:1x/week  PT DURATION: 4 weeks  PLANNED INTERVENTIONS: 97164- PT Re-evaluation, 97110-Therapeutic exercises, [id number]- Neuromuscular re-education, 97535- Self Care,  81191- Manual therapy, 97760- Orthotic Fit/training, 47829- Vasopneumatic device, Patient/Family education, Manual lymph drainage, Scar mobilization, Therapeutic exercises, Therapeutic activity, Neuromuscular re-education, and Self Care, Dry needling.  PLAN FOR NEXT SESSION:continue DN, Progress strength, ,STM left UQ,  MFR cording left,update HEP with stretches, PROM left, Left breast MLD  Lorrene Reid, PT 02/20/24 12:03 PM

## 2024-02-28 ENCOUNTER — Ambulatory Visit: Admitting: Physical Therapy

## 2024-02-28 DIAGNOSIS — C50412 Malignant neoplasm of upper-outer quadrant of left female breast: Secondary | ICD-10-CM | POA: Diagnosis not present

## 2024-02-28 DIAGNOSIS — R252 Cramp and spasm: Secondary | ICD-10-CM

## 2024-02-28 DIAGNOSIS — M25612 Stiffness of left shoulder, not elsewhere classified: Secondary | ICD-10-CM

## 2024-02-28 NOTE — Therapy (Signed)
 OUTPATIENT PHYSICAL THERAPY  UPPER EXTREMITY ONCOLOGY PROGRESS NOTE  Patient Name: Caitlyn Torres MRN: [id number] DOB:Jul 24, 1961, 63 y.o., female Today's Date: 02/28/2024  END OF SESSION:  PT End of Session - 02/28/24 0807     Visit Number 23    Number of Visits 28    Date for PT Re-Evaluation 03/11/24    Authorization Type [employer]    PT Start Time 0806    PT Stop Time 0844    PT Time Calculation (min) 38 min    Activity Tolerance Patient tolerated treatment well                Past Medical History:  Diagnosis Date   Anxiety    Arthritis    Cancer (HCC) 03/2022   L breast   Depression    Family history of breast cancer 05/15/2022   Family history of pancreatic cancer 05/15/2022   Family history of prostate cancer 05/15/2022   GERD (gastroesophageal reflux disease)    History of hiatal hernia    patient believes she may have been told she has this years ago   Past Surgical History:  Procedure Laterality Date   BREAST BIOPSY  09/19/2022   MM LT RADIOACTIVE SEED EA ADD LESION LOC MAMMO GUIDE 09/19/2022 [facility]   BREAST BIOPSY  09/19/2022   MM LT RADIOACTIVE SEED LOC MAMMO GUIDE 09/19/2022 [facility]   BREAST LUMPECTOMY WITH RADIOACTIVE SEED AND AXILLARY LYMPH NODE DISSECTION Left 09/20/2022   Procedure: LEFT BREAST SEED LUMPECTOMY, LEFT AXILLARY LYMPH NODE DISSECTION;  Surgeon: Sim Dryer, MD;  Location: [facility] OR;  Service: General;  Laterality: Left;  GEN & PEC BLOCK   CERVIX SURGERY     Laser removal of pre-cancer cells   LASIK Bilateral    PORTACATH PLACEMENT Right 04/20/2022   Procedure: PORT PLACEMENT;  Surgeon: Sim Dryer, MD;  Location: [facility] OR;  Service: General;  Laterality: Right;   Patient Active Problem List   Diagnosis Date Noted   Other fatigue 08/28/2022   Acute pain 08/28/2022   Genetic testing 06/01/2022   Family history of breast cancer 05/15/2022   Family history of pancreatic cancer 05/15/2022   Family history of  prostate cancer 05/15/2022   Port-A-Cath in place 04/28/2022   Malignant neoplasm of upper-outer quadrant of left breast in female, estrogen receptor positive (HCC) 03/29/2022    PCP:   REFERRING PROVIDER: Cameron Cea, MD   REFERRING DIAG: Left Breast lymphedema/cording  THERAPY DIAG:  Stiffness of left shoulder, not elsewhere classified  Malignant neoplasm of upper-outer quadrant of left female breast, unspecified estrogen receptor status (HCC)  Cramp and spasm  ONSET DATE: March but exacerbated end of November  Rationale for Evaluation and Treatment: Rehabilitation  SUBJECTIVE:  SUBJECTIVE STATEMENT:    The lymphedema in my breast is bothering me.  I've been using some heat and used it this morning - I have a skin burn next to shoulder blade.  That's the area where it hurts the most but also goes up some. My neck is a little better.    EVAL I was using the Flexitouch 2 times per week and over time I noticed my breast swelling got worse. I have started doing some manual  lymphatic drainage as well and have been using the machine 4-5 times per week. It has gotten better. I have some cording issues too with some tightness. My breast has been sore at the top of my chest, and medial inferior side, and lateral side.  I have still been wearing the pad in my bra. My breast feels heavy and when I take my bra off it is painful.  I got some new compression bras before Thanksgiving, and one of them is the Wear Ease. I sleep in it every night. I got some cami's and sport bra's  PERTINENT HISTORY:  03/14/2022:Screening detected left breast masses 1.5 cm and 1.3 cm with enlarged left axillary lymph node, biopsy revealed grade 3 IDC with lymph node being positive, ER 95%, PR 0%, HER2 2+ by IHC and FISH positive with  a ratio 2.45 and a copy #4.9, the second biopsy was HER2 negative   1. Neoadjuvant chemotherapy with Herceptin Perjeta and letrozole for 8 cycles.  (Our original recommendation is TCHP x6 cycles but patient is not willing to go through chemo) 2. left lumpectomy and ALND 09/20/2022: 0.6 cm IDC 2/14 lymph nodes, margins negative, ER 95%, PR 0%, HER2 positive 3. Followed by adjuvant radiation therapy  4.  Adjuvant antiestrogen therapy with neratinib She had left lumpectomy on 09/20/2022 with 2/14 LN's  PAIN:  Are you having pain? Yes NPRS scale: 4-5/10 right bilateral rhomboids Pain location: left breast, lateral, and left pectorals Pain orientation: Left and Upper  PAIN TYPE: aching, tight, and sore, heavy Pain description: constant  Aggravating factors: removing bra, movement, walking, jumping,sleeping on left Relieving factors: compression, MLD  PRECAUTIONS: Left UE lymphedema precautions   RED FLAGS: None   WEIGHT BEARING RESTRICTIONS: No  FALLS:  Has patient fallen in last 6 months? No  LIVING ENVIRONMENT: Lives with: partner Sylvester Evert Lives in: House/apartment Has following equipment at home: None  OCCUPATION: speech pathologist;works for [employer]   LEISURE: walking, out to eat  HAND DOMINANCE: right   PRIOR LEVEL OF FUNCTION: Independent  PATIENT GOALS:    OBJECTIVE: Note: Objective measures were completed at Evaluation unless otherwise noted.  COGNITION: Overall cognitive status: Within functional limits for tasks assessed   PALPATION: Tender medial, lateral and proximal left breast, left UT, lats, pectorals  OBSERVATIONS / OTHER ASSESSMENTS:generalized left breast swelling, mild fibrosis near lateral breast incision with tenderness, very few enlarged pores, cording noted left axillary region running below axillary incision to elbow region  SENSATION: Light touch: Deficits    POSTURE:  forward head, rounded shoulders   UPPER EXTREMITY  AROM/PROM:  A/PROM RIGHT   eval   Shoulder extension 57  Shoulder flexion 165  Shoulder abduction 180  Shoulder internal rotation 65  Shoulder external rotation 97    (Blank rows = not tested)  A/PROM LEFT   eval LEFT 11/08/2023 LEFT 12/03/2023 LEFT 12/25/2023  Shoulder extension 50     Shoulder flexion 149 160 160 161  Shoulder abduction 154 168 169 166  Shoulder  internal rotation 48  +     Shoulder external rotation 91       (Blank rows = not tested)  CERVICAL AROM: All within normal limits:   UPPER EXTREMITY STRENGTH:   LYMPHEDEMA ASSESSMENTS:   SURGERY TYPE/DATE:  Left lumpectomy,09/20/2022   NUMBER OF LYMPH NODES REMOVED: 2+/14  CHEMOTHERAPY: No at her request, Did Herceptin, Perjeta and letrozole   RADIATION:Yes, but stopped early at her request  HORMONE TREATMENT: YES  INFECTIONS: NO   LYMPHEDEMA ASSESSMENTS:   LANDMARK RIGHT  eval  At axilla  25.2  15 cm proximal to olecranon process   10 cm proximal to olecranon process 22.7  Olecranon process 21.6  15 cm proximal to ulnar styloid process   10 cm proximal to ulnar styloid process 20.3  Just proximal to ulnar styloid process 14.2  Across hand at thumb web space 19.3  At base of 2nd digit 5.7  (Blank rows = not tested)  LANDMARK LEFT  eval  At axilla  25.2  15 cm proximal to olecranon process   10 cm proximal to olecranon process 22.2  Olecranon process 21.8  15 cm proximal to ulnar styloid process   10 cm proximal to ulnar styloid process 19.1  Just proximal to ulnar styloid process 14.0  Across hand at thumb web space 18.3  At base of 2nd digit 5.9  (Blank rows = not tested)   FUNCTIONAL TESTS:    GAIT: WNL   QUICK DASH SURVEY:   BREAST COMPLAINTS QUESTIONNAIRE(EVAL) Pain:9 Heaviness:8 Swollen feeling:9 Tense Skin:7 Redness:0 Bra Print:10 Size of Pores:8 Hard feeling: 6 Total:   57  /80 A Score over 9 indicates lymphedema issues in the breast    BREAST COMPLAINTS  QUESTIONNAIRE (12/03/2023) Pain:4 Heaviness:3 Swollen feeling:3 Tense Skin1 Redness:0 Bra Print:5 Size of Pores:0 Hard feeling:2  Total:   18  /80 A Score over 9 indicates lymphedema issues in the breast     TODAY'S TREATMENT:  02/28/24: Levator scap stretch review to target this muscle where tender points persist Pinning muscle with lacrosse ball to wall with cervical rotation Verbal review of all HEP and discussed importance of flexibility exercises to maximize benefit.   Trigger Point Dry Needling Subsequent Treatment: Instructions provided previously at initial dry needling treatment.  Patient Verbal Consent Given: Yes Education Handout Provided: Previously Provided Muscles Treated: bil upper traps, bil levator scap, right cervical multifidi marination, Rt suboccipitals,  Rt rhomboids,  Electrical Stimulation Performed: No Treatment Response/Outcome:  improved soft tissue mobility and decreased tender point size and number Elongation and release after dry needling to areas treated.   02/20/2024 Manual traction, suboccipital release prolonged holds  STM bilateral UT/levator/posterior cervicals  and in SL to scapular regions with cocoa butter. Worked under right scapular region with arm in IR for  TP release   02/13/24: Verbal review of all HEP and discussed importance of flexibility exercises to maximize benefit.   Trigger Point Dry Needling Subsequent Treatment: Instructions provided previously at initial dry needling treatment.  Patient Verbal Consent Given: Yes Education Handout Provided: Previously Provided Muscles Treated: bil upper traps, bil levator scap, bil cervical multifidi marination,Rt suboccipitals,  Rt rhomboids,  Electrical Stimulation Performed: No Treatment Response/Outcome:  improved soft tissue mobility and decreased tender point size and number Elongation and release after dry needling to areas treated.   02/05/2024 Manual traction, suboccipital release  prolonged holds with good release STM bilateral UT/levator/posterior cervicals  and in SL to scapular region with cocoa butter Lower trunk  rotation with hip and knee flexion and feet off of table x 5 ea side Open book x 5 B Supine snow angels x10   01/29/24: status update and discussion of symptoms Lacrosse ball against wall pinning against right rhomboids trigger point, infraspinatus, teres tender points Manual therapy: soft tissue mobilization to cervical and bil shoulder musculature Trigger Point Dry Needling Subsequent Treatment: Instructions provided previously at initial dry needling treatment.  Patient Verbal Consent Given: Yes Education Handout Provided: Previously Provided Muscles Treated: bil upper traps, bil levator scap, bil cervical multifidi marination, bil rhomboids, bil teres, left infraspinatus, left subscapularis Electrical Stimulation Performed: No Treatment Response/Outcome:  improved soft tissue mobility and decreased tender point size and number  01/14/2024 Performed SOZO screen;well WNL Manual traction, suboccipital release, STM bilateral UT/levator/posterior cervicals with cocoa butter In supine: Short neck, 5 diaphragmatic breaths, R axillary nodes and establishment of interaxillary pathway, L inguinal nodes and establishment of axilloinguinal pathway, then L breast moving fluid towards pathways spending extra time in any areas of fibrosis then retracing all steps in supine and briefly in Right SL to left lateral breast/trunk and left axillo-inguinal pathway, ending with LN"s Discussed overall progress  PATIENT EDUCATION:  Education details: Discussed POC, treatment interventions. Practiced stargazer stretch and LTR with arms extended to start pectoral stretching and to do at home Person educated: Patient Education method: Explanation Education comprehension: verbalized understanding and returned demonstration  HOME EXERCISE PROGRAM: To start back to stargazer  and LTR to stretch pectorals  ASSESSMENT:  CLINICAL IMPRESSION: The patient benefits significantly from dry needling and manual therapy to stimulate underlying myofascial trigger points and muscular tissue for management of neuromusculoskeletal pain and address movement impairments.  Therapist providing education on performing specific ex's to target certain muscle groups for longer term benefit.    OBJECTIVE IMPAIRMENTS: decreased activity tolerance, decreased knowledge of condition, decreased ROM, decreased strength, increased edema, impaired UE functional use, postural dysfunction, and pain.   ACTIVITY LIMITATIONS: carrying, sleeping, reach over head, and hygiene/grooming  PARTICIPATION LIMITATIONS:  pt is doing all but with some breast pain/discomfort, limitations in left shoulder ROM  PERSONAL FACTORS:  3+ comorbidities: Left breast cancer s/p infusions and radiation  are also affecting patient's functional outcome.    REHAB POTENTIAL: Excellent  CLINICAL DECISION MAKING: Stable/uncomplicated  EVALUATION COMPLEXITY: Low  GOALS: Goals reviewed with patient? Yes  SHORT TERM GOALS=LONG TERM GOALS: Target date: 03/11/24    Pt will be independent in self MLD for reducing left breast edema Baseline: Goal status: MET 12/03/2023  2.  Pt will note decreased breast swelling/tenderness by atleast 50% Baseline:  Goal status: MET 12/25/2023  3.  Pt will be able to lie on her left side to sleep for short periods of time Baseline:  Goal status: IN progress   4.  Pt will have left shoulder ROM  flex and abd Within 5-10 degrees of right without increased pain  for improved reaching Baseline:R  flex 165, abd 180 Goal status: MET flexion 11/08/2023, In Progress abd; regressed since last note  5.  Breast complaints survey will be reduced to no greater than 20 Baseline: 57 Goal status: MET 12/03/2023  6  Pt will have decreased neck/upper quarter pain by 50% overall  Baseline: 25% on Rt, 50%  on Lt (02/13/24)  Goal Status: In Progress   PLAN:  PT FREQUENCY:1x/week  PT DURATION: 4 weeks  PLANNED INTERVENTIONS: 97164- PT Re-evaluation, 97110-Therapeutic exercises, [id number]- Neuromuscular re-education, 97535- Self Care, 16109- Manual therapy, 97760- Orthotic Fit/training, [id number]-  Vasopneumatic device, Patient/Family education, Manual lymph drainage, Scar mobilization, Therapeutic exercises, Therapeutic activity, Neuromuscular re-education, and Self Care, Dry needling.  PLAN FOR NEXT SESSION: Progress strength, STM left UQ,  MFR cording left,update HEP with stretches, PROM left, Left breast MLD  Darien Eden, PT 02/28/24 3:13 PM Phone: [phone] Fax: [phone]

## 2024-03-05 ENCOUNTER — Ambulatory Visit

## 2024-03-06 ENCOUNTER — Ambulatory Visit: Admitting: Rehabilitation

## 2024-03-06 ENCOUNTER — Encounter: Payer: Self-pay | Admitting: Rehabilitation

## 2024-03-06 DIAGNOSIS — C50412 Malignant neoplasm of upper-outer quadrant of left female breast: Secondary | ICD-10-CM | POA: Diagnosis not present

## 2024-03-06 DIAGNOSIS — Z483 Aftercare following surgery for neoplasm: Secondary | ICD-10-CM

## 2024-03-06 DIAGNOSIS — R293 Abnormal posture: Secondary | ICD-10-CM

## 2024-03-06 DIAGNOSIS — R252 Cramp and spasm: Secondary | ICD-10-CM

## 2024-03-06 DIAGNOSIS — I890 Lymphedema, not elsewhere classified: Secondary | ICD-10-CM

## 2024-03-06 DIAGNOSIS — M25612 Stiffness of left shoulder, not elsewhere classified: Secondary | ICD-10-CM

## 2024-03-06 DIAGNOSIS — M6281 Muscle weakness (generalized): Secondary | ICD-10-CM

## 2024-03-06 NOTE — Therapy (Addendum)
 OUTPATIENT PHYSICAL THERAPY  UPPER EXTREMITY ONCOLOGY  Patient Name: Caitlyn Torres MRN: [id number] DOB:1961/01/15, 63 y.o., female Today's Date: 03/06/2024  END OF SESSION:  PT End of Session - 03/06/24 1237     Visit Number 24    Number of Visits 28    Date for PT Re-Evaluation 03/11/24    PT Start Time 0902    PT Stop Time 0955    PT Time Calculation (min) 53 min    Activity Tolerance Patient tolerated treatment well    Behavior During Therapy [facility] for tasks assessed/performed                 Past Medical History:  Diagnosis Date   Anxiety    Arthritis    Cancer (HCC) 03/2022   L breast   Depression    Family history of breast cancer 05/15/2022   Family history of pancreatic cancer 05/15/2022   Family history of prostate cancer 05/15/2022   GERD (gastroesophageal reflux disease)    History of hiatal hernia    patient believes she may have been told she has this years ago   Past Surgical History:  Procedure Laterality Date   BREAST BIOPSY  09/19/2022   MM LT RADIOACTIVE SEED EA ADD LESION LOC MAMMO GUIDE 09/19/2022 [facility]   BREAST BIOPSY  09/19/2022   MM LT RADIOACTIVE SEED LOC MAMMO GUIDE 09/19/2022 [facility]   BREAST LUMPECTOMY WITH RADIOACTIVE SEED AND AXILLARY LYMPH NODE DISSECTION Left 09/20/2022   Procedure: LEFT BREAST SEED LUMPECTOMY, LEFT AXILLARY LYMPH NODE DISSECTION;  Surgeon: Sim Dryer, MD;  Location: [facility] OR;  Service: General;  Laterality: Left;  GEN & PEC BLOCK   CERVIX SURGERY     Laser removal of pre-cancer cells   LASIK Bilateral    PORTACATH PLACEMENT Right 04/20/2022   Procedure: PORT PLACEMENT;  Surgeon: Sim Dryer, MD;  Location: [facility] OR;  Service: General;  Laterality: Right;   Patient Active Problem List   Diagnosis Date Noted   Other fatigue 08/28/2022   Acute pain 08/28/2022   Genetic testing 06/01/2022   Family history of breast cancer 05/15/2022   Family history of pancreatic cancer 05/15/2022    Family history of prostate cancer 05/15/2022   Port-A-Cath in place 04/28/2022   Malignant neoplasm of upper-outer quadrant of left breast in female, estrogen receptor positive (HCC) 03/29/2022    PCP:   REFERRING PROVIDER: Cameron Cea, MD   REFERRING DIAG: Left Breast lymphedema/cording  THERAPY DIAG:  Stiffness of left shoulder, not elsewhere classified  Malignant neoplasm of upper-outer quadrant of left female breast, unspecified estrogen receptor status (HCC)  Cramp and spasm  Abnormal posture  Aftercare following surgery for neoplasm  Lymphedema of breast  Muscle weakness (generalized)  ONSET DATE: March but exacerbated end of November  Rationale for Evaluation and Treatment: Rehabilitation  SUBJECTIVE:  SUBJECTIVE STATEMENT:     I have not been doing a lot of the lymph massage.  I have only used the pump 1x this week.  The Left armpit is really just more sore.    EVAL I was using the Flexitouch 2 times per week and over time I noticed my breast swelling got worse. I have started doing some manual  lymphatic drainage as well and have been using the machine 4-5 times per week. It has gotten better. I have some cording issues too with some tightness. My breast has been sore at the top of my chest, and medial inferior side, and lateral side.  I have still been wearing the pad in my bra. My breast feels heavy and when I take my bra off it is painful.  I got some new compression bras before Thanksgiving, and one of them is the Wear Ease. I sleep in it every night. I got some cami's and sport bra's  PERTINENT HISTORY:  03/14/2022:Screening detected left breast masses 1.5 cm and 1.3 cm with enlarged left axillary lymph node, biopsy revealed grade 3 IDC with lymph node being positive, ER 95%, PR 0%,  HER2 2+ by IHC and FISH positive with a ratio 2.45 and a copy #4.9, the second biopsy was HER2 negative   1. Neoadjuvant chemotherapy with Herceptin  Perjeta  and letrozole  for 8 cycles.  (Our original recommendation is TCHP x6 cycles but patient is not willing to go through chemo) 2. left lumpectomy and ALND 09/20/2022: 0.6 cm IDC 2/14 lymph nodes, margins negative, ER 95%, PR 0%, HER2 positive 3. Followed by adjuvant radiation therapy  4.  Adjuvant antiestrogen therapy with neratinib She had left lumpectomy on 09/20/2022 with 2/14 LN's  PAIN:  Are you having pain? Yes NPRS scale: 4-5/10  Pain location: left breast, lateral, and left pectorals Pain orientation: Left and Upper  PAIN TYPE: aching, tight, and sore, heavy Pain description: constant  Aggravating factors: removing bra, movement, walking, jumping,sleeping on left Relieving factors: compression, MLD  PRECAUTIONS: Left UE lymphedema precautions   RED FLAGS: None   WEIGHT BEARING RESTRICTIONS: No  FALLS:  Has patient fallen in last 6 months? No  LIVING ENVIRONMENT: Lives with: partner Sylvester Evert Lives in: House/apartment Has following equipment at home: None  OCCUPATION: speech pathologist;works for [employer]   LEISURE: walking, out to eat  HAND DOMINANCE: right   PRIOR LEVEL OF FUNCTION: Independent  PATIENT GOALS:    OBJECTIVE: Note: Objective measures were completed at Evaluation unless otherwise noted.  COGNITION: Overall cognitive status: Within functional limits for tasks assessed   PALPATION: Tender medial, lateral and proximal left breast, left UT, lats, pectorals  OBSERVATIONS / OTHER ASSESSMENTS:generalized left breast swelling, mild fibrosis near lateral breast incision with tenderness, very few enlarged pores, cording noted left axillary region running below axillary incision to elbow region  SENSATION: Light touch: Deficits    POSTURE:  forward head, rounded shoulders   UPPER EXTREMITY  AROM/PROM:  A/PROM RIGHT   eval   Shoulder extension 57  Shoulder flexion 165  Shoulder abduction 180  Shoulder internal rotation 65  Shoulder external rotation 97    (Blank rows = not tested)  A/PROM LEFT   eval LEFT 11/08/2023 LEFT 12/03/2023 LEFT 12/25/2023  Shoulder extension 50     Shoulder flexion 149 160 160 161  Shoulder abduction 154 168 169 166  Shoulder internal rotation 48  +     Shoulder external rotation 91       (Blank  rows = not tested)  CERVICAL AROM: All within normal limits:   UPPER EXTREMITY STRENGTH:   LYMPHEDEMA ASSESSMENTS:   SURGERY TYPE/DATE:  Left lumpectomy,09/20/2022   NUMBER OF LYMPH NODES REMOVED: 2+/14  CHEMOTHERAPY: No at her request, Did Herceptin , Perjeta  and letrozole    RADIATION:Yes, but stopped early at her request  HORMONE TREATMENT: YES  INFECTIONS: NO   LYMPHEDEMA ASSESSMENTS:   LANDMARK RIGHT  eval  At axilla  25.2  15 cm proximal to olecranon process   10 cm proximal to olecranon process 22.7  Olecranon process 21.6  15 cm proximal to ulnar styloid process   10 cm proximal to ulnar styloid process 20.3  Just proximal to ulnar styloid process 14.2  Across hand at thumb web space 19.3  At base of 2nd digit 5.7  (Blank rows = not tested)  LANDMARK LEFT  eval  At axilla  25.2  15 cm proximal to olecranon process   10 cm proximal to olecranon process 22.2  Olecranon process 21.8  15 cm proximal to ulnar styloid process   10 cm proximal to ulnar styloid process 19.1  Just proximal to ulnar styloid process 14.0  Across hand at thumb web space 18.3  At base of 2nd digit 5.9  (Blank rows = not tested)   FUNCTIONAL TESTS:    GAIT: WNL   QUICK DASH SURVEY:   BREAST COMPLAINTS QUESTIONNAIRE(EVAL) Pain:9 Heaviness:8 Swollen feeling:9 Tense Skin:7 Redness:0 Bra Print:10 Size of Pores:8 Hard feeling: 6 Total:   57  /80 A Score over 9 indicates lymphedema issues in the breast    BREAST COMPLAINTS  QUESTIONNAIRE (12/03/2023) Pain:4 Heaviness:3 Swollen feeling:3 Tense Skin1 Redness:0 Bra Print:5 Size of Pores:0 Hard feeling:2  Total:   18  /80 A Score over 9 indicates lymphedema issues in the breast     TODAY'S TREATMENT:  03/06/24 Manual traction, suboccipital release, STM bilateral UT/levator/posterior cervicals with cocoa butter Used wave tool in sidelying to rhomboids Showed pt theracane In supine: Short neck, 5 diaphragmatic breaths, R axillary nodes and establishment of interaxillary pathway, L inguinal nodes and establishment of axilloinguinal pathway, then L breast moving fluid towards pathways spending extra time in any areas of fibrosis then retracing all steps in supine and briefly in Right SL to left lateral breast/trunk and left axillo-inguinal pathway, ending with LN"s  02/28/24: Levator scap stretch review to target this muscle where tender points persist Pinning muscle with lacrosse ball to wall with cervical rotation Verbal review of all HEP and discussed importance of flexibility exercises to maximize benefit.   Trigger Point Dry Needling Subsequent Treatment: Instructions provided previously at initial dry needling treatment.  Patient Verbal Consent Given: Yes Education Handout Provided: Previously Provided Muscles Treated: bil upper traps, bil levator scap, right cervical multifidi marination, Rt suboccipitals,  Rt rhomboids,  Electrical Stimulation Performed: No Treatment Response/Outcome:  improved soft tissue mobility and decreased tender point size and number Elongation and release after dry needling to areas treated.   02/20/2024 Manual traction, suboccipital release prolonged holds  STM bilateral UT/levator/posterior cervicals  and in SL to scapular regions with cocoa butter. Worked under right scapular region with arm in IR for  TP release  02/13/24: Verbal review of all HEP and discussed importance of flexibility exercises to maximize benefit.    Trigger Point Dry Needling Subsequent Treatment: Instructions provided previously at initial dry needling treatment.  Patient Verbal Consent Given: Yes Education Handout Provided: Previously Provided Muscles Treated: bil upper traps, bil levator scap, bil cervical  multifidi marination,Rt suboccipitals,  Rt rhomboids,  Electrical Stimulation Performed: No Treatment Response/Outcome:  improved soft tissue mobility and decreased tender point size and number Elongation and release after dry needling to areas treated.   02/05/2024 Manual traction, suboccipital release prolonged holds with good release STM bilateral UT/levator/posterior cervicals  and in SL to scapular region with cocoa butter Lower trunk rotation with hip and knee flexion and feet off of table x 5 ea side Open book x 5 B Supine snow angels x10   01/29/24: status update and discussion of symptoms Lacrosse ball against wall pinning against right rhomboids trigger point, infraspinatus, teres tender points Manual therapy: soft tissue mobilization to cervical and bil shoulder musculature Trigger Point Dry Needling Subsequent Treatment: Instructions provided previously at initial dry needling treatment.  Patient Verbal Consent Given: Yes Education Handout Provided: Previously Provided Muscles Treated: bil upper traps, bil levator scap, bil cervical multifidi marination, bil rhomboids, bil teres, left infraspinatus, left subscapularis Electrical Stimulation Performed: No Treatment Response/Outcome:  improved soft tissue mobility and decreased tender point size and number  01/14/2024 Performed SOZO screen;well WNL Manual traction, suboccipital release, STM bilateral UT/levator/posterior cervicals with cocoa butter In supine: Short neck, 5 diaphragmatic breaths, R axillary nodes and establishment of interaxillary pathway, L inguinal nodes and establishment of axilloinguinal pathway, then L breast moving fluid towards pathways  spending extra time in any areas of fibrosis then retracing all steps in supine and briefly in Right SL to left lateral breast/trunk and left axillo-inguinal pathway, ending with LN"s Discussed overall progress  PATIENT EDUCATION:  Education details: Discussed POC, treatment interventions. Practiced stargazer stretch and LTR with arms extended to start pectoral stretching and to do at home Person educated: Patient Education method: Explanation Education comprehension: verbalized understanding and returned demonstration  HOME EXERCISE PROGRAM: To start back to stargazer and LTR to stretch pectorals  ASSESSMENT:  CLINICAL IMPRESSION: Pt continues with neck and shoulder blade pain that improves with dry needling and MT.  1 visit remaining for goal check and POC discussion.    OBJECTIVE IMPAIRMENTS: decreased activity tolerance, decreased knowledge of condition, decreased ROM, decreased strength, increased edema, impaired UE functional use, postural dysfunction, and pain.   ACTIVITY LIMITATIONS: carrying, sleeping, reach over head, and hygiene/grooming  PARTICIPATION LIMITATIONS:  pt is doing all but with some breast pain/discomfort, limitations in left shoulder ROM  PERSONAL FACTORS:  3+ comorbidities: Left breast cancer s/p infusions and radiation  are also affecting patient's functional outcome.    REHAB POTENTIAL: Excellent  CLINICAL DECISION MAKING: Stable/uncomplicated  EVALUATION COMPLEXITY: Low  GOALS: Goals reviewed with patient? Yes  SHORT TERM GOALS=LONG TERM GOALS: Target date: 03/11/24    Pt will be independent in self MLD for reducing left breast edema Baseline: Goal status: MET 12/03/2023  2.  Pt will note decreased breast swelling/tenderness by atleast 50% Baseline:  Goal status: MET 12/25/2023  3.  Pt will be able to lie on her left side to sleep for short periods of time Baseline:  Goal status: IN progress   4.  Pt will have left shoulder ROM  flex and abd  Within 5-10 degrees of right without increased pain  for improved reaching Baseline:R  flex 165, abd 180 Goal status: MET flexion 11/08/2023, In Progress abd; regressed since last note  5.  Breast complaints survey will be reduced to no greater than 20 Baseline: 57 Goal status: MET 12/03/2023  6  Pt will have decreased neck/upper quarter pain by 50% overall  Baseline: 25% on  Rt, 50% on Lt (02/13/24)  Goal Status: In Progress   PLAN:  PT FREQUENCY:1x/week  PT DURATION: 4 weeks  PLANNED INTERVENTIONS: 97164- PT Re-evaluation, 97110-Therapeutic exercises, 97112- Neuromuscular re-education, 97535- Self Care, 65784- Manual therapy, 97760- Orthotic Fit/training, 97016- Vasopneumatic device, Patient/Family education, Manual lymph drainage, Scar mobilization, Therapeutic exercises, Therapeutic activity, Neuromuscular re-education, and Self Care, Dry needling.  PLAN FOR NEXT SESSION: Progress strength, STM left UQ,  MFR cording left,update HEP with stretches, PROM left, Left breast MLD  Judy Null, PT  03/06/24 12:38 PM Phone: [phone] Fax: [phone]

## 2024-03-10 ENCOUNTER — Ambulatory Visit

## 2024-03-10 ENCOUNTER — Encounter

## 2024-03-10 DIAGNOSIS — R293 Abnormal posture: Secondary | ICD-10-CM

## 2024-03-10 DIAGNOSIS — I890 Lymphedema, not elsewhere classified: Secondary | ICD-10-CM

## 2024-03-10 DIAGNOSIS — M25612 Stiffness of left shoulder, not elsewhere classified: Secondary | ICD-10-CM

## 2024-03-10 DIAGNOSIS — M6281 Muscle weakness (generalized): Secondary | ICD-10-CM

## 2024-03-10 DIAGNOSIS — C50412 Malignant neoplasm of upper-outer quadrant of left female breast: Secondary | ICD-10-CM

## 2024-03-10 DIAGNOSIS — Z483 Aftercare following surgery for neoplasm: Secondary | ICD-10-CM

## 2024-03-10 DIAGNOSIS — R252 Cramp and spasm: Secondary | ICD-10-CM

## 2024-03-10 NOTE — Therapy (Signed)
 OUTPATIENT PHYSICAL THERAPY  UPPER EXTREMITY ONCOLOGY  Patient Name: Caitlyn Torres MRN: [id number] DOB:09/30/1961, 63 y.o., female Today's Date: 03/10/2024  END OF SESSION:  PT End of Session - 03/10/24 0907     Visit Number 25    Number of Visits 28    Date for PT Re-Evaluation 03/11/24    Authorization Type [employer]    PT Start Time 0907    PT Stop Time 0956    PT Time Calculation (min) 49 min    Activity Tolerance Patient tolerated treatment well    Behavior During Therapy [facility] for tasks assessed/performed                 Past Medical History:  Diagnosis Date   Anxiety    Arthritis    Cancer (HCC) 03/2022   L breast   Depression    Family history of breast cancer 05/15/2022   Family history of pancreatic cancer 05/15/2022   Family history of prostate cancer 05/15/2022   GERD (gastroesophageal reflux disease)    History of hiatal hernia    patient believes she may have been told she has this years ago   Past Surgical History:  Procedure Laterality Date   BREAST BIOPSY  09/19/2022   MM LT RADIOACTIVE SEED EA ADD LESION LOC MAMMO GUIDE 09/19/2022 [facility]AP[facility]   BREAST BIOPSY  09/19/2022   MM LT RADIOACTIVE SEED LOC MAMMO GUIDE 09/19/2022 [facility]MO[facility]   BREAST LUMPECTOMY WITH RADIOACTIVE SEED AND AXILLARY LYMPH NODE DISSECTION Left 09/20/2022   Procedure: LEFT BREAST SEED LUMPECTOMY, LEFT AXILLARY LYMPH NODE DISSECTION;  Surgeon: Sim Dryer, MD;  Location: [facility] OR;  Service: General;  Laterality: Left;  GEN & PEC BLOCK   CERVIX SURGERY     Laser removal of pre-cancer cells   LASIK Bilateral    PORTACATH PLACEMENT Right 04/20/2022   Procedure: PORT PLACEMENT;  Surgeon: Sim Dryer, MD;  Location: [facility] OR;  Service: General;  Laterality: Right;   Patient Active Problem List   Diagnosis Date Noted   Other fatigue 08/28/2022   Acute pain 08/28/2022   Genetic testing 06/01/2022   Family history of breast cancer 05/15/2022   Family history of  pancreatic cancer 05/15/2022   Family history of prostate cancer 05/15/2022   Port-A-Cath in place 04/28/2022   Malignant neoplasm of upper-outer quadrant of left breast in female, estrogen receptor positive (HCC) 03/29/2022    PCP:   REFERRING PROVIDER: Cameron Cea, MD   REFERRING DIAG: Left Breast lymphedema/cording  THERAPY DIAG:  Stiffness of left shoulder, not elsewhere classified  Malignant neoplasm of upper-outer quadrant of left female breast, unspecified estrogen receptor status (HCC)  Cramp and spasm  Abnormal posture  Aftercare following surgery for neoplasm  Lymphedema of breast  Muscle weakness (generalized)  ONSET DATE: March but exacerbated end of November  Rationale for Evaluation and Treatment: Rehabilitation  SUBJECTIVE:  SUBJECTIVE STATEMENT:     My breast is doing very well overall. My back and shoulders on the right bother me the most, but it is improved from where it was.  My back and shoulders are 50% better overall. I have been using a heating pad and a massager some.  I have been sporadic with MLD due to time limitations.  EVAL I was using the Flexitouch 2 times per week and over time I noticed my breast swelling got worse. I have started doing some manual  lymphatic drainage as well and have been using the machine 4-5 times per week. It has gotten better. I have some cording issues too with some tightness. My breast has been sore at the top of my chest, and medial inferior side, and lateral side.  I have still been wearing the pad in my bra. My breast feels heavy and when I take my bra off it is painful.  I got some new compression bras before Thanksgiving, and one of them is the Wear Ease. I sleep in it every night. I got some cami's and sport bra's  PERTINENT  HISTORY:  03/14/2022:Screening detected left breast masses 1.5 cm and 1.3 cm with enlarged left axillary lymph node, biopsy revealed grade 3 IDC with lymph node being positive, ER 95%, PR 0%, HER2 2+ by IHC and FISH positive with a ratio 2.45 and a copy #4.9, the second biopsy was HER2 negative   1. Neoadjuvant chemotherapy with Herceptin  Perjeta  and letrozole  for 8 cycles.  (Our original recommendation is TCHP x6 cycles but patient is not willing to go through chemo) 2. left lumpectomy and ALND 09/20/2022: 0.6 cm IDC 2/14 lymph nodes, margins negative, ER 95%, PR 0%, HER2 positive 3. Followed by adjuvant radiation therapy  4.  Adjuvant antiestrogen therapy with neratinib She had left lumpectomy on 09/20/2022 with 2/14 LN's  PAIN:  Are you having pain? Yes NPRS scale: 4-5/10  Pain location: left breast, lateral, and left pectorals Pain orientation: Left and Upper  PAIN TYPE: aching, tight, and sore, heavy Pain description: constant  Aggravating factors: removing bra, movement, walking, jumping,sleeping on left Relieving factors: compression, MLD  PRECAUTIONS: Left UE lymphedema precautions   RED FLAGS: None   WEIGHT BEARING RESTRICTIONS: No  FALLS:  Has patient fallen in last 6 months? No  LIVING ENVIRONMENT: Lives with: partner Sylvester Evert Lives in: House/apartment Has following equipment at home: None  OCCUPATION: speech pathologist;works for [employer]   LEISURE: walking, out to eat  HAND DOMINANCE: right   PRIOR LEVEL OF FUNCTION: Independent  PATIENT GOALS:    OBJECTIVE: Note: Objective measures were completed at Evaluation unless otherwise noted.  COGNITION: Overall cognitive status: Within functional limits for tasks assessed   PALPATION: Tender medial, lateral and proximal left breast, left UT, lats, pectorals  OBSERVATIONS / OTHER ASSESSMENTS:generalized left breast swelling, mild fibrosis near lateral breast incision with tenderness, very few enlarged  pores, cording noted left axillary region running below axillary incision to elbow region  SENSATION: Light touch: Deficits    POSTURE:  forward head, rounded shoulders   UPPER EXTREMITY AROM/PROM:  A/PROM RIGHT   eval   Shoulder extension 57  Shoulder flexion 165  Shoulder abduction 180  Shoulder internal rotation 65  Shoulder external rotation 97    (Blank rows = not tested)  A/PROM LEFT   eval LEFT 11/08/2023 LEFT 12/03/2023 LEFT 12/25/2023 LEFT 03/10/2024  Shoulder extension 50      Shoulder flexion 149 160 160 161 162  Shoulder abduction 154 168 169 166 170  Shoulder internal rotation 48  +      Shoulder external rotation 91        (Blank rows = not tested)  CERVICAL AROM: All within normal limits:   UPPER EXTREMITY STRENGTH:   LYMPHEDEMA ASSESSMENTS:   SURGERY TYPE/DATE:  Left lumpectomy,09/20/2022   NUMBER OF LYMPH NODES REMOVED: 2+/14  CHEMOTHERAPY: No at her request, Did Herceptin , Perjeta  and letrozole    RADIATION:Yes, but stopped early at her request  HORMONE TREATMENT: YES  INFECTIONS: NO   LYMPHEDEMA ASSESSMENTS:   LANDMARK RIGHT  eval  At axilla  25.2  15 cm proximal to olecranon process   10 cm proximal to olecranon process 22.7  Olecranon process 21.6  15 cm proximal to ulnar styloid process   10 cm proximal to ulnar styloid process 20.3  Just proximal to ulnar styloid process 14.2  Across hand at thumb web space 19.3  At base of 2nd digit 5.7  (Blank rows = not tested)  LANDMARK LEFT  eval  At axilla  25.2  15 cm proximal to olecranon process   10 cm proximal to olecranon process 22.2  Olecranon process 21.8  15 cm proximal to ulnar styloid process   10 cm proximal to ulnar styloid process 19.1  Just proximal to ulnar styloid process 14.0  Across hand at thumb web space 18.3  At base of 2nd digit 5.9  (Blank rows = not tested)   FUNCTIONAL TESTS:    GAIT: WNL   QUICK DASH SURVEY:   BREAST COMPLAINTS  QUESTIONNAIRE(EVAL) Pain:9 Heaviness:8 Swollen feeling:9 Tense Skin:7 Redness:0 Bra Print:10 Size of Pores:8 Hard feeling: 6 Total:   57  /80 A Score over 9 indicates lymphedema issues in the breast    BREAST COMPLAINTS QUESTIONNAIRE (12/03/2023) Pain:4 Heaviness:3 Swollen feeling:3 Tense Skin1 Redness:0 Bra Print:5 Size of Pores:0 Hard feeling:2  Total:   18  /80 A Score over 9 indicates lymphedema issues in the breast  BREAST COMPLAINTS QUESTIONNAIRE(03/10/2024) Pain:1 Heaviness:2 Swollen feeling:1 Tense Skin:1 Redness:0 Bra Print:4 Size of Pores:0 Hard feeling: 2 Total:   11  /80 A Score over 9 indicates lymphedema issues in the breast    TODAY'S TREATMENT:  03/10/2024 Discussed progress toward goals and measured AROM Manual traction, suboccipital release, STM bilateral UT/levator/posterior cervcals in supine and SL  with cocoa butter Used wave tool and hands in supine and SL  03/06/24 Manual traction, suboccipital release, STM bilateral UT/levator/posterior cervicals with cocoa butter Used wave tool in sidelying to rhomboids Showed pt theracane In supine: Short neck, 5 diaphragmatic breaths, R axillary nodes and establishment of interaxillary pathway, L inguinal nodes and establishment of axilloinguinal pathway, then L breast moving fluid towards pathways spending extra time in any areas of fibrosis then retracing all steps in supine and briefly in Right SL to left lateral breast/trunk and left axillo-inguinal pathway, ending with LN"s  02/28/24: Levator scap stretch review to target this muscle where tender points persist Pinning muscle with lacrosse ball to wall with cervical rotation Verbal review of all HEP and discussed importance of flexibility exercises to maximize benefit.   Trigger Point Dry Needling Subsequent Treatment: Instructions provided previously at initial dry needling treatment.  Patient Verbal Consent Given: Yes Education Handout  Provided: Previously Provided Muscles Treated: bil upper traps, bil levator scap, right cervical multifidi marination, Rt suboccipitals,  Rt rhomboids,  Electrical Stimulation Performed: No Treatment Response/Outcome:  improved soft tissue mobility and decreased tender point size and number  Elongation and release after dry needling to areas treated.   02/20/2024 Manual traction, suboccipital release prolonged holds  STM bilateral UT/levator/posterior cervicals  and in SL to scapular regions with cocoa butter. Worked under right scapular region with arm in IR for  TP release  02/13/24: Verbal review of all HEP and discussed importance of flexibility exercises to maximize benefit.   Trigger Point Dry Needling Subsequent Treatment: Instructions provided previously at initial dry needling treatment.  Patient Verbal Consent Given: Yes Education Handout Provided: Previously Provided Muscles Treated: bil upper traps, bil levator scap, bil cervical multifidi marination,Rt suboccipitals,  Rt rhomboids,  Electrical Stimulation Performed: No Treatment Response/Outcome:  improved soft tissue mobility and decreased tender point size and number Elongation and release after dry needling to areas treated.   02/05/2024 Manual traction, suboccipital release prolonged holds with good release STM bilateral UT/levator/posterior cervicals  and in SL to scapular region with cocoa butter Lower trunk rotation with hip and knee flexion and feet off of table x 5 ea side Open book x 5 B Supine snow angels x10   01/29/24: status update and discussion of symptoms Lacrosse ball against wall pinning against right rhomboids trigger point, infraspinatus, teres tender points Manual therapy: soft tissue mobilization to cervical and bil shoulder musculature Trigger Point Dry Needling Subsequent Treatment: Instructions provided previously at initial dry needling treatment.  Patient Verbal Consent Given: Yes Education  Handout Provided: Previously Provided Muscles Treated: bil upper traps, bil levator scap, bil cervical multifidi marination, bil rhomboids, bil teres, left infraspinatus, left subscapularis Electrical Stimulation Performed: No Treatment Response/Outcome:  improved soft tissue mobility and decreased tender point size and number  01/14/2024 Performed SOZO screen;well WNL Manual traction, suboccipital release, STM bilateral UT/levator/posterior cervicals with cocoa butter In supine: Short neck, 5 diaphragmatic breaths, R axillary nodes and establishment of interaxillary pathway, L inguinal nodes and establishment of axilloinguinal pathway, then L breast moving fluid towards pathways spending extra time in any areas of fibrosis then retracing all steps in supine and briefly in Right SL to left lateral breast/trunk and left axillo-inguinal pathway, ending with LN"s Discussed overall progress  PATIENT EDUCATION:  Education details: Discussed POC, treatment interventions. Practiced stargazer stretch and LTR with arms extended to start pectoral stretching and to do at home Person educated: Patient Education method: Explanation Education comprehension: verbalized understanding and returned demonstration  HOME EXERCISE PROGRAM: To start back to stargazer and LTR to stretch pectorals  ASSESSMENT:  CLINICAL IMPRESSION: Pt continues with neck and shoulder blade pain that improves with dry needling and MT.  She has achieved all goals established, and she has been instructed in self care techniques. She demonstrated good increase in left shoulder AROM and pain is 50% better overall. She feels ready to be released to a HEP   OBJECTIVE IMPAIRMENTS: decreased activity tolerance, decreased knowledge of condition, decreased ROM, decreased strength, increased edema, impaired UE functional use, postural dysfunction, and pain.   ACTIVITY LIMITATIONS: carrying, sleeping, reach over head, and  hygiene/grooming  PARTICIPATION LIMITATIONS:  pt is doing all but with some breast pain/discomfort, limitations in left shoulder ROM  PERSONAL FACTORS:  3+ comorbidities: Left breast cancer s/p infusions and radiation  are also affecting patient's functional outcome.    REHAB POTENTIAL: Excellent  CLINICAL DECISION MAKING: Stable/uncomplicated  EVALUATION COMPLEXITY: Low  GOALS: Goals reviewed with patient? Yes  SHORT TERM GOALS=LONG TERM GOALS: Target date: 03/11/24    Pt will be independent in self MLD for reducing left breast edema Baseline: Goal status:  MET 12/03/2023  2.  Pt will note decreased breast swelling/tenderness by atleast 50% Baseline:  Goal status: MET 12/25/2023  3.  Pt will be able to lie on her left side to sleep for short periods of time Baseline:  Goal status:MET 03/10/2024, but makes her breast more sore 4.  Pt will have left shoulder ROM  flex and abd Within 5-10 degrees of right without increased pain  for improved reaching Baseline:R  flex 165, abd 180 Goal status: MET flexion 11/08/2023, In Progress abd; regressed since last note  5.  Breast complaints survey will be reduced to no greater than 20 Baseline: 57 Goal status: MET 12/03/2023  6  Pt will have decreased neck/upper quarter pain by 50% overall  Baseline: 25% on Rt, 50% on Lt (02/13/24)  Goal Status: MET 03/10/2024 PLAN:  PT FREQUENCY:1x/week  PT DURATION: 4 weeks  PLANNED INTERVENTIONS: 16109- PT Re-evaluation, 97110-Therapeutic exercises, 97112- Neuromuscular re-education, 97535- Self Care, 60454- Manual therapy, 97760- Orthotic Fit/training, 97016- Vasopneumatic device, Patient/Family education, Manual lymph drainage, Scar mobilization, Therapeutic exercises, Therapeutic activity, Neuromuscular re-education, and Self Care, Dry needling.  PLAN FOR NEXT SESSION: Progress strength, STM left UQ,  MFR cording left,update HEP with stretches, PROM left, Left breast MLD PHYSICAL THERAPY DISCHARGE  SUMMARY  Visits from Start of Care: 25  Current functional level related to goals / functional outcomes: Achieved all goals   Remaining deficits: Pt still has tightness and tenderness right greater than left UT/levator/rhomboids   Education / Equipment: HEP   Patient agrees to discharge. Patient goals were met. Patient is being discharged due to meeting the stated rehab goals.  George Kinder, PT  03/10/24 10:01 AM Phone: [phone] Fax: [phone]

## 2024-05-03 ENCOUNTER — Other Ambulatory Visit: Payer: Self-pay

## 2024-05-05 ENCOUNTER — Ambulatory Visit: Attending: Hematology and Oncology

## 2024-05-19 ENCOUNTER — Ambulatory Visit: Attending: Hematology and Oncology | Admitting: Rehabilitation

## 2024-05-19 ENCOUNTER — Encounter: Payer: Self-pay | Admitting: Rehabilitation

## 2024-05-19 DIAGNOSIS — Z483 Aftercare following surgery for neoplasm: Secondary | ICD-10-CM | POA: Insufficient documentation

## 2024-05-19 DIAGNOSIS — C50412 Malignant neoplasm of upper-outer quadrant of left female breast: Secondary | ICD-10-CM | POA: Insufficient documentation

## 2024-05-19 NOTE — Therapy (Signed)
 OUTPATIENT PHYSICAL THERAPY SOZO SCREENING NOTE   Patient Name: Caitlyn Torres MRN: [id number] DOB:[birth year], 63 y.o., female Today's Date: 05/19/2024  PCP: No primary care provider on file. REFERRING PROVIDER: Odean Potts, MD   PT End of Session - 05/19/24 1704     Visit Number 25   screen only   PT Start Time 1600    PT Stop Time 1604    PT Time Calculation (min) 4 min    Activity Tolerance Patient tolerated treatment well    Behavior During Therapy [facility] for tasks assessed/performed          Past Medical History:  Diagnosis Date   Anxiety    Arthritis    Cancer (HCC) 03/2022   L breast   Depression    Family history of breast cancer 05/15/2022   Family history of pancreatic cancer 05/15/2022   Family history of prostate cancer 05/15/2022   GERD (gastroesophageal reflux disease)    History of hiatal hernia    patient believes she may have been told she has this years ago   Past Surgical History:  Procedure Laterality Date   BREAST BIOPSY  09/19/2022   MM LT RADIOACTIVE SEED EA ADD LESION LOC MAMMO GUIDE 09/19/2022 [facility]   BREAST BIOPSY  09/19/2022   MM LT RADIOACTIVE SEED LOC MAMMO GUIDE 09/19/2022 [facility]   BREAST LUMPECTOMY WITH RADIOACTIVE SEED AND AXILLARY LYMPH NODE DISSECTION Left 09/20/2022   Procedure: LEFT BREAST SEED LUMPECTOMY, LEFT AXILLARY LYMPH NODE DISSECTION;  Surgeon: Vanderbilt Ned, MD;  Location: [facility];  Service: General;  Laterality: Left;  GEN & PEC BLOCK   CERVIX SURGERY     Laser removal of pre-cancer cells   LASIK Bilateral    PORTACATH PLACEMENT Right 04/20/2022   Procedure: PORT PLACEMENT;  Surgeon: Vanderbilt Ned, MD;  Location: [facility];  Service: General;  Laterality: Right;   Patient Active Problem List   Diagnosis Date Noted   Other fatigue 08/28/2022   Acute pain 08/28/2022   Genetic testing 06/01/2022   Family history of breast cancer 05/15/2022   Family history of pancreatic cancer 05/15/2022   Family  history of prostate cancer 05/15/2022   Port-A-Cath in place 04/28/2022   Malignant neoplasm of upper-outer quadrant of left breast in female, estrogen receptor positive (HCC) 03/29/2022    REFERRING DIAG: left breast cancer at risk for lymphedema  THERAPY DIAG:  Malignant neoplasm of upper-outer quadrant of left female breast, unspecified estrogen receptor status (HCC)  Aftercare following surgery for neoplasm  PERTINENT HISTORY: 03/14/2022:Screening detected left breast masses 1.5 cm and 1.3 cm with enlarged left axillary lymph node, biopsy revealed grade 3 IDC with lymph node being positive, ER 95%, PR 0%, HER2 2+ by IHC and FISH positive with a ratio 2.45 and a copy #4.9, the second biopsy was HER2 negative   1. Neoadjuvant chemotherapy with Herceptin  Perjeta  and letrozole  for 8 cycles.  (Our original recommendation is TCHP x6 cycles but patient is not willing to go through chemo) 2. left lumpectomy and ALND 09/20/2022: 0.6 cm IDC 2/14 lymph nodes, margins negative, ER 95%, PR 0%, HER2 positive 3. Followed by adjuvant radiation therapy  4.  Adjuvant antiestrogen therapy with neratinib She had left lumpectomy on 09/20/2022 with 2/14 LN's  PRECAUTIONS: left UE Lymphedema risk,  SUBJECTIVE: nothing new   PAIN:  Are you having pain? No  SOZO SCREENING: Patient was assessed today using the SOZO machine to determine the lymphedema index score. This was compared to her  baseline score. It was determined that she is within the recommended range when compared to her baseline and no further action is needed at this time. She will continue SOZO screenings. These are done every 3 months for 2 years post operatively followed by every 6 months for 2 years, and then annually.    Larue Saddie SAUNDERS, PT 05/19/2024, 5:06 PM

## 2024-05-20 ENCOUNTER — Other Ambulatory Visit: Payer: Self-pay

## 2024-07-26 LAB — SIGNATERA
SIGNATERA MTM READOUT: 0 MTM/ml
SIGNATERA TEST RESULT: NEGATIVE

## 2024-09-08 ENCOUNTER — Ambulatory Visit: Attending: Hematology and Oncology

## 2024-09-08 VITALS — Wt 123.0 lb

## 2024-09-08 DIAGNOSIS — Z483 Aftercare following surgery for neoplasm: Secondary | ICD-10-CM | POA: Insufficient documentation

## 2024-09-08 NOTE — Therapy (Signed)
 OUTPATIENT PHYSICAL THERAPY SOZO SCREENING NOTE   Patient Name: Caitlyn Torres MRN: [id number] DOB:Oct 22, 1961, 63 y.o., female Today's Date: 09/08/2024  PCP: No primary care provider on file. REFERRING PROVIDER: Odean Potts, MD   PT End of Session - 09/08/24 1612     Visit Number 25   # unchanged due to screen only   PT Start Time 1610    PT Stop Time 1614    PT Time Calculation (min) 4 min    Activity Tolerance Patient tolerated treatment well    Behavior During Therapy [facility] for tasks assessed/performed          Past Medical History:  Diagnosis Date   Anxiety    Arthritis    Cancer (HCC) 03/2022   L breast   Depression    Family history of breast cancer 05/15/2022   Family history of pancreatic cancer 05/15/2022   Family history of prostate cancer 05/15/2022   GERD (gastroesophageal reflux disease)    History of hiatal hernia    patient believes she may have been told she has this years ago   Past Surgical History:  Procedure Laterality Date   BREAST BIOPSY  09/19/2022   MM LT RADIOACTIVE SEED EA ADD LESION LOC MAMMO GUIDE 09/19/2022 [facility]MO[facility]   BREAST BIOPSY  09/19/2022   MM LT RADIOACTIVE SEED LOC MAMMO GUIDE 09/19/2022 [facility]MO[facility]   BREAST LUMPECTOMY WITH RADIOACTIVE SEED AND AXILLARY LYMPH NODE DISSECTION Left 09/20/2022   Procedure: LEFT BREAST SEED LUMPECTOMY, LEFT AXILLARY LYMPH NODE DISSECTION;  Surgeon: Vanderbilt Ned, MD;  Location: [facility];  Service: General;  Laterality: Left;  GEN & PEC BLOCK   CERVIX SURGERY     Laser removal of pre-cancer cells   LASIK Bilateral    PORTACATH PLACEMENT Right 04/20/2022   Procedure: PORT PLACEMENT;  Surgeon: Vanderbilt Ned, MD;  [facility]: [facility];  Service: General;  Laterality: Right;   Patient Active Problem List   Diagnosis Date Noted   Other fatigue 08/28/2022   Acute pain 08/28/2022   Genetic testing 06/01/2022   Family history of breast cancer 05/15/2022   Family history of pancreatic cancer  05/15/2022   Family history of prostate cancer 05/15/2022   Port-A-Cath in place 04/28/2022   Malignant neoplasm of upper-outer quadrant of left breast in female, estrogen receptor positive (HCC) 03/29/2022    REFERRING DIAG: left breast cancer at risk for lymphedema  THERAPY DIAG:  Aftercare following surgery for neoplasm  PERTINENT HISTORY: 03/14/2022:Screening detected left breast masses 1.5 cm and 1.3 cm with enlarged left axillary lymph node, biopsy revealed grade 3 IDC with lymph node being positive, ER 95%, PR 0%, HER2 2+ by IHC and FISH positive with a ratio 2.45 and a copy #4.9, the second biopsy was HER2 negative   1. Neoadjuvant chemotherapy with Herceptin  Perjeta  and letrozole  for 8 cycles.  (Our original recommendation is TCHP x6 cycles but patient is not willing to go through chemo) 2. left lumpectomy and ALND 09/20/2022: 0.6 cm IDC 2/14 lymph nodes, margins negative, ER 95%, PR 0%, HER2 positive 3. Followed by adjuvant radiation therapy  4.  Adjuvant antiestrogen therapy with neratinib She had left lumpectomy on 09/20/2022 with 2/14 LN's  PRECAUTIONS: left UE Lymphedema risk,  SUBJECTIVE: Pt returns for 3 month L-Dex screen.    PAIN:  Are you having pain? No  SOZO SCREENING: Patient was assessed today using the SOZO machine to determine the lymphedema index score. This was compared to her baseline score. It was determined  that she is within the recommended range when compared to her baseline and no further action is needed at this time. She will continue SOZO screenings. These are done every 3 months for 2 years post operatively followed by every 6 months for 2 years, and then annually.   L-DEX FLOWSHEETS - 09/08/24 1600       L-DEX LYMPHEDEMA SCREENING   Measurement Type Unilateral    L-DEX MEASUREMENT EXTREMITY Upper Extremity    POSITION  Standing    DOMINANT SIDE Right    At Risk Side Left    BASELINE SCORE (UNILATERAL) -2.1    L-DEX SCORE (UNILATERAL) -4.2     VALUE CHANGE (UNILAT) -2.1         P: Pt to transition to 6 month L-Dex screens until 09/2026 then annual.  Aden Berwyn Caldron, PTA 09/08/2024, 4:13 PM

## 2024-09-09 ENCOUNTER — Other Ambulatory Visit: Payer: Self-pay

## 2024-11-19 ENCOUNTER — Other Ambulatory Visit (HOSPITAL_BASED_OUTPATIENT_CLINIC_OR_DEPARTMENT_OTHER): Payer: Self-pay | Admitting: Family

## 2024-11-19 DIAGNOSIS — E785 Hyperlipidemia, unspecified: Secondary | ICD-10-CM

## 2024-11-27 ENCOUNTER — Ambulatory Visit
Admission: RE | Admit: 2024-11-27 | Discharge: 2024-11-27 | Disposition: A | Discharge status: Home / Self Care | Payer: Self-pay | Source: Ambulatory Visit | Attending: Family | Admitting: Family

## 2024-11-27 DIAGNOSIS — E785 Hyperlipidemia, unspecified: Secondary | ICD-10-CM | POA: Insufficient documentation

## 2025-03-09 ENCOUNTER — Ambulatory Visit: Attending: Hematology and Oncology
